# Patient Record
Sex: Male | Born: 1950
Health system: Southern US, Community
[De-identification: ages and names within clinical notes are randomized; demographics above are authoritative.]

## PROBLEM LIST (undated history)

## (undated) DIAGNOSIS — C61 Malignant neoplasm of prostate: Secondary | ICD-10-CM

## (undated) DIAGNOSIS — C7A8 Other malignant neuroendocrine tumors: Secondary | ICD-10-CM

## (undated) DIAGNOSIS — R931 Abnormal findings on diagnostic imaging of heart and coronary circulation: Secondary | ICD-10-CM

## (undated) DIAGNOSIS — I219 Acute myocardial infarction, unspecified: Secondary | ICD-10-CM

## (undated) DIAGNOSIS — C7B8 Other secondary neuroendocrine tumors: Secondary | ICD-10-CM

## (undated) DIAGNOSIS — I2699 Other pulmonary embolism without acute cor pulmonale: Secondary | ICD-10-CM

## (undated) DIAGNOSIS — E119 Type 2 diabetes mellitus without complications: Secondary | ICD-10-CM

## (undated) DIAGNOSIS — I119 Hypertensive heart disease without heart failure: Secondary | ICD-10-CM

## (undated) DIAGNOSIS — E785 Hyperlipidemia, unspecified: Secondary | ICD-10-CM

## (undated) DIAGNOSIS — E669 Obesity, unspecified: Secondary | ICD-10-CM

## (undated) DIAGNOSIS — K219 Gastro-esophageal reflux disease without esophagitis: Secondary | ICD-10-CM

## (undated) DIAGNOSIS — I493 Ventricular premature depolarization: Secondary | ICD-10-CM

## (undated) DIAGNOSIS — D696 Thrombocytopenia, unspecified: Secondary | ICD-10-CM

## (undated) DIAGNOSIS — I2510 Atherosclerotic heart disease of native coronary artery without angina pectoris: Secondary | ICD-10-CM

## (undated) DIAGNOSIS — Z91018 Allergy to other foods: Secondary | ICD-10-CM

## (undated) DIAGNOSIS — I739 Peripheral vascular disease, unspecified: Secondary | ICD-10-CM

## (undated) HISTORY — PX: APPENDECTOMY: SHX54

## (undated) HISTORY — DX: Hyperlipidemia, unspecified: E78.5

## (undated) HISTORY — PX: TRANSPERINEAL IMPLANT OF RADIATION SEEDS W/ ULTRASOUND: SUR1381

## (undated) HISTORY — DX: Acute myocardial infarction, unspecified: I21.9

## (undated) HISTORY — DX: Ventricular premature depolarization: I49.3

## (undated) HISTORY — DX: Allergy to other foods: Z91.018

## (undated) HISTORY — DX: Obesity, unspecified: E66.9

## (undated) HISTORY — DX: Abnormal findings on diagnostic imaging of heart and coronary circulation: R93.1

## (undated) HISTORY — DX: Peripheral vascular disease, unspecified: I73.9

## (undated) HISTORY — DX: Malignant neoplasm of prostate: C61

## (undated) HISTORY — DX: Type 2 diabetes mellitus without complications: E11.9

## (undated) HISTORY — DX: Hypertensive heart disease without heart failure: I11.9

## (undated) HISTORY — DX: Thrombocytopenia, unspecified: D69.6

---

## 1991-06-27 DIAGNOSIS — I219 Acute myocardial infarction, unspecified: Secondary | ICD-10-CM

## 1991-06-27 HISTORY — DX: Acute myocardial infarction, unspecified: I21.9

## 2010-07-12 ENCOUNTER — Ambulatory Visit
Admission: RE | Admit: 2010-07-12 | Discharge: 2010-07-25 | Payer: Self-pay | Source: Home / Self Care | Attending: Radiation Oncology | Admitting: Radiation Oncology

## 2010-07-27 ENCOUNTER — Ambulatory Visit: Payer: BC Managed Care – PPO | Admitting: Radiation Oncology

## 2010-08-15 ENCOUNTER — Ambulatory Visit: Payer: BC Managed Care – PPO | Attending: Radiation Oncology | Admitting: Radiation Oncology

## 2010-08-15 DIAGNOSIS — C61 Malignant neoplasm of prostate: Secondary | ICD-10-CM | POA: Insufficient documentation

## 2010-08-15 DIAGNOSIS — Z510 Encounter for antineoplastic radiation therapy: Secondary | ICD-10-CM | POA: Insufficient documentation

## 2010-09-02 ENCOUNTER — Other Ambulatory Visit: Payer: Self-pay | Admitting: Urology

## 2010-09-02 ENCOUNTER — Ambulatory Visit
Admission: RE | Admit: 2010-09-02 | Discharge: 2010-09-02 | Disposition: A | Discharge status: Home / Self Care | Payer: BC Managed Care – PPO | Source: Ambulatory Visit | Attending: Urology | Admitting: Urology

## 2010-09-02 DIAGNOSIS — Z01811 Encounter for preprocedural respiratory examination: Secondary | ICD-10-CM

## 2010-09-02 DIAGNOSIS — C61 Malignant neoplasm of prostate: Secondary | ICD-10-CM

## 2010-10-03 LAB — PROTIME-INR
INR: 0.93 (ref 0.00–1.49)
Prothrombin Time: 12.7 seconds (ref 11.6–15.2)

## 2010-10-03 LAB — COMPREHENSIVE METABOLIC PANEL
ALT: 33 U/L (ref 0–53)
BUN: 12 mg/dL (ref 6–23)
CO2: 25 mEq/L (ref 19–32)
Calcium: 9.2 mg/dL (ref 8.4–10.5)
Creatinine, Ser: 0.9 mg/dL (ref 0.4–1.5)
GFR calc non Af Amer: >60 mL/min (ref >60)
Glucose, Bld: 236 mg/dL — ABNORMAL HIGH (ref 70–99)
Sodium: 137 mEq/L (ref 135–145)
Total Protein: 6.2 g/dL (ref 6.0–8.3)

## 2010-10-03 LAB — CBC
Hemoglobin: 15.2 g/dL (ref 13.0–17.0)
MCHC: 35.6 g/dL (ref 30.0–36.0)
Platelets: 133 10*3/uL — ABNORMAL LOW (ref 150–400)
RBC: 4.94 MIL/uL (ref 4.22–5.81)

## 2010-10-10 ENCOUNTER — Ambulatory Visit (HOSPITAL_BASED_OUTPATIENT_CLINIC_OR_DEPARTMENT_OTHER)
Admission: RE | Admit: 2010-10-10 | Discharge: 2010-10-10 | Disposition: A | Discharge status: Home / Self Care | Payer: BC Managed Care – PPO | Source: Ambulatory Visit | Attending: Urology | Admitting: Urology

## 2010-10-10 ENCOUNTER — Ambulatory Visit (HOSPITAL_COMMUNITY): Payer: BC Managed Care – PPO | Attending: Urology

## 2010-10-10 DIAGNOSIS — Z79899 Other long term (current) drug therapy: Secondary | ICD-10-CM | POA: Insufficient documentation

## 2010-10-10 DIAGNOSIS — I2510 Atherosclerotic heart disease of native coronary artery without angina pectoris: Secondary | ICD-10-CM | POA: Insufficient documentation

## 2010-10-10 DIAGNOSIS — E119 Type 2 diabetes mellitus without complications: Secondary | ICD-10-CM | POA: Insufficient documentation

## 2010-10-10 DIAGNOSIS — Z01812 Encounter for preprocedural laboratory examination: Secondary | ICD-10-CM | POA: Insufficient documentation

## 2010-10-10 DIAGNOSIS — C61 Malignant neoplasm of prostate: Secondary | ICD-10-CM | POA: Insufficient documentation

## 2010-10-10 DIAGNOSIS — I10 Essential (primary) hypertension: Secondary | ICD-10-CM | POA: Insufficient documentation

## 2010-10-10 LAB — GLUCOSE, CAPILLARY: Glucose-Capillary: 252 mg/dL — ABNORMAL HIGH (ref 70–99)

## 2010-10-19 ENCOUNTER — Ambulatory Visit: Payer: BC Managed Care – PPO | Attending: Radiation Oncology | Admitting: Radiation Oncology

## 2010-10-19 DIAGNOSIS — C61 Malignant neoplasm of prostate: Secondary | ICD-10-CM | POA: Insufficient documentation

## 2010-10-19 NOTE — Op Note (Signed)
NAME:  Daniel Gibson, DUCE NO.:  [id number]  MEDICAL RECORD NO.:  [id number]           PATIENT TYPE:  LOCATION:                                 FACILITY:  PHYSICIAN:  Valetta Fuller, M.D.  DATE OF BIRTH:  30-Mar-1951  DATE OF PROCEDURE: DATE OF DISCHARGE:                              OPERATIVE REPORT   PREOPERATIVE DIAGNOSIS:  Adenocarcinoma of the prostate.  POSTOPERATIVE DIAGNOSIS:  Adenocarcinoma of the prostate.  PROCEDURE PERFORMED: 1. Robotic nuclear drawn I-125 prostate seed implantation. 2. Flexible cystoscopy.  SURGEONS:  Valetta Fuller, M.D. and Maryln Gottron, M.D.  ANESTHESIA:  General.  INDICATIONS:  Mr. Fussell is 60 years of age.  The patient was diagnosed with what was felt to be an intermediate risk to favorable clinical stage T1c adenocarcinoma of the prostate.  The patient's PSA was 4.4. Rectal exam was unremarkable.  Ultrasound revealed a 50-gm prostate.  On the right side, 6 out of 6 biopsies were positive for Gleason 3+3 equals 6 prostate cancer.  Core involvement was in the 50% to 70% range.  The patient's biopsies on the left side were all negative.  The patient underwent extensive consultation with Dr. Dayton Scrape with regard to treatment options.  The patient elected to proceed with external beam radiation approach.  The patient was given Deborra Medina for prostate downsizing given his 50-gm prostate because of some concerns about his pubic arch.  The patient has been treated with external ring radiation therapy and presents today for seed boost.  The patient has had placement of compression boots and has received perioperative ciprofloxacin.  The patient appears to understand the advantages and disadvantages of this surgery and full informed consent has been obtained.  TECHNIQUE AND FINDINGS:  The patient was brought to the operating room where he had successful induction of general anesthesia.  He was placed in lithotomy position and  was under the initial care of the Radiation Oncology Department.  Foley catheter was inserted.  Transrectal ultrasound probe placed with an anchoring stand and then anchoring needles.  Real time contouring of the prostate, urethra and rectum were then performed and a real time dose established.  We then came in the operating room and additional appropriate surgical time-out was performed.  All needle passage was done with real time sagittal transrectal ultrasound imaging.  A total of 23 needles were inserted.  Seed implantation was then done via the nuclear drawn robotic seed implanter device without incident.  A total number of 67 activated seeds was implanted with target dose of 110 gray.  At the completion of the procedure, fluoroscopic images appeared to show a nice distribution of the seeds without any obvious problems.  The Foley catheter was removed and flexible cystoscopy was performed.  We saw no evidence of seeds within the prostatic urethra or bladder.  A new Foley catheter was inserted which drained clear urine.  The patient was brought to recovery room, having had no obvious complications or problems.     Valetta Fuller, M.D.     DSG/MEDQ  D:  10/10/2010  T:  10/10/2010  Job:  [id number]  Electronically Signed by Barron Alvine M.D. on 10/19/2010 11:24:22 AM

## 2010-12-13 ENCOUNTER — Other Ambulatory Visit: Payer: Self-pay | Admitting: Family Medicine

## 2010-12-13 DIAGNOSIS — R0989 Other specified symptoms and signs involving the circulatory and respiratory systems: Secondary | ICD-10-CM

## 2010-12-16 ENCOUNTER — Ambulatory Visit
Admission: RE | Admit: 2010-12-16 | Discharge: 2010-12-16 | Disposition: A | Discharge status: Home / Self Care | Payer: BC Managed Care – PPO | Source: Ambulatory Visit | Attending: Family Medicine | Admitting: Family Medicine

## 2010-12-16 DIAGNOSIS — R0989 Other specified symptoms and signs involving the circulatory and respiratory systems: Secondary | ICD-10-CM

## 2011-11-24 ENCOUNTER — Other Ambulatory Visit: Payer: Self-pay | Admitting: Family Medicine

## 2011-11-24 DIAGNOSIS — I6529 Occlusion and stenosis of unspecified carotid artery: Secondary | ICD-10-CM

## 2011-11-27 ENCOUNTER — Other Ambulatory Visit: Payer: BC Managed Care – PPO

## 2012-03-28 ENCOUNTER — Ambulatory Visit
Admission: RE | Admit: 2012-03-28 | Discharge: 2012-03-28 | Disposition: A | Discharge status: Home / Self Care | Payer: No Typology Code available for payment source | Source: Ambulatory Visit | Attending: Family Medicine | Admitting: Family Medicine

## 2012-03-28 DIAGNOSIS — I6529 Occlusion and stenosis of unspecified carotid artery: Secondary | ICD-10-CM

## 2012-04-09 ENCOUNTER — Other Ambulatory Visit: Payer: Self-pay

## 2012-04-09 DIAGNOSIS — I6529 Occlusion and stenosis of unspecified carotid artery: Secondary | ICD-10-CM

## 2012-04-26 ENCOUNTER — Encounter: Payer: Self-pay | Admitting: Vascular Surgery

## 2012-05-07 ENCOUNTER — Encounter: Payer: Self-pay | Admitting: Vascular Surgery

## 2012-05-08 ENCOUNTER — Other Ambulatory Visit (INDEPENDENT_AMBULATORY_CARE_PROVIDER_SITE_OTHER): Payer: PRIVATE HEALTH INSURANCE | Admitting: *Deleted

## 2012-05-08 ENCOUNTER — Ambulatory Visit (INDEPENDENT_AMBULATORY_CARE_PROVIDER_SITE_OTHER): Payer: PRIVATE HEALTH INSURANCE | Admitting: Vascular Surgery

## 2012-05-08 ENCOUNTER — Encounter: Payer: Self-pay | Admitting: Vascular Surgery

## 2012-05-08 VITALS — BP 167/86 | HR 66 | Ht 70.0 in | Wt 224.0 lb

## 2012-05-08 DIAGNOSIS — I6529 Occlusion and stenosis of unspecified carotid artery: Secondary | ICD-10-CM

## 2012-05-08 DIAGNOSIS — I779 Disorder of arteries and arterioles, unspecified: Secondary | ICD-10-CM | POA: Insufficient documentation

## 2012-05-08 HISTORY — DX: Disorder of arteries and arterioles, unspecified: I77.9

## 2012-05-08 NOTE — Assessment & Plan Note (Signed)
This patient has a greater than 80% asymptomatic right carotid stenosis. I have recommended right carotid endarterectomy in order to lower his risk of future stroke. I have reviewed the indications for carotid endarterectomy.  I have also reviewed the potential complications of surgery, including but not limited to: bleeding, stroke (perioperative risk 1-2%), MI, nerve injury of other unpredictable medical problems. All of the patients questions were answered and they are agreeable to proceed with surgery. The patient is on a statin and is also on aspirin. The surgery is scheduled for 06/07/2012.

## 2012-05-08 NOTE — Progress Notes (Signed)
[facility]  Patient name: Daniel Gibson MRN: [id number] DOB: 10/29/1950 Sex: male  CC: asymptomatic greater than 80% right carotid stenosis. Referred by Dr. Maura Hamrick  HPI: Daniel Gibson is a 61 y.o. male who was found to have a right carotid stenosis on a routine carotid duplex scan approximately 1 year ago.he had a follow up study by [facility] on 03/28/2012. This showed progression of the right carotid stenosis which was now in the 70-99% range with a less than 50% left carotid stenosis. He was sent for vascular consultation. He is right-handed. He denies any history of stroke, TIAs, expressive or receptive aphasia, or amaurosis fugax. He is on aspirin and also a statin. He has a strong family history of cerebral vascular disease.  Past Medical History  Diagnosis Date  . Diabetes mellitus without complication   . Carotid artery occlusion   . Cancer     prostate  . CAD (coronary artery disease)   . Hyperlipidemia   . Hypertension   . Thrombocytopenia   . Myocardial infarction     Family History  Problem Relation Age of Onset  . Cancer Mother   . Heart attack Father   . Diabetes Sister   . Heart disease Sister     before age 60  . Hypertension Sister   . Diabetes Brother   . Heart disease Brother     before age 60  . Hyperlipidemia Brother   . Hypertension Brother     SOCIAL HISTORY: History  Substance Use Topics  . Smoking status: Never Smoker   . Smokeless tobacco: Never Used  . Alcohol Use: No    Allergies  Allergen Reactions  . Tetanus Toxoids     Current Outpatient Prescriptions  Medication Sig Dispense Refill  . amLODipine (NORVASC) 10 MG tablet Take 10 mg by mouth daily.      . aspirin 325 MG tablet Take 325 mg by mouth daily.      . carvedilol (COREG) 25 MG tablet Take 12.5 mg by mouth 2 (two) times daily with a meal.      . fluocinonide (LIDEX) 0.05 % external solution Apply topically 2 (two) times daily.  When itching      . metFORMIN (GLUCOPHAGE) 1000 MG tablet Take 1,000 mg by mouth 2 (two) times daily with a meal.      . Niacin-Simvastatin 1000-40 MG TB24 Take by mouth.      . nitroGLYCERIN (NITROSTAT) 0.4 MG SL tablet Place 0.4 mg under the tongue every 5 (five) minutes as needed.      . omega-3 acid ethyl esters (LOVAZA) 1 G capsule Take 2 g by mouth 2 (two) times daily.      . oxybutynin (DITROPAN-XL) 10 MG 24 hr tablet Take 10 mg by mouth daily.      . ramipril (ALTACE) 10 MG capsule Take 10 mg by mouth daily.      . zolpidem (AMBIEN) 10 MG tablet Take 10 mg by mouth at bedtime as needed.        REVIEW OF SYSTEMS: [X ] denotes positive finding; [  ] denotes negative finding CARDIOVASCULAR:  [ ] chest pain   [ ] chest pressure   [ ] palpitations   [ ] orthopnea   [ ] dyspnea on exertion   [ ] claudication   [ ] rest pain   [ ] DVT   [ ] phlebitis PULMONARY:   [ ] productive cough   [ ]   asthma   [ ] wheezing NEUROLOGIC:   [ ] weakness  [ ] paresthesias  [ ] aphasia  [ ] amaurosis  [ ] dizziness HEMATOLOGIC:   [ ] bleeding problems   [ ] clotting disorders MUSCULOSKELETAL:  [ ] joint pain   [ ] joint swelling [ ] leg swelling GASTROINTESTINAL: [ ]  blood in stool  [ ]  hematemesis GENITOURINARY:  [ ]  dysuria  [ ]  hematuria PSYCHIATRIC:  [ ] history of major depression INTEGUMENTARY:  [ ] rashes  [ ] ulcers CONSTITUTIONAL:  [ ] fever   [ ] chills  PHYSICAL EXAM: Filed Vitals:   05/08/12 0926  BP: 167/86  Pulse: 66  Height: 5' 10" (1.778 m)  Weight: 224 lb (101.606 kg)  SpO2: 100%   Body mass index is 32.14 kg/(m^2). GENERAL: The patient is a well-nourished male, in no acute distress. The vital signs are documented above. CARDIOVASCULAR: There is a regular rate and rhythm without significant murmur appreciated. He has a right carotid bruit. He has palpable femoral, popliteal, and pedal pulses bilaterally. He has no significant lower extremity swelling. PULMONARY: There is good  air exchange bilaterally without wheezing or rales. ABDOMEN: Soft and non-tender with normal pitched bowel sounds. I do not appreciate an abdominal aortic aneurysm although his abdomen is obese and difficult to assess. MUSCULOSKELETAL: There are no major deformities or cyanosis. NEUROLOGIC: No focal weakness or paresthesias are detected. SKIN: There are no ulcers or rashes noted. PSYCHIATRIC: The patient has a normal affect.  DATA:  I have reviewed his carotid duplex scan from [facility] which shows a 70-99% right carotid stenosis with a less than 50% left carotid stenosis. There is normal he directed flow in both vertebral arteries.  I [facility]. He has type 2 diabetes without any significant complicating features. He has benign essential hypertension and hyperlipidemia. In addition he has a history of thrombocytopenia.  I have independently interpreted her carotid duplex scan of the right carotid artery in our office which shows a greater than 80% right carotid stenosis. The ICA be on the stenosis is tortuous but no disease is noted.  MEDICAL ISSUES:  Occlusion and stenosis of carotid artery without mention of cerebral infarction This patient has a greater than 80% asymptomatic right carotid stenosis. I have recommended right carotid endarterectomy in order to lower his risk of future stroke. I have reviewed the indications for carotid endarterectomy.  I have also reviewed the potential complications of surgery, including but not limited to: bleeding, stroke (perioperative risk 1-2%), MI, nerve injury of other unpredictable medical problems. All of the patients questions were answered and they are agreeable to proceed with surgery. The patient is on a statin and is also on aspirin. The surgery is scheduled for 06/07/2012.    Herminia Warren S [facility] Office: [phone]   

## 2012-05-17 ENCOUNTER — Other Ambulatory Visit: Payer: Self-pay | Admitting: *Deleted

## 2012-05-31 ENCOUNTER — Encounter (HOSPITAL_COMMUNITY): Payer: Self-pay

## 2012-05-31 ENCOUNTER — Ambulatory Visit (HOSPITAL_COMMUNITY)
Admission: RE | Admit: 2012-05-31 | Discharge: 2012-05-31 | Disposition: A | Discharge status: Home / Self Care | Payer: PRIVATE HEALTH INSURANCE | Source: Ambulatory Visit | Attending: Anesthesiology | Admitting: Anesthesiology

## 2012-05-31 ENCOUNTER — Encounter (HOSPITAL_COMMUNITY)
Admission: RE | Admit: 2012-05-31 | Discharge: 2012-05-31 | Disposition: A | Discharge status: Home / Self Care | Payer: PRIVATE HEALTH INSURANCE | Source: Ambulatory Visit | Attending: Vascular Surgery | Admitting: Vascular Surgery

## 2012-05-31 ENCOUNTER — Encounter (HOSPITAL_COMMUNITY): Payer: Self-pay | Admitting: Pharmacy Technician

## 2012-05-31 DIAGNOSIS — Z01818 Encounter for other preprocedural examination: Secondary | ICD-10-CM | POA: Insufficient documentation

## 2012-05-31 LAB — CBC
HCT: 41.6 % (ref 39.0–52.0)
Hemoglobin: 15.4 g/dL (ref 13.0–17.0)
MCV: 85.8 fL (ref 78.0–100.0)
RBC: 4.85 MIL/uL (ref 4.22–5.81)
RDW: 12.5 % (ref 11.5–15.5)
WBC: 8.3 10*3/uL (ref 4.0–10.5)

## 2012-05-31 LAB — URINALYSIS, ROUTINE W REFLEX MICROSCOPIC
Glucose, UA: NEGATIVE mg/dL
Hgb urine dipstick: NEGATIVE
Protein, ur: NEGATIVE mg/dL
Urobilinogen, UA: 1 mg/dL (ref 0.0–1.0)

## 2012-05-31 LAB — COMPREHENSIVE METABOLIC PANEL
Albumin: 4.1 g/dL (ref 3.5–5.2)
Alkaline Phosphatase: 66 U/L (ref 39–117)
BUN: 11 mg/dL (ref 6–23)
CO2: 24 mEq/L (ref 19–32)
Chloride: 106 mEq/L (ref 96–112)
Creatinine, Ser: 0.88 mg/dL (ref 0.50–1.35)
GFR calc non Af Amer: >90 mL/min (ref >90)
Glucose, Bld: 113 mg/dL — ABNORMAL HIGH (ref 70–99)
Potassium: 3.8 mEq/L (ref 3.5–5.1)
Total Bilirubin: 0.6 mg/dL (ref 0.3–1.2)

## 2012-05-31 LAB — ABO/RH: ABO/RH(D): A POS

## 2012-05-31 LAB — APTT: aPTT: 26 seconds (ref 24–37)

## 2012-05-31 LAB — TYPE AND SCREEN
ABO/RH(D): A POS
Antibody Screen: NEGATIVE

## 2012-05-31 LAB — SURGICAL PCR SCREEN: Staphylococcus aureus: NEGATIVE

## 2012-05-31 NOTE — Progress Notes (Signed)
Requested last OV, ECHO and stress test if available, from Dr. York Spaniel office.

## 2012-05-31 NOTE — Progress Notes (Signed)
05/31/12 1326  OBSTRUCTIVE SLEEP APNEA  Have you ever been diagnosed with sleep apnea through a sleep study? No  Do you snore loudly (loud enough to be heard through closed doors)?  0  Do you often feel tired, fatigued, or sleepy during the daytime? 0  Has anyone observed you stop breathing during your sleep? 1  Do you have, or are you being treated for high blood pressure? 1  BMI more than 35 kg/m2? 0  Age over 60 years old? 1  Neck circumference greater than 40 cm/18 inches? 0  Gender: 1  Obstructive Sleep Apnea Score 4

## 2012-05-31 NOTE — Pre-Procedure Instructions (Signed)
[address] Vancott  05/31/2012   Your procedure is scheduled on:  Friday December 13  Report to [facility] at 5:30 AM.  Call this number if you have problems the morning of surgery: [phone]   Remember:   Do not eat or drink:After Midnight.    Take these medicines the morning of surgery with A SIP OF WATER: Amlodipine (Norvasc), Carvedilol (Coreg), Oxybutynin (Ditropan). Bring Nitro to surgery.   Do not wear jewelry, make-up or nail polish.  Do not wear lotions, powders, or perfumes. You may wear deodorant.  Do not shave 48 hours prior to surgery. Men may shave face and neck.  Do not bring valuables to the hospital.  Contacts, dentures or bridgework may not be worn into surgery.  Leave suitcase in the car. After surgery it may be brought to your room.  For patients admitted to the hospital, checkout time is 11:00 AM the day of discharge.   Patients discharged the day of surgery will not be allowed to drive home.  Name and phone number of your driver: NA  Special Instructions: Shower using CHG 2 nights before surgery and the night before surgery.  If you shower the day of surgery use CHG.  Use special wash - you have one bottle of CHG for all showers.  You should use approximately 1/3 of the bottle for each shower.   Please read over the following fact sheets that you were given: Pain Booklet, Coughing and Deep Breathing, Blood Transfusion Information and Surgical Site Infection Prevention

## 2012-06-03 NOTE — Consult Note (Signed)
Anesthesia chart review: Patient is a 61 year old male scheduled for right carotid endarterectomy by Dr. Edilia Bo on 06/07/2012. History includes diabetes mellitus type 2, hyperlipidemia, hypertension, thrombocytopenia, CAD with MI '93 managed medically, prostate cancer status post radioactive seed implant 09/2010. PCP is Dr. Burnell Blanks.  Preoperative labs noted. Platelet count 129 (stable since 10/03/2010).  Chest x-ray in the summer 12/14/2011 showed no active cardiopulmonary disease.  EKG on 05/31/2012 showed normal sinus rhythm.  Records reviewed from Cardiologist Dr. Donnie Aho.  Last records are from 09/2010.  He had a negative adequate exercise test with no evidence of myocardial ischemia on 10/18/2010, achieved 9 METS.  His EKG is WNL, and he had a normal ETT within the past two years.  If no new CV symptoms then anticipate he can proceed as planned.  Anesthesiologist Dr. Noreene Larsson agrees with this plan.  Shonna Chock, PA-C 06/03/12 1418

## 2012-06-06 ENCOUNTER — Other Ambulatory Visit: Payer: Self-pay

## 2012-06-06 MED ORDER — DEXTROSE 5 % IV SOLN
1.5000 g | INTRAVENOUS | Status: AC
  Administered 2012-06-07: 1.5 g via INTRAVENOUS
  Filled 2012-06-06: qty 1.5

## 2012-06-07 ENCOUNTER — Encounter (HOSPITAL_COMMUNITY): Payer: Self-pay | Admitting: Vascular Surgery

## 2012-06-07 ENCOUNTER — Inpatient Hospital Stay (HOSPITAL_COMMUNITY)
Admission: RE | Admit: 2012-06-07 | Discharge: 2012-06-08 | DRG: 039 | Disposition: A | Discharge status: Home / Self Care | Payer: PRIVATE HEALTH INSURANCE | Source: Ambulatory Visit | Attending: Vascular Surgery | Admitting: Vascular Surgery

## 2012-06-07 ENCOUNTER — Ambulatory Visit (HOSPITAL_COMMUNITY): Payer: PRIVATE HEALTH INSURANCE | Admitting: Vascular Surgery

## 2012-06-07 ENCOUNTER — Encounter (HOSPITAL_COMMUNITY)
Admission: RE | Disposition: A | Discharge status: Home / Self Care | Payer: Self-pay | Source: Ambulatory Visit | Attending: Vascular Surgery

## 2012-06-07 ENCOUNTER — Telehealth: Payer: Self-pay | Admitting: Vascular Surgery

## 2012-06-07 ENCOUNTER — Encounter (HOSPITAL_COMMUNITY): Payer: Self-pay

## 2012-06-07 DIAGNOSIS — Z7982 Long term (current) use of aspirin: Secondary | ICD-10-CM

## 2012-06-07 DIAGNOSIS — I6529 Occlusion and stenosis of unspecified carotid artery: Secondary | ICD-10-CM

## 2012-06-07 DIAGNOSIS — I739 Peripheral vascular disease, unspecified: Secondary | ICD-10-CM | POA: Diagnosis present

## 2012-06-07 DIAGNOSIS — Z8546 Personal history of malignant neoplasm of prostate: Secondary | ICD-10-CM

## 2012-06-07 DIAGNOSIS — Z79899 Other long term (current) drug therapy: Secondary | ICD-10-CM

## 2012-06-07 DIAGNOSIS — I2510 Atherosclerotic heart disease of native coronary artery without angina pectoris: Secondary | ICD-10-CM | POA: Diagnosis present

## 2012-06-07 DIAGNOSIS — I10 Essential (primary) hypertension: Secondary | ICD-10-CM | POA: Diagnosis present

## 2012-06-07 DIAGNOSIS — E119 Type 2 diabetes mellitus without complications: Secondary | ICD-10-CM | POA: Diagnosis present

## 2012-06-07 DIAGNOSIS — I252 Old myocardial infarction: Secondary | ICD-10-CM

## 2012-06-07 HISTORY — PX: ENDARTERECTOMY: SHX5162

## 2012-06-07 HISTORY — PX: CAROTID ENDARTERECTOMY: SUR193

## 2012-06-07 LAB — GLUCOSE, CAPILLARY
Glucose-Capillary: 176 mg/dL — ABNORMAL HIGH (ref 70–99)
Glucose-Capillary: 256 mg/dL — ABNORMAL HIGH (ref 70–99)

## 2012-06-07 SURGERY — ENDARTERECTOMY, CAROTID
Anesthesia: General | Site: Neck | Laterality: Right | Wound class: Clean

## 2012-06-07 MED ORDER — NEOSTIGMINE METHYLSULFATE 1 MG/ML IJ SOLN
INTRAMUSCULAR | Status: DC | PRN
  Administered 2012-06-07: 2 mg via INTRAVENOUS

## 2012-06-07 MED ORDER — FENTANYL CITRATE 0.05 MG/ML IJ SOLN
INTRAMUSCULAR | Status: AC
  Filled 2012-06-07: qty 2

## 2012-06-07 MED ORDER — EPHEDRINE SULFATE 50 MG/ML IJ SOLN
INTRAMUSCULAR | Status: DC | PRN
  Administered 2012-06-07 (×2): 2.5 mg via INTRAVENOUS

## 2012-06-07 MED ORDER — DOCUSATE SODIUM 100 MG PO CAPS
100.0000 mg | ORAL_CAPSULE | Freq: Every day | ORAL | Status: DC
  Administered 2012-06-08: 100 mg via ORAL
  Filled 2012-06-07: qty 1

## 2012-06-07 MED ORDER — PHENOL 1.4 % MT LIQD: 1.0000 | OROMUCOSAL | Status: DC | PRN

## 2012-06-07 MED ORDER — LABETALOL HCL 5 MG/ML IV SOLN: 10.0000 mg | INTRAVENOUS | Status: DC | PRN

## 2012-06-07 MED ORDER — SODIUM CHLORIDE 0.9 % IV SOLN: 500.0000 mL | Freq: Once | INTRAVENOUS | Status: AC | PRN

## 2012-06-07 MED ORDER — LIDOCAINE HCL (CARDIAC) 20 MG/ML IV SOLN
INTRAVENOUS | Status: DC | PRN
  Administered 2012-06-07: 100 mg via INTRAVENOUS

## 2012-06-07 MED ORDER — PROTAMINE SULFATE 10 MG/ML IV SOLN
INTRAVENOUS | Status: DC | PRN
  Administered 2012-06-07: 40 mg via INTRAVENOUS

## 2012-06-07 MED ORDER — ROCURONIUM BROMIDE 100 MG/10ML IV SOLN
INTRAVENOUS | Status: DC | PRN
  Administered 2012-06-07: 50 mg via INTRAVENOUS

## 2012-06-07 MED ORDER — PANTOPRAZOLE SODIUM 40 MG PO TBEC
40.0000 mg | DELAYED_RELEASE_TABLET | Freq: Every day | ORAL | Status: DC
  Administered 2012-06-08: 40 mg via ORAL
  Filled 2012-06-07: qty 1

## 2012-06-07 MED ORDER — MORPHINE SULFATE 2 MG/ML IJ SOLN: 2.0000 mg | INTRAMUSCULAR | Status: DC | PRN

## 2012-06-07 MED ORDER — FENTANYL CITRATE 0.05 MG/ML IJ SOLN
25.0000 ug | INTRAMUSCULAR | Status: DC | PRN
  Administered 2012-06-07 (×2): 50 ug via INTRAVENOUS

## 2012-06-07 MED ORDER — ASPIRIN 325 MG PO TABS
325.0000 mg | ORAL_TABLET | Freq: Every day | ORAL | Status: DC
  Filled 2012-06-07: qty 1

## 2012-06-07 MED ORDER — SODIUM CHLORIDE 0.9 % IV SOLN: INTRAVENOUS | Status: DC

## 2012-06-07 MED ORDER — INSULIN ASPART 100 UNIT/ML ~~LOC~~ SOLN
0.0000 [IU] | Freq: Three times a day (TID) | SUBCUTANEOUS | Status: DC
  Administered 2012-06-07: 8 [IU] via SUBCUTANEOUS
  Administered 2012-06-08: 5 [IU] via SUBCUTANEOUS

## 2012-06-07 MED ORDER — CARVEDILOL 12.5 MG PO TABS
12.5000 mg | ORAL_TABLET | Freq: Two times a day (BID) | ORAL | Status: DC
  Administered 2012-06-08: 12.5 mg via ORAL
  Filled 2012-06-07 (×4): qty 1

## 2012-06-07 MED ORDER — SODIUM CHLORIDE 0.9 % IV SOLN
INTRAVENOUS | Status: DC
  Administered 2012-06-07 (×2): via INTRAVENOUS

## 2012-06-07 MED ORDER — ACETAMINOPHEN 650 MG RE SUPP: 325.0000 mg | RECTAL | Status: DC | PRN

## 2012-06-07 MED ORDER — GUAIFENESIN-DM 100-10 MG/5ML PO SYRP: 15.0000 mL | ORAL_SOLUTION | ORAL | Status: DC | PRN

## 2012-06-07 MED ORDER — METOCLOPRAMIDE HCL 5 MG/ML IJ SOLN: 10.0000 mg | Freq: Once | INTRAMUSCULAR | Status: DC | PRN

## 2012-06-07 MED ORDER — ATORVASTATIN CALCIUM 40 MG PO TABS
40.0000 mg | ORAL_TABLET | Freq: Every day | ORAL | Status: DC
  Administered 2012-06-07: 40 mg via ORAL
  Filled 2012-06-07 (×2): qty 1

## 2012-06-07 MED ORDER — POTASSIUM CHLORIDE CRYS ER 20 MEQ PO TBCR: 20.0000 meq | EXTENDED_RELEASE_TABLET | Freq: Every day | ORAL | Status: DC | PRN

## 2012-06-07 MED ORDER — THROMBIN 20000 UNITS EX SOLR
CUTANEOUS | Status: AC
  Filled 2012-06-07: qty 20000

## 2012-06-07 MED ORDER — SODIUM CHLORIDE 0.9 % IR SOLN
Status: DC | PRN
  Administered 2012-06-07: 08:00:00

## 2012-06-07 MED ORDER — LACTATED RINGERS IV SOLN
INTRAVENOUS | Status: DC | PRN
  Administered 2012-06-07 (×2): via INTRAVENOUS

## 2012-06-07 MED ORDER — LIDOCAINE HCL (PF) 1 % IJ SOLN
INTRAMUSCULAR | Status: AC
  Filled 2012-06-07: qty 30

## 2012-06-07 MED ORDER — HYDRALAZINE HCL 20 MG/ML IJ SOLN: 10.0000 mg | INTRAMUSCULAR | Status: DC | PRN

## 2012-06-07 MED ORDER — DOPAMINE-DEXTROSE 3.2-5 MG/ML-% IV SOLN: 3.0000 ug/kg/min | INTRAVENOUS | Status: DC | PRN

## 2012-06-07 MED ORDER — ALUM & MAG HYDROXIDE-SIMETH 200-200-20 MG/5ML PO SUSP: 15.0000 mL | ORAL | Status: DC | PRN

## 2012-06-07 MED ORDER — 0.9 % SODIUM CHLORIDE (POUR BTL) OPTIME
TOPICAL | Status: DC | PRN
  Administered 2012-06-07: 2000 mL

## 2012-06-07 MED ORDER — ONDANSETRON HCL 4 MG/2ML IJ SOLN: 4.0000 mg | Freq: Four times a day (QID) | INTRAMUSCULAR | Status: DC | PRN

## 2012-06-07 MED ORDER — OXYCODONE-ACETAMINOPHEN 5-325 MG PO TABS: 1.0000 | ORAL_TABLET | ORAL | Status: DC | PRN

## 2012-06-07 MED ORDER — PROPOFOL 10 MG/ML IV BOLUS
INTRAVENOUS | Status: DC | PRN
  Administered 2012-06-07: 200 mg via INTRAVENOUS

## 2012-06-07 MED ORDER — METOPROLOL TARTRATE 1 MG/ML IV SOLN: 2.0000 mg | INTRAVENOUS | Status: DC | PRN

## 2012-06-07 MED ORDER — LIDOCAINE-EPINEPHRINE (PF) 1 %-1:200000 IJ SOLN: INTRAMUSCULAR | Status: DC | PRN

## 2012-06-07 MED ORDER — AMLODIPINE BESYLATE 10 MG PO TABS
10.0000 mg | ORAL_TABLET | Freq: Every day | ORAL | Status: DC
  Administered 2012-06-08: 10 mg via ORAL
  Filled 2012-06-07: qty 1

## 2012-06-07 MED ORDER — FENTANYL CITRATE 0.05 MG/ML IJ SOLN
INTRAMUSCULAR | Status: DC | PRN
  Administered 2012-06-07 (×2): 25 ug via INTRAVENOUS
  Administered 2012-06-07: 50 ug via INTRAVENOUS

## 2012-06-07 MED ORDER — DEXTROSE 5 % IV SOLN
1.5000 g | Freq: Two times a day (BID) | INTRAVENOUS | Status: DC
  Administered 2012-06-07: 1.5 g via INTRAVENOUS
  Filled 2012-06-07 (×2): qty 1.5

## 2012-06-07 MED ORDER — MIDAZOLAM HCL 5 MG/5ML IJ SOLN
INTRAMUSCULAR | Status: DC | PRN
  Administered 2012-06-07: 1 mg via INTRAVENOUS

## 2012-06-07 MED ORDER — DEXTRAN 40 IN SALINE 10-0.9 % IV SOLN
INTRAVENOUS | Status: AC
  Filled 2012-06-07: qty 500

## 2012-06-07 MED ORDER — ONDANSETRON HCL 4 MG/2ML IJ SOLN
INTRAMUSCULAR | Status: DC | PRN
  Administered 2012-06-07 (×2): 4 mg via INTRAVENOUS

## 2012-06-07 MED ORDER — DEXAMETHASONE SODIUM PHOSPHATE 4 MG/ML IJ SOLN
INTRAMUSCULAR | Status: DC | PRN
  Administered 2012-06-07: 8 mg via INTRAVENOUS

## 2012-06-07 MED ORDER — GLYCOPYRROLATE 0.2 MG/ML IJ SOLN
INTRAMUSCULAR | Status: DC | PRN
  Administered 2012-06-07: .3 mg via INTRAVENOUS

## 2012-06-07 MED ORDER — SODIUM CHLORIDE 0.9 % IV SOLN
10.0000 mg | INTRAVENOUS | Status: DC | PRN
  Administered 2012-06-07: 5 ug/min via INTRAVENOUS

## 2012-06-07 MED ORDER — RAMIPRIL 10 MG PO CAPS
10.0000 mg | ORAL_CAPSULE | Freq: Every day | ORAL | Status: DC
  Administered 2012-06-08: 10 mg via ORAL
  Filled 2012-06-07: qty 1

## 2012-06-07 MED ORDER — HEPARIN SODIUM (PORCINE) 1000 UNIT/ML IJ SOLN
INTRAMUSCULAR | Status: DC | PRN
  Administered 2012-06-07: 9000 [IU] via INTRAVENOUS

## 2012-06-07 MED ORDER — LIDOCAINE HCL (PF) 1 % IJ SOLN
INTRAMUSCULAR | Status: DC | PRN
  Administered 2012-06-07: 1 mL
  Administered 2012-06-07: 8 mL

## 2012-06-07 MED ORDER — ACETAMINOPHEN 325 MG PO TABS: 325.0000 mg | ORAL_TABLET | ORAL | Status: DC | PRN

## 2012-06-07 SURGICAL SUPPLY — 49 items
BAG DECANTER FOR FLEXI CONT (MISCELLANEOUS) ×2 IMPLANT
CANISTER SUCTION 2500CC (MISCELLANEOUS) ×2 IMPLANT
CANNULA VESSEL W/WING WO/VALVE (CANNULA) ×2 IMPLANT
CATH ROBINSON RED A/P 18FR (CATHETERS) ×2 IMPLANT
CLIP TI MEDIUM 24 (CLIP) ×2 IMPLANT
CLIP TI WIDE RED SMALL 24 (CLIP) ×2 IMPLANT
CLOTH BEACON ORANGE TIMEOUT ST (SAFETY) ×2 IMPLANT
COVER SURGICAL LIGHT HANDLE (MISCELLANEOUS) ×2 IMPLANT
CRADLE DONUT ADULT HEAD (MISCELLANEOUS) ×2 IMPLANT
DERMABOND ADHESIVE PROPEN (GAUZE/BANDAGES/DRESSINGS) ×1
DERMABOND ADVANCED (GAUZE/BANDAGES/DRESSINGS) ×1
DERMABOND ADVANCED .7 DNX12 (GAUZE/BANDAGES/DRESSINGS) ×1 IMPLANT
DERMABOND ADVANCED .7 DNX6 (GAUZE/BANDAGES/DRESSINGS) ×1 IMPLANT
DRAIN CHANNEL 15F RND FF W/TCR (WOUND CARE) IMPLANT
DRAPE WARM FLUID 44X44 (DRAPE) ×2 IMPLANT
ELECT REM PT RETURN 9FT ADLT (ELECTROSURGICAL) ×2
ELECTRODE REM PT RTRN 9FT ADLT (ELECTROSURGICAL) ×1 IMPLANT
EVACUATOR SILICONE 100CC (DRAIN) IMPLANT
GLOVE BIO SURGEON STRL SZ 6.5 (GLOVE) ×2 IMPLANT
GLOVE BIO SURGEON STRL SZ7.5 (GLOVE) ×2 IMPLANT
GLOVE BIOGEL PI IND STRL 7.0 (GLOVE) ×4 IMPLANT
GLOVE BIOGEL PI IND STRL 8 (GLOVE) ×1 IMPLANT
GLOVE BIOGEL PI INDICATOR 7.0 (GLOVE) ×4
GLOVE BIOGEL PI INDICATOR 8 (GLOVE) ×1
GLOVE ECLIPSE 6.5 STRL STRAW (GLOVE) ×4 IMPLANT
GLOVE ECLIPSE 7.0 STRL STRAW (GLOVE) ×2 IMPLANT
GOWN PREVENTION PLUS XXLARGE (GOWN DISPOSABLE) ×4 IMPLANT
GOWN STRL NON-REIN LRG LVL3 (GOWN DISPOSABLE) ×6 IMPLANT
KIT BASIN OR (CUSTOM PROCEDURE TRAY) ×2 IMPLANT
KIT ROOM TURNOVER OR (KITS) ×2 IMPLANT
NEEDLE HYPO 25X1 1.5 SAFETY (NEEDLE) ×2 IMPLANT
NS IRRIG 1000ML POUR BTL (IV SOLUTION) ×4 IMPLANT
PACK CAROTID (CUSTOM PROCEDURE TRAY) ×2 IMPLANT
PAD ARMBOARD 7.5X6 YLW CONV (MISCELLANEOUS) ×4 IMPLANT
PATCH HEMASHIELD 8X75 (Vascular Products) ×2 IMPLANT
SHUNT CAROTID BYPASS 10 (VASCULAR PRODUCTS) ×2 IMPLANT
SHUNT CAROTID BYPASS 12FRX15.5 (VASCULAR PRODUCTS) IMPLANT
SPECIMEN JAR SMALL (MISCELLANEOUS) ×2 IMPLANT
SPONGE SURGIFOAM ABS GEL 100 (HEMOSTASIS) IMPLANT
SUT PROLENE 6 0 BV (SUTURE) ×12 IMPLANT
SUT PROLENE 7 0 BV 1 (SUTURE) IMPLANT
SUT SILK 2 0 FS (SUTURE) IMPLANT
SUT VIC AB 3-0 SH 27 (SUTURE) ×1
SUT VIC AB 3-0 SH 27X BRD (SUTURE) ×1 IMPLANT
SUT VICRYL 4-0 PS2 18IN ABS (SUTURE) ×2 IMPLANT
SYR CONTROL 10ML LL (SYRINGE) ×2 IMPLANT
TOWEL OR 17X24 6PK STRL BLUE (TOWEL DISPOSABLE) ×2 IMPLANT
TOWEL OR 17X26 10 PK STRL BLUE (TOWEL DISPOSABLE) ×2 IMPLANT
WATER STERILE IRR 1000ML POUR (IV SOLUTION) ×2 IMPLANT

## 2012-06-07 NOTE — Progress Notes (Signed)
Brief Nutrition Note:  Pt indicated no unintentional weight loss and no poor appetite PTA. "unsure amount" was indicated for amount of weight loss, this value should have been left blank generating a score of 0, not 2.   Weight hx:  Wt Readings from Last 5 Encounters:  06/07/12 221 lb 9 oz (100.5 kg)  06/07/12 221 lb 9 oz (100.5 kg)  05/31/12 220 lb 5 oz (99.933 kg)  05/08/12 224 lb (101.606 kg)    Weight has been stable   Current diet is clear liquid post op.   Chart reviewed, no nutrition interventions warranted at this time, please consult as needed.   Clarene Duke RD, LDN Pager [phone] After Hours pager [phone]

## 2012-06-07 NOTE — Transfer of Care (Signed)
Immediate Anesthesia Transfer of Care Note  Patient: Daniel Gibson  Procedure(s) Performed: Procedure(s) (LRB) with comments: ENDARTERECTOMY CAROTID (Right) - Right Carotid Endarterectomy with Patch Angioplasty  Patient Location: PACU  Anesthesia Type:General  Level of Consciousness: awake, alert  and oriented  Airway & Oxygen Therapy: Patient Spontanous Breathing and Patient connected to nasal cannula oxygen  Post-op Assessment: Report given to PACU RN and Post -op Vital signs reviewed and stable  Post vital signs: Reviewed and stable  Complications: No apparent anesthesia complications

## 2012-06-07 NOTE — Op Note (Signed)
NAME: Daniel Gibson    MRN: [id number] DOB: 04/16/1951    DATE OF OPERATION: 06/07/2012  PREOP DIAGNOSIS: asymptomatic greater than 80% right carotid stenosis  POSTOP DIAGNOSIS: same  PROCEDURE: Right carotid endarterectomy with Dacron patch angioplasty  SURGEON: Di Kindle. Edilia Bo, MD, FACS  ASSIST: Della Goo PA  ANESTHESIA: Gen.   EBL: minimal  INDICATIONS: Daniel Gibson is a 61 y.o. male who is being followed with carotid disease. The right carotid stenosis progressed to greater than 80% right carotid endarterectomy was recommended.  FINDINGS: the stenosis was 90% and extended very high up the internal carotid artery.  TECHNIQUE: The patient was brought to the operative room and received a general anesthetic. Arterial line had been placed by anesthesia. The right neck was prepped and draped in the usual sterile fashion. An incision was made along the anterior border of the sternocleidomastoid and the dissection carried down to the common carotid artery which was dissected free and controlled with a Rummel tourniquet. The facial vein was divided between 2-0 silk ties. The bifurcation was at the mid hyaloid however the plaque extended very high up the internal carotid artery. The external carotid artery and distal internal carotid artery we controlled with vessel loops and the patient was heparinized. After the heparin had circulated, clamps were placed on the internal then the common then the external carotid artery. A longitudinal arteriotomy was made in the common carotid artery and this was extended through the plaque into the internal carotid artery. A 10 shunt was placed into the internal carotid artery, backbled and then placed into the common carotid artery and secured with Rummel tourniquet. The endarterectomy plane was established proximally and the plaque was sharply divided. Eversion endarterectomy was performed of the external carotid artery. Distally the plaque extended  very high in order to provide adequate visualization to fully remove the plaque I decided I would have to remove the shunt. There was good back bleeding so I felt that this was safe. We maintained adequate pressure. The shunt was removed the arteries clamped. This allowed better exposure of the very distal internal carotid artery. Here the plaque feathered and was removed. One tacking suture was placed. The artery was irrigated with copious amounts of dextran and heparin and all loose debris removed. A Dacron patch was then sewn using continuous 6-0 Prolene suture. Prior to completing the patch closure the artery was backbled and flushed properly and the anastomosis completed. Flow was reestablished first to the external carotid artery and into the internal carotid artery. At the completion was a good pulse distal to the patch and a good Doppler signal. The heparin was partially reversed with protamine. The deep layer was closed with running 3-0 Vicryl. The platysma was) 3-0 Vicryl. The skin was closed with a 4-0 subcuticular stitch. Dermabond was applied. She tolerated the procedure well and transferred to the recovery room in stable condition. All needle and sponge counts were correct.  Waverly Ferrari, MD, FACS [facility]  DATE OF DICTATION:   06/07/2012

## 2012-06-07 NOTE — Anesthesia Preprocedure Evaluation (Signed)
Anesthesia Evaluation  Patient identified by MRN, date of birth, ID band Patient awake    Reviewed: Allergy & Precautions, H&P , NPO status , Patient's Chart, lab work & pertinent test results, reviewed documented beta blocker date and time   Airway Mallampati: II TM Distance: >3 FB Neck ROM: full    Dental   Pulmonary neg pulmonary ROS,  breath sounds clear to auscultation        Cardiovascular hypertension, On Medications and On Home Beta Blockers + CAD, + Past MI and + Peripheral Vascular Disease Rhythm:regular     Neuro/Psych negative neurological ROS  negative psych ROS   GI/Hepatic negative GI ROS, Neg liver ROS,   Endo/Other  diabetes, Oral Hypoglycemic Agents  Renal/GU negative Renal ROS  negative genitourinary   Musculoskeletal   Abdominal   Peds  Hematology negative hematology ROS (+)   Anesthesia Other Findings See surgeon's H&P   Reproductive/Obstetrics negative OB ROS                           Anesthesia Physical Anesthesia Plan  ASA: III  Anesthesia Plan: General   Post-op Pain Management:    Induction: Intravenous  Airway Management Planned: Oral ETT  Additional Equipment: Arterial line  Intra-op Plan:   Post-operative Plan: Extubation in OR  Informed Consent: I have reviewed the patients History and Physical, chart, labs and discussed the procedure including the risks, benefits and alternatives for the proposed anesthesia with the patient or authorized representative who has indicated his/her understanding and acceptance.   Dental Advisory Given  Plan Discussed with: CRNA and Surgeon  Anesthesia Plan Comments:         Anesthesia Quick Evaluation

## 2012-06-07 NOTE — Progress Notes (Signed)
VASCULAR PROGRESS NOTE  SUBJECTIVE: Comfortable   PHYSICAL EXAM: Filed Vitals:   06/07/12 1145 06/07/12 1146 06/07/12 1200 06/07/12 1300  BP:   114/67 107/62  Pulse: 61 58 62 59  Temp:   98.4 F (36.9 C)   TempSrc:   Oral   Resp: 15  17 14   Height:   5\' 10"  (1.778 m)   Weight:   221 lb 9 oz (100.5 kg)   SpO2: 96% 96% 98% 98%   Neuro intact Incision looks fine  CBG (last 3)   Basename 06/07/12 1203 06/07/12 1102 06/07/12 0621  GLUCAP 199* 177* 176*   ASSESSMENT/PLAN: 1. Doing well post op. Anticipate D/C in AM.  Cari Caraway Beeper: [phone] 06/07/2012

## 2012-06-07 NOTE — OR Nursing (Signed)
06/07/12- preop neuro check-handgrips strong and equal, moves all 4 extremities,tongue midline.  postop same as preop.   Astrid Drafts RNFA

## 2012-06-07 NOTE — Interval H&P Note (Signed)
History and Physical Interval Note:  06/07/2012 7:00 AM  Daniel Gibson  has presented today for surgery, with the diagnosis of right ica stenosis  The various methods of treatment have been discussed with the patient and family. After consideration of risks, benefits and other options for treatment, the patient has consented to  Procedure(s) (LRB) with comments: ENDARTERECTOMY CAROTID (Right) as a surgical intervention .  The patient's history has been reviewed, patient examined, no change in status, stable for surgery.  I have reviewed the patient's chart and labs.  Questions were answered to the patient's satisfaction.     [name]

## 2012-06-07 NOTE — Telephone Encounter (Addendum)
Message copied by Rosalyn Charters on Fri Jun 07, 2012  1:22 PM ------      Message from: Melene Plan      Created: Fri Jun 07, 2012 11:33 AM      Regarding: FW: charge and f/u                   ----- Message -----         From: Chuck Hint, MD         Sent: 06/07/2012  10:33 AM           To: Reuel Derby, Melene Plan, RN      Subject: charge and f/u                                           This patient had a right carotid endarterectomy with Dacron patch angioplasty. The stenosis was 90% and very high. He will need a follow up visit in 2 weeks. Thank you. CSD  notified patient of fu appt. with csd on 07-03-12 2:45 pm  and mailed appt. letter

## 2012-06-07 NOTE — H&P (View-Only) (Signed)
[facility] [place]  Patient name: Daniel Gibson MRN: [id number] DOB: Apr 18, 1951 Sex: male  CC: asymptomatic greater than 80% right carotid stenosis. Referred by Dr. Burnell Blanks  HPI: Daniel Gibson is a 61 y.o. male who was found to have a right carotid stenosis on a routine carotid duplex scan approximately 1 year ago.he had a follow up study by [facility] imaging on 03/28/2012. This showed progression of the right carotid stenosis which was now in the 70-99% range with a less than 50% left carotid stenosis. He was sent for vascular consultation. He is right-handed. He denies any history of stroke, TIAs, expressive or receptive aphasia, or amaurosis fugax. He is on aspirin and also a statin. He has a strong family history of cerebral vascular disease.  Past Medical History  Diagnosis Date  . Diabetes mellitus without complication   . Carotid artery occlusion   . Cancer     prostate  . CAD (coronary artery disease)   . Hyperlipidemia   . Hypertension   . Thrombocytopenia   . Myocardial infarction     Family History  Problem Relation Age of Onset  . Cancer Mother   . Heart attack Father   . Diabetes Sister   . Heart disease Sister     before age 23  . Hypertension Sister   . Diabetes Brother   . Heart disease Brother     before age 44  . Hyperlipidemia Brother   . Hypertension Brother     SOCIAL HISTORY: History  Substance Use Topics  . Smoking status: Never Smoker   . Smokeless tobacco: Never Used  . Alcohol Use: No    Allergies  Allergen Reactions  . Tetanus Toxoids     Current Outpatient Prescriptions  Medication Sig Dispense Refill  . amLODipine (NORVASC) 10 MG tablet Take 10 mg by mouth daily.      [name] aspirin 325 MG tablet Take 325 mg by mouth daily.      . carvedilol (COREG) 25 MG tablet Take 12.5 mg by mouth 2 (two) times daily with a meal.      . fluocinonide (LIDEX) 0.05 % external solution Apply topically 2 (two) times daily.  When itching      . metFORMIN (GLUCOPHAGE) 1000 MG tablet Take 1,000 mg by mouth 2 (two) times daily with a meal.      . Niacin-Simvastatin 1000-40 MG TB24 Take by mouth.      . nitroGLYCERIN (NITROSTAT) 0.4 MG SL tablet Place 0.4 mg under the tongue every 5 (five) minutes as needed.      [name] omega-3 acid ethyl esters (LOVAZA) 1 G capsule Take 2 g by mouth 2 (two) times daily.      [name] oxybutynin (DITROPAN-XL) 10 MG 24 hr tablet Take 10 mg by mouth daily.      . ramipril (ALTACE) 10 MG capsule Take 10 mg by mouth daily.      [name] zolpidem (AMBIEN) 10 MG tablet Take 10 mg by mouth at bedtime as needed.        REVIEW OF SYSTEMS: Arly.Keller ] denotes positive finding; [  ] denotes negative finding CARDIOVASCULAR:  [ ]  chest pain   [ ]  chest pressure   [ ]  palpitations   [ ]  orthopnea   [ ]  dyspnea on exertion   [ ]  claudication   [ ]  rest pain   [ ]  DVT   [ ]  phlebitis PULMONARY:   [ ]  productive cough   [ ]   asthma   [ ]  wheezing NEUROLOGIC:   [ ]  weakness  [ ]  paresthesias  [ ]  aphasia  [ ]  amaurosis  [ ]  dizziness HEMATOLOGIC:   [ ]  bleeding problems   [ ]  clotting disorders MUSCULOSKELETAL:  [ ]  joint pain   [ ]  joint swelling [ ]  leg swelling GASTROINTESTINAL: [ ]   blood in stool  [ ]   hematemesis GENITOURINARY:  [ ]   dysuria  [ ]   hematuria PSYCHIATRIC:  [ ]  history of major depression INTEGUMENTARY:  [ ]  rashes  [ ]  ulcers CONSTITUTIONAL:  [ ]  fever   [ ]  chills  PHYSICAL EXAM: Filed Vitals:   05/08/12 0926  BP: 167/86  Pulse: 66  Height: 5\' 10"  (1.778 m)  Weight: 224 lb (101.606 kg)  SpO2: 100%   Body mass index is 32.14 kg/(m^2). GENERAL: The patient is a well-nourished male, in no acute distress. The vital signs are documented above. CARDIOVASCULAR: There is a regular rate and rhythm without significant murmur appreciated. He has a right carotid bruit. He has palpable femoral, popliteal, and pedal pulses bilaterally. He has no significant lower extremity swelling. PULMONARY: There is good  air exchange bilaterally without wheezing or rales. ABDOMEN: Soft and non-tender with normal pitched bowel sounds. I do not appreciate an abdominal aortic aneurysm although his abdomen is obese and difficult to assess. MUSCULOSKELETAL: There are no major deformities or cyanosis. NEUROLOGIC: No focal weakness or paresthesias are detected. SKIN: There are no ulcers or rashes noted. PSYCHIATRIC: The patient has a normal affect.  DATA:  I have reviewed his carotid duplex scan from [facility] imaging which shows a 70-99% right carotid stenosis with a less than 50% left carotid stenosis. There is normal he directed flow in both vertebral arteries.  I [facility]. He has type 2 diabetes without any significant complicating features. He has benign essential hypertension and hyperlipidemia. In addition he has a history of thrombocytopenia.  I have independently interpreted her carotid duplex scan of the right carotid artery in our office which shows a greater than 80% right carotid stenosis. The ICA be on the stenosis is tortuous but no disease is noted.  MEDICAL ISSUES:  Occlusion and stenosis of carotid artery without mention of cerebral infarction This patient has a greater than 80% asymptomatic right carotid stenosis. I have recommended right carotid endarterectomy in order to lower his risk of future stroke. I have reviewed the indications for carotid endarterectomy.  I have also reviewed the potential complications of surgery, including but not limited to: bleeding, stroke (perioperative risk 1-2%), MI, nerve injury of other unpredictable medical problems. All of the patients questions were answered and they are agreeable to proceed with surgery. The patient is on a statin and is also on aspirin. The surgery is scheduled for 06/07/2012.    Daniel Gibson S [facility] [place] Office: [phone]

## 2012-06-07 NOTE — Anesthesia Postprocedure Evaluation (Signed)
Anesthesia Post Note  Patient: Daniel Gibson  Procedure(s) Performed: Procedure(s) (LRB): ENDARTERECTOMY CAROTID (Right)  Anesthesia type: general  Patient location: PACU  Post pain: Pain level controlled  Post assessment: Patient's Cardiovascular Status Stable  Last Vitals:  Filed Vitals:   06/07/12 1200  BP: 114/67  Pulse: 62  Temp: 36.9 C  Resp: 17    Post vital signs: Reviewed and stable  Level of consciousness: sedated  Complications: No apparent anesthesia complications

## 2012-06-07 NOTE — Preoperative (Signed)
Beta Blockers   Reason not to administer Beta Blockers:Not Applicable 

## 2012-06-07 NOTE — Discharge Summary (Signed)
[facility] Vein Specialists Discharge Summary   Patient ID:  Daniel Gibson MRN: [id number] DOB/AGE: 60-Apr-1952 8 y.o.  Admit date: 06/07/2012 Discharge date: 06/08/2012 Date of Surgery: 06/07/2012 Surgeon: Surgeon(s): Chuck Hint, MD  Admission Diagnosis: right ica stenosis  Discharge Diagnoses:  right ica stenosis  Secondary Diagnoses: Past Medical History  Diagnosis Date  . Diabetes mellitus without complication   . Carotid artery occlusion   . Cancer     prostate  . CAD (coronary artery disease)   . Hyperlipidemia   . Hypertension   . Thrombocytopenia   . Myocardial infarction 1993    medically managed    Procedure(s): ENDARTERECTOMY CAROTID  Discharged Condition: good  HPI:  Daniel Gibson is a 61 y.o. male who was found to have a right carotid stenosis on a routine carotid duplex scan approximately 1 year ago.he had a follow up study by [facility] imaging on 03/28/2012. This showed progression of the right carotid stenosis which was now in the 70-99% range with a less than 50% left carotid stenosis. He was sent for vascular consultation. He is right-handed. He denies any history of stroke, TIAs, expressive or receptive aphasia, or amaurosis fugax. He is on aspirin and also a statin. He has a strong family history of cerebral vascular disease.   Hospital Course:  Daniel Gibson is a 61 y.o. male is S/P Right Procedure(s): ENDARTERECTOMY CAROTID Extubated: POD # 0 Post-op wounds healing well Pt. Ambulating, voiding and taking PO diet without difficulty. Neuro exam: No facial droop, smile is symmetrical. Pt pain controlled with PO pain meds. Labs as below Complications:none  Consults:     Significant Diagnostic Studies: CBC Lab Results  Component Value Date   WBC 8.3 05/31/2012   HGB 15.4 05/31/2012   HCT 41.6 05/31/2012   MCV 85.8 05/31/2012   PLT 129* 05/31/2012    BMET    Component Value Date/Time   NA 139 05/31/2012 1343   K 3.8  05/31/2012 1343   CL 106 05/31/2012 1343   CO2 24 05/31/2012 1343   GLUCOSE 113* 05/31/2012 1343   BUN 11 05/31/2012 1343   CREATININE 0.88 05/31/2012 1343   CALCIUM 9.5 05/31/2012 1343   GFRNONAA >90 05/31/2012 1343   GFRAA >90 05/31/2012 1343   COAG Lab Results  Component Value Date   INR 1.01 05/31/2012   INR 0.93 10/03/2010     Disposition:  Discharge to :Home Discharge Orders    Future Orders Please Complete By Expires   Resume previous diet      Driving Restrictions      Comments:   No driving for 2 weeks   Lifting restrictions      Comments:   No lifting for 4 weeks   Call MD for:  temperature >100.5      Call MD for:  redness, tenderness, or signs of infection (pain, swelling, bleeding, redness, odor or green/yellow discharge around incision site)      Call MD for:  severe or increased pain, loss or decreased feeling  in affected limb(s)      Increase activity slowly      Comments:   Walk with assistance use walker or cane as needed   May shower       Scheduling Instructions:   Sunday   No dressing needed      may wash over wound with mild soap and water      CAROTID Sugery: Call MD for difficulty swallowing or speaking; weakness in arms or  legs that is a new symtom; severe headache.  If you have increased swelling in the neck and/or  are having difficulty breathing, CALL 911         Daniel Gibson, Daniel Gibson  Home Medication Instructions [id number]   Printed on:06/07/12 1020  Medication Information                    ramipril (ALTACE) 10 MG capsule Take 10 mg by mouth daily.           amLODipine (NORVASC) 10 MG tablet Take 10 mg by mouth daily.           metFORMIN (GLUCOPHAGE) 1000 MG tablet Take 1,000 mg by mouth 2 (two) times daily with a meal.           carvedilol (COREG) 25 MG tablet Take 12.5 mg by mouth 2 (two) times daily with a meal.           aspirin 325 MG tablet Take 325 mg by mouth daily.           niacin 500 MG tablet Take 2,000 mg by mouth daily  with breakfast. Patient uses 2000 mg at bedtime.           atorvastatin (LIPITOR) 40 MG tablet Take 40 mg by mouth daily. Patient uses this medication at bedtime.           oxyCODONE-acetaminophen (ROXICET) 5-325 MG per tablet Take 1 tablet by mouth every 4 (four) hours as needed for pain.            Verbal and written Discharge instructions given to the patient. Wound care per Discharge AVS Follow-up Information    Follow up with DICKSON,CHRISTOPHER S, MD. In 2 weeks. (office will arrange-sent)    Contact information:   [address] [place] [address] [phone]          Signed: Marlowe Shores 06/07/2012, 10:20 AM

## 2012-06-07 NOTE — Progress Notes (Signed)
Received patient from PACU.  Patient placed on monitor.  Neuro intact and VVS.  Patient and family oriented to unit and routines.  Will continue to monitor.

## 2012-06-08 LAB — BASIC METABOLIC PANEL
BUN: 14 mg/dL (ref 6–23)
Chloride: 106 mEq/L (ref 96–112)
GFR calc non Af Amer: 75 mL/min — ABNORMAL LOW (ref >90)
Glucose, Bld: 236 mg/dL — ABNORMAL HIGH (ref 70–99)
Potassium: 4.1 mEq/L (ref 3.5–5.1)

## 2012-06-08 LAB — CBC
HCT: 34.7 % — ABNORMAL LOW (ref 39.0–52.0)
Hemoglobin: 12.8 g/dL — ABNORMAL LOW (ref 13.0–17.0)
MCHC: 36.9 g/dL — ABNORMAL HIGH (ref 30.0–36.0)
MCV: 84.8 fL (ref 78.0–100.0)

## 2012-06-08 LAB — GLUCOSE, CAPILLARY: Glucose-Capillary: 207 mg/dL — ABNORMAL HIGH (ref 70–99)

## 2012-06-08 NOTE — Progress Notes (Signed)
Discharge instructions given to patient, with teach back.  No complaints, to be discharged to home.

## 2012-06-08 NOTE — Progress Notes (Addendum)
VASCULAR AND VEIN SURGERY POST - OP [facility] PROGRESS NOTE  Date of Surgery: 06/07/2012  Surgeon(s): Chuck Hint, MD 1 Day Post-Op right Carotid Endarterectomy .  HPI: Daniel Gibson is a 61 y.o. male who is 1 Day Post-Op right Carotid Endarterectomy . Patient is doing well. Pre-operative symptoms are Improved Patient denies headache; Patient denies difficulty swallowing; denies weakness in upper or lower extremities; Pt. denies other symptoms of stroke or TIA.  IMAGING: No results found.  Significant Diagnostic Studies: CBC Lab Results  Component Value Date   WBC 14.1* 06/08/2012   HGB 12.8* 06/08/2012   HCT 34.7* 06/08/2012   MCV 84.8 06/08/2012   PLT 112* 06/08/2012    BMET    Component Value Date/Time   NA 137 06/08/2012 0339   K 4.1 06/08/2012 0339   CL 106 06/08/2012 0339   CO2 22 06/08/2012 0339   GLUCOSE 236* 06/08/2012 0339   BUN 14 06/08/2012 0339   CREATININE 1.05 06/08/2012 0339   CALCIUM 8.5 06/08/2012 0339   GFRNONAA 75* 06/08/2012 0339   GFRAA 87* 06/08/2012 0339    COAG Lab Results  Component Value Date   INR 1.01 05/31/2012   INR 0.93 10/03/2010   No results found for this basename: PTT      Intake/Output Summary (Last 24 hours) at 06/08/12 0809 Last data filed at 06/08/12 1610  Gross per 24 hour  Intake   2700 ml  Output   1825 ml  Net    875 ml    Physical Exam:  BP Readings from Last 3 Encounters:  06/08/12 129/57  06/08/12 129/57  05/31/12 141/77   Temp Readings from Last 3 Encounters:  06/08/12 98.1 F (36.7 C) Oral  06/08/12 98.1 F (36.7 C) Oral  05/31/12 97.9 F (36.6 C) Oral   SpO2 Readings from Last 3 Encounters:  06/08/12 90%  06/08/12 90%  05/31/12 95%   Pulse Readings from Last 3 Encounters:  06/08/12 63  06/08/12 63  05/31/12 65    Pt is A&O x 3 Gait is normal Speech is fluent right Neck Wound is healing well Patient with Negative tongue deviation and Negative facial droop Pt has good and  equal strength in all extremities  Assessment/Plan:: Daniel Gibson is a 61 y.o. male is S/P Right Carotid endarterectomy Pt is voiding, ambulating and taking po well    Discharge to: Home Follow-up in 2 weeks   Clinton Gallant [facility], [facility], [facility] [phone] 06/08/2012 8:09 AM   Neuro intact No neck hematoma D/c home  Fabienne Bruns, MD [facility] [place] Office: [phone] Pager: [phone]

## 2012-06-10 ENCOUNTER — Encounter (HOSPITAL_COMMUNITY): Payer: Self-pay | Admitting: Vascular Surgery

## 2012-06-10 NOTE — Discharge Summary (Signed)
Agree with plans for D/C  Cari Caraway Beeper [phone] 06/10/2012

## 2012-07-02 ENCOUNTER — Encounter: Payer: Self-pay | Admitting: Vascular Surgery

## 2012-07-03 ENCOUNTER — Ambulatory Visit (INDEPENDENT_AMBULATORY_CARE_PROVIDER_SITE_OTHER): Payer: Self-pay | Admitting: Vascular Surgery

## 2012-07-03 ENCOUNTER — Encounter: Payer: Self-pay | Admitting: Vascular Surgery

## 2012-07-03 VITALS — BP 110/63 | HR 66 | Temp 98.4°F | Ht 70.0 in | Wt 212.9 lb

## 2012-07-03 DIAGNOSIS — Z48812 Encounter for surgical aftercare following surgery on the circulatory system: Secondary | ICD-10-CM

## 2012-07-03 DIAGNOSIS — I6529 Occlusion and stenosis of unspecified carotid artery: Secondary | ICD-10-CM

## 2012-07-03 NOTE — Progress Notes (Signed)
[facility] of [place]  Patient name: Daniel Gibson MRN: [id number] DOB: 1950/08/06 Sex: male  REASON FOR VISIT: follow up after right carotid endarterectomy  HPI: Daniel Gibson is a 62 y.o. male I have been following with carotid disease. The right carotid stenosis progressed to greater than 80% and right carotid endarterectomy was recommended. He underwent right carotid endarterectomy with Dacron patch angioplasty on 06/07/2012. Of note the stenosis extended very high at the internal carotid artery. He returns for his first outpatient visit. He denies any focal weakness or paresthesias. He's had no significant pain from the incision. His had no problems swallowing. He is on aspirin.  REVIEW OF SYSTEMS: Arly.Keller ] denotes positive finding; [  ] denotes negative finding  CARDIOVASCULAR:  [ ]  chest pain   [ ]  dyspnea on exertion    CONSTITUTIONAL:  [ ]  fever   [ ]  chills  PHYSICAL EXAM: Filed Vitals:   07/03/12 1451 07/03/12 1453  BP: 113/64 110/63  Pulse: 66   Temp: 98.4 F (36.9 C)   TempSrc: Oral   Height: 5\' 10"  (1.778 m)   Weight: 212 lb 14.4 oz (96.571 kg)   SpO2: 99%    Body mass index is 30.55 kg/(m^2). GENERAL: The patient is a well-nourished male, in no acute distress. The vital signs are documented above. CARDIOVASCULAR: There is a regular rate and rhythm  PULMONARY: There is good air exchange bilaterally without wheezing or rales. His neck incision is healing nicely. He has no focal weakness or paresthesias.  MEDICAL ISSUES:  Occlusion and stenosis of carotid artery without mention of cerebral infarction The patient is now doing well status post right carotid endarterectomy. He is asymptomatic. He has a moderate 50% left carotid stenosis which we will continue to follow. Is not a smoker. Discussed with him the importance of diet and exercise. Ordered a follow up duplex scan in 6 months and we will see him back at that time. If the scan looks stable at that  time we can probably stretch his follow up  out to one year.    Kainoah Bartosiewicz S [facility] [place] Beeper: [phone]

## 2012-07-03 NOTE — Assessment & Plan Note (Signed)
The patient is now doing well status post right carotid endarterectomy. He is asymptomatic. He has a moderate 50% left carotid stenosis which we will continue to follow. Is not a smoker. Discussed with him the importance of diet and exercise. Ordered a follow up duplex scan in 6 months and we will see him back at that time. If the scan looks stable at that time we can probably stretch his follow up  out to one year.

## 2012-07-04 NOTE — Addendum Note (Signed)
Addended by: Sharee Pimple on: 07/04/2012 09:30 AM   Modules accepted: Orders

## 2013-01-01 ENCOUNTER — Other Ambulatory Visit: Payer: Self-pay

## 2013-01-01 ENCOUNTER — Ambulatory Visit: Payer: Self-pay | Admitting: Neurosurgery

## 2013-01-28 ENCOUNTER — Encounter: Payer: Self-pay | Admitting: Vascular Surgery

## 2013-01-29 ENCOUNTER — Encounter: Payer: Self-pay | Admitting: Vascular Surgery

## 2013-01-29 ENCOUNTER — Ambulatory Visit (INDEPENDENT_AMBULATORY_CARE_PROVIDER_SITE_OTHER): Payer: BC Managed Care – PPO | Admitting: Vascular Surgery

## 2013-01-29 ENCOUNTER — Other Ambulatory Visit (INDEPENDENT_AMBULATORY_CARE_PROVIDER_SITE_OTHER): Payer: BC Managed Care – PPO | Admitting: *Deleted

## 2013-01-29 VITALS — BP 117/68 | HR 63 | Resp 16 | Ht 70.0 in | Wt 218.0 lb

## 2013-01-29 DIAGNOSIS — Z48812 Encounter for surgical aftercare following surgery on the circulatory system: Secondary | ICD-10-CM

## 2013-01-29 DIAGNOSIS — I6529 Occlusion and stenosis of unspecified carotid artery: Secondary | ICD-10-CM

## 2013-01-29 NOTE — Progress Notes (Signed)
[facility] of [place]  Patient name: Daniel Gibson MRN: [id number] DOB: May 21, 1951 Sex: male  REASON FOR VISIT: follow up after right carotid endarterectomy.   HPI: Daniel Gibson is a 62 y.o. male who underwent a right carotid endarterectomy on 06/07/2012. Comes in for a 6 month follow up visit. He has no specific complaints overalls been doing well. He denies any history of stroke, TIAs, expressive or receptive aphasia, or amaurosis fugax. He is on aspirin and also on the statin. His blood sugars have been under good control. Hypertension has been under good control. He is not a smoker.  Past Medical History  Diagnosis Date  . Diabetes mellitus without complication   . Carotid artery occlusion   . Cancer     prostate  . CAD (coronary artery disease)   . Hyperlipidemia   . Hypertension   . Thrombocytopenia   . Myocardial infarction 1993    medically managed     SOCIAL HISTORY: History  Substance Use Topics  . Smoking status: Never Smoker   . Smokeless tobacco: Never Used  . Alcohol Use: No   Allergies  Allergen Reactions  . Tetanus Toxoids Swelling   Current Outpatient Prescriptions  Medication Sig Dispense Refill  . amLODipine (NORVASC) 10 MG tablet Take 10 mg by mouth daily.      [name] aspirin 325 MG tablet Take 325 mg by mouth daily.      [name] atorvastatin (LIPITOR) 40 MG tablet Take 40 mg by mouth daily. Patient uses this medication at bedtime.      . carvedilol (COREG) 25 MG tablet Take 12.5 mg by mouth 2 (two) times daily with a meal.      . metFORMIN (GLUCOPHAGE) 1000 MG tablet Take 1,000 mg by mouth 2 (two) times daily with a meal.      . niacin 500 MG tablet Take 2,000 mg by mouth daily with breakfast. Patient uses 2000 mg at bedtime.      . nitroGLYCERIN (NITROSTAT) 0.4 MG SL tablet Place 0.4 mg under the tongue every 5 (five) minutes as needed.      . ramipril (ALTACE) 10 MG capsule Take 10 mg by mouth daily.      . Tamsulosin HCl (FLOMAX) 0.4 MG  CAPS Take 0.4 mg by mouth daily.      [name] oxyCODONE-acetaminophen (ROXICET) 5-325 MG per tablet Take 1 tablet by mouth every 4 (four) hours as needed for pain.  30 tablet  0   No current facility-administered medications for this visit.   REVIEW OF SYSTEMS: Arly.Keller ] denotes positive finding; [  ] denotes negative finding  CARDIOVASCULAR:  [ ]  chest pain   [ ]  chest pressure   [ ]  palpitations   [ ]  orthopnea   [ ]  dyspnea on exertion   [ ]  claudication   [ ]  rest pain   [ ]  DVT   [ ]  phlebitis PULMONARY:   [ ]  productive cough   [ ]  asthma   [ ]  wheezing NEUROLOGIC:   [ ]  weakness  [ ]  paresthesias  [ ]  aphasia  [ ]  amaurosis  [ ]  dizziness HEMATOLOGIC:   [ ]  bleeding problems   [ ]  clotting disorders MUSCULOSKELETAL:  [ ]  joint pain   [ ]  joint swelling [ ]  leg swelling GASTROINTESTINAL: [ ]   blood in stool  [ ]   hematemesis GENITOURINARY:  [ ]   dysuria  [ ]   hematuria PSYCHIATRIC:  [ ]  history of major depression INTEGUMENTARY:  [ ]   rashes  [ ]  ulcers CONSTITUTIONAL:  [ ]  fever   [ ]  chills  PHYSICAL EXAM: Filed Vitals:   01/29/13 1023 01/29/13 1026  BP: 118/77 117/68  Pulse: 62 63  Resp: 16   Height: 5\' 10"  (1.778 m)   Weight: 218 lb (98.884 kg)   SpO2: 95%    Body mass index is 31.28 kg/(m^2). GENERAL: The patient is a well-nourished male, in no acute distress. The vital signs are documented above. CARDIOVASCULAR: There is a regular rate and rhythm. He has no carotid bruits. PULMONARY: There is good air exchange bilaterally without wheezing or rales. ABDOMEN: Soft and non-tender with normal pitched bowel sounds.  MUSCULOSKELETAL: There are no major deformities or cyanosis. NEUROLOGIC: No focal weakness or paresthesias are detected. SKIN: There are no ulcers or rashes noted. PSYCHIATRIC: The patient has a normal affect.  DATA:  I have independently interpreted his carotid duplex scan which shows some mildly elevated velocities at the distal patch on the right. This produces a  40-59% stenosis in the lower end of that range. Is no significant left carotid stenosis. Both vertebral arteries are patent with antegrade flow.  MEDICAL ISSUES: Patient is doing well status post right carotid endarterectomy. He'll be due for a carotid duplex in 6 months and we will see him back at that time. He knows to call sooner if he has problems. In the meantime he is to continue taking his aspirin. There is mild stenosis at the distal internal carotid artery patch on the right is likely intimal hyperplasia. We will need to continue to follow this closely.  DICKSON,CHRISTOPHER S [facility] [place] Beeper: [phone]

## 2013-01-30 NOTE — Addendum Note (Signed)
Addended by: Sharee Pimple on: 01/30/2013 08:25 AM   Modules accepted: Orders

## 2013-05-01 ENCOUNTER — Other Ambulatory Visit: Payer: Self-pay

## 2013-08-05 ENCOUNTER — Encounter: Payer: Self-pay | Admitting: Family

## 2013-08-06 ENCOUNTER — Ambulatory Visit (INDEPENDENT_AMBULATORY_CARE_PROVIDER_SITE_OTHER): Payer: BC Managed Care – PPO | Admitting: Family

## 2013-08-06 ENCOUNTER — Ambulatory Visit (HOSPITAL_COMMUNITY)
Admission: RE | Admit: 2013-08-06 | Discharge: 2013-08-06 | Disposition: A | Discharge status: Home / Self Care | Payer: BC Managed Care – PPO | Source: Ambulatory Visit | Attending: Vascular Surgery | Admitting: Vascular Surgery

## 2013-08-06 ENCOUNTER — Encounter: Payer: Self-pay | Admitting: Family

## 2013-08-06 VITALS — BP 160/81 | HR 81 | Resp 16 | Ht 70.0 in | Wt 225.0 lb

## 2013-08-06 DIAGNOSIS — I6529 Occlusion and stenosis of unspecified carotid artery: Secondary | ICD-10-CM

## 2013-08-06 DIAGNOSIS — Z48812 Encounter for surgical aftercare following surgery on the circulatory system: Secondary | ICD-10-CM

## 2013-08-06 DIAGNOSIS — T82898A Other specified complication of vascular prosthetic devices, implants and grafts, initial encounter: Secondary | ICD-10-CM | POA: Insufficient documentation

## 2013-08-06 DIAGNOSIS — Y838 Other surgical procedures as the cause of abnormal reaction of the patient, or of later complication, without mention of misadventure at the time of the procedure: Secondary | ICD-10-CM | POA: Insufficient documentation

## 2013-08-06 NOTE — Patient Instructions (Signed)

## 2013-08-06 NOTE — Progress Notes (Signed)
Established Carotid Patient   History of Present Illness  Daniel Gibson is a 63 y.o. male patient of Dr. Scot Dock who underwent a right carotid endarterectomy on 06/07/2012. He returns today for follow up.  Patient has Negative history of TIA or stroke symptom.  The patient denies amaurosis fugax or monocular blindness.  The patient  denies facial drooping.  Pt. denies hemiplegia.  The patient denies receptive or expressive aphasia.  Pt. denies extremity weakness. He states his blood pressure is usually about 120/75. He had an MI in 1993, no CABG or stent, medical management, nuclear stress test last August, states good result  Pt denies New Medical or Surgical History. He states he exercises regularly.   Pt Diabetic: Yes, states his last A1C was 6.? Pt smoker: non-smoker  Pt meds include: Statin : Yes, states his cholesterol is in good control ASA: Yes Other anticoagulants/antiplatelets: no   Past Medical History  Diagnosis Date  . Diabetes mellitus without complication   . Carotid artery occlusion   . Cancer     prostate  . CAD (coronary artery disease)   . Hyperlipidemia   . Hypertension   . Thrombocytopenia   . Myocardial infarction 1993    medically managed    Social History History  Substance Use Topics  . Smoking status: Never Smoker   . Smokeless tobacco: Never Used  . Alcohol Use: No    Family History Family History  Problem Relation Age of Onset  . Cancer Mother   . Hyperlipidemia Mother   . Hypertension Mother   . Heart attack Father   . Heart disease Father     Heart Disease before age 81  . Hyperlipidemia Father   . Hypertension Father   . Diabetes Sister   . Heart disease Sister     before age 50  . Hypertension Sister   . Hyperlipidemia Sister   . Diabetes Brother   . Heart disease Brother     before age 53  . Hyperlipidemia Brother   . Hypertension Brother     Surgical History Past Surgical History  Procedure Laterality Date  .  Appendectomy    . Transperineal implant of radiation seeds w/ ultrasound    . Endarterectomy  06/07/2012    Procedure: ENDARTERECTOMY CAROTID;  Surgeon: Angelia Mould, MD;  Location: [facility] OR;  Service: Vascular;  Laterality: Right;  Right Carotid Endarterectomy with Patch Angioplasty  . Carotid endarterectomy Right 06-07-12    cea    Allergies  Allergen Reactions  . Tetanus Toxoids Swelling    Current Outpatient Prescriptions  Medication Sig Dispense Refill  . amLODipine (NORVASC) 10 MG tablet Take 10 mg by mouth daily.      [name] aspirin 325 MG tablet Take 325 mg by mouth daily.      [name] atorvastatin (LIPITOR) 40 MG tablet Take 40 mg by mouth daily. Patient uses this medication at bedtime.      . carvedilol (COREG) 25 MG tablet Take 12.5 mg by mouth 2 (two) times daily with a meal.      . fesoterodine (TOVIAZ) 4 MG TB24 tablet Take 4 mg by mouth daily.      . metFORMIN (GLUCOPHAGE) 1000 MG tablet Take 1,000 mg by mouth 2 (two) times daily with a meal.      . niacin 500 MG tablet Take 2,000 mg by mouth daily with breakfast. Patient uses 2000 mg at bedtime.      . nitroGLYCERIN (NITROSTAT) 0.4 MG SL tablet Place  0.4 mg under the tongue every 5 (five) minutes as needed.      . ramipril (ALTACE) 10 MG capsule Take 10 mg by mouth daily.      . Tamsulosin HCl (FLOMAX) 0.4 MG CAPS Take 0.4 mg by mouth daily.      [name] oxyCODONE-acetaminophen (ROXICET) 5-325 MG per tablet Take 1 tablet by mouth every 4 (four) hours as needed for pain.  30 tablet  0   No current facility-administered medications for this visit.    Review of Systems : See HPI for pertinent positives and negatives.  Physical Examination  Filed Vitals:   08/06/13 1050  BP: 160/81  Pulse: 81  Resp: 16   Filed Weights   08/06/13 1050  Weight: 225 lb (102.059 kg)   Body mass index is 32.28 kg/(m^2).  General: WDWN obese male in NAD GAIT: normal Eyes: PERRLA Pulmonary:  Non-labored, CTAB, Negative  Rales, Negative  rhonchi, & Negative wheezing.  Cardiac: regular Rhythm ,  Negative detected murmur.  VASCULAR EXAM Carotid Bruits Left Right   Negative Negative     Radial pulses are 2+ palpable and equal.                                                                                                                            LE Pulses LEFT RIGHT       POPLITEAL  not palpable   not palpable       POSTERIOR TIBIAL   palpable    palpable        DORSALIS PEDIS      ANTERIOR TIBIAL  not palpable  not palpable     Gastrointestinal: soft, nontender, BS WNL, no r/g,  negative masses.  Musculoskeletal: Negative muscle atrophy/wasting. M/S 5/5 throughout, Extremities without ischemic changes.  Neurologic: A&O X 3; Appropriate Affect ; SENSATION ;normal;  Speech is normal CN 2-12 intact , Pain and light touch intact in extremities, Motor exam as listed above.   Non-Invasive Vascular Imaging CAROTID DUPLEX 08/06/2013   CEREBROVASCULAR DUPLEX EVALUATION    INDICATION: Follow-up carotid disease     PREVIOUS INTERVENTION(S): Right carotid endarterectomy 06/07/2012    DUPLEX EXAM:     RIGHT  LEFT  Peak Systolic Velocities (cm/s) End Diastolic Velocities (cm/s) Plaque LOCATION Peak Systolic Velocities (cm/s) End Diastolic Velocities (cm/s) Plaque  136 24  CCA PROXIMAL 116 18   80 13  CCA MID 101 23   67 18  CCA DISTAL 76 17   186 22  ECA 170 26   69 24 HM ICA PROXIMAL 95 37 HT  170 69 HM ICA MID 119 39   109 39  ICA DISTAL 67 31     NA ICA / CCA Ratio (PSV) 1.25  Antegrade  Vertebral Flow Antegrade   010 Brachial Systolic Pressure (mmHg) 272  Within normal limits  Brachial Artery Waveforms Within normal limits     Plaque Morphology:  HM = Homogeneous, HT =  Heterogeneous, CP = Calcific Plaque, SP = Smooth Plaque, IP = Irregular Plaque     ADDITIONAL FINDINGS: Proximal left subclavian artery velocity is >400cm/second. Mid subclavian artery arterial waveform is within normal limits.     IMPRESSION: 1. Patent right carotid endarterectomy with moderate hyperplasia/ restenosis at the distal patch resulting in 60%-79% stenosis. 2. Evidence of <40% stenosis of the left internal carotid artery. 3. Bilateral vertebral artery is antegrade.    Compared to the previous exam:  Disease of right carotid endarterectomy has increased compared to previous exam.     Assessment: Daniel Gibson is a 63 y.o. male who presents with asymptomatic patent right CEA site with  moderate hyperplasia/ restenosis at the distal patch resulting in 60%-79% stenosis. Evidence of <40% stenosis of the left internal carotid artery.  Disease of right carotid endarterectomy has increased compared to previous exam. Since he has elevated velocity in his proximal left subclavian artery, will observe for the development of dizziness and/or steal symptoms in his UE's. Fortunately he does not smoke and his DM is in good control.  Consider intensive statin therapy which is associated with a greater reduction in CVD risk and improved endothelial function , will defer to PCP.  Plan: Follow-up in 6 months with Carotid Duplex scan.   I discussed in depth with the patient the nature of atherosclerosis, and emphasized the importance of maximal medical management including strict control of blood pressure, blood glucose, and lipid levels, obtaining regular exercise, and continued cessation of smoking.  The patient is aware that without maximal medical management the underlying atherosclerotic disease process will progress, limiting the benefit of any interventions. The patient was given information about stroke prevention and what symptoms should prompt the patient to seek immediate medical care. Thank you for allowing [place] to participate in this patient's care.  Clemon Chambers, RN, MSN, FNP-C [facility] Office: [phone]  Clinic Physician: Scot Dock  08/06/2013 11:01 AM

## 2013-08-14 ENCOUNTER — Other Ambulatory Visit: Payer: Self-pay | Admitting: *Deleted

## 2013-08-14 DIAGNOSIS — I6529 Occlusion and stenosis of unspecified carotid artery: Secondary | ICD-10-CM

## 2014-02-03 ENCOUNTER — Encounter: Payer: Self-pay | Admitting: Family

## 2014-02-04 ENCOUNTER — Ambulatory Visit (HOSPITAL_COMMUNITY)
Admission: RE | Admit: 2014-02-04 | Discharge: 2014-02-04 | Disposition: A | Discharge status: Home / Self Care | Payer: BC Managed Care – PPO | Source: Ambulatory Visit | Attending: Family | Admitting: Family

## 2014-02-04 ENCOUNTER — Ambulatory Visit (INDEPENDENT_AMBULATORY_CARE_PROVIDER_SITE_OTHER): Payer: BC Managed Care – PPO | Admitting: Family

## 2014-02-04 ENCOUNTER — Encounter: Payer: Self-pay | Admitting: Family

## 2014-02-04 VITALS — BP 160/99 | HR 86 | Resp 16 | Ht 70.0 in | Wt 219.0 lb

## 2014-02-04 DIAGNOSIS — E785 Hyperlipidemia, unspecified: Secondary | ICD-10-CM | POA: Insufficient documentation

## 2014-02-04 DIAGNOSIS — E119 Type 2 diabetes mellitus without complications: Secondary | ICD-10-CM | POA: Insufficient documentation

## 2014-02-04 DIAGNOSIS — I6529 Occlusion and stenosis of unspecified carotid artery: Secondary | ICD-10-CM | POA: Insufficient documentation

## 2014-02-04 DIAGNOSIS — Z48812 Encounter for surgical aftercare following surgery on the circulatory system: Secondary | ICD-10-CM

## 2014-02-04 NOTE — Progress Notes (Addendum)
Established Carotid Patient   History of Present Illness  Daniel Gibson is a 63 y.o. male patient of Dr. Scot Dock who underwent a right carotid endarterectomy on 06/07/2012.  He returns today for follow up.  Patient has Negative history of TIA or stroke symptom. The patient denies amaurosis fugax or monocular blindness. The patient denies facial drooping.  Pt. denies hemiplegia. The patient denies receptive or expressive aphasia. Pt. denies extremity weakness.  He states his blood pressure is usually about 120/75.  He had an MI in 1993, no CABG or stent, medical management, nuclear stress test last August, states good result  Pt denies New Medical or Surgical History.  He states he does lots of lifting at work.  Pt Diabetic: Yes, states his last A1C was 7.2 in July, 2015 Pt smoker: non-smoker   Pt meds include:  Statin : Yes, states his cholesterol is in good control  ASA: Yes  Other anticoagulants/antiplatelets: no   Past Medical History  Diagnosis Date  . Diabetes mellitus without complication   . Carotid artery occlusion   . Cancer     prostate  . CAD (coronary artery disease)   . Hyperlipidemia   . Hypertension   . Thrombocytopenia   . Myocardial infarction 1993    medically managed    Social History History  Substance Use Topics  . Smoking status: Never Smoker   . Smokeless tobacco: Never Used  . Alcohol Use: No    Family History Family History  Problem Relation Age of Onset  . Cancer Mother   . Hyperlipidemia Mother   . Hypertension Mother   . Heart attack Father   . Heart disease Father     Heart Disease before age 17  . Hyperlipidemia Father   . Hypertension Father   . Diabetes Sister   . Heart disease Sister     before age 68  . Hypertension Sister   . Hyperlipidemia Sister   . Diabetes Brother   . Heart disease Brother     before age 4  . Hyperlipidemia Brother   . Hypertension Brother     Surgical History Past Surgical History   Procedure Laterality Date  . Appendectomy    . Transperineal implant of radiation seeds w/ ultrasound    . Endarterectomy  06/07/2012    Procedure: ENDARTERECTOMY CAROTID;  Surgeon: Angelia Mould, MD;  Location: [facility] OR;  Service: Vascular;  Laterality: Right;  Right Carotid Endarterectomy with Patch Angioplasty  . Carotid endarterectomy Right 06-07-12    cea    Allergies  Allergen Reactions  . Tetanus Toxoids Swelling    Current Outpatient Prescriptions  Medication Sig Dispense Refill  . amLODipine (NORVASC) 10 MG tablet Take 10 mg by mouth daily.      [name] aspirin 325 MG tablet Take 325 mg by mouth daily.      [name] atorvastatin (LIPITOR) 40 MG tablet Take 40 mg by mouth daily. Patient uses this medication at bedtime.      . carvedilol (COREG) 25 MG tablet Take 12.5 mg by mouth 2 (two) times daily with a meal.      . fesoterodine (TOVIAZ) 4 MG TB24 tablet Take 4 mg by mouth daily.      . metFORMIN (GLUCOPHAGE) 1000 MG tablet Take 1,000 mg by mouth 2 (two) times daily with a meal.      . niacin 500 MG tablet Take 2,000 mg by mouth daily with breakfast. Patient uses 2000 mg at bedtime.      [name]  nitroGLYCERIN (NITROSTAT) 0.4 MG SL tablet Place 0.4 mg under the tongue every 5 (five) minutes as needed.      [name] oxyCODONE-acetaminophen (ROXICET) 5-325 MG per tablet Take 1 tablet by mouth every 4 (four) hours as needed for pain.  30 tablet  0  . ramipril (ALTACE) 10 MG capsule Take 10 mg by mouth daily.      . Tamsulosin HCl (FLOMAX) 0.4 MG CAPS Take 0.4 mg by mouth daily.       No current facility-administered medications for this visit.    Review of Systems : See HPI for pertinent positives and negatives.  Physical Examination  Filed Vitals:   02/04/14 0948 02/04/14 0951  BP: 161/96 160/99  Pulse: 85 86  Resp:  16  Height:  5\' 10"  (1.778 m)  Weight:  219 lb (99.338 kg)  SpO2:  98%   Body mass index is 31.42 kg/(m^2).  General: WDWN obese male in NAD  GAIT: normal  Eyes:  PERRLA  Pulmonary: Non-labored, CTAB, Negative Rales, Negative rhonchi, & Negative wheezing.  Cardiac: regular Rhythm , Negative detected murmur.   VASCULAR EXAM  Carotid Bruits  Left  Right    Negative  Negative   Radial pulses are 2+ palpable and equal.  LE Pulses  LEFT  RIGHT   POPLITEAL  not palpable  not palpable   POSTERIOR TIBIAL  palpable  palpable   DORSALIS PEDIS  ANTERIOR TIBIAL   palpable   palpable    Gastrointestinal: soft, nontender, BS WNL, no r/g, negative masses, obese.  Musculoskeletal: Negative muscle atrophy/wasting. M/S 5/5 throughout, Extremities without ischemic changes.  Neurologic: A&O X 3; Appropriate Affect ; SENSATION ;normal;  Speech is normal  CN 2-12 intact , Pain and light touch intact in extremities, Motor exam as listed above.    Non-Invasive Vascular Imaging CAROTID DUPLEX 02/04/2014 CEREBROVASCULAR DUPLEX EVALUATION    INDICATION: Carotid artery disease     PREVIOUS INTERVENTION(S): Right carotid endarterectomy 06/07/2012.    DUPLEX EXAM:     RIGHT  LEFT  Peak Systolic Velocities (cm/s) End Diastolic Velocities (cm/s) Plaque LOCATION Peak Systolic Velocities (cm/s) End Diastolic Velocities (cm/s) Plaque  74 17  CCA PROXIMAL 76 17   75 20  CCA MID 66 17   73 19  CCA DISTAL 60 16   140 24  ECA 192 30   55 18 HM ICA PROXIMAL 93 36 HT  121 42 HM ICA MID 91 34   117 41  ICA DISTAL 84 33      ICA / CCA Ratio (PSV) 1.40  Antegrade  Vertebral Flow Antegrade   517 Brachial Systolic Pressure (mmHg) 616  Triphasic  Brachial Artery Waveforms Triphasic     Plaque Morphology:  HM = Homogeneous, HT = Heterogeneous, CP = Calcific Plaque, SP = Smooth Plaque, IP = Irregular Plaque     ADDITIONAL FINDINGS: Known stenosis of the left proximal subclavian artery.    IMPRESSION: Patent right carotid endarterectomy site with evidence of moderate hyperplasia at the distal patch resulting in a 40-59%. Left internal carotid artery velocities suggest a  <40% stenosis.      Compared to the previous exam:  No significant change in velocities when compared to the last exam on 08/06/2013.      Assessment: Daniel Gibson is a 63 y.o. male who presents with asymptomatic patent right carotid endarterectomy site with evidence of moderate hyperplasia at the distal patch resulting in a 40-59%. Right ICA stenosis 6 months ago was 60-79%.  Left internal carotid artery velocities suggest a <40% stenosis.   No significant change in velocities when compared to the last exam on 08/06/2013.   Plan: Follow-up in 6 months with Carotid Duplex scan.   I discussed in depth with the patient the nature of atherosclerosis, and emphasized the importance of maximal medical management including strict control of blood pressure, blood glucose, and lipid levels, obtaining regular exercise, and continued cessation of smoking.  The patient is aware that without maximal medical management the underlying atherosclerotic disease process will progress, limiting the benefit of any interventions. The patient was given information about stroke prevention and what symptoms should prompt the patient to seek immediate medical care. Thank you for allowing [place] to participate in this patient's care.  Clemon Chambers, RN, MSN, FNP-C [facility] Office: [phone]  Clinic Physician: Scot Dock  02/04/2014 9:38 AM

## 2014-02-04 NOTE — Patient Instructions (Signed)
Stroke Prevention Some medical conditions and behaviors are associated with an increased chance of having a stroke. You may prevent a stroke by making healthy choices and managing medical conditions. HOW CAN I REDUCE MY RISK OF HAVING A STROKE?   Stay physically active. Get at least 30 minutes of activity on most or all days.  Do not smoke. It may also be helpful to avoid exposure to secondhand smoke.  Limit alcohol use. Moderate alcohol use is considered to be:  No more than 2 drinks per day for men.  No more than 1 drink per day for nonpregnant women.  Eat healthy foods. This involves:  Eating 5 or more servings of fruits and vegetables a day.  Making dietary changes that address high blood pressure (hypertension), high cholesterol, diabetes, or obesity.  Manage your cholesterol levels.  Making food choices that are high in fiber and low in saturated fat, trans fat, and cholesterol may control cholesterol levels.  Take any prescribed medicines to control cholesterol as directed by your health care provider.  Manage your diabetes.  Controlling your carbohydrate and sugar intake is recommended to manage diabetes.  Take any prescribed medicines to control diabetes as directed by your health care provider.  Control your hypertension.  Making food choices that are low in salt (sodium), saturated fat, trans fat, and cholesterol is recommended to manage hypertension.  Take any prescribed medicines to control hypertension as directed by your health care provider.  Maintain a healthy weight.  Reducing calorie intake and making food choices that are low in sodium, saturated fat, trans fat, and cholesterol are recommended to manage weight.  Stop drug abuse.  Avoid taking birth control pills.  Talk to your health care provider about the risks of taking birth control pills if you are over 35 years old, smoke, get migraines, or have ever had a blood clot.  Get evaluated for sleep  disorders (sleep apnea).  Talk to your health care provider about getting a sleep evaluation if you snore a lot or have excessive sleepiness.  Take medicines only as directed by your health care provider.  For some people, aspirin or blood thinners (anticoagulants) are helpful in reducing the risk of forming abnormal blood clots that can lead to stroke. If you have the irregular heart rhythm of atrial fibrillation, you should be on a blood thinner unless there is a good reason you cannot take them.  Understand all your medicine instructions.  Make sure that other conditions (such as anemia or atherosclerosis) are addressed. SEEK IMMEDIATE MEDICAL CARE IF:   You have sudden weakness or numbness of the face, arm, or leg, especially on one side of the body.  Your face or eyelid droops to one side.  You have sudden confusion.  You have trouble speaking (aphasia) or understanding.  You have sudden trouble seeing in one or both eyes.  You have sudden trouble walking.  You have dizziness.  You have a loss of balance or coordination.  You have a sudden, severe headache with no known cause.  You have new chest pain or an irregular heartbeat. Any of these symptoms may represent a serious problem that is an emergency. Do not wait to see if the symptoms will go away. Get medical help at once. Call your local emergency services (911 in [place] Do not drive yourself to the hospital. Document Released: 07/20/2004 Document Revised: 10/27/2013 Document Reviewed: 12/13/2012 [employer] Patient Information 2015 [employer]. This information is not intended to replace advice given   to you by your health care provider. Make sure you discuss any questions you have with your health care provider.  

## 2014-02-04 NOTE — Addendum Note (Signed)
Addended by: Mena Goes on: 02/04/2014 05:36 PM   Modules accepted: Orders

## 2014-08-12 ENCOUNTER — Ambulatory Visit: Payer: BC Managed Care – PPO | Admitting: Family

## 2014-08-12 ENCOUNTER — Other Ambulatory Visit (HOSPITAL_COMMUNITY): Payer: BC Managed Care – PPO

## 2014-08-13 ENCOUNTER — Encounter: Payer: Self-pay | Admitting: Family

## 2014-08-14 ENCOUNTER — Ambulatory Visit: Payer: Self-pay | Admitting: Family

## 2014-08-14 ENCOUNTER — Other Ambulatory Visit (HOSPITAL_COMMUNITY): Payer: Self-pay

## 2015-04-19 ENCOUNTER — Ambulatory Visit (INDEPENDENT_AMBULATORY_CARE_PROVIDER_SITE_OTHER): Payer: BLUE CROSS/BLUE SHIELD | Admitting: Allergy and Immunology

## 2015-04-19 ENCOUNTER — Encounter: Payer: Self-pay | Admitting: Allergy and Immunology

## 2015-04-19 VITALS — BP 128/80 | HR 80 | Temp 97.9°F | Resp 16

## 2015-04-19 DIAGNOSIS — T7840XA Allergy, unspecified, initial encounter: Secondary | ICD-10-CM | POA: Diagnosis not present

## 2015-04-19 DIAGNOSIS — M25475 Effusion, left foot: Secondary | ICD-10-CM

## 2015-04-19 DIAGNOSIS — T783XXA Angioneurotic edema, initial encounter: Secondary | ICD-10-CM

## 2015-04-19 MED ORDER — EPINEPHRINE 0.3 MG/0.3ML IJ SOAJ: 0.3000 mg | Freq: Once | INTRAMUSCULAR | Status: DC

## 2015-04-19 NOTE — Patient Instructions (Signed)
  1. Avoidance measures  2. For allergic reaction: Epi-Pen, Benadryl, MD / ER  3. No ACE inhibitor, No ARBs  4. Review previous September blood tests from Dr. Lisbeth Ply  5. May need to consider discontinuing Glimepiride if swelling problem continues  6. Will need further evaluation for foot swelling if improvement does not continue  7. Get a flu vaccine

## 2015-04-19 NOTE — Progress Notes (Signed)
[place]    NEW PATIENT NOTE  Referring provider: Hamrick, Daniel Mercy, MD PCP: Daniel Sake, MD    Subjective:   Patient ID: Daniel Gibson is a 64 y.o. male with a chief complaint of New Patient (Initial Visit)  and the following problems:  HPI Comments: Daniel Gibson is a 64 year old male who has had problems with swelling. Apparently in July he developed a significant episode of tongue swelling requiring him to go to the emergency room and get treated with systemic steroids. At that point in time his ramipril was discontinued. He was changed to losartan and did well until September at which time he developed another episode of tongue swelling and his losartan was discontinued. He also received systemic steroids in the emergency room in September. There was no associated systemic or constitutional symptoms. There was no obvious trigger. However, he did develop left foot swelling. Initially this was red and tender and red required a course of antibiotics. This developed sometime between his July episode in September episode. Eventually sounds as though it started very early in September. It is improving with each passing week. However, it has not completely resolved. He apparently had a blood test for hyper uricemia which was negative. He had a few other blood tests performed in September although he does not have the results of this testing. It should be noted that can this started a medication about one month prior to the onset of his initial episode of swelling. This medication was glimepiride.   Diagnosis  . Diabetes mellitus without complication ([place])  . Carotid artery occlusion  . Cancer ([place])     . CAD (coronary artery disease)  . Hyperlipidemia  . Hypertension  . Thrombocytopenia ([place])  . Myocardial infarction [facility])       Past Surgical History  Procedure Laterality Date  . Appendectomy    . Transperineal implant of  radiation seeds w/ ultrasound    . Endarterectomy  06/07/2012    Procedure: ENDARTERECTOMY CAROTID;  Surgeon: Daniel Mould, MD;  Location: [facility] OR;  Service: Vascular;  Laterality: Right;  Right Carotid Endarterectomy with Patch Angioplasty  . Carotid endarterectomy Right 06-07-12    cea    Medication Sig  . amLODipine (NORVASC) 10 MG tablet Take 10 mg by mouth daily.  [name] aspirin 325 MG tablet Take 325 mg by mouth daily.  . carvedilol (COREG) 25 MG tablet Take 12.5 mg by mouth 2 (two) times daily with a meal.  . metFORMIN (GLUCOPHAGE) 1000 MG tablet Take 1,000 mg by mouth 2 (two) times daily with a meal.  . nitroGLYCERIN (NITROSTAT) 0.4 MG SL tablet Place 0.4 mg under the tongue every 5 (five) minutes as needed.  [name] atorvastatin (LIPITOR) 40 MG tablet Take 40 mg by mouth daily. Patient uses this medication at bedtime.  . fesoterodine (TOVIAZ) 4 MG TB24 tablet Take 8 mg by mouth daily.   . niacin 500 MG tablet Take 2,000 mg by mouth daily with breakfast. Patient uses 2000 mg at bedtime.  [name] oxyCODONE-acetaminophen (ROXICET) 5-325 MG per tablet Take 1 tablet by mouth every 4 (four) hours as needed for pain. (Patient not taking: Reported on 04/19/2015)  . ramipril (ALTACE) 10 MG capsule Take 10 mg by mouth daily.  . Tamsulosin HCl (FLOMAX) 0.4 MG CAPS Take 0.4 mg by mouth daily.   No facility-administered medications prior to visit.    Meds ordered this encounter  Medications  . EPINEPHrine (EPIPEN  2-PAK) 0.3 mg/0.3 mL IJ SOAJ injection    Sig: Inject 0.3 mLs (0.3 mg total) into the muscle once.    Dispense:  1 Device    Refill:  1    Allergies  Allergen Reactions  . Ace Inhibitors Swelling  . Tetanus Toxoids Swelling    Review of Systems  Constitutional: Negative for fever, chills, weight loss and malaise/fatigue.  HENT: Negative for congestion, ear discharge, ear pain, hearing loss, nosebleeds, sore throat and tinnitus.   Eyes: Negative for blurred vision, pain, discharge  and redness.  Respiratory: Negative for cough, hemoptysis, sputum production, shortness of breath, wheezing and stridor.   Cardiovascular: Negative for chest pain, palpitations and leg swelling.  Gastrointestinal: Negative for heartburn, nausea, vomiting, abdominal pain and diarrhea.  Genitourinary: Negative for dysuria.  Musculoskeletal: Negative for myalgias, back pain and joint pain.  Skin: Positive for rash. Negative for itching.  Neurological: Negative for dizziness, weakness and headaches.  Endo/Heme/Allergies: Does not bruise/bleed easily.    Problem Relation  . Cancer Mother  . Hyperlipidemia Mother  . Hypertension Mother  . Heart attack Father  . Heart disease Father     . Hyperlipidemia Father  . Hypertension Father  . Diabetes Sister  . Heart disease Sister     . Hypertension Sister  . Hyperlipidemia Sister  . Diabetes Brother  . Heart disease Brother     . Hyperlipidemia Brother  . Hypertension Brother    Social History   Social History  . Marital Status: Married    Spouse Name: N/A  . Number of Children: N/A  . Years of Education: N/A   Occupational History  . Not on file.   Social History Main Topics  . Smoking status: Never Smoker   . Smokeless tobacco: Never Used  . Alcohol Use: No  . Drug Use: No  . Sexual Activity: Not on file   Other Topics Concern  . Not on file   Social History Narrative    Environmental and Social history  Daniel Gibson lives in a house with a dry environment, dogs located inside the household, no carpeting in the bedroom, sleeping on stuff mattresses without any plastic for the beds were pillows. There are no smoking ongoing with inside the household. He works as a Physiological scientist.   Objective:   Filed Vitals:   04/19/15 0851  BP: 128/80  Pulse: 80  Temp: 97.9 F (36.6 C)  Resp: 16        Physical Exam  Constitutional: He is well-developed, well-nourished, and in no distress. No distress.  HENT:  Head:  Normocephalic and atraumatic. Head is without laceration, without right periorbital erythema and without left periorbital erythema. Hair is normal.  Right Ear: Tympanic membrane, external ear and ear canal normal. No lacerations. No drainage or tenderness. No foreign bodies. Tympanic membrane is not injected, not scarred, not perforated, not erythematous, not retracted and not bulging. No middle ear effusion.  Left Ear: Tympanic membrane, external ear and ear canal normal. No lacerations. No drainage or tenderness. No foreign bodies. Tympanic membrane is not injected, not scarred, not perforated, not erythematous, not retracted and not bulging.  No middle ear effusion.  Nose: Nose normal. No mucosal edema, rhinorrhea, nose lacerations, sinus tenderness, nasal deformity, septal deviation or nasal septal hematoma. No epistaxis.  No foreign bodies.  Mouth/Throat: Uvula is midline, oropharynx is clear and moist and mucous membranes are normal. No oral lesions. No uvula swelling. No oropharyngeal exudate, posterior oropharyngeal edema, posterior oropharyngeal erythema  or tonsillar abscesses.  Eyes: Conjunctivae and lids are normal. Pupils are equal, round, and reactive to light. Right eye exhibits no discharge and no exudate. No foreign body present in the right eye. Left eye exhibits no discharge and no exudate. No foreign body present in the left eye. Right conjunctiva is not injected. Right conjunctiva has no hemorrhage. Left conjunctiva is not injected. Left conjunctiva has no hemorrhage. No scleral icterus.  Neck: No tracheal tenderness present. No tracheal deviation present. No thyromegaly present.  Cardiovascular: Normal rate, regular rhythm, S1 normal, S2 normal and normal heart sounds.  Exam reveals no gallop and no friction rub.   No murmur heard. Pulmonary/Chest: Effort normal and breath sounds normal. No stridor. No respiratory distress. He has no wheezes. He has no rhonchi. He has no rales. He  exhibits no tenderness.  Abdominal: Soft. He exhibits no distension and no mass. There is no tenderness. There is no rebound and no guarding.  Musculoskeletal: He exhibits edema. He exhibits no tenderness.  Left foot with significant swelling and slight erythema affecting the dorsum and lateral aspect. No tenderness.  Lymphadenopathy:    He has no cervical adenopathy.  Neurological: He is alert. Gait normal.  Skin: No purpura and no rash noted. Rash is not macular, not maculopapular, not nodular, not pustular, not vesicular and not urticarial. He is not diaphoretic. No cyanosis or erythema. No pallor. Nails show no clubbing.  Psychiatric: Mood, affect and judgment normal.    Diagnostics:  Allergy skin tests were performed. He did not demonstrate any hypersensitivity to a screening panel of aeroallergens or foods  Assessment and Plan:    1. Angioedema, initial encounter   2. Allergic reaction, initial encounter   3. Swelling of foot joint, left     1. Avoidance measures  2. For allergic reaction: Epi-Pen, Benadryl, MD / ER  3. No ACE inhibitor, No ARBs  4. Review previous September blood tests from Dr. Lisbeth Ply  5. May need to consider discontinuing Glimepiride if swelling problem continues  6. Will need further evaluation for foot swelling if improvement does not continue  7. Get a flu vaccine  It is not entirely clear why Bevin has had 2 episodes of angioedema. Of course this could be a drug side effect including a side effect directed against both a ACE inhibitor and a arb but is well we will need to consider the possibility that he is developing some degree of hypersensitivity directed against his diabetic medication. For now we will just see what happens as we move forward and he no longer uses the 2 medications for his hypertension. Today his blood pressure was quite good. He will follow this to make a determination about whether or not he needs medications for hypertension.  I will review the blood tests that he had in September make a decision about further evaluation pending that review. Given his history of prostate cancer we need to be cognizant of the fact that he may be having some immunological hyperreactivity and angioedema based upon complement metabolism dysfunction. Concerning his foot, it is improving. Thus, I think we can hold off on any further evaluation or treatment at this point. I've informed Greogory that if his foot does not improve completely then he is going to require more evaluation for this issue. I informed him that within 2 weeks his foot should be back to normal and he should contact me if not.    Allena Katz, MD [place]

## 2015-05-13 ENCOUNTER — Encounter: Payer: Self-pay | Admitting: Allergy and Immunology

## 2015-05-13 ENCOUNTER — Ambulatory Visit (INDEPENDENT_AMBULATORY_CARE_PROVIDER_SITE_OTHER): Payer: BLUE CROSS/BLUE SHIELD | Admitting: Allergy and Immunology

## 2015-05-13 VITALS — BP 142/90 | HR 64 | Resp 20

## 2015-05-13 DIAGNOSIS — L501 Idiopathic urticaria: Secondary | ICD-10-CM | POA: Diagnosis not present

## 2015-05-13 DIAGNOSIS — T7840XA Allergy, unspecified, initial encounter: Secondary | ICD-10-CM | POA: Insufficient documentation

## 2015-05-13 DIAGNOSIS — T783XXD Angioneurotic edema, subsequent encounter: Secondary | ICD-10-CM

## 2015-05-13 DIAGNOSIS — T783XXA Angioneurotic edema, initial encounter: Secondary | ICD-10-CM | POA: Insufficient documentation

## 2015-05-13 DIAGNOSIS — T7840XD Allergy, unspecified, subsequent encounter: Secondary | ICD-10-CM | POA: Diagnosis not present

## 2015-05-13 MED ORDER — EPINEPHRINE 0.3 MG/0.3ML IJ SOAJ: 0.3000 mg | Freq: Once | INTRAMUSCULAR | Status: DC

## 2015-05-13 NOTE — Patient Instructions (Addendum)
  1. Epi-Pen if needed.  2. Preventative treatment:   A. Loratadine 10mg  one tablet two times per day  B. Ranitidine 150mg  one tablet two times per day  3. Blood - tryptase, alpha-gal panel, cbc w/diff, cmp, sed, u/a  4. Further treatment? - Return in 8 weeks or earlier if problem

## 2015-05-13 NOTE — Progress Notes (Signed)
[facility]  Follow-up Note  Refering Provider: Leonides Sake, MD Primary Provider: Leonides Sake, MD  Subjective:   Daniel Gibson is a 64 y.o. male who returns to the [place] [facility] in re-evaluation of the following:  HPI Comments:  Can returns to this clinic in reevaluation of his recurrent angioedema. He developed another episode of tongue swelling requiring emergency room evaluation, delivery of epinephrine, and administration of systemic steroids. At that point time a C1 esterase inhibitor level was obtained which was normal as was functional status as well as a C4 level. Once again there was no obvious provoking factor giving rise to this reaction. He was not on any preventative medications at that point in time.   Outpatient Encounter Prescriptions as of 05/13/2015  Medication Sig  . amLODipine (NORVASC) 10 MG tablet Take 10 mg by mouth daily.  [name] aspirin 325 MG tablet Take 325 mg by mouth daily.  [name] atorvastatin (LIPITOR) 40 MG tablet Take 40 mg by mouth daily. Patient uses this medication at bedtime.  . carvedilol (COREG) 25 MG tablet Take 12.5 mg by mouth 2 (two) times daily with a meal.  . EPINEPHrine (EPIPEN 2-PAK) 0.3 mg/0.3 mL IJ SOAJ injection Inject 0.3 mg into the muscle once.  [name] EPINEPHrine (EPIPEN 2-PAK) 0.3 mg/0.3 mL IJ SOAJ injection Inject 0.3 mLs (0.3 mg total) into the muscle once.  [name] glimepiride (AMARYL) 2 MG tablet Take 2 mg by mouth daily.  [name] losartan (COZAAR) 50 MG tablet Take 50 mg by mouth daily.  . metFORMIN (GLUCOPHAGE) 1000 MG tablet Take 1,000 mg by mouth 2 (two) times daily with a meal.  . nitroGLYCERIN (NITROSTAT) 0.4 MG SL tablet Place 0.4 mg under the tongue every 5 (five) minutes as needed.  . trospium (SANCTURA) 20 MG tablet Take 20 mg by mouth daily.  . [DISCONTINUED] EPINEPHrine (EPIPEN 2-PAK) 0.3 mg/0.3 mL IJ SOAJ injection Inject 0.3 mLs (0.3 mg total) into the muscle  once.  . fesoterodine (TOVIAZ) 4 MG TB24 tablet Take 8 mg by mouth daily.   . niacin 500 MG tablet Take 2,000 mg by mouth daily with breakfast. Patient uses 2000 mg at bedtime.  [name] oxyCODONE-acetaminophen (ROXICET) 5-325 MG per tablet Take 1 tablet by mouth every 4 (four) hours as needed for pain. (Patient not taking: Reported on 04/19/2015)  . ramipril (ALTACE) 10 MG capsule Take 10 mg by mouth daily.  . Tamsulosin HCl (FLOMAX) 0.4 MG CAPS Take 0.4 mg by mouth daily.   No facility-administered encounter medications on file as of 05/13/2015.    Meds ordered this encounter  Medications  . EPINEPHrine (EPIPEN 2-PAK) 0.3 mg/0.3 mL IJ SOAJ injection    Sig: Inject 0.3 mLs (0.3 mg total) into the muscle once.    Dispense:  4 Device    Refill:  3    Past Medical History  Diagnosis Date  . Diabetes mellitus without complication ([place])   . Carotid artery occlusion   . Cancer [facility])     prostate  . CAD (coronary artery disease)   . Hyperlipidemia   . Hypertension   . Thrombocytopenia ([place])   . Myocardial infarction [facility]) 1993    medically managed    Past Surgical History  Procedure Laterality Date  . Appendectomy    . Transperineal implant of radiation seeds w/ ultrasound    . Endarterectomy  06/07/2012    Procedure: ENDARTERECTOMY CAROTID;  Surgeon: Angelia Mould, MD;  Location: [facility] OR;  Service: Vascular;  Laterality: Right;  Right Carotid Endarterectomy with Patch Angioplasty  . Carotid endarterectomy Right 06-07-12    cea    Allergies  Allergen Reactions  . Ace Inhibitors Swelling  . Tetanus Toxoids Swelling    Review of Systems  Constitutional: Negative.   HENT: Negative.   Eyes: Negative.   Respiratory: Negative.   Cardiovascular: Negative.   Gastrointestinal: Negative.   Musculoskeletal: Negative.   Skin: Negative.      Objective:   Filed Vitals:   05/13/15 1100  BP: 142/90  Pulse: 64  Resp: 20          Physical Exam  Constitutional: He  appears well-developed and well-nourished. No distress.  HENT:  Head: Normocephalic and atraumatic. Head is without right periorbital erythema and without left periorbital erythema.  Right Ear: Tympanic membrane, external ear and ear canal normal. No drainage or tenderness. No foreign bodies. Tympanic membrane is not injected, not scarred, not perforated, not erythematous, not retracted and not bulging. No middle ear effusion.  Left Ear: Tympanic membrane, external ear and ear canal normal. No drainage or tenderness. No foreign bodies. Tympanic membrane is not injected, not scarred, not perforated, not erythematous, not retracted and not bulging.  No middle ear effusion.  Nose: Nose normal. No mucosal edema, rhinorrhea, nose lacerations or sinus tenderness.  No foreign bodies.  Mouth/Throat: Oropharynx is clear and moist. No oropharyngeal exudate, posterior oropharyngeal edema, posterior oropharyngeal erythema or tonsillar abscesses.  Eyes: Lids are normal. Right eye exhibits no chemosis, no discharge and no exudate. No foreign body present in the right eye. Left eye exhibits no chemosis, no discharge and no exudate. No foreign body present in the left eye. Right conjunctiva is not injected. Left conjunctiva is not injected.  Neck: Neck supple. No tracheal tenderness present. No tracheal deviation and no edema present. No thyroid mass and no thyromegaly present.  Cardiovascular: Normal rate, regular rhythm, S1 normal and S2 normal.  Exam reveals no gallop.   No murmur heard. Pulmonary/Chest: No accessory muscle usage or stridor. No respiratory distress. He has no wheezes. He has no rhonchi. He has no rales.  Abdominal: Soft.  Lymphadenopathy:       Head (right side): No tonsillar adenopathy present.       Head (left side): No tonsillar adenopathy present.    He has no cervical adenopathy.  Neurological: He is alert.  Skin: No rash noted. He is not diaphoretic.  Psychiatric: He has a normal mood  and affect. His behavior is normal.    Diagnostics: None    Assessment and Plan:   1. Allergic reaction, subsequent encounter   2. Urticaria, idiopathic   3. Angioedema, subsequent encounter      1. Epi-Pen if needed.  2. Preventative treatment:   A. Loratadine 10mg  one tablet two times per day  B. Ranitidine 150mg  one tablet two times per day  3. Blood - tryptase, alpha-gal panel, cbc w/diff, cmp, sed, u/a  4. Further treatment? - Return in 8 weeks or earlier if problem  We will further investigate for a systemic disease contributing to tens recurrent episodes of angioedema with the blood tests mentioned above. It should be noted that he still continues to use glimepiride which is the only medication about was started a possibly one month prior to his initial episode of swelling. If he still continues to have recurrent swelling episodes then I think we may need to eliminate this medication.  Allena Katz, MD [place]

## 2015-06-11 ENCOUNTER — Encounter: Payer: Self-pay | Admitting: Family Medicine

## 2015-07-08 ENCOUNTER — Ambulatory Visit (INDEPENDENT_AMBULATORY_CARE_PROVIDER_SITE_OTHER): Payer: BLUE CROSS/BLUE SHIELD | Admitting: Allergy and Immunology

## 2015-07-08 ENCOUNTER — Encounter: Payer: Self-pay | Admitting: Allergy and Immunology

## 2015-07-08 VITALS — BP 130/70 | HR 72 | Resp 16

## 2015-07-08 DIAGNOSIS — L501 Idiopathic urticaria: Secondary | ICD-10-CM | POA: Diagnosis not present

## 2015-07-08 DIAGNOSIS — Z91018 Allergy to other foods: Secondary | ICD-10-CM | POA: Diagnosis not present

## 2015-07-08 DIAGNOSIS — T783XXD Angioneurotic edema, subsequent encounter: Secondary | ICD-10-CM | POA: Diagnosis not present

## 2015-07-08 NOTE — Patient Instructions (Signed)
  1. Epi-Pen if needed.  2. Preventative treatment:   A. Loratadine 10mg  one tablet two times per day  B. Ranitidine 150mg  one tablet two times per day  3. "Action Plan":   A. use EpiPen  B. take Benadryl 50 mg  C. take prednisone 20 mg single dose clinic samples  4. Further treatment? - Return in 12 weeks or earlier if problem

## 2015-07-08 NOTE — Progress Notes (Signed)
[facility]  Follow-up Note  Referring Provider: Leonides Sake, MD Primary Provider: Leonides Sake, MD Date of Office Visit: 07/08/2015  Subjective:   Daniel Gibson is a 65 y.o. male who returns to the [facility] in re-evaluation of the following:  HPI Comments:  Can returns to this clinic on 07/08/2015 in reevaluation of his allergic reactions. He had another episode of angioedema involving the right side of his tongue that was relatively mild and was treated with a home administered EpiPen and 50 mg of Benadryl and then evaluation in the emergency room. Apparently he only spent 2 hours in the emergency room and was given 3 days of steroids. His reaction occurred at 4 AM in the morning and he had complete resolution by noon. He did eat a process [place] loaf that night prior to his reaction. There was no other obvious trigger giving rise to this reaction. He did not have any urticaria with this reaction.   Current Outpatient Prescriptions on File Prior to Visit  Medication Sig Dispense Refill  . amLODipine (NORVASC) 10 MG tablet Take 10 mg by mouth daily.    [name] atorvastatin (LIPITOR) 40 MG tablet Take 40 mg by mouth daily. Patient uses this medication at bedtime.    . carvedilol (COREG) 25 MG tablet Take 12.5 mg by mouth 2 (two) times daily with a meal.    . EPINEPHrine (EPIPEN 2-PAK) 0.3 mg/0.3 mL IJ SOAJ injection Inject 0.3 mLs (0.3 mg total) into the muscle once. 4 Device 3  . glimepiride (AMARYL) 2 MG tablet Take 2 mg by mouth daily.    . metFORMIN (GLUCOPHAGE) 1000 MG tablet Take 1,000 mg by mouth 2 (two) times daily with a meal.    . nitroGLYCERIN (NITROSTAT) 0.4 MG SL tablet Place 0.4 mg under the tongue every 5 (five) minutes as needed.    . Tamsulosin HCl (FLOMAX) 0.4 MG CAPS Take 0.8 mg by mouth daily.     . trospium (SANCTURA) 20 MG tablet Take 20 mg by mouth 2 (two) times daily.     . fesoterodine  (TOVIAZ) 4 MG TB24 tablet Take 8 mg by mouth daily. Reported on 07/08/2015    . niacin 500 MG tablet Take 2,000 mg by mouth daily with breakfast. Reported on 07/08/2015    . oxyCODONE-acetaminophen (ROXICET) 5-325 MG per tablet Take 1 tablet by mouth every 4 (four) hours as needed for pain. (Patient not taking: Reported on 04/19/2015) 30 tablet 0   No current facility-administered medications on file prior to visit.    No orders of the defined types were placed in this encounter.    Past Medical History  Diagnosis Date  . Diabetes mellitus without complication ([place])   . Carotid artery occlusion   . Cancer [facility])     prostate  . CAD (coronary artery disease)   . Hyperlipidemia   . Hypertension   . Thrombocytopenia ([place])   . Myocardial infarction ([place]) 1993    medically managed  . Food allergy     Alpha Gal allergy     Past Surgical History  Procedure Laterality Date  . Appendectomy    . Transperineal implant of radiation seeds w/ ultrasound    . Endarterectomy  06/07/2012    Procedure: ENDARTERECTOMY CAROTID;  Surgeon: Angelia Mould, MD;  Location: [facility] OR;  Service: Vascular;  Laterality: Right;  Right Carotid Endarterectomy with Patch Angioplasty  . Carotid endarterectomy Right 06-07-12  cea    Allergies  Allergen Reactions  . Ace Inhibitors Swelling  . Beef-Derived Products Anaphylaxis    Alpha-Gal allergy  . Pork-Derived Products Anaphylaxis    Alpha-Gal allergy  . Sheep-Derived Products Anaphylaxis    Alpha-Gal allergy  . Tetanus Toxoids Swelling    Review of systems negative except as noted in HPI / PMHx or noted below:  Review of Systems  Constitutional: Negative.   HENT: Negative.   Eyes: Negative.   Respiratory: Negative.   Cardiovascular: Negative.   Gastrointestinal: Negative.   Genitourinary: Negative.   Musculoskeletal: Negative.   Skin: Negative.   Neurological: Negative.   Endo/Heme/Allergies: Negative.   Psychiatric/Behavioral:  Negative.      Objective:   Filed Vitals:   07/08/15 1044  BP: 130/70  Pulse: 72  Resp: 16          Physical Exam  Constitutional: He is well-developed, well-nourished, and in no distress. No distress.  HENT:  Head: Normocephalic.  Right Ear: Tympanic membrane, external ear and ear canal normal.  Left Ear: Tympanic membrane, external ear and ear canal normal.  Nose: Nose normal. No mucosal edema or rhinorrhea.  Mouth/Throat: Uvula is midline, oropharynx is clear and moist and mucous membranes are normal. No oropharyngeal exudate.  Eyes: Conjunctivae are normal.  Neck: Trachea normal. No tracheal tenderness present. No tracheal deviation present. No thyromegaly present.  Cardiovascular: Normal rate, regular rhythm, S1 normal, S2 normal and normal heart sounds.   No murmur heard. Pulmonary/Chest: Breath sounds normal. No stridor. No respiratory distress. He has no wheezes. He has no rales.  Musculoskeletal: He exhibits no edema.  Lymphadenopathy:       Head (right side): No tonsillar adenopathy present.       Head (left side): No tonsillar adenopathy present.    He has no cervical adenopathy.    He has no axillary adenopathy.  Neurological: He is alert. Gait normal.  Skin: No rash noted. He is not diaphoretic. No erythema. Nails show no clubbing.  Psychiatric: Mood and affect normal.    Diagnostics:  Results of blood tests obtained on 05/26/2015 identified an alpha gal IgE level of 1.01 kunit/liter and a beef titer at 0.44  Assessment and Plan:   1. Angioedema, subsequent encounter   2. Urticaria, idiopathic   3. Allergy to meat      1. Epi-Pen if needed.  2. Preventative treatment:   A. Loratadine 10mg  one tablet two times per day  B. Ranitidine 150mg  one tablet two times per day  3. "Action Plan":   A. use EpiPen  B. take Benadryl 50 mg  C. take prednisone 20 mg single dose clinic samples  4. Further treatment? - Return in 12 weeks or earlier if  problem   I will assume that Daniel Gibson ate a meal that had contamination with minimal protein and we will continue to have him use a combination of loratadine and ranitidine on a regular basis and I've given him an action plan to initiate should he develop another episode in the future. We'll make a determination on how to proceed regarding evaluation and treatment pending his response.    Allena Katz, MD [place]

## 2015-07-20 ENCOUNTER — Telehealth: Payer: Self-pay | Admitting: Pediatrics

## 2015-07-20 NOTE — Telephone Encounter (Signed)
Patient has a question about account # [id number]. Please call back on cell # [phone] before 5pm.

## 2015-07-20 NOTE — Telephone Encounter (Signed)
Spoke with pt - he will send $50/mo - he used to have [employer] - told him [facility] may want him to go on this again due to large balance

## 2015-10-01 ENCOUNTER — Ambulatory Visit: Payer: BLUE CROSS/BLUE SHIELD | Admitting: Allergy and Immunology

## 2015-10-04 ENCOUNTER — Ambulatory Visit (INDEPENDENT_AMBULATORY_CARE_PROVIDER_SITE_OTHER): Payer: BLUE CROSS/BLUE SHIELD | Admitting: Allergy and Immunology

## 2015-10-04 ENCOUNTER — Encounter: Payer: Self-pay | Admitting: Allergy and Immunology

## 2015-10-04 VITALS — BP 150/80 | HR 80 | Resp 16

## 2015-10-04 DIAGNOSIS — Z91018 Allergy to other foods: Secondary | ICD-10-CM | POA: Diagnosis not present

## 2015-10-04 DIAGNOSIS — T783XXD Angioneurotic edema, subsequent encounter: Secondary | ICD-10-CM | POA: Diagnosis not present

## 2015-10-04 DIAGNOSIS — L501 Idiopathic urticaria: Secondary | ICD-10-CM

## 2015-10-04 NOTE — Progress Notes (Signed)
Follow-up Note  Referring Provider: Leonides Sake, MD Primary Provider: Leonides Sake, MD Date of Office Visit: 10/04/2015  Subjective:   Daniel Gibson (DOB: 1951-06-03) is a 65 y.o. male who returns to the [facility] [place] on 10/04/2015 in re-evaluation of the following:  HPI Comments: Daniel Gibson returns to this clinic in evaluation of his recurrent allergic reactions and mammal allergy. He is done very well over the course of the past 12 months without any reaction at all. He stays away from all mammal. He does consistently use his loratadine and ranitidine. He continues to have a EpiPen handy.     Medication List           amLODipine 10 MG tablet  Commonly known as:  NORVASC  Take 10 mg by mouth daily.     aspirin 81 MG tablet  Take 81 mg by mouth daily.     atorvastatin 40 MG tablet  Commonly known as:  LIPITOR  Take 40 mg by mouth daily. Patient uses this medication at bedtime.     carvedilol 25 MG tablet  Commonly known as:  COREG  Take 12.5 mg by mouth 2 (two) times daily with a meal.     EPINEPHrine 0.3 mg/0.3 mL Soaj injection  Commonly known as:  EPIPEN 2-PAK  Inject 0.3 mLs (0.3 mg total) into the muscle once.     glimepiride 2 MG tablet  Commonly known as:  AMARYL  Take 2 mg by mouth daily.     loratadine 10 MG tablet  Commonly known as:  CLARITIN  Take 10 mg by mouth 2 (two) times daily.     metFORMIN 1000 MG tablet  Commonly known as:  GLUCOPHAGE  Take 1,000 mg by mouth 2 (two) times daily with a meal.     niacin 500 MG tablet  Take 2,000 mg by mouth daily with breakfast. Reported on 10/04/2015     nitroGLYCERIN 0.4 MG SL tablet  Commonly known as:  NITROSTAT  Place 0.4 mg under the tongue every 5 (five) minutes as needed.     oxyCODONE-acetaminophen 5-325 MG tablet  Commonly known as:  ROXICET  Take 1 tablet by mouth every 4 (four) hours as needed for pain.     ranitidine 150 MG tablet  Commonly known as:  ZANTAC  Take 150  mg by mouth 2 (two) times daily.     tamsulosin 0.4 MG Caps capsule  Commonly known as:  FLOMAX  Take 0.8 mg by mouth daily.     TOVIAZ 4 MG Tb24 tablet  Generic drug:  fesoterodine  Take 8 mg by mouth daily. Reported on 10/04/2015     trospium 20 MG tablet  Commonly known as:  SANCTURA  Take 20 mg by mouth 2 (two) times daily.        Past Medical History  Diagnosis Date  . Diabetes mellitus without complication ([place])   . Carotid artery occlusion   . Cancer [facility])     prostate  . CAD (coronary artery disease)   . Hyperlipidemia   . Hypertension   . Thrombocytopenia ([place])   . Myocardial infarction ([place]) 1993    medically managed  . Food allergy     Alpha Gal allergy     Past Surgical History  Procedure Laterality Date  . Appendectomy    . Transperineal implant of radiation seeds w/ ultrasound    . Endarterectomy  06/07/2012    Procedure: ENDARTERECTOMY CAROTID;  Surgeon: Judeth Cornfield  Scot Dock, MD;  Location: [facility] OR;  Service: Vascular;  Laterality: Right;  Right Carotid Endarterectomy with Patch Angioplasty  . Carotid endarterectomy Right 06-07-12    cea    Allergies  Allergen Reactions  . Ace Inhibitors Swelling  . Beef-Derived Products Anaphylaxis    Alpha-Gal allergy  . Pork-Derived Products Anaphylaxis    Alpha-Gal allergy  . Sheep-Derived Products Anaphylaxis    Alpha-Gal allergy  . Tetanus Toxoids Swelling    Review of systems negative except as noted in HPI / PMHx or noted below:  Review of Systems  Constitutional: Negative.   HENT: Negative.   Eyes: Negative.   Respiratory: Negative.   Cardiovascular: Negative.   Gastrointestinal: Negative.   Genitourinary: Negative.   Musculoskeletal: Negative.   Skin: Negative.   Neurological: Negative.   Endo/Heme/Allergies: Negative.   Psychiatric/Behavioral: Negative.      Objective:   Filed Vitals:   10/04/15 1042  BP: 150/80  Pulse: 80  Resp: 16          Physical Exam  Constitutional: He  is well-developed, well-nourished, and in no distress.  HENT:  Head: Normocephalic.  Right Ear: Tympanic membrane, external ear and ear canal normal.  Left Ear: Tympanic membrane, external ear and ear canal normal.  Nose: Nose normal. No mucosal edema or rhinorrhea.  Mouth/Throat: Uvula is midline, oropharynx is clear and moist and mucous membranes are normal. No oropharyngeal exudate.  Eyes: Conjunctivae are normal.  Neck: Trachea normal. No tracheal tenderness present. No tracheal deviation present. No thyromegaly present.  Cardiovascular: Normal rate, regular rhythm, S1 normal, S2 normal and normal heart sounds.   No murmur heard. Pulmonary/Chest: Breath sounds normal. No stridor. No respiratory distress. He has no wheezes. He has no rales.  Musculoskeletal: He exhibits no edema.  Lymphadenopathy:       Head (right side): No tonsillar adenopathy present.       Head (left side): No tonsillar adenopathy present.    He has no cervical adenopathy.  Neurological: He is alert. Gait normal.  Skin: No rash noted. He is not diaphoretic. No erythema. Nails show no clubbing.  Psychiatric: Mood and affect normal.    Diagnostics: None   Assessment and Plan:   1. Allergy to meat   2. Urticaria, idiopathic   3. Angioedema, subsequent encounter     1. Epi-Pen if needed.  2. Preventative treatment:   A. Loratadine 10mg  one tablet two times per day  B. Ranitidine 150mg  one tablet two times per day  3. "Action Plan":   A. use EpiPen  B. take Benadryl 50 mg  C. take prednisone 20 mg single dose clinic samples  4. Further treatment? - Return in 6 months or earlier if problem  Daniel Gibson is done quite well and we will now see him back in this clinic in 6 months. He will contact me during the interval should he have any significant problems that arise but otherwise he will continue to remain away from all mammal consumption and continue to use an H1 and H2 receptor blocker as noted  above.  Allena Katz, MD [place]

## 2015-10-04 NOTE — Patient Instructions (Signed)
  1. Epi-Pen if needed.  2. Preventative treatment:   A. Loratadine 10mg  one tablet two times per day  B. Ranitidine 150mg  one tablet two times per day  3. "Action Plan":   A. use EpiPen  B. take Benadryl 50 mg  C. take prednisone 20 mg single dose clinic samples  4. Further treatment? - Return in 6 months or earlier if problem

## 2016-04-06 ENCOUNTER — Encounter: Payer: Self-pay | Admitting: Allergy and Immunology

## 2016-04-06 ENCOUNTER — Ambulatory Visit (INDEPENDENT_AMBULATORY_CARE_PROVIDER_SITE_OTHER): Payer: BLUE CROSS/BLUE SHIELD | Admitting: Allergy and Immunology

## 2016-04-06 VITALS — BP 150/90 | HR 76 | Resp 20

## 2016-04-06 DIAGNOSIS — T783XXD Angioneurotic edema, subsequent encounter: Secondary | ICD-10-CM | POA: Diagnosis not present

## 2016-04-06 DIAGNOSIS — Z91018 Allergy to other foods: Secondary | ICD-10-CM

## 2016-04-06 DIAGNOSIS — L501 Idiopathic urticaria: Secondary | ICD-10-CM | POA: Diagnosis not present

## 2016-04-06 DIAGNOSIS — Z23 Encounter for immunization: Secondary | ICD-10-CM | POA: Diagnosis not present

## 2016-04-06 MED ORDER — EPINEPHRINE 0.3 MG/0.3ML IJ SOAJ: INTRAMUSCULAR | 3 refills | Status: DC

## 2016-04-06 NOTE — Patient Instructions (Signed)
  1. Epi-Pen if needed.  2. Preventative treatment:   A. Loratadine 10mg  one tablet two times per day  B. Ranitidine 150mg  one tablet two times per day  3. "Action Plan":   A. use EpiPen  B. take Benadryl 50 mg  C. take prednisone 20 mg single dose clinic samples  4. Further treatment? - Return in 12 months or earlier if problem  5. Flu vaccine delivered in clinic today

## 2016-04-06 NOTE — Progress Notes (Signed)
Follow-up Note  Referring Provider: Leonides Sake, MD Primary Provider: Leonides Sake, MD Date of Office Visit: 04/06/2016  Subjective:   Daniel Gibson (DOB: 1951/01/11) is a 65 y.o. male who returns to the [facility] [place] on 04/06/2016 in re-evaluation of the following:  HPI: Daniel Gibson presents this clinic in reevaluation of his alpha gal syndrome and recurrent allergic reactions. I last saw him in his clinic in April 2017.  He has had 2 very minor reactions involving unilateral swelling of his tongue one occurring after eating pizza at a restaurant and the other with unknown trigger. He takes Benadryl and 20 mg of prednisone and his reaction resolves within a few hours without any other associated systemic or constitutional symptoms. He remains away from all mammal consumption at this point in time and while doing so has noticed very good control of his reactions. He does use loratadine and ranitidine consistently.    Medication List      amLODipine 10 MG tablet Commonly known as:  NORVASC Take 10 mg by mouth daily.   aspirin 81 MG tablet Take 81 mg by mouth daily.   atorvastatin 40 MG tablet Commonly known as:  LIPITOR Take 40 mg by mouth daily. Patient uses this medication at bedtime.   carvedilol 25 MG tablet Commonly known as:  COREG Take 12.5 mg by mouth 2 (two) times daily with a meal.   EPINEPHrine 0.3 mg/0.3 mL Soaj injection Commonly known as:  EPI-PEN Use as directed for life-threatening allergic reaction.   eszopiclone 2 MG Tabs tablet Commonly known as:  LUNESTA TAKE 1 TABLET BY MOUTH AT BEDTIME FOR SLEEP   glimepiride 2 MG tablet Commonly known as:  AMARYL Take 2 mg by mouth daily.   loratadine 10 MG tablet Commonly known as:  CLARITIN Take 10 mg by mouth 2 (two) times daily.   metFORMIN 1000 MG tablet Commonly known as:  GLUCOPHAGE Take 1,000 mg by mouth 2 (two) times daily with a meal.   nitroGLYCERIN 0.4 MG SL  tablet Commonly known as:  NITROSTAT Place 0.4 mg under the tongue every 5 (five) minutes as needed.   ranitidine 150 MG tablet Commonly known as:  ZANTAC Take 150 mg by mouth 2 (two) times daily.   tamsulosin 0.4 MG Caps capsule Commonly known as:  FLOMAX Take 0.8 mg by mouth daily.   trospium 20 MG tablet Commonly known as:  SANCTURA Take 20 mg by mouth 2 (two) times daily.       Past Medical History:  Diagnosis Date  . CAD (coronary artery disease)   . Cancer [facility])    prostate  . Carotid artery occlusion   . Diabetes mellitus without complication ([place])   . Food allergy    Alpha Gal allergy   . Hyperlipidemia   . Hypertension   . Myocardial infarction 1993   medically managed  . Thrombocytopenia ([place])     Past Surgical History:  Procedure Laterality Date  . APPENDECTOMY    . CAROTID ENDARTERECTOMY Right 06-07-12   cea  . ENDARTERECTOMY  06/07/2012   Procedure: ENDARTERECTOMY CAROTID;  Surgeon: Angelia Mould, MD;  Location: [facility] OR;  Service: Vascular;  Laterality: Right;  Right Carotid Endarterectomy with Patch Angioplasty  . TRANSPERINEAL IMPLANT OF RADIATION SEEDS W/ ULTRASOUND      Allergies  Allergen Reactions  . Ace Inhibitors Swelling  . Beef-Derived Products Anaphylaxis    Alpha-Gal allergy  . Pork-Derived Products Anaphylaxis    Alpha-Gal  allergy  . Sheep-Derived Products Anaphylaxis    Alpha-Gal allergy  . Tetanus Toxoids Swelling    Review of systems negative except as noted in HPI / PMHx or noted below:  Review of Systems  Constitutional: Negative.   HENT: Negative.   Eyes: Negative.   Respiratory: Negative.   Cardiovascular: Negative.   Gastrointestinal: Negative.   Genitourinary: Negative.   Musculoskeletal: Negative.   Skin: Negative.   Neurological: Negative.   Endo/Heme/Allergies: Negative.   Psychiatric/Behavioral: Negative.      Objective:   Vitals:   04/06/16 0841  BP: (!) 150/90  Pulse: 76  Resp: 20           Physical Exam  Constitutional: He is well-developed, well-nourished, and in no distress.  HENT:  Head: Normocephalic.  Right Ear: Tympanic membrane, external ear and ear canal normal.  Left Ear: Tympanic membrane, external ear and ear canal normal.  Nose: Nose normal. No mucosal edema or rhinorrhea.  Mouth/Throat: Uvula is midline, oropharynx is clear and moist and mucous membranes are normal. No oropharyngeal exudate.  Eyes: Conjunctivae are normal.  Neck: Trachea normal. No tracheal tenderness present. No tracheal deviation present. No thyromegaly present.  Cardiovascular: Normal rate, regular rhythm, S1 normal, S2 normal and normal heart sounds.   No murmur heard. Pulmonary/Chest: Breath sounds normal. No stridor. No respiratory distress. He has no wheezes. He has no rales.  Musculoskeletal: He exhibits no edema.  Lymphadenopathy:       Head (right side): No tonsillar adenopathy present.       Head (left side): No tonsillar adenopathy present.    He has no cervical adenopathy.  Neurological: He is alert. Gait normal.  Skin: No rash noted. He is not diaphoretic. No erythema. Nails show no clubbing.  Psychiatric: Mood and affect normal.    Diagnostics: None   Assessment and Plan:   1. Allergy to meat   2. Urticaria, idiopathic   3. Angioedema, subsequent encounter     1. Epi-Pen if needed.  2. Preventative treatment:   A. Loratadine 10mg  one tablet two times per day  B. Ranitidine 150mg  one tablet two times per day  3. "Action Plan":   A. use EpiPen  B. take Benadryl 50 mg  C. take prednisone 20 mg single dose clinic samples  4. Further treatment? - Return in 12 months or earlier if problem  5. Flu vaccine delivered in clinic today  Daniel Gibson appears to be doing relatively well on his current medical therapy which includes avoidance measures directed against mammal and an H1 and H2 receptor blocker and an action plan to initiate should he develop a allergic  reaction in the future. At this point I'm not going to have him undergo any further evaluation for his issues and continue to have him use the plan mentioned above and I will see him back in this clinic in 1 year or earlier if there is a problem.  Allena Katz, MD [place]

## 2016-08-07 DIAGNOSIS — C61 Malignant neoplasm of prostate: Secondary | ICD-10-CM

## 2016-08-07 HISTORY — DX: Malignant neoplasm of prostate: C61

## 2016-09-28 DIAGNOSIS — D696 Thrombocytopenia, unspecified: Secondary | ICD-10-CM | POA: Diagnosis not present

## 2016-09-28 DIAGNOSIS — E782 Mixed hyperlipidemia: Secondary | ICD-10-CM | POA: Diagnosis not present

## 2016-09-28 DIAGNOSIS — E1129 Type 2 diabetes mellitus with other diabetic kidney complication: Secondary | ICD-10-CM | POA: Diagnosis not present

## 2016-09-28 DIAGNOSIS — I10 Essential (primary) hypertension: Secondary | ICD-10-CM | POA: Diagnosis not present

## 2016-10-03 DIAGNOSIS — Z6834 Body mass index (BMI) 34.0-34.9, adult: Secondary | ICD-10-CM | POA: Diagnosis not present

## 2016-10-03 DIAGNOSIS — I2510 Atherosclerotic heart disease of native coronary artery without angina pectoris: Secondary | ICD-10-CM | POA: Diagnosis not present

## 2016-10-03 DIAGNOSIS — Z9181 History of falling: Secondary | ICD-10-CM | POA: Diagnosis not present

## 2016-10-03 DIAGNOSIS — I10 Essential (primary) hypertension: Secondary | ICD-10-CM | POA: Diagnosis not present

## 2016-10-03 DIAGNOSIS — E782 Mixed hyperlipidemia: Secondary | ICD-10-CM | POA: Diagnosis not present

## 2016-10-03 DIAGNOSIS — C61 Malignant neoplasm of prostate: Secondary | ICD-10-CM | POA: Diagnosis not present

## 2016-10-03 DIAGNOSIS — E1129 Type 2 diabetes mellitus with other diabetic kidney complication: Secondary | ICD-10-CM | POA: Diagnosis not present

## 2016-10-18 DIAGNOSIS — R69 Illness, unspecified: Secondary | ICD-10-CM | POA: Diagnosis not present

## 2017-01-16 DIAGNOSIS — D235 Other benign neoplasm of skin of trunk: Secondary | ICD-10-CM | POA: Diagnosis not present

## 2017-01-16 DIAGNOSIS — L821 Other seborrheic keratosis: Secondary | ICD-10-CM | POA: Diagnosis not present

## 2017-01-20 DIAGNOSIS — R69 Illness, unspecified: Secondary | ICD-10-CM | POA: Diagnosis not present

## 2017-02-02 DIAGNOSIS — Z79899 Other long term (current) drug therapy: Secondary | ICD-10-CM | POA: Diagnosis not present

## 2017-02-02 DIAGNOSIS — E1129 Type 2 diabetes mellitus with other diabetic kidney complication: Secondary | ICD-10-CM | POA: Diagnosis not present

## 2017-02-02 DIAGNOSIS — I2510 Atherosclerotic heart disease of native coronary artery without angina pectoris: Secondary | ICD-10-CM | POA: Diagnosis not present

## 2017-02-06 DIAGNOSIS — I6529 Occlusion and stenosis of unspecified carotid artery: Secondary | ICD-10-CM | POA: Diagnosis not present

## 2017-02-06 DIAGNOSIS — I2510 Atherosclerotic heart disease of native coronary artery without angina pectoris: Secondary | ICD-10-CM | POA: Diagnosis not present

## 2017-02-06 DIAGNOSIS — E1129 Type 2 diabetes mellitus with other diabetic kidney complication: Secondary | ICD-10-CM | POA: Diagnosis not present

## 2017-02-06 DIAGNOSIS — Z6834 Body mass index (BMI) 34.0-34.9, adult: Secondary | ICD-10-CM | POA: Diagnosis not present

## 2017-02-06 DIAGNOSIS — Z23 Encounter for immunization: Secondary | ICD-10-CM | POA: Diagnosis not present

## 2017-02-06 DIAGNOSIS — I10 Essential (primary) hypertension: Secondary | ICD-10-CM | POA: Diagnosis not present

## 2017-02-06 DIAGNOSIS — E782 Mixed hyperlipidemia: Secondary | ICD-10-CM | POA: Diagnosis not present

## 2017-02-14 ENCOUNTER — Other Ambulatory Visit: Payer: Self-pay

## 2017-02-14 DIAGNOSIS — I6529 Occlusion and stenosis of unspecified carotid artery: Secondary | ICD-10-CM

## 2017-03-19 ENCOUNTER — Encounter: Payer: Self-pay | Admitting: Cardiology

## 2017-03-19 DIAGNOSIS — Z8546 Personal history of malignant neoplasm of prostate: Secondary | ICD-10-CM | POA: Diagnosis not present

## 2017-03-19 DIAGNOSIS — E785 Hyperlipidemia, unspecified: Secondary | ICD-10-CM | POA: Diagnosis not present

## 2017-03-19 DIAGNOSIS — I119 Hypertensive heart disease without heart failure: Secondary | ICD-10-CM | POA: Diagnosis not present

## 2017-03-19 DIAGNOSIS — I2510 Atherosclerotic heart disease of native coronary artery without angina pectoris: Secondary | ICD-10-CM | POA: Diagnosis not present

## 2017-03-19 DIAGNOSIS — E668 Other obesity: Secondary | ICD-10-CM | POA: Diagnosis not present

## 2017-03-19 DIAGNOSIS — E119 Type 2 diabetes mellitus without complications: Secondary | ICD-10-CM | POA: Diagnosis not present

## 2017-03-19 DIAGNOSIS — I6523 Occlusion and stenosis of bilateral carotid arteries: Secondary | ICD-10-CM | POA: Diagnosis not present

## 2017-03-19 DIAGNOSIS — I252 Old myocardial infarction: Secondary | ICD-10-CM | POA: Diagnosis not present

## 2017-04-04 ENCOUNTER — Ambulatory Visit (INDEPENDENT_AMBULATORY_CARE_PROVIDER_SITE_OTHER): Payer: Medicare HMO | Admitting: Vascular Surgery

## 2017-04-04 ENCOUNTER — Ambulatory Visit (HOSPITAL_COMMUNITY)
Admission: RE | Admit: 2017-04-04 | Discharge: 2017-04-04 | Disposition: A | Discharge status: Home / Self Care | Payer: Medicare HMO | Source: Ambulatory Visit | Attending: Vascular Surgery | Admitting: Vascular Surgery

## 2017-04-04 ENCOUNTER — Encounter: Payer: Self-pay | Admitting: Vascular Surgery

## 2017-04-04 VITALS — BP 154/92 | HR 74 | Temp 97.3°F | Resp 18 | Ht 70.0 in | Wt 224.0 lb

## 2017-04-04 DIAGNOSIS — I6523 Occlusion and stenosis of bilateral carotid arteries: Secondary | ICD-10-CM

## 2017-04-04 DIAGNOSIS — I6529 Occlusion and stenosis of unspecified carotid artery: Secondary | ICD-10-CM | POA: Diagnosis not present

## 2017-04-04 LAB — VAS US CAROTID
LEFT ECA DIAS: -34 cm/s
Left CCA dist dias: 17 cm/s
Left CCA dist sys: 67 cm/s
Left CCA prox dias: 17 cm/s
Left CCA prox sys: 65 cm/s
Left ICA dist dias: -30 cm/s
Left ICA dist sys: -86 cm/s
Left ICA prox dias: -19 cm/s
Left ICA prox sys: -69 cm/s
RCCADSYS: -97 cm/s
RCCAPDIAS: -27 cm/s
RIGHT CCA MID DIAS: 20 cm/s
RIGHT ECA DIAS: -23 cm/s
Right CCA prox sys: -105 cm/s

## 2017-04-04 NOTE — Progress Notes (Signed)
Patient name: Daniel Gibson MRN: [id number] DOB: 01-16-1951 Sex: male  REASON FOR VISIT:    Follow up of carotid disease  HPI:   Daniel Gibson is a pleasant 66 y.o. male who I last saw on 01/29/2013. He underwent a right carotid endarterectomy in December 2013. He has been followed by our nurse practitioner but requested that I continue his follow up. Since I saw him last, he denies any history of stroke, TIAs, expressive or receptive aphasia, or amaurosis fugax. He is on aspirin and is on a statin.  I have reviewed the records that were sent from [facility]. He was seen on 02/06/2017. At that time his diabetes was under good control as was his hypertension. He also had hyperlipidemia which is also under good control.  Past Medical History:  Diagnosis Date  . CAD (coronary artery disease)   . Cancer [facility])    prostate  . Carotid artery occlusion   . Diabetes mellitus without complication ([place])   . Food allergy    Alpha Gal allergy   . Hyperlipidemia   . Hypertension   . Myocardial infarction ([place]) 1993   medically managed  . Thrombocytopenia ([place])     Family History  Problem Relation Age of Onset  . Cancer Mother   . Hyperlipidemia Mother   . Hypertension Mother   . Heart attack Father   . Heart disease Father        Heart Disease before age 62  . Hyperlipidemia Father   . Hypertension Father   . Diabetes Sister   . Heart disease Sister        before age 81  . Hypertension Sister   . Hyperlipidemia Sister   . Diabetes Brother   . Heart disease Brother        before age 34  . Hyperlipidemia Brother   . Hypertension Brother     SOCIAL HISTORY: Social History  Substance Use Topics  . Smoking status: Never Smoker  . Smokeless tobacco: Never Used  . Alcohol use No    Allergies  Allergen Reactions  . Ace Inhibitors Swelling  . Beef-Derived Products Anaphylaxis    Alpha-Gal allergy  . Pork-Derived Products Anaphylaxis   Alpha-Gal allergy  . Sheep-Derived Products Anaphylaxis    Alpha-Gal allergy  . Tetanus Toxoids Swelling  . Beef Extract     Current Outpatient Prescriptions  Medication Sig Dispense Refill  . amLODipine (NORVASC) 10 MG tablet Take 10 mg by mouth daily.    [name] aspirin 81 MG tablet Take 81 mg by mouth daily.    [name] atorvastatin (LIPITOR) 40 MG tablet Take 40 mg by mouth daily. Patient uses this medication at bedtime.    . carvedilol (COREG) 25 MG tablet Take 12.5 mg by mouth 2 (two) times daily with a meal.    . EPINEPHrine (EPIPEN 2-PAK) 0.3 mg/0.3 mL IJ SOAJ injection Inject 0.3 mLs (0.3 mg total) into the muscle once. 4 Device 3  . EPINEPHrine 0.3 mg/0.3 mL IJ SOAJ injection Use as directed for life-threatening allergic reaction. 2 Device 3  . eszopiclone (LUNESTA) 2 MG TABS tablet TAKE 1 TABLET BY MOUTH AT BEDTIME FOR SLEEP  1  . glimepiride (AMARYL) 2 MG tablet Take 2 mg by mouth daily.    [name] loratadine (CLARITIN) 10 MG tablet Take 10 mg by mouth 2 (two) times daily.    . metFORMIN (GLUCOPHAGE) 1000 MG tablet Take 1,000 mg by mouth 2 (two) times daily with a  meal.    . nitroGLYCERIN (NITROSTAT) 0.4 MG SL tablet Place 0.4 mg under the tongue every 5 (five) minutes as needed.    . ranitidine (ZANTAC) 150 MG tablet Take 150 mg by mouth 2 (two) times daily.    . Tamsulosin HCl (FLOMAX) 0.4 MG CAPS Take 0.8 mg by mouth daily.     . trospium (SANCTURA) 20 MG tablet Take 20 mg by mouth 2 (two) times daily.      No current facility-administered medications for this visit.     REVIEW OF SYSTEMS:  [X]  denotes positive finding, [ ]  denotes negative finding Cardiac  Comments:  Chest pain or chest pressure:    Shortness of breath upon exertion:    Short of breath when lying flat:    Irregular heart rhythm:        Vascular    Pain in calf, thigh, or hip brought on by ambulation:    Pain in feet at night that wakes you up from your sleep:     Blood clot in your veins:    Leg swelling:           Pulmonary    Oxygen at home:    Productive cough:     Wheezing:         Neurologic    Sudden weakness in arms or legs:     Sudden numbness in arms or legs:     Sudden onset of difficulty speaking or slurred speech:    Temporary loss of vision in one eye:     Problems with dizziness:         Gastrointestinal    Blood in stool:     Vomited blood:         Genitourinary    Burning when urinating:     Blood in urine:        Psychiatric    Major depression:         Hematologic    Bleeding problems:    Problems with blood clotting too easily:        Skin    Rashes or ulcers:        Constitutional    Fever or chills:     PHYSICAL EXAM:   Vitals:   04/04/17 0843 04/04/17 0844  BP: (!) 153/86 (!) 150/85  Pulse: 73 74  Resp: 18   Temp: (!) 97.3 F (36.3 C)   SpO2: 96%   Weight: 224 lb (101.6 kg)   Height: 5\' 10"  (1.778 m)     GENERAL: The patient is a well-nourished male, in no acute distress. The vital signs are documented above. CARDIAC: There is a regular rate and rhythm.  VASCULAR: I do not detect carotid bruits. He has palpable dorsalis pedis and posterior tibial pulses bilaterally. He has no significant lower extremity swelling. PULMONARY: There is good air exchange bilaterally without wheezing or rales. ABDOMEN: Soft and non-tender with normal pitched bowel sounds. I do not palpate an abdominal aortic aneurysm although his abdomen is difficult to assess because of his size. MUSCULOSKELETAL: There are no major deformities or cyanosis. NEUROLOGIC: No focal weakness or paresthesias are detected. SKIN: There are no ulcers or rashes noted. PSYCHIATRIC: The patient has a normal affect.  DATA:    CAROTID DUPLEX: I have independently interpreted his carotid duplex scan today.  On the right side where his had previous carotid endarterectomy he has a 40-59% recurrent stenosis at the distal patch.  On the left side he has  a 40-59% carotid stenosis.  Both  vertebral arteries are 8 and with antegrade flow.  MEDICAL ISSUES:   BILATERAL CAROTID STENOSES: He has asymptomatic bilateral 40-59% carotid stenoses. He is on aspirin and is on a statin. He is not a smoker. I've encouraged him to stay as active as possible. I ordered a follow up carotid duplex scan in 1 year to see him back at that time. He knows to call sooner if he has problems. I've explained that we would not consider carotid endarterectomy in an asymptomatic patient and less the stenosis progressed to greater than 80%.  HYPERTENSION: The patient's initial blood pressure today was elevated. We repeated this and this was still elevated. We have encouraged the patient to follow up with their primary care physician for management of their blood pressure.   Deitra Mayo [facility] [place] [phone]

## 2017-04-04 NOTE — Progress Notes (Signed)
Vitals:   04/04/17 0843 04/04/17 0844  BP: (!) 153/86 (!) 150/85  Pulse: 73 74  Resp: 18   Temp: (!) 97.3 F (36.3 C)   SpO2: 96%   Weight: 224 lb (101.6 kg)   Height: 5\' 10"  (1.778 m)

## 2017-04-09 ENCOUNTER — Ambulatory Visit (INDEPENDENT_AMBULATORY_CARE_PROVIDER_SITE_OTHER): Payer: Medicare HMO | Admitting: Allergy and Immunology

## 2017-04-09 ENCOUNTER — Encounter: Payer: Self-pay | Admitting: Allergy and Immunology

## 2017-04-09 VITALS — BP 142/70 | HR 72 | Resp 18

## 2017-04-09 DIAGNOSIS — L501 Idiopathic urticaria: Secondary | ICD-10-CM

## 2017-04-09 DIAGNOSIS — Z91018 Allergy to other foods: Secondary | ICD-10-CM | POA: Diagnosis not present

## 2017-04-09 DIAGNOSIS — T783XXD Angioneurotic edema, subsequent encounter: Secondary | ICD-10-CM

## 2017-04-09 NOTE — Progress Notes (Signed)
Follow-up Note  Referring Provider: Leonides Sake, MD Primary Provider: Cyndi Bender, Hershal Coria Date of Office Visit: 04/09/2017  Subjective:   Daniel Gibson (DOB: [birth year]) is a 66 y.o. male who returns to the [facility] on 04/09/2017 in re-evaluation of the following:  HPI: Jr returns to this clinic in reevaluation of his alpha gal syndrome and history of recurrent allergic reactions with angioedema and urticaria. His last visit to this clinic was 04/06/2016.  He has not had any reactions during the interval. He has not had angioedema or urticaria. He remains away from all mammal consumption. He has consolidated his H1 and H2 receptor blocker to 1 time per day.  Allergies as of 04/09/2017      Reactions   Ace Inhibitors Swelling   Beef-derived Products Anaphylaxis   Alpha-Gal allergy   Pork-derived Products Anaphylaxis   Alpha-Gal allergy   Sheep-derived Products Anaphylaxis   Alpha-Gal allergy   Tetanus Toxoids Swelling   Beef Extract       Medication List      amLODipine 10 MG tablet Commonly known as:  NORVASC Take 10 mg by mouth daily.   aspirin 81 MG tablet Take 81 mg by mouth daily.   atorvastatin 40 MG tablet Commonly known as:  LIPITOR Take 40 mg by mouth daily. Patient uses this medication at bedtime.   carvedilol 25 MG tablet Commonly known as:  COREG Take 12.5 mg by mouth 2 (two) times daily with a meal.   EPINEPHrine 0.3 mg/0.3 mL Soaj injection Commonly known as:  EPI-PEN Use as directed for life-threatening allergic reaction.   eszopiclone 2 MG Tabs tablet Commonly known as:  LUNESTA TAKE 1 TABLET BY MOUTH AT BEDTIME FOR SLEEP   glimepiride 2 MG tablet Commonly known as:  AMARYL Take 2 mg by mouth daily.   loratadine 10 MG tablet Commonly known as:  CLARITIN Take 10 mg by mouth 2 (two) times daily.   metFORMIN 1000 MG tablet Commonly known as:  GLUCOPHAGE Take 1,000 mg by mouth 2 (two) times daily with a  meal.   nitroGLYCERIN 0.4 MG SL tablet Commonly known as:  NITROSTAT Place 0.4 mg under the tongue every 5 (five) minutes as needed.   ranitidine 150 MG tablet Commonly known as:  ZANTAC Take 150 mg by mouth 2 (two) times daily.   tamsulosin 0.4 MG Caps capsule Commonly known as:  FLOMAX Take 0.8 mg by mouth daily.   trospium 20 MG tablet Commonly known as:  SANCTURA Take 20 mg by mouth 2 (two) times daily.       Past Medical History:  Diagnosis Date  . CAD (coronary artery disease)   . Cancer [facility])    prostate  . Carotid artery occlusion   . Diabetes mellitus without complication ([place])   . Food allergy    Alpha Gal allergy   . Hyperlipidemia   . Hypertension   . Myocardial infarction ([place]) 1993   medically managed  . Thrombocytopenia ([place])     Past Surgical History:  Procedure Laterality Date  . APPENDECTOMY    . CAROTID ENDARTERECTOMY Right 06-07-12   cea  . ENDARTERECTOMY  06/07/2012   Procedure: ENDARTERECTOMY CAROTID;  Surgeon: Angelia Mould, MD;  Location: [facility] OR;  Service: Vascular;  Laterality: Right;  Right Carotid Endarterectomy with Patch Angioplasty  . TRANSPERINEAL IMPLANT OF RADIATION SEEDS W/ ULTRASOUND      Review of systems negative except as noted in HPI / PMHx or  noted below:  Review of Systems  Constitutional: Negative.   HENT: Negative.   Eyes: Negative.   Respiratory: Negative.   Cardiovascular: Negative.   Gastrointestinal: Negative.   Genitourinary:       Had urinary tract infection with bacteremia February 2018  Musculoskeletal: Negative.   Skin: Negative.   Neurological: Negative.   Endo/Heme/Allergies: Negative.   Psychiatric/Behavioral: Negative.      Objective:   Vitals:   04/09/17 0834  BP: (!) 142/70  Pulse: 72  Resp: 18          Physical Exam  Constitutional: He is well-developed, well-nourished, and in no distress.  HENT:  Head: Normocephalic.  Right Ear: Tympanic membrane, external ear and ear  canal normal.  Left Ear: Tympanic membrane, external ear and ear canal normal.  Nose: Nose normal. No mucosal edema or rhinorrhea.  Mouth/Throat: Uvula is midline, oropharynx is clear and moist and mucous membranes are normal. No oropharyngeal exudate.  Eyes: Conjunctivae are normal.  Neck: Trachea normal. No tracheal tenderness present. No tracheal deviation present. No thyromegaly present.  Cardiovascular: Normal rate, regular rhythm, S1 normal, S2 normal and normal heart sounds.   No murmur heard. Pulmonary/Chest: Breath sounds normal. No stridor. No respiratory distress. He has no wheezes. He has no rales.  Musculoskeletal: He exhibits no edema.  Lymphadenopathy:       Head (right side): No tonsillar adenopathy present.       Head (left side): No tonsillar adenopathy present.    He has no cervical adenopathy.  Neurological: He is alert. Gait normal.  Skin: No rash noted. He is not diaphoretic. No erythema. Nails show no clubbing.  Psychiatric: Mood and affect normal.    Diagnostics: none  Assessment and Plan:   1. Allergy to meat   2. Angioedema, subsequent encounter   3. Urticaria, idiopathic     1. Epi-Pen if needed.  2. Preventative treatment:   A. Loratadine 10mg  one tablet one time per day  B. Ranitidine 150mg  one tablet one time per day  3. "Action Plan":   A. use EpiPen  B. take Benadryl 50 mg  C. take prednisone 20 mg single dose clinic samples  4. Further treatment? - Return in 12 months or earlier if problem   Daniel Gibson appears to be doing quite well with avoidance measures regarding mammal consumption and the use of an H1 and H2 receptor blocker. He will continue on this plan and I will see him back in this clinic in 1 year or earlier if there is a problem.  Daniel Katz, MD Allergy / Immunology [place]

## 2017-04-09 NOTE — Patient Instructions (Addendum)
  1. Epi-Pen if needed.  2. Preventative treatment:   A. Loratadine 10mg  one tablet one time per day  B. Ranitidine 150mg  one tablet one time per day  3. "Action Plan":   A. use EpiPen  B. take Benadryl 50 mg  C. take prednisone 20 mg single dose clinic samples  4. Further treatment? - Return in 12 months or earlier if problem

## 2017-04-19 ENCOUNTER — Other Ambulatory Visit: Payer: Self-pay | Admitting: Allergy and Immunology

## 2017-05-23 NOTE — Addendum Note (Signed)
Addended by: Lianne Cure A on: 05/23/2017 01:10 PM   Modules accepted: Orders

## 2017-06-06 ENCOUNTER — Telehealth: Payer: Self-pay | Admitting: *Deleted

## 2017-06-06 NOTE — Telephone Encounter (Signed)
Yes, please provide lab order for alpha-gal panel

## 2017-06-06 NOTE — Telephone Encounter (Signed)
Tavaras would like to know if we can provide a lab order form to re-check his Alpha-Gal panel. He is supposed to have labs drawn next week at his PCP office and would like [place] to send the order form there. Is that okay? His levels have not been checked since November 2016.  PCP: [facility] in [place], [place]  Fax: [phone]

## 2017-06-06 NOTE — Telephone Encounter (Signed)
Patient informed and order sent to [facility].

## 2017-06-08 DIAGNOSIS — R69 Illness, unspecified: Secondary | ICD-10-CM | POA: Diagnosis not present

## 2017-06-12 DIAGNOSIS — D696 Thrombocytopenia, unspecified: Secondary | ICD-10-CM | POA: Diagnosis not present

## 2017-06-12 DIAGNOSIS — E782 Mixed hyperlipidemia: Secondary | ICD-10-CM | POA: Diagnosis not present

## 2017-06-12 DIAGNOSIS — T783XXA Angioneurotic edema, initial encounter: Secondary | ICD-10-CM | POA: Diagnosis not present

## 2017-06-12 DIAGNOSIS — I10 Essential (primary) hypertension: Secondary | ICD-10-CM | POA: Diagnosis not present

## 2017-06-12 DIAGNOSIS — E1129 Type 2 diabetes mellitus with other diabetic kidney complication: Secondary | ICD-10-CM | POA: Diagnosis not present

## 2017-06-13 DIAGNOSIS — T783XXA Angioneurotic edema, initial encounter: Secondary | ICD-10-CM | POA: Diagnosis not present

## 2017-06-13 DIAGNOSIS — I2510 Atherosclerotic heart disease of native coronary artery without angina pectoris: Secondary | ICD-10-CM | POA: Diagnosis not present

## 2017-06-13 DIAGNOSIS — Z6834 Body mass index (BMI) 34.0-34.9, adult: Secondary | ICD-10-CM | POA: Diagnosis not present

## 2017-06-13 DIAGNOSIS — E1129 Type 2 diabetes mellitus with other diabetic kidney complication: Secondary | ICD-10-CM | POA: Diagnosis not present

## 2017-06-13 DIAGNOSIS — G4700 Insomnia, unspecified: Secondary | ICD-10-CM | POA: Diagnosis not present

## 2017-06-13 DIAGNOSIS — I10 Essential (primary) hypertension: Secondary | ICD-10-CM | POA: Diagnosis not present

## 2017-06-13 DIAGNOSIS — Z1331 Encounter for screening for depression: Secondary | ICD-10-CM | POA: Diagnosis not present

## 2017-06-13 DIAGNOSIS — Z23 Encounter for immunization: Secondary | ICD-10-CM | POA: Diagnosis not present

## 2017-06-13 DIAGNOSIS — R0989 Other specified symptoms and signs involving the circulatory and respiratory systems: Secondary | ICD-10-CM | POA: Diagnosis not present

## 2017-07-13 DIAGNOSIS — R69 Illness, unspecified: Secondary | ICD-10-CM | POA: Diagnosis not present

## 2017-07-26 DIAGNOSIS — R3915 Urgency of urination: Secondary | ICD-10-CM | POA: Diagnosis not present

## 2017-07-26 DIAGNOSIS — N5201 Erectile dysfunction due to arterial insufficiency: Secondary | ICD-10-CM | POA: Diagnosis not present

## 2017-07-26 DIAGNOSIS — C61 Malignant neoplasm of prostate: Secondary | ICD-10-CM | POA: Diagnosis not present

## 2017-07-26 DIAGNOSIS — R351 Nocturia: Secondary | ICD-10-CM | POA: Diagnosis not present

## 2017-08-03 DIAGNOSIS — R69 Illness, unspecified: Secondary | ICD-10-CM | POA: Diagnosis not present

## 2017-09-19 DIAGNOSIS — R69 Illness, unspecified: Secondary | ICD-10-CM | POA: Diagnosis not present

## 2017-10-15 DIAGNOSIS — I10 Essential (primary) hypertension: Secondary | ICD-10-CM | POA: Diagnosis not present

## 2017-10-15 DIAGNOSIS — E1129 Type 2 diabetes mellitus with other diabetic kidney complication: Secondary | ICD-10-CM | POA: Diagnosis not present

## 2017-10-15 DIAGNOSIS — I2510 Atherosclerotic heart disease of native coronary artery without angina pectoris: Secondary | ICD-10-CM | POA: Diagnosis not present

## 2017-10-18 DIAGNOSIS — I10 Essential (primary) hypertension: Secondary | ICD-10-CM | POA: Diagnosis not present

## 2017-10-18 DIAGNOSIS — N529 Male erectile dysfunction, unspecified: Secondary | ICD-10-CM | POA: Diagnosis not present

## 2017-10-18 DIAGNOSIS — Z6833 Body mass index (BMI) 33.0-33.9, adult: Secondary | ICD-10-CM | POA: Diagnosis not present

## 2017-10-18 DIAGNOSIS — E1129 Type 2 diabetes mellitus with other diabetic kidney complication: Secondary | ICD-10-CM | POA: Diagnosis not present

## 2017-10-18 DIAGNOSIS — Z1211 Encounter for screening for malignant neoplasm of colon: Secondary | ICD-10-CM | POA: Diagnosis not present

## 2017-10-18 DIAGNOSIS — I2510 Atherosclerotic heart disease of native coronary artery without angina pectoris: Secondary | ICD-10-CM | POA: Diagnosis not present

## 2017-10-18 DIAGNOSIS — I6529 Occlusion and stenosis of unspecified carotid artery: Secondary | ICD-10-CM | POA: Diagnosis not present

## 2017-10-18 DIAGNOSIS — G4700 Insomnia, unspecified: Secondary | ICD-10-CM | POA: Diagnosis not present

## 2017-11-06 ENCOUNTER — Encounter: Payer: Self-pay | Admitting: Allergy

## 2017-11-06 ENCOUNTER — Ambulatory Visit: Payer: Medicare HMO | Admitting: Allergy

## 2017-11-06 VITALS — BP 130/70 | HR 80 | Resp 16

## 2017-11-06 DIAGNOSIS — Z91018 Allergy to other foods: Secondary | ICD-10-CM | POA: Diagnosis not present

## 2017-11-06 DIAGNOSIS — L501 Idiopathic urticaria: Secondary | ICD-10-CM

## 2017-11-06 DIAGNOSIS — T783XXD Angioneurotic edema, subsequent encounter: Secondary | ICD-10-CM

## 2017-11-06 MED ORDER — EPIPEN 2-PAK 0.3 MG/0.3ML IJ SOAJ: INTRAMUSCULAR | 3 refills | Status: DC

## 2017-11-06 NOTE — Patient Instructions (Addendum)
Swelling and Rash  1. Epi-Pen if needed  2. Preventative treatment:   A. Increase to Loratadine 10mg  one tablet 2 times per day  B. Increase to Ranitidine 150mg  one tablet 2 times per day             Once swelling/rash has improved decrease back to once a day dosing.              Recommend to start twice a day dosing again 1-2 weeks prior to next Shingrix injection.   3. "Action Plan":   A. use EpiPen  B. take Benadryl 50 mg   C. take prednisone 20 mg once a day if increase in antihistamine dosing (as above) does not improve swelling in next 1-2 days.   Clinic samples provided  4. Return in 12 months or earlier if problem

## 2017-11-06 NOTE — Progress Notes (Signed)
Follow-up Note  RE: Daniel Gibson MRN: [id number] DOB: 1950/10/26 Date of Office Visit: 11/06/2017   History of present illness: Daniel Gibson is a 67 y.o. male presenting today for sick visit for swelling and rash.  He was last seen in the office on 04/09/17 by Dr. Neldon Mc.  He had been doing well and avoiding red meats.  He got the shingrix vaccine (does not contain gelatin or porcine products unlike zostavax) on Thursday in his left arm.   On Friday he states he noted swelling of his left hand and feet.  He also noted itchy hives on his right hand and some itchiness of his upper back.  He denies any fevers and no joint aches/pain outside of usual arthritis pains.  He states he has taken benadryl and tried icing his hands which hasn't done much to improve the swelling.  He does take loratidine and ranitidine once a day.  He does have access to an epipen which he has not needed to use.  He denies any GI, respiratory or CV related symptoms after vaccination.    Review of systems: Review of Systems  Constitutional: Negative for chills, fever and malaise/fatigue.  HENT: Negative for congestion, ear discharge, ear pain, nosebleeds and sore throat.   Eyes: Negative for pain, discharge and redness.  Respiratory: Negative for cough, shortness of breath and wheezing.   Cardiovascular: Negative for chest pain.  Gastrointestinal: Negative for abdominal pain, constipation, diarrhea, nausea and vomiting.  Musculoskeletal: Negative for joint pain.  Skin: Positive for itching and rash.  Neurological: Negative for headaches.    All other systems negative unless noted above in HPI  Past medical/social/surgical/family history have been reviewed and are unchanged unless specifically indicated below.  No changes  Medication List: Allergies as of 11/06/2017      Reactions   Ace Inhibitors Swelling   Beef-derived Products Anaphylaxis   Alpha-Gal allergy   Pork-derived Products Anaphylaxis   Alpha-Gal allergy   Sheep-derived Products Anaphylaxis   Alpha-Gal allergy   Tetanus Toxoids Swelling   Beef Extract       Medication List        Accurate as of 11/06/17  3:35 PM. Always use your most recent med list.          amLODipine 10 MG tablet Commonly known as:  NORVASC Take 10 mg by mouth daily.   aspirin 81 MG tablet Take 81 mg by mouth daily.   atorvastatin 40 MG tablet Commonly known as:  LIPITOR Take 40 mg by mouth daily. Patient uses this medication at bedtime.   carvedilol 25 MG tablet Commonly known as:  COREG Take 12.5 mg by mouth 2 (two) times daily with a meal.   glimepiride 2 MG tablet Commonly known as:  AMARYL Take 2 mg by mouth daily.   hydroquinone 4 % cream APPLY TO AFFECTED AREA EVERY DAY   loratadine 10 MG tablet Commonly known as:  CLARITIN Take 10 mg by mouth 2 (two) times daily.   metFORMIN 1000 MG tablet Commonly known as:  GLUCOPHAGE Take 1,000 mg by mouth 2 (two) times daily with a meal.   nitroGLYCERIN 0.4 MG SL tablet Commonly known as:  NITROSTAT Place 0.4 mg under the tongue every 5 (five) minutes as needed.   ranitidine 150 MG tablet Commonly known as:  ZANTAC Take 150 mg by mouth 2 (two) times daily.   sildenafil 100 MG tablet Commonly known as:  VIAGRA   tamsulosin 0.4 MG Caps capsule Commonly  known as:  FLOMAX Take 0.8 mg by mouth daily.   traZODone 50 MG tablet Commonly known as:  DESYREL TAKE 1 TABLET AT BEDTIME AS NEEDED FOR INSOMNIA   tretinoin 0.05 % cream Commonly known as:  RETIN-A APPLY TO AFFECTED AREA EVERY DAY AT NIGHT   Trospium Chloride 60 MG Cp24 TAKE 1 CAPSULE BY MOUTH EVERYDAY       Known medication allergies: Allergies  Allergen Reactions  . Ace Inhibitors Swelling  . Beef-Derived Products Anaphylaxis    Alpha-Gal allergy  . Pork-Derived Products Anaphylaxis    Alpha-Gal allergy  . Sheep-Derived Products Anaphylaxis    Alpha-Gal allergy  . Tetanus Toxoids Swelling  . Beef  Extract      Physical examination: Blood pressure 130/70, pulse 80, resp. rate 16.  General: Alert, interactive, in no acute distress. HEENT: PERRLA, TMs pearly gray, turbinates non-edematous without discharge, post-pharynx non erythematous. Neck: Supple without lymphadenopathy. Lungs: Clear to auscultation without wheezing, rhonchi or rales. {no increased work of breathing. CV: Normal S1, S2 without murmurs. Abdomen: Nondistended, nontender. Skin: Scattered erythematous urticarial type lesions primarily located right hand near thumb.   left hand is edematous over ventral surface , nonvesicular. Extremities:  No clubbing, cyanosis or edema. Neuro:   Grossly intact.  Diagnositics/Labs: None today  Assessment and plan:   Angioedema and urticaria following vaccination - he did not have immediate symptoms following vaccination.  It is likely that his symptoms are due to internal stress from vaccine response.  To treat current symptoms will have him increase his antihistamines and provided with prednisone pack.  Advised that increase antihistamine in preparation for booster injection in 6 months as well as recommend the dose be administered in a office setting.   Alpha gal allergy - continue avoidance of red meat products   1. Epi-Pen if needed  2. Preventative treatment:   A. Increase to Loratadine 10mg  one tablet 2 times per day  B. Increase to Ranitidine 150mg  one tablet 2 times per day             Once swelling/rash has improved decrease back to once a day dosing.              Recommend to start twice a day dosing again 1-2 weeks prior to next Shingrix injection.   3. "Action Plan":   A. use EpiPen  B. take Benadryl 50 mg   C. take prednisone 20 mg once a day (provided with 5 day burst) if increase in antihistamine dosing (as above) does not improve swelling in next 1-2 days.   Clinic samples provided  4. Return in 12 months or earlier if problem   I appreciate the  opportunity to take part in Daniel Gibson care. Please do not hesitate to contact me with questions.  Sincerely,   Prudy Feeler, MD Allergy/Immunology [facility] [facility] [place]

## 2017-11-15 DIAGNOSIS — Z1212 Encounter for screening for malignant neoplasm of rectum: Secondary | ICD-10-CM | POA: Diagnosis not present

## 2017-11-15 DIAGNOSIS — Z1211 Encounter for screening for malignant neoplasm of colon: Secondary | ICD-10-CM | POA: Diagnosis not present

## 2018-02-01 DIAGNOSIS — R69 Illness, unspecified: Secondary | ICD-10-CM | POA: Diagnosis not present

## 2018-02-14 DIAGNOSIS — I10 Essential (primary) hypertension: Secondary | ICD-10-CM | POA: Diagnosis not present

## 2018-02-14 DIAGNOSIS — I2510 Atherosclerotic heart disease of native coronary artery without angina pectoris: Secondary | ICD-10-CM | POA: Diagnosis not present

## 2018-02-14 DIAGNOSIS — E1129 Type 2 diabetes mellitus with other diabetic kidney complication: Secondary | ICD-10-CM | POA: Diagnosis not present

## 2018-02-18 DIAGNOSIS — Z1339 Encounter for screening examination for other mental health and behavioral disorders: Secondary | ICD-10-CM | POA: Diagnosis not present

## 2018-02-18 DIAGNOSIS — I2510 Atherosclerotic heart disease of native coronary artery without angina pectoris: Secondary | ICD-10-CM | POA: Diagnosis not present

## 2018-02-18 DIAGNOSIS — Z139 Encounter for screening, unspecified: Secondary | ICD-10-CM | POA: Diagnosis not present

## 2018-02-18 DIAGNOSIS — E785 Hyperlipidemia, unspecified: Secondary | ICD-10-CM | POA: Diagnosis not present

## 2018-02-18 DIAGNOSIS — Z136 Encounter for screening for cardiovascular disorders: Secondary | ICD-10-CM | POA: Diagnosis not present

## 2018-02-18 DIAGNOSIS — Z0000 Encounter for general adult medical examination without abnormal findings: Secondary | ICD-10-CM | POA: Diagnosis not present

## 2018-02-18 DIAGNOSIS — E1129 Type 2 diabetes mellitus with other diabetic kidney complication: Secondary | ICD-10-CM | POA: Diagnosis not present

## 2018-02-18 DIAGNOSIS — Z125 Encounter for screening for malignant neoplasm of prostate: Secondary | ICD-10-CM | POA: Diagnosis not present

## 2018-02-18 DIAGNOSIS — E669 Obesity, unspecified: Secondary | ICD-10-CM | POA: Diagnosis not present

## 2018-02-18 DIAGNOSIS — Z6832 Body mass index (BMI) 32.0-32.9, adult: Secondary | ICD-10-CM | POA: Diagnosis not present

## 2018-02-19 DIAGNOSIS — R69 Illness, unspecified: Secondary | ICD-10-CM | POA: Diagnosis not present

## 2018-03-14 DIAGNOSIS — L814 Other melanin hyperpigmentation: Secondary | ICD-10-CM | POA: Diagnosis not present

## 2018-03-14 DIAGNOSIS — L821 Other seborrheic keratosis: Secondary | ICD-10-CM | POA: Diagnosis not present

## 2018-03-14 DIAGNOSIS — D2371 Other benign neoplasm of skin of right lower limb, including hip: Secondary | ICD-10-CM | POA: Diagnosis not present

## 2018-03-14 DIAGNOSIS — D225 Melanocytic nevi of trunk: Secondary | ICD-10-CM | POA: Diagnosis not present

## 2018-03-14 DIAGNOSIS — D1801 Hemangioma of skin and subcutaneous tissue: Secondary | ICD-10-CM | POA: Diagnosis not present

## 2018-03-14 DIAGNOSIS — D2262 Melanocytic nevi of left upper limb, including shoulder: Secondary | ICD-10-CM | POA: Diagnosis not present

## 2018-03-15 DIAGNOSIS — R69 Illness, unspecified: Secondary | ICD-10-CM | POA: Diagnosis not present

## 2018-04-08 ENCOUNTER — Encounter: Payer: Self-pay | Admitting: Cardiology

## 2018-04-08 ENCOUNTER — Ambulatory Visit: Payer: Medicare HMO | Admitting: Allergy and Immunology

## 2018-04-08 DIAGNOSIS — E119 Type 2 diabetes mellitus without complications: Secondary | ICD-10-CM | POA: Insufficient documentation

## 2018-04-08 DIAGNOSIS — E785 Hyperlipidemia, unspecified: Secondary | ICD-10-CM | POA: Insufficient documentation

## 2018-04-08 DIAGNOSIS — I252 Old myocardial infarction: Secondary | ICD-10-CM

## 2018-04-08 DIAGNOSIS — I2510 Atherosclerotic heart disease of native coronary artery without angina pectoris: Secondary | ICD-10-CM

## 2018-04-08 DIAGNOSIS — I119 Hypertensive heart disease without heart failure: Secondary | ICD-10-CM

## 2018-04-08 DIAGNOSIS — I493 Ventricular premature depolarization: Secondary | ICD-10-CM

## 2018-04-08 DIAGNOSIS — E669 Obesity, unspecified: Secondary | ICD-10-CM

## 2018-04-08 HISTORY — DX: Atherosclerotic heart disease of native coronary artery without angina pectoris: I25.10

## 2018-04-08 HISTORY — DX: Old myocardial infarction: I25.2

## 2018-04-08 HISTORY — DX: Type 2 diabetes mellitus without complications: E11.9

## 2018-04-08 HISTORY — DX: Hypertensive heart disease without heart failure: I11.9

## 2018-04-08 HISTORY — DX: Ventricular premature depolarization: I49.3

## 2018-04-08 HISTORY — DX: Obesity, unspecified: E66.9

## 2018-04-08 NOTE — Progress Notes (Signed)
Daniel Gibson  Date of visit:  03/19/2017 DOB:  September 18, 1950    Age:  67 yrs. Medical record number:  [id number]     Account number:  [id number] Primary Care Provider: Cyndi Bender ____________________________ CURRENT DIAGNOSES  1. CAD Native without angina  2. Hypertensive heart disease without heart failure  3. Hyperlipidemia  4. Obesity  5. Occlusion and stenosis of bilateral carotid arteries  6. Old myocardial infarction  7. PVC  8. Type 2 diabetes mellitus without complications  9. Personal history of malignant neoplasm of prostate ____________________________ ALLERGIES  Ace Inhibitors, Angioedema  Ramipril, Angioedema  Tetanus Toxoid, Intolerance-unknown ____________________________ MEDICATIONS  1. amlodipine 10 mg Tablet, 1 p.o. daily  2. aspirin 81 mg tablet,chewable, 1 p.o. daily  3. atorvastatin 40 mg tablet, 1 p.o. daily  4. carvedilol 25 mg Tablet, 1/2 tab b.i.d.  5. EpiPen 0.3 mg/0.3 mL injection, auto-injector, PRN  6. glimepiride 2 mg tablet, 1 p.o. daily  7. loratadine 10 mg tablet, BID  8. metformin 1,000 mg Tablet, BID  9. nitroglycerin 0.4 mg tablet, sublingual, PRN  10. ranitidine 150 mg tablet, BID  11. tamsulosin 0.4 mg capsule,extended release 24hr, 2  qhs  12. trospium 20 mg tablet, BID ____________________________ HISTORY OF PRESENT ILLNESS Patient returns for cardiac followup. He has had a good year since he was previously here. He denies angina but has not been able is any significant weight. He denies PND, orthopnea, syncope, or claudication. He was hospitalized with a infection earlier in the year and has been getting over this period blood pressure remains somewhat elevated at this time. He says he has been normotensive at home. He did denies PND, orthopnea, syncope, or claudication. ____________________________ PAST HISTORY  Past Medical Illnesses:  hyperlipidemia, obesity, prostate cancer treated with radiation and seed implants, DM-non-insulin  dependent, hypertension;  Cardiovascular Illnesses:  PVCs, S/P MI-inferior 1993, CAD, Carotid artery disease;  Surgical Procedures:  appendectomy, circumcision, carotid endarterectomy-rt December 2013;  NYHA Classification:  I;  Canadian Angina Classification:  Class 0: Asymptomatic;  Cardiology Procedures-Invasive:  cardiac cath (left) 1993;  Cardiology Procedures-Noninvasive:  treadmill Myoview;  Cardiac Cath Results:  normal Left main, scattered irregularities LAD, 40% stenosis proximal Ramus, occluded OM 2, 60% stenosis proximal PDA, dominant CFX, small and nondominant RCA;  LVEF of 52% documented via nuclear study on 02/13/2013,   ____________________________ CARDIO-PULMONARY TEST DATES EKG Date:  03/19/2017;   Cardiac Cath Date:  11/07/1991;  Nuclear Study Date:  02/13/2013;  Chest Xray Date: 09/02/2010;   ____________________________ FAMILY HISTORY Brother -- Brother alive with problem, Diabetes mellitus, Hypertension, Coronary Artery Disease Father -- Father dead, Coronary Artery Disease Mother -- Mother dead, 67 Sister -- Sister alive with problem, Coronary Artery Disease, Hypertension, Diabetes mellitus ____________________________ SOCIAL HISTORY Alcohol Use:  no alcohol use;  Smoking:  never smoked;  Diet:  regular diet without modifications;  Lifestyle:  married;  Exercise:  no regular exercise;  Occupation:  [employer];  Residence:  lives with wife and children;   ____________________________ REVIEW OF SYSTEMS General:  obesity Eyes: denies diplopia, history of glaucoma or visual problems. Respiratory: denies dyspnea, cough, wheezing or hemoptysis. Cardiovascular:  please review HPI Abdominal: denies dyspepsia, GI bleeding, constipation, or diarrhea Genitourinary-Male: history of bladder infection earlier in year  Musculoskeletal:  denies arthritis, venous insufficiency, or muscle weakness Neurological:  denies headaches, stroke, or TIA   ____________________________ PHYSICAL EXAMINATION VITAL SIGNS  Blood Pressure:  148/92 Sitting, Left arm, large cuff  , 150/94 Standing, Left arm  and large cuff   Pulse:  80/min. Weight:  225.00 lbs. Height:  70.00"BMI: 32  Constitutional:  pleasant white male in no acute distress, moderately obese Head:  normocephalic, normal hair pattern, no masses or tenderness Neck:  supple, without massess. No JVD, thyromegaly or carotid bruits. Carotid upstroke normal. Chest:  normal symmetry, clear to auscultation. Cardiac:  regular rhythm, normal S1 and S2, No S3 or S4, no murmurs, gallops or rubs detected. Peripheral Pulses:  the femoral,dorsalis pedis, and posterior tibial pulses are full and equal bilaterally with no bruits auscultated. Extremities & Back:  no edema present, no spinal abnormalities noted., mild bilateral venous insufficiency changes present Neurological:  no gross motor or sensory deficits noted, affect appropriate, oriented x3. ____________________________ MOST RECENT LIPID PANEL 02/02/17  CHOL TOTL 148 mg/dl, LDL 96 NM, HDL 35 mg/dl, TRIGLYCER 83 mg/dl and VLDL 17 ____________________________ IMPRESSIONS/PLAN  1. CAD with previous infarction and catheterization treated medically 2. Hypertensive heart disease above goal 3. Obesity continues to need to lose weight 3. Diabetes mellitus  Recommendations:  We discussed considering having stress testing but he would like to defer this at this time. We discussed getting regular exercise and efforts to lose additional weight. Recent lab work from primary care doctor reviewed. Since he wants to defer the catheterization will see him again next year and plan stress testing at that time. Twelve-lead EKG personally reviewed by me was normal. ____________________________ TODAYS ORDERS  1. 12 Lead EKG: Today  2. Return Visit: 1 year                       ____________________________ Cardiology Physician:  Kerry Hough  MD [facility]

## 2018-04-17 DIAGNOSIS — R69 Illness, unspecified: Secondary | ICD-10-CM | POA: Diagnosis not present

## 2018-04-25 ENCOUNTER — Ambulatory Visit: Payer: Medicare HMO | Admitting: Allergy and Immunology

## 2018-05-08 ENCOUNTER — Ambulatory Visit: Payer: Medicare HMO | Admitting: Allergy and Immunology

## 2018-05-08 ENCOUNTER — Encounter: Payer: Self-pay | Admitting: Allergy and Immunology

## 2018-05-08 VITALS — BP 116/74 | HR 72 | Resp 16

## 2018-05-08 DIAGNOSIS — Z91018 Allergy to other foods: Secondary | ICD-10-CM

## 2018-05-08 DIAGNOSIS — L501 Idiopathic urticaria: Secondary | ICD-10-CM

## 2018-05-08 DIAGNOSIS — T783XXD Angioneurotic edema, subsequent encounter: Secondary | ICD-10-CM | POA: Diagnosis not present

## 2018-05-08 MED ORDER — EPINEPHRINE 0.3 MG/0.3ML IJ SOAJ: INTRAMUSCULAR | 3 refills | Status: DC

## 2018-05-08 NOTE — Patient Instructions (Signed)
  1. Epi-Pen if needed.  2. Preventative treatment:   A. Loratadine 10mg  one tablet one time per day  B. Ranitidine 150mg  one tablet one time per day  3. "Action Plan":   A. use EpiPen  B. take Benadryl 50 mg  C. take prednisone 20 mg single dose clinic samples  4. Return in 12 months or earlier if problem

## 2018-05-08 NOTE — Progress Notes (Signed)
Follow-up Note  Referring Provider: Cyndi Bender, PA-C Primary Provider: Cyndi Bender, PA-C Date of Office Visit: 05/08/2018  Subjective:   Daniel Gibson (DOB: [birth year]) is a 67 y.o. male who returns to the [facility] on 05/08/2018 in re-evaluation of the following:  HPI: Daniel Gibson returns to this clinic in reevaluation of his intermittent urticaria and angioedema and history of alpha gal syndrome.  I have not seen him in this clinic since 09 April 2017.  He has not had any immunological episodes with urticaria or angioedema.  He remains away from consumption of meat.  The only time he did have an issue with swelling of his hand was after he received the shingles vaccine in May of this past year.  He did visit with Dr. Nelva Bush for that issue who recommended that he increase his H1 and H2 receptor blocker and possibly take some prednisone but he did not need to use any prednisone as his swelling resolved quickly.  Allergies as of 05/08/2018      Reactions   Ace Inhibitors Swelling   Beef-derived Products Anaphylaxis   Alpha-Gal allergy   Pork-derived Products Anaphylaxis   Alpha-Gal allergy   Sheep-derived Products Anaphylaxis   Alpha-Gal allergy   Tetanus Toxoids Swelling   Beef Extract       Medication List      amLODipine 10 MG tablet Commonly known as:  NORVASC Take 10 mg by mouth daily.   aspirin 81 MG tablet Take 81 mg by mouth daily.   atorvastatin 40 MG tablet Commonly known as:  LIPITOR Take 40 mg by mouth daily. Patient uses this medication at bedtime.   carvedilol 25 MG tablet Commonly known as:  COREG Take 12.5 mg by mouth 2 (two) times daily with a meal.   EPINEPHrine 0.3 mg/0.3 mL Soaj injection Commonly known as:  EPI-PEN Use as directed for life-threatening allergic reaction.   loratadine 10 MG tablet Commonly known as:  CLARITIN Take 10 mg by mouth 2 (two) times daily.   metFORMIN 1000 MG tablet Commonly known as:   GLUCOPHAGE Take 1,000 mg by mouth 2 (two) times daily with a meal.   nitroGLYCERIN 0.4 MG SL tablet Commonly known as:  NITROSTAT Place 0.4 mg under the tongue every 5 (five) minutes as needed.   ranitidine 150 MG tablet Commonly known as:  ZANTAC Take 150 mg by mouth 2 (two) times daily.   tamsulosin 0.4 MG Caps capsule Commonly known as:  FLOMAX Take 0.8 mg by mouth daily.   trospium 20 MG tablet Commonly known as:  SANCTURA Take 20 mg by mouth 2 (two) times daily.       Past Medical History:  Diagnosis Date  . Carotid artery disease ([place]) 05/08/2012   CEA on left 2013  . Diabetes mellitus without complication ([place])   . Food allergy    Alpha Gal allergy   . Hyperlipidemia   . Hypertensive heart disease without CHF 04/08/2018  . Myocardial infarction ([place]) 1993   medically managed  . Obesity (BMI 30-39.9) 04/08/2018  . Prostate cancer ([place]) 08/07/2016  . PVC's (premature ventricular contractions) 04/08/2018  . Thrombocytopenia ([place])     Past Surgical History:  Procedure Laterality Date  . APPENDECTOMY    . CAROTID ENDARTERECTOMY Right 06-07-12   cea  . ENDARTERECTOMY  06/07/2012   Procedure: ENDARTERECTOMY CAROTID;  Surgeon: Angelia Mould, MD;  Location[facility];  Service: Vascular;  Laterality: Right;  Right Carotid Endarterectomy with Patch  Angioplasty  . TRANSPERINEAL IMPLANT OF RADIATION SEEDS W/ ULTRASOUND      Review of systems negative except as noted in HPI / PMHx or noted below:  Review of Systems  Constitutional: Negative.   HENT: Negative.   Eyes: Negative.   Respiratory: Negative.   Cardiovascular: Negative.   Gastrointestinal: Negative.   Genitourinary: Negative.   Musculoskeletal: Negative.   Skin: Negative.   Neurological: Negative.   Endo/Heme/Allergies: Negative.   Psychiatric/Behavioral: Negative.      Objective:   Vitals:   05/08/18 0840  BP: 116/74  Pulse: 72  Resp: 16          Physical Exam  HENT:  Head:  Normocephalic.  Right Ear: Tympanic membrane, external ear and ear canal normal.  Left Ear: Tympanic membrane, external ear and ear canal normal.  Nose: Nose normal. No mucosal edema or rhinorrhea.  Mouth/Throat: Uvula is midline, oropharynx is clear and moist and mucous membranes are normal. No oropharyngeal exudate.  Eyes: Conjunctivae are normal.  Neck: Trachea normal. No tracheal tenderness present. No tracheal deviation present. No thyromegaly present.  Cardiovascular: Normal rate, regular rhythm, S1 normal, S2 normal and normal heart sounds.  No murmur heard. Pulmonary/Chest: Breath sounds normal. No stridor. No respiratory distress. He has no wheezes. He has no rales.  Musculoskeletal: He exhibits no edema.  Lymphadenopathy:       Head (right side): No tonsillar adenopathy present.       Head (left side): No tonsillar adenopathy present.    He has no cervical adenopathy.  Neurological: He is alert.  Skin: No rash noted. He is not diaphoretic. No erythema. Nails show no clubbing.    Diagnostics: none  Assessment and Plan:   1. Allergy to meat   2. Angioedema, subsequent encounter   3. Urticaria, idiopathic     1. Epi-Pen if needed.  2. Preventative treatment:   A. Loratadine 10mg  one tablet one time per day  B. Ranitidine 150mg  one tablet one time per day  3. "Action Plan":   A. use EpiPen  B. take Benadryl 50 mg  C. take prednisone 20 mg single dose clinic samples  4. Return in 12 months or earlier if problem   Overall Daniel Gibson is doing quite well as long as he continues to avoid mammal consumption.  I will see him back in this clinic in 12 months or earlier if there is a problem.  Allena Katz, MD [facility] [place]

## 2018-05-09 ENCOUNTER — Encounter: Payer: Self-pay | Admitting: Allergy and Immunology

## 2018-05-10 NOTE — Progress Notes (Signed)
Cardiology Office Note:    Date:  05/13/2018   ID:  Daniel Gibson, DOB [birth year], MRN [id number]  PCP:  Cyndi Bender, PA-C  Cardiologist:  Shirlee More, MD    Referring MD: Cyndi Bender, PA-C    ASSESSMENT:    1. Coronary artery disease of native artery of native heart with stable angina pectoris ([place])   2. Hypertensive heart disease without CHF   3. PVC's (premature ventricular contractions)   4. Carotid artery disease, unspecified laterality, unspecified type ([place])   5. Hyperlipidemia, unspecified hyperlipidemia type    PLAN:    In order of problems listed above:  1. Coronary artery disease is stable he has had no anginal discomfort has normal exercise tolerance and being asymptomatic with mild nonobstructive CAD normal exercise tolerance to continue medical therapy including aspirin beta-blocker calcium channel blocker and high intensity statin. 2. Stable blood pressure target continue current treatment 3. Asymptomatic continue beta-blocker 4. Follow-up with vascular surgery tomorrow for his carotid disease he has no bruit 5. Dyslipidemia unfortunately is not a target his recent LDL was in the low 80s I will add Zetia to his high intensity statin to achieve LDLs of less than 55   Next appointment: One year   Medication Adjustments/Labs and Tests Ordered: Current medicines are reviewed at length with the patient today.  Concerns regarding medicines are outlined above.  Orders Placed This Encounter  Procedures  . EKG 12-Lead   Meds ordered this encounter  Medications  . atorvastatin (LIPITOR) 40 MG tablet    Sig: Take 1 tablet (40 mg total) by mouth daily. Patient uses this medication at bedtime.    Dispense:  90 tablet    Refill:  3  . ezetimibe (ZETIA) 10 MG tablet    Sig: Take 1 tablet (10 mg total) by mouth daily.    Dispense:  90 tablet    Refill:  3    Chief Complaint  Patient presents with  . Follow-up  . Coronary Artery Disease  . Hypertension    . Hyperlipidemia    History of Present Illness:    Daniel Gibson is a 67 y.o. male with a hx of CAD hypertension hyperlipidemia PVC's and carotid stenosis last seen 03/19/17. Coronary angiography showed mild CAD 60% proximal PDA 40% ramus ejection fraction 52% by gated pool 02/13/2013 date of coronary angiography was 11/07/1991 other problems have included inferior wall myocardial infarction 1993 and carotid artery disease with right carotid artery endarterectomy December 2013.  08/06/2016 he was admitted to the hospital with urinary tract infection renal function was normal he had abnormal liver function the bilirubin of 2.1 transaminases and alkaline phosphatase were normal CBC surprisingly was normal at discharge white count 6200 although was elevated to 12,200 on admission hemoglobin 13.2. Compliance with diet, lifestyle and medications: Yes  He is scheduled to see vascular surgery tomorrow in follow-up for his carotid endarterectomy he has had no TIA no chest pain shortness of breath palpitation or syncope and gets regular physical activity walking exercise.  Recent labs looked at from his PCP office and is in the hospital in 2018 bilirubin is elevated he tells me his CMP was normal although I do not see liver function Past Medical History:  Diagnosis Date  . Carotid artery disease ([place]) 05/08/2012   CEA on left 2013  . Diabetes mellitus without complication ([place])   . Food allergy    Alpha Gal allergy   . Hyperlipidemia   . Hypertensive heart disease without CHF 04/08/2018  .  Myocardial infarction ([place]) 1993   medically managed  . Obesity (BMI 30-39.9) 04/08/2018  . Prostate cancer ([place]) 08/07/2016  . PVC's (premature ventricular contractions) 04/08/2018  . Thrombocytopenia ([place])     Past Surgical History:  Procedure Laterality Date  . APPENDECTOMY    . CAROTID ENDARTERECTOMY Right 06-07-12   cea  . ENDARTERECTOMY  06/07/2012   Procedure: ENDARTERECTOMY CAROTID;  Surgeon:  Angelia Mould, MD;  [facility];  Service: Vascular;  Laterality: Right;  Right Carotid Endarterectomy with Patch Angioplasty  . TRANSPERINEAL IMPLANT OF RADIATION SEEDS W/ ULTRASOUND      Current Medications: Current Meds  Medication Sig  . amLODipine (NORVASC) 10 MG tablet Take 10 mg by mouth daily.  [name] aspirin 81 MG tablet Take 81 mg by mouth daily.  [name] atorvastatin (LIPITOR) 40 MG tablet Take 1 tablet (40 mg total) by mouth daily. Patient uses this medication at bedtime.  . carvedilol (COREG) 25 MG tablet Take 12.5 mg by mouth 2 (two) times daily with a meal.  . EPINEPHrine (EPIPEN 2-PAK) 0.3 mg/0.3 mL IJ SOAJ injection Use as directed for life-threatening allergic reaction.  [name] loratadine (CLARITIN) 10 MG tablet Take 10 mg by mouth daily.   . metFORMIN (GLUCOPHAGE) 1000 MG tablet Take 1,000 mg by mouth daily.   . nitroGLYCERIN (NITROSTAT) 0.4 MG SL tablet Place 0.4 mg under the tongue every 5 (five) minutes as needed.  . ranitidine (ZANTAC) 150 MG tablet Take 150 mg by mouth 2 (two) times daily.  . Tamsulosin HCl (FLOMAX) 0.4 MG CAPS Take 0.8 mg by mouth daily.   . Trospium Chloride 60 MG CP24 Take 1 capsule by mouth daily.  . [DISCONTINUED] atorvastatin (LIPITOR) 40 MG tablet Take 40 mg by mouth daily. Patient uses this medication at bedtime.     Allergies:   Ace inhibitors; Beef-derived products; Pork-derived products; Sheep-derived products; and Tetanus toxoids   Social History   Socioeconomic History  . Marital status: Married    Spouse name: Not on file  . Number of children: Not on file  . Years of education: Not on file  . Highest education level: Not on file  Occupational History  . Not on file  Social Needs  . Financial resource strain: Not on file  . Food insecurity:    Worry: Not on file    Inability: Not on file  . Transportation needs:    Medical: Not on file    Non-medical: Not on file  Tobacco Use  . Smoking status: Never Smoker  . Smokeless  tobacco: Never Used  Substance and Sexual Activity  . Alcohol use: No  . Drug use: No  . Sexual activity: Not on file  Lifestyle  . Physical activity:    Days per week: Not on file    Minutes per session: Not on file  . Stress: Not on file  Relationships  . Social connections:    Talks on phone: Not on file    Gets together: Not on file    Attends religious service: Not on file    Active member of club or organization: Not on file    Attends meetings of clubs or organizations: Not on file    Relationship status: Not on file  Other Topics Concern  . Not on file  Social History Narrative  . Not on file     Family History: The patient's family history includes Cancer in his mother; Diabetes in his brother and sister; Heart attack in his  father; Heart disease in his brother, father, and sister; Hyperlipidemia in his brother, father, mother, and sister; Hypertension in his brother, father, mother, and sister. ROS:   Please see the history of present illness.    All other systems reviewed and are negative.  EKGs/Labs/Other Studies Reviewed:    The following studies were reviewed today:  EKG:  EKG ordered today.  The ekg ordered today demonstrates sinus rhythm old inferior infarction nonspecific ST-T  Recent Lipid Panel   02/02/17 CHOL TOTL 148 mg/dl, LDL 96 NM, HDL 35 mg/dl, TRIGLYCER 83 mg/dl and VLDL 17   Physical Exam:    VS:  BP 118/72 (BP Location: Right Arm, Patient Position: Sitting, Cuff Size: Large)   Pulse 64   Ht 5\' 10"  (1.778 m)   Wt 208 lb (94.3 kg)   SpO2 96%   BMI 29.84 kg/m     Wt Readings from Last 3 Encounters:  05/13/18 208 lb (94.3 kg)  04/04/17 224 lb (101.6 kg)  02/04/14 219 lb (99.3 kg)     GEN:  Well nourished, well developed in no acute distress HEENT: Normal NECK: No JVD; No carotid bruits LYMPHATICS: No lymphadenopathy CARDIAC: RRR, no murmurs, rubs, gallops RESPIRATORY:  Clear to auscultation without rales, wheezing or rhonchi    ABDOMEN: Soft, non-tender, non-distended MUSCULOSKELETAL:  No edema; No deformity  SKIN: Warm and dry NEUROLOGIC:  Alert and oriented x 3 PSYCHIATRIC:  Normal affect    Signed, Shirlee More, MD  05/13/2018 8:58 AM    [place]

## 2018-05-13 ENCOUNTER — Encounter: Payer: Self-pay | Admitting: Cardiology

## 2018-05-13 ENCOUNTER — Ambulatory Visit (INDEPENDENT_AMBULATORY_CARE_PROVIDER_SITE_OTHER): Payer: Medicare HMO | Admitting: Cardiology

## 2018-05-13 VITALS — BP 118/72 | HR 64 | Ht 70.0 in | Wt 208.0 lb

## 2018-05-13 DIAGNOSIS — I493 Ventricular premature depolarization: Secondary | ICD-10-CM | POA: Diagnosis not present

## 2018-05-13 DIAGNOSIS — E785 Hyperlipidemia, unspecified: Secondary | ICD-10-CM | POA: Diagnosis not present

## 2018-05-13 DIAGNOSIS — I25118 Atherosclerotic heart disease of native coronary artery with other forms of angina pectoris: Secondary | ICD-10-CM

## 2018-05-13 DIAGNOSIS — I119 Hypertensive heart disease without heart failure: Secondary | ICD-10-CM

## 2018-05-13 DIAGNOSIS — I739 Peripheral vascular disease, unspecified: Secondary | ICD-10-CM | POA: Diagnosis not present

## 2018-05-13 DIAGNOSIS — I779 Disorder of arteries and arterioles, unspecified: Secondary | ICD-10-CM

## 2018-05-13 MED ORDER — EZETIMIBE 10 MG PO TABS: 10.0000 mg | ORAL_TABLET | Freq: Every day | ORAL | 3 refills | Status: DC

## 2018-05-13 MED ORDER — ATORVASTATIN CALCIUM 40 MG PO TABS: 40.0000 mg | ORAL_TABLET | Freq: Every day | ORAL | 3 refills | Status: DC

## 2018-05-13 NOTE — Patient Instructions (Signed)
Medication Instructions:  Your physician has recommended you make the following change in your medication:   START ezetimibe (zetia) 10 mg: Take 1 tablet daily  If you need a refill on your cardiac medications before your next appointment, please call your pharmacy.   Lab work: None  If you have labs (blood work) drawn today and your tests are completely normal, you will receive your results only by: [name] MyChart Message (if you have MyChart) OR . A paper copy in the mail If you have any lab test that is abnormal or we need to change your treatment, we will call you to review the results.  Testing/Procedures: You had an EKG today.   Follow-Up: At [facility], you and your health needs are our priority.  As part of our continuing mission to provide you with exceptional heart care, we have created designated Provider Care Teams.  These Care Teams include your primary Cardiologist (physician) and Advanced Practice Providers (APPs -  Physician Assistants and Nurse Practitioners) who all work together to provide you with the care you need, when you need it. You will need a follow up appointment in 1 years.  Please call our office 2 months in advance to schedule this appointment.        Ezetimibe Tablets What is this medicine? EZETIMIBE (ez ET i mibe) blocks the absorption of cholesterol from the stomach. It can help lower blood cholesterol for patients who are at risk of getting heart disease or a stroke. It is only for patients whose cholesterol level is not controlled by diet. This medicine may be used for other purposes; ask your health care provider or pharmacist if you have questions. COMMON BRAND NAME(S): Zetia What should I tell my health care provider before I take this medicine? They need to know if you have any of these conditions: -liver disease -an unusual or allergic reaction to ezetimibe, medicines, foods, dyes, or preservatives -pregnant or trying to get  pregnant -breast-feeding How should I use this medicine? Take this medicine by mouth with a glass of water. Follow the directions on the prescription label. This medicine can be taken with or without food. Take your doses at regular intervals. Do not take your medicine more often than directed. Talk to your pediatrician regarding the use of this medicine in children. Special care may be needed. Overdosage: If you think you have taken too much of this medicine contact a poison control center or emergency room at once. NOTE: This medicine is only for you. Do not share this medicine with others. What if I miss a dose? If you miss a dose, take it as soon as you can. If it is almost time for your next dose, take only that dose. Do not take double or extra doses. What may interact with this medicine? Do not take this medicine with any of the following medications: -fenofibrate -gemfibrozil This medicine may also interact with the following medications: -antacids -cyclosporine -herbal medicines like red yeast rice -other medicines to lower cholesterol or triglycerides This list may not describe all possible interactions. Give your health care provider a list of all the medicines, herbs, non-prescription drugs, or dietary supplements you use. Also tell them if you smoke, drink alcohol, or use illegal drugs. Some items may interact with your medicine. What should I watch for while using this medicine? Visit your doctor or health care professional for regular checks on your progress. You will need to have your cholesterol levels checked. If you are  also taking some other cholesterol medicines, you will also need to have tests to make sure your liver is working properly. Tell your doctor or health care professional if you get any unexplained muscle pain, tenderness, or weakness, especially if you also have a fever and tiredness. You need to follow a low-cholesterol, low-fat diet while you are taking this  medicine. This will decrease your risk of getting heart and blood vessel disease. Exercising and avoiding alcohol and smoking can also help. Ask your doctor or dietician for advice. What side effects may I notice from receiving this medicine? Side effects that you should report to your doctor or health care professional as soon as possible: -allergic reactions like skin rash, itching or hives, swelling of the face, lips, or tongue -dark yellow or brown urine -unusually weak or tired -yellowing of the skin or eyes Side effects that usually do not require medical attention (report to your doctor or health care professional if they continue or are bothersome): -diarrhea -dizziness -headache -stomach upset or pain This list may not describe all possible side effects. Call your doctor for medical advice about side effects. You may report side effects to FDA at 1-800-FDA-1088. Where should I keep my medicine? Keep out of the reach of children. Store at room temperature between 15 and 30 degrees C (59 and 86 degrees F). Protect from moisture. Keep container tightly closed. Throw away any unused medicine after the expiration date. NOTE: This sheet is a summary. It may not cover all possible information. If you have questions about this medicine, talk to your doctor, pharmacist, or health care provider.  2018 Elsevier/Gold Standard (2011-12-18 15:39:09)

## 2018-05-15 ENCOUNTER — Ambulatory Visit: Payer: Medicare HMO | Admitting: Vascular Surgery

## 2018-05-15 ENCOUNTER — Encounter (HOSPITAL_COMMUNITY): Payer: Medicare HMO

## 2018-06-06 DIAGNOSIS — R69 Illness, unspecified: Secondary | ICD-10-CM | POA: Diagnosis not present

## 2018-06-25 DIAGNOSIS — I10 Essential (primary) hypertension: Secondary | ICD-10-CM | POA: Diagnosis not present

## 2018-06-25 DIAGNOSIS — I2510 Atherosclerotic heart disease of native coronary artery without angina pectoris: Secondary | ICD-10-CM | POA: Diagnosis not present

## 2018-06-25 DIAGNOSIS — E1129 Type 2 diabetes mellitus with other diabetic kidney complication: Secondary | ICD-10-CM | POA: Diagnosis not present

## 2018-06-25 DIAGNOSIS — T783XXA Angioneurotic edema, initial encounter: Secondary | ICD-10-CM | POA: Diagnosis not present

## 2018-06-28 DIAGNOSIS — N529 Male erectile dysfunction, unspecified: Secondary | ICD-10-CM | POA: Diagnosis not present

## 2018-06-28 DIAGNOSIS — I6529 Occlusion and stenosis of unspecified carotid artery: Secondary | ICD-10-CM | POA: Diagnosis not present

## 2018-06-28 DIAGNOSIS — Z6831 Body mass index (BMI) 31.0-31.9, adult: Secondary | ICD-10-CM | POA: Diagnosis not present

## 2018-06-28 DIAGNOSIS — E1129 Type 2 diabetes mellitus with other diabetic kidney complication: Secondary | ICD-10-CM | POA: Diagnosis not present

## 2018-06-28 DIAGNOSIS — G4700 Insomnia, unspecified: Secondary | ICD-10-CM | POA: Diagnosis not present

## 2018-06-28 DIAGNOSIS — I2510 Atherosclerotic heart disease of native coronary artery without angina pectoris: Secondary | ICD-10-CM | POA: Diagnosis not present

## 2018-06-28 DIAGNOSIS — I10 Essential (primary) hypertension: Secondary | ICD-10-CM | POA: Diagnosis not present

## 2018-07-03 ENCOUNTER — Ambulatory Visit: Payer: Medicare HMO | Admitting: Vascular Surgery

## 2018-07-03 ENCOUNTER — Encounter (HOSPITAL_COMMUNITY): Payer: Medicare HMO

## 2018-07-10 ENCOUNTER — Other Ambulatory Visit: Payer: Self-pay

## 2018-07-10 ENCOUNTER — Ambulatory Visit (HOSPITAL_COMMUNITY)
Admission: RE | Admit: 2018-07-10 | Discharge: 2018-07-10 | Disposition: A | Discharge status: Home / Self Care | Payer: Medicare HMO | Source: Ambulatory Visit | Attending: Vascular Surgery | Admitting: Vascular Surgery

## 2018-07-10 ENCOUNTER — Ambulatory Visit (INDEPENDENT_AMBULATORY_CARE_PROVIDER_SITE_OTHER): Payer: Medicare HMO | Admitting: Vascular Surgery

## 2018-07-10 ENCOUNTER — Encounter: Payer: Self-pay | Admitting: Vascular Surgery

## 2018-07-10 VITALS — BP 154/92 | HR 77 | Temp 97.4°F | Resp 16 | Ht 70.0 in | Wt 205.0 lb

## 2018-07-10 DIAGNOSIS — I6523 Occlusion and stenosis of bilateral carotid arteries: Secondary | ICD-10-CM

## 2018-07-10 NOTE — Progress Notes (Signed)
Patient name: Daniel Gibson MRN: [id number] DOB: 06/25/1951 Sex: male  REASON FOR VISIT:   Follow-up of carotid disease.  HPI:   Daniel Gibson is a pleasant 68 y.o. male who I last saw 1 year ago on 04/04/2017.  He underwent a right carotid endarterectomy in December 2013.  When I saw him last he had some mild recurrent stenosis at the distal end of his patch with a 40 to 59% stenosis.  He had a 40 to 59% left carotid stenosis also.  I set him up for a one-year follow-up visit.  Since I saw him last, he denies any history of stroke, TIAs, expressive or receptive aphasia, or amaurosis fugax.  His blood pressure has been under good control.  He is on aspirin, and a statin.  He is not a smoker.  He remains fairly active.  There have been no significant changes in his medical history over the last year.  Past Medical History:  Diagnosis Date  . Carotid artery disease ([place]) 05/08/2012   CEA on left 2013  . Diabetes mellitus without complication ([place])   . Food allergy    Alpha Gal allergy   . Hyperlipidemia   . Hypertensive heart disease without CHF 04/08/2018  . Myocardial infarction ([place]) 1993   medically managed  . Obesity (BMI 30-39.9) 04/08/2018  . Prostate cancer ([place]) 08/07/2016  . PVC's (premature ventricular contractions) 04/08/2018  . Thrombocytopenia ([place])     Family History  Problem Relation Age of Onset  . Cancer Mother   . Hyperlipidemia Mother   . Hypertension Mother   . Heart attack Father   . Heart disease Father        Heart Disease before age 58  . Hyperlipidemia Father   . Hypertension Father   . Diabetes Sister   . Heart disease Sister        before age 61  . Hypertension Sister   . Hyperlipidemia Sister   . Diabetes Brother   . Heart disease Brother        before age 73  . Hyperlipidemia Brother   . Hypertension Brother     SOCIAL HISTORY: Social History   Tobacco Use  . Smoking status: Never Smoker  . Smokeless tobacco: Never Used    Substance Use Topics  . Alcohol use: No    Allergies  Allergen Reactions  . Ace Inhibitors Swelling  . Beef-Derived Products Anaphylaxis    Alpha-Gal allergy  . Pork-Derived Products Anaphylaxis    Alpha-Gal allergy  . Sheep-Derived Products Anaphylaxis    Alpha-Gal allergy  . Tetanus Toxoids Swelling    Current Outpatient Medications  Medication Sig Dispense Refill  . amLODipine (NORVASC) 10 MG tablet Take 10 mg by mouth daily.    [name] aspirin 81 MG tablet Take 81 mg by mouth daily.    [name] atorvastatin (LIPITOR) 40 MG tablet Take 1 tablet (40 mg total) by mouth daily. Patient uses this medication at bedtime. 90 tablet 3  . carvedilol (COREG) 25 MG tablet Take 12.5 mg by mouth 2 (two) times daily with a meal.    . EPINEPHrine (EPIPEN 2-PAK) 0.3 mg/0.3 mL IJ SOAJ injection Use as directed for life-threatening allergic reaction. 2 Device 3  . ezetimibe (ZETIA) 10 MG tablet Take 1 tablet (10 mg total) by mouth daily. 90 tablet 3  . loratadine (CLARITIN) 10 MG tablet Take 10 mg by mouth daily.     . metFORMIN (GLUCOPHAGE) 1000 MG tablet Take 1,000 mg by mouth  daily.     . nitroGLYCERIN (NITROSTAT) 0.4 MG SL tablet Place 0.4 mg under the tongue every 5 (five) minutes as needed.    . ranitidine (ZANTAC) 150 MG tablet Take 150 mg by mouth 2 (two) times daily.    . Tamsulosin HCl (FLOMAX) 0.4 MG CAPS Take 0.8 mg by mouth daily.     . Trospium Chloride 60 MG CP24 Take 1 capsule by mouth daily.     No current facility-administered medications for this visit.     REVIEW OF SYSTEMS:  [X]  denotes positive finding, [ ]  denotes negative finding Cardiac  Comments:  Chest pain or chest pressure:    Shortness of breath upon exertion:    Short of breath when lying flat:    Irregular heart rhythm:        Vascular    Pain in calf, thigh, or hip brought on by ambulation:    Pain in feet at night that wakes you up from your sleep:     Blood clot in your veins:    Leg swelling:         Pulmonary     Oxygen at home:    Productive cough:     Wheezing:         Neurologic    Sudden weakness in arms or legs:     Sudden numbness in arms or legs:     Sudden onset of difficulty speaking or slurred speech:    Temporary loss of vision in one eye:     Problems with dizziness:         Gastrointestinal    Blood in stool:     Vomited blood:         Genitourinary    Burning when urinating:     Blood in urine:        Psychiatric    Major depression:         Hematologic    Bleeding problems:    Problems with blood clotting too easily:        Skin    Rashes or ulcers:        Constitutional    Fever or chills:     PHYSICAL EXAM:   Vitals:   07/10/18 0849 07/10/18 0852  BP: (!) 162/92 (!) 154/92  Pulse: 77   Resp: 16   Temp: (!) 97.4 F (36.3 C)   TempSrc: Oral   SpO2: 97%   Weight: 205 lb (93 kg)   Height: 5\' 10"  (1.778 m)     GENERAL: The patient is a well-nourished male, in no acute distress. The vital signs are documented above. CARDIAC: There is a regular rate and rhythm.  VASCULAR: I do not detect carotid bruits. He has palpable pedal pulses. PULMONARY: There is good air exchange bilaterally without wheezing or rales. ABDOMEN: Soft and non-tender with normal pitched bowel sounds.  MUSCULOSKELETAL: There are no major deformities or cyanosis. NEUROLOGIC: No focal weakness or paresthesias are detected. SKIN: There are no ulcers or rashes noted. PSYCHIATRIC: The patient has a normal affect.  DATA:    CAROTID DUPLEX: I have independently interpreted his carotid duplex scan today.  On the right side there is a 40 to 59% carotid stenosis.  The right vertebral artery is patent with antegrade flow.  On the left side there is a 40 to 59% carotid stenosis.  The left vertebral artery is patent with antegrade flow.  There was some evidence of stenosis in the left subclavian artery.  MEDICAL ISSUES:   BILATERAL CAROTID DISEASE: This patient underwent a right carotid  endarterectomy in December 2013.  He has a moderate 40 to 59% recurrent right carotid stenosis at the distal end of the patch.  This is stable.  He also has a 40 to 59% left carotid stenosis which is stable.  He understands we would not consider carotid endarterectomy unless the stenosis progressed to greater than 80% or he develop new neurologic symptoms.  We have discussed the symptoms.  I ordered a follow-up duplex scan in 1 year and I will see him back at that time.  He knows to call sooner if he has problems.  In the meantime he knows to remain on his aspirin and his statin.  We have also discussed the importance of nutrition and exercise.  Deitra Mayo [facility] [phone]

## 2018-09-19 DIAGNOSIS — R69 Illness, unspecified: Secondary | ICD-10-CM | POA: Diagnosis not present

## 2018-12-31 DIAGNOSIS — T783XXA Angioneurotic edema, initial encounter: Secondary | ICD-10-CM | POA: Diagnosis not present

## 2018-12-31 DIAGNOSIS — I2510 Atherosclerotic heart disease of native coronary artery without angina pectoris: Secondary | ICD-10-CM | POA: Diagnosis not present

## 2018-12-31 DIAGNOSIS — E1129 Type 2 diabetes mellitus with other diabetic kidney complication: Secondary | ICD-10-CM | POA: Diagnosis not present

## 2018-12-31 DIAGNOSIS — I10 Essential (primary) hypertension: Secondary | ICD-10-CM | POA: Diagnosis not present

## 2019-01-17 DIAGNOSIS — I6529 Occlusion and stenosis of unspecified carotid artery: Secondary | ICD-10-CM | POA: Diagnosis not present

## 2019-01-17 DIAGNOSIS — E1129 Type 2 diabetes mellitus with other diabetic kidney complication: Secondary | ICD-10-CM | POA: Diagnosis not present

## 2019-01-17 DIAGNOSIS — Z6831 Body mass index (BMI) 31.0-31.9, adult: Secondary | ICD-10-CM | POA: Diagnosis not present

## 2019-01-17 DIAGNOSIS — N529 Male erectile dysfunction, unspecified: Secondary | ICD-10-CM | POA: Diagnosis not present

## 2019-01-17 DIAGNOSIS — I2510 Atherosclerotic heart disease of native coronary artery without angina pectoris: Secondary | ICD-10-CM | POA: Diagnosis not present

## 2019-01-17 DIAGNOSIS — G4700 Insomnia, unspecified: Secondary | ICD-10-CM | POA: Diagnosis not present

## 2019-01-17 DIAGNOSIS — I10 Essential (primary) hypertension: Secondary | ICD-10-CM | POA: Diagnosis not present

## 2019-01-22 DIAGNOSIS — R69 Illness, unspecified: Secondary | ICD-10-CM | POA: Diagnosis not present

## 2019-02-24 DIAGNOSIS — E785 Hyperlipidemia, unspecified: Secondary | ICD-10-CM | POA: Diagnosis not present

## 2019-02-24 DIAGNOSIS — Z1331 Encounter for screening for depression: Secondary | ICD-10-CM | POA: Diagnosis not present

## 2019-02-24 DIAGNOSIS — Z6831 Body mass index (BMI) 31.0-31.9, adult: Secondary | ICD-10-CM | POA: Diagnosis not present

## 2019-02-24 DIAGNOSIS — Z125 Encounter for screening for malignant neoplasm of prostate: Secondary | ICD-10-CM | POA: Diagnosis not present

## 2019-02-24 DIAGNOSIS — Z0000 Encounter for general adult medical examination without abnormal findings: Secondary | ICD-10-CM | POA: Diagnosis not present

## 2019-02-24 DIAGNOSIS — R69 Illness, unspecified: Secondary | ICD-10-CM | POA: Diagnosis not present

## 2019-02-24 DIAGNOSIS — Z9181 History of falling: Secondary | ICD-10-CM | POA: Diagnosis not present

## 2019-03-26 ENCOUNTER — Telehealth: Payer: Self-pay | Admitting: Cardiology

## 2019-03-26 NOTE — Telephone Encounter (Signed)
°*  STAT* If patient is at the pharmacy, call can be transferred to refill team.   1. Which medications need to be refilled? (please list name of each medication and dose if known) Trospium chloride er 60mg   2. Which pharmacy/location (including street and city if local pharmacy) is medication to be sent to?[id number]  3. Do they need a 30 day or 90 day supply? [place]

## 2019-03-27 NOTE — Telephone Encounter (Signed)
Left message advising patient to reach out to the prescribing doctor for trospium as this is not a cardiac medication so we will not refill. Informed him to contact our office with any further questions or concerns.

## 2019-03-28 DIAGNOSIS — D2371 Other benign neoplasm of skin of right lower limb, including hip: Secondary | ICD-10-CM | POA: Diagnosis not present

## 2019-03-28 DIAGNOSIS — L821 Other seborrheic keratosis: Secondary | ICD-10-CM | POA: Diagnosis not present

## 2019-03-28 DIAGNOSIS — D2262 Melanocytic nevi of left upper limb, including shoulder: Secondary | ICD-10-CM | POA: Diagnosis not present

## 2019-03-28 DIAGNOSIS — D1801 Hemangioma of skin and subcutaneous tissue: Secondary | ICD-10-CM | POA: Diagnosis not present

## 2019-03-28 DIAGNOSIS — D225 Melanocytic nevi of trunk: Secondary | ICD-10-CM | POA: Diagnosis not present

## 2019-03-28 DIAGNOSIS — L814 Other melanin hyperpigmentation: Secondary | ICD-10-CM | POA: Diagnosis not present

## 2019-04-01 ENCOUNTER — Other Ambulatory Visit: Payer: Self-pay | Admitting: *Deleted

## 2019-04-01 MED ORDER — TROSPIUM CHLORIDE ER 60 MG PO CP24: 1.0000 | ORAL_CAPSULE | Freq: Every day | ORAL | 0 refills | Status: DC

## 2019-04-03 DIAGNOSIS — C61 Malignant neoplasm of prostate: Secondary | ICD-10-CM | POA: Diagnosis not present

## 2019-04-10 DIAGNOSIS — R3915 Urgency of urination: Secondary | ICD-10-CM | POA: Diagnosis not present

## 2019-04-10 DIAGNOSIS — N5201 Erectile dysfunction due to arterial insufficiency: Secondary | ICD-10-CM | POA: Diagnosis not present

## 2019-04-10 DIAGNOSIS — R3121 Asymptomatic microscopic hematuria: Secondary | ICD-10-CM | POA: Diagnosis not present

## 2019-04-10 DIAGNOSIS — C61 Malignant neoplasm of prostate: Secondary | ICD-10-CM | POA: Diagnosis not present

## 2019-05-08 ENCOUNTER — Other Ambulatory Visit: Payer: Self-pay

## 2019-05-08 ENCOUNTER — Ambulatory Visit: Payer: Medicare HMO | Admitting: Allergy and Immunology

## 2019-05-08 ENCOUNTER — Encounter: Payer: Self-pay | Admitting: Allergy and Immunology

## 2019-05-08 VITALS — BP 152/82 | HR 72 | Temp 97.7°F | Resp 12 | Ht 67.0 in | Wt 206.8 lb

## 2019-05-08 DIAGNOSIS — T7800XA Anaphylactic reaction due to unspecified food, initial encounter: Secondary | ICD-10-CM | POA: Diagnosis not present

## 2019-05-08 MED ORDER — EPINEPHRINE 0.3 MG/0.3ML IJ SOAJ: INTRAMUSCULAR | 3 refills | Status: DC

## 2019-05-08 NOTE — Patient Instructions (Signed)
  1.  "Action Plan":   A. use EpiPen  B. take Benadryl 50 mg  C. take prednisone 20 mg single dose clinic samples  2. Obtain COVID vaccine when available  3. Check alpha gal panel this January with primary doctor  4. Return in 12 months or earlier if problem

## 2019-05-08 NOTE — Progress Notes (Signed)
[place]   Follow-up Note  Referring Provider: Cyndi Bender, PA-C Primary Provider: Cyndi Bender, Hershal Coria Date of Office Visit: 05/08/2019  Subjective:   Daniel Gibson (DOB: 08-03-1950) is a 68 y.o. male who returns to the [facility] on 05/08/2019 in re-evaluation of the following:  HPI: Daniel Gibson returns to this clinic in reevaluation of intermittent urticaria and angioedema in the context of alpha gal syndrome.  He was last seen in this clinic on 08 May 2018.  He has not had any urticaria or angioedema and he has tapered off his H1 and H2 receptor blocker.  He remains away from mammal consumption.  His last check of alpha gal titer was 1.08 KU/L in July 2020.  He usually has his levels checked every 6 months by his primary care doctor.  He did obtain the flu vaccine this year.  Allergies as of 05/08/2019      Reactions   Ace Inhibitors Swelling   Beef-derived Products Anaphylaxis   Alpha-Gal allergy   Pork-derived Products Anaphylaxis   Alpha-Gal allergy   Sheep-derived Products Anaphylaxis   Alpha-Gal allergy   Tetanus Toxoids Swelling      Medication List    amLODipine 10 MG tablet Commonly known as: NORVASC Take 10 mg by mouth daily.   aspirin 81 MG tablet Take 81 mg by mouth daily.   atorvastatin 40 MG tablet Commonly known as: LIPITOR Take 1 tablet (40 mg total) by mouth daily. Patient uses this medication at bedtime.   carvedilol 25 MG tablet Commonly known as: COREG Take 12.5 mg by mouth 2 (two) times daily with a meal.   EPINEPHrine 0.3 mg/0.3 mL Soaj injection Commonly known as: EpiPen 2-Pak Use as directed for life-threatening allergic reaction.   ezetimibe 10 MG tablet Commonly known as: ZETIA Take 1 tablet (10 mg total) by mouth daily.   fluticasone 50 MCG/ACT nasal spray Commonly known as: FLONASE Place 2 sprays into both nostrils daily.   loratadine 10 MG tablet  Commonly known as: CLARITIN Take 10 mg by mouth daily.   metFORMIN 1000 MG tablet Commonly known as: GLUCOPHAGE Take 1,000 mg by mouth daily.   nitroGLYCERIN 0.4 MG SL tablet Commonly known as: NITROSTAT Place 0.4 mg under the tongue every 5 (five) minutes as needed.   PreviDent 5000 Dry Mouth 1.1 % Gel dental gel Generic drug: sodium fluoride 2 (two) times daily. as directed   tamsulosin 0.4 MG Caps capsule Commonly known as: FLOMAX Take 0.8 mg by mouth daily.   Trospium Chloride 60 MG Cp24 Take 1 capsule (60 mg total) by mouth daily.       Past Medical History:  Diagnosis Date  . Carotid artery disease ([place]) 05/08/2012   CEA on left 2013  . Diabetes mellitus without complication ([place])   . Food allergy    Alpha Gal allergy   . Hyperlipidemia   . Hypertensive heart disease without CHF 04/08/2018  . Myocardial infarction ([place]) 1993   medically managed  . Obesity (BMI 30-39.9) 04/08/2018  . Prostate cancer ([place]) 08/07/2016  . PVC's (premature ventricular contractions) 04/08/2018  . Thrombocytopenia ([place])     Past Surgical History:  Procedure Laterality Date  . APPENDECTOMY    . CAROTID ENDARTERECTOMY Right 06-07-12   cea  . ENDARTERECTOMY  06/07/2012   Procedure: ENDARTERECTOMY CAROTID;  Surgeon: Angelia Mould, MD;  Location: [facility] OR;  Service: Vascular;  Laterality: Right;  Right Carotid Endarterectomy  with Patch Angioplasty  . TRANSPERINEAL IMPLANT OF RADIATION SEEDS W/ ULTRASOUND      Review of systems negative except as noted in HPI / PMHx or noted below:  Review of Systems  Constitutional: Negative.   HENT: Negative.   Eyes: Negative.   Respiratory: Negative.   Cardiovascular: Negative.   Gastrointestinal: Negative.   Genitourinary: Negative.   Musculoskeletal: Negative.   Skin: Negative.   Neurological: Negative.   Endo/Heme/Allergies: Negative.   Psychiatric/Behavioral: Negative.      Objective:   Vitals:   05/08/19 0852  BP: (!)  152/82  Pulse: 72  Resp: 12  Temp: 97.7 F (36.5 C)  SpO2: 96%   Height: 5\' 7"  (170.2 cm)  Weight: 206 lb 12.8 oz (93.8 kg)   Physical Exam Constitutional:      Appearance: He is not diaphoretic.  HENT:     Head: Normocephalic.     Right Ear: Tympanic membrane, ear canal and external ear normal.     Left Ear: Tympanic membrane, ear canal and external ear normal.     Nose: Nose normal. No mucosal edema or rhinorrhea.     Mouth/Throat:     Pharynx: Uvula midline. No oropharyngeal exudate.  Eyes:     Conjunctiva/sclera: Conjunctivae normal.  Neck:     Thyroid: No thyromegaly.     Trachea: Trachea normal. No tracheal tenderness or tracheal deviation.  Cardiovascular:     Rate and Rhythm: Normal rate and regular rhythm.     Heart sounds: Normal heart sounds, S1 normal and S2 normal. No murmur.  Pulmonary:     Effort: No respiratory distress.     Breath sounds: Normal breath sounds. No stridor. No wheezing or rales.  Lymphadenopathy:     Head:     Right side of head: No tonsillar adenopathy.     Left side of head: No tonsillar adenopathy.     Cervical: No cervical adenopathy.  Skin:    Findings: No erythema or rash.     Nails: There is no clubbing.   Neurological:     Mental Status: He is alert.     Diagnostics: none  Assessment and Plan:   1. Allergy with anaphylaxis due to food     1.  "Action Plan":   A. use EpiPen  B. take Benadryl 50 mg  C. take prednisone 20 mg single dose clinic samples  2. Obtain COVID vaccine when available  3. Check alpha gal panel this January with primary doctor  4. Return in 12 months or earlier if problem   Daniel Gibson is doing quite well by avoiding mammal meat consumption.  He has a strong desire to eat mammal meat and he would like to be checked for alpha gal antibody titers twice a year and we will have his primary care doctor handle that issue.  He can contact me should he develop significant problems in the face of utilizing the  plan noted above.  Allena Katz, MD Allergy / Immunology [place]

## 2019-05-12 ENCOUNTER — Encounter: Payer: Self-pay | Admitting: Allergy and Immunology

## 2019-05-28 ENCOUNTER — Ambulatory Visit (INDEPENDENT_AMBULATORY_CARE_PROVIDER_SITE_OTHER): Payer: Medicare HMO | Admitting: Cardiology

## 2019-05-28 ENCOUNTER — Other Ambulatory Visit: Payer: Self-pay

## 2019-05-28 ENCOUNTER — Encounter: Payer: Self-pay | Admitting: Cardiology

## 2019-05-28 VITALS — BP 130/80 | HR 64 | Ht 70.0 in | Wt 207.2 lb

## 2019-05-28 DIAGNOSIS — I779 Disorder of arteries and arterioles, unspecified: Secondary | ICD-10-CM

## 2019-05-28 DIAGNOSIS — I119 Hypertensive heart disease without heart failure: Secondary | ICD-10-CM | POA: Diagnosis not present

## 2019-05-28 DIAGNOSIS — E785 Hyperlipidemia, unspecified: Secondary | ICD-10-CM

## 2019-05-28 DIAGNOSIS — I25118 Atherosclerotic heart disease of native coronary artery with other forms of angina pectoris: Secondary | ICD-10-CM | POA: Diagnosis not present

## 2019-05-28 DIAGNOSIS — I493 Ventricular premature depolarization: Secondary | ICD-10-CM | POA: Diagnosis not present

## 2019-05-28 MED ORDER — NITROGLYCERIN 0.4 MG SL SUBL: 0.4000 mg | SUBLINGUAL_TABLET | SUBLINGUAL | 11 refills | Status: DC | PRN

## 2019-05-28 MED ORDER — EZETIMIBE 10 MG PO TABS: 10.0000 mg | ORAL_TABLET | Freq: Every day | ORAL | 2 refills | Status: DC

## 2019-05-28 NOTE — Progress Notes (Signed)
Cardiology Office Note:    Date:  05/28/2019   ID:  Daniel Gibson, DOB [birth year], MRN [id number]  PCP:  Cyndi Bender, PA-C  Cardiologist:  Shirlee More, MD    Referring MD: Cyndi Bender, PA-C  Please do a LP(a) level with his next lipid profile.   ASSESSMENT:    1. Coronary artery disease of native artery of native heart with stable angina pectoris ([place])   2. Hypertensive heart disease without CHF   3. PVC's (premature ventricular contractions)   4. Hyperlipidemia, unspecified hyperlipidemia type   5. Carotid artery disease, unspecified laterality, unspecified type ([employer])    PLAN:    In order of problems listed above:  1. Stable New York Heart Association class I having no angina on current medical treatment we will continue aspirin beta-blocker and combined lipid-lowering with statin and Zetia.  We discussed he is interested and I will see him in July and will schedule cardiac CTA for prognosis after immunization and control of COVID-19 2. Stable hypertension BP at target continue beta-blocker and calcium channel blocker 3. Stable none present on EKG asymptomatic continue beta-blocker 4. Stable continue combined therapy with premature CAD 5. Stable followed by vascular surgery   Next appointment: July 2020 to schedule cardiac CTA   Medication Adjustments/Labs and Tests Ordered: Current medicines are reviewed at length with the patient today.  Concerns regarding medicines are outlined above.  No orders of the defined types were placed in this encounter.  No orders of the defined types were placed in this encounter.   Chief Complaint  Patient presents with  . Follow-up    PVC's  . Coronary Artery Disease  . Hypertension  . Hyperlipidemia    History of Present Illness:    Daniel Gibson is a 68 y.o. male with a hx of CAD hypertension hyperlipidemia PVC's and carotid stenosis  last seen 05/14/2019.Coronary angiography showed mild CAD 60% proximal PDA 40% ramus  ejection fraction 52% by gated pool 02/13/2013 date of coronary angiography was 11/07/1991 other problems have included inferior wall myocardial infarction 1993 and carotid artery disease with right carotid artery endarterectomy December 2013.   Compliance with diet, lifestyle and medications: Yes  Has had a good year he tolerates his Metformin as well since combined lipid-lowering therapy with statin and Zetia his most recent lipid profile is improved cholesterol 132 LDL 77 HDL 36 with normal liver function test.  He has had no TIA chest pain shortness of breath palpitation or syncope and has not had been exposed to her develop COVID-19. Past Medical History:  Diagnosis Date  . Carotid artery disease ([place]) 05/08/2012   CEA on left 2013  . Diabetes mellitus without complication ([place])   . Food allergy    Alpha Gal allergy   . Hyperlipidemia   . Hypertensive heart disease without CHF 04/08/2018  . Myocardial infarction ([place]) 1993   medically managed  . Obesity (BMI 30-39.9) 04/08/2018  . Prostate cancer ([place]) 08/07/2016  . PVC's (premature ventricular contractions) 04/08/2018  . Thrombocytopenia ([place])     Past Surgical History:  Procedure Laterality Date  . APPENDECTOMY    . CAROTID ENDARTERECTOMY Right 06-07-12   cea  . ENDARTERECTOMY  06/07/2012   Procedure: ENDARTERECTOMY CAROTID;  Surgeon: Angelia Mould, MD;  Location: [facility] OR;  Service: Vascular;  Laterality: Right;  Right Carotid Endarterectomy with Patch Angioplasty  . TRANSPERINEAL IMPLANT OF RADIATION SEEDS W/ ULTRASOUND      Current Medications: Current Meds  Medication Sig  .  amLODipine (NORVASC) 10 MG tablet Take 10 mg by mouth daily.  [name] aspirin 81 MG tablet Take 81 mg by mouth daily.  [name] atorvastatin (LIPITOR) 40 MG tablet Take 1 tablet (40 mg total) by mouth daily. Patient uses this medication at bedtime.  . carvedilol (COREG) 25 MG tablet Take 12.5 mg by mouth 2 (two) times daily with a meal.  . EPINEPHrine  (EPIPEN 2-PAK) 0.3 mg/0.3 mL IJ SOAJ injection Use as directed for life-threatening allergic reaction.  [name] ezetimibe (ZETIA) 10 MG tablet Take 1 tablet (10 mg total) by mouth daily.  . fluticasone (FLONASE) 50 MCG/ACT nasal spray Place 2 sprays into both nostrils daily.  [name] loratadine (CLARITIN) 10 MG tablet Take 10 mg by mouth daily.   . metFORMIN (GLUCOPHAGE) 1000 MG tablet Take 500 mg by mouth 2 (two) times daily with a meal.   . nitroGLYCERIN (NITROSTAT) 0.4 MG SL tablet Place 0.4 mg under the tongue every 5 (five) minutes as needed.  [name] PREVIDENT 5000 DRY MOUTH 1.1 % GEL dental gel 2 (two) times daily. as directed  . Tamsulosin HCl (FLOMAX) 0.4 MG CAPS Take 0.8 mg by mouth daily.   . Trospium Chloride 60 MG CP24 Take 1 capsule (60 mg total) by mouth daily.     Allergies:   Ace inhibitors, Beef-derived products, Pork-derived products, Sheep-derived products, and Tetanus toxoids   Social History   Socioeconomic History  . Marital status: Married    Spouse name: Not on file  . Number of children: Not on file  . Years of education: Not on file  . Highest education level: Not on file  Occupational History  . Not on file  Social Needs  . Financial resource strain: Not on file  . Food insecurity    Worry: Not on file    Inability: Not on file  . Transportation needs    Medical: Not on file    Non-medical: Not on file  Tobacco Use  . Smoking status: Never Smoker  . Smokeless tobacco: Never Used  Substance and Sexual Activity  . Alcohol use: No  . Drug use: No  . Sexual activity: Not on file  Lifestyle  . Physical activity    Days per week: Not on file    Minutes per session: Not on file  . Stress: Not on file  Relationships  . Social Herbalist on phone: Not on file    Gets together: Not on file    Attends religious service: Not on file    Active member of club or organization: Not on file    Attends meetings of clubs or organizations: Not on file    Relationship  status: Not on file  Other Topics Concern  . Not on file  Social History Narrative  . Not on file     Family History: The patient's family history includes Cancer in his mother; Diabetes in his brother and sister; Heart attack in his father; Heart disease in his brother, father, and sister; Hyperlipidemia in his brother, father, mother, and sister; Hypertension in his brother, father, mother, and sister. ROS:   Please see the history of present illness.    All other systems reviewed and are negative.  EKGs/Labs/Other Studies Reviewed:    The following studies were reviewed today:  EKG:  EKG ordered today and personally reviewed.  The ekg ordered today demonstrates sinus rhythm nonspecific T waves    Carotid duplex 07/10/2018: Summary: Right Carotid: Velocities in the right  ICA are consistent with a 40-59%                stenosis.  Left Carotid: Velocities in the left ICA are consistent with a 40-59% stenosis.  Vertebrals:  Right vertebral artery demonstrates antegrade flow. Left vertebral              artery demonstrates high resistant flow. Subclavians: Left subclavian artery was stenotic. Normal flow hemodynamics were              seen in the right subclavian artery.  Recent Labs: No results found for requested labs within last 8760 hours.  Recent Lipid Panel No results found for: CHOL, TRIG, HDL, CHOLHDL, VLDL, LDLCALC, LDLDIRECT  Physical Exam:    VS:  BP 130/80 (BP Location: Right Arm, Patient Position: Sitting, Cuff Size: Normal)   Pulse 64   Ht 5\' 10"  (1.778 m)   Wt 207 lb 3.2 oz (94 kg)   SpO2 98%   BMI 29.73 kg/m     Wt Readings from Last 3 Encounters:  05/28/19 207 lb 3.2 oz (94 kg)  05/08/19 206 lb 12.8 oz (93.8 kg)  07/10/18 205 lb (93 kg)     GEN:  Well nourished, well developed in no acute distress HEENT: Normal NECK: No JVD; No carotid bruits LYMPHATICS: No lymphadenopathy CARDIAC: RRR, no murmurs, rubs, gallops RESPIRATORY:  Clear to  auscultation without rales, wheezing or rhonchi  ABDOMEN: Soft, non-tender, non-distended MUSCULOSKELETAL:  No edema; No deformity  SKIN: Warm and dry NEUROLOGIC:  Alert and oriented x 3 PSYCHIATRIC:  Normal affect    Signed, Shirlee More, MD  05/28/2019 9:21 AM    [place]

## 2019-05-28 NOTE — Addendum Note (Signed)
Addended by: Stevan Born on: 05/28/2019 09:29 AM   Modules accepted: Orders

## 2019-05-28 NOTE — Patient Instructions (Signed)
Medication Instructions:  Your physician recommends that you continue on your current medications as directed. Please refer to the Current Medication list given to you today.  *If you need a refill on your cardiac medications before your next appointment, please call your pharmacy*  Lab Work: NONE If you have labs (blood work) drawn today and your tests are completely normal, you will receive your results only by: [name] MyChart Message (if you have MyChart) OR . A paper copy in the mail If you have any lab test that is abnormal or we need to change your treatment, we will call you to review the results.  Testing/Procedures: You had an EKG today.  Follow-Up: At [facility], you and your health needs are our priority.  As part of our continuing mission to provide you with exceptional heart care, we have created designated Provider Care Teams.  These Care Teams include your primary Cardiologist (physician) and Advanced Practice Providers (APPs -  Physician Assistants and Nurse Practitioners) who all work together to provide you with the care you need, when you need it.  Your next appointment:   6 month(s)  The format for your next appointment:   In Person  Provider:   Shirlee More, MD

## 2019-07-21 DIAGNOSIS — T783XXA Angioneurotic edema, initial encounter: Secondary | ICD-10-CM | POA: Diagnosis not present

## 2019-07-21 DIAGNOSIS — E1129 Type 2 diabetes mellitus with other diabetic kidney complication: Secondary | ICD-10-CM | POA: Diagnosis not present

## 2019-07-21 DIAGNOSIS — I2510 Atherosclerotic heart disease of native coronary artery without angina pectoris: Secondary | ICD-10-CM | POA: Diagnosis not present

## 2019-07-21 DIAGNOSIS — I10 Essential (primary) hypertension: Secondary | ICD-10-CM | POA: Diagnosis not present

## 2019-07-23 DIAGNOSIS — Z6831 Body mass index (BMI) 31.0-31.9, adult: Secondary | ICD-10-CM | POA: Diagnosis not present

## 2019-07-23 DIAGNOSIS — I6529 Occlusion and stenosis of unspecified carotid artery: Secondary | ICD-10-CM | POA: Diagnosis not present

## 2019-07-23 DIAGNOSIS — I2510 Atherosclerotic heart disease of native coronary artery without angina pectoris: Secondary | ICD-10-CM | POA: Diagnosis not present

## 2019-07-23 DIAGNOSIS — Z139 Encounter for screening, unspecified: Secondary | ICD-10-CM | POA: Diagnosis not present

## 2019-07-23 DIAGNOSIS — E789 Disorder of lipoprotein metabolism, unspecified: Secondary | ICD-10-CM | POA: Diagnosis not present

## 2019-07-23 DIAGNOSIS — E1129 Type 2 diabetes mellitus with other diabetic kidney complication: Secondary | ICD-10-CM | POA: Diagnosis not present

## 2019-07-23 DIAGNOSIS — I10 Essential (primary) hypertension: Secondary | ICD-10-CM | POA: Diagnosis not present

## 2019-10-02 ENCOUNTER — Other Ambulatory Visit: Payer: Self-pay | Admitting: *Deleted

## 2019-10-02 DIAGNOSIS — I6523 Occlusion and stenosis of bilateral carotid arteries: Secondary | ICD-10-CM

## 2019-10-08 ENCOUNTER — Other Ambulatory Visit: Payer: Self-pay

## 2019-10-08 ENCOUNTER — Encounter: Payer: Self-pay | Admitting: Vascular Surgery

## 2019-10-08 ENCOUNTER — Ambulatory Visit (HOSPITAL_COMMUNITY)
Admission: RE | Admit: 2019-10-08 | Discharge: 2019-10-08 | Disposition: A | Discharge status: Home / Self Care | Payer: Medicare HMO | Source: Ambulatory Visit | Attending: Vascular Surgery | Admitting: Vascular Surgery

## 2019-10-08 ENCOUNTER — Ambulatory Visit (INDEPENDENT_AMBULATORY_CARE_PROVIDER_SITE_OTHER): Payer: Medicare HMO | Admitting: Vascular Surgery

## 2019-10-08 VITALS — BP 180/83 | HR 69 | Resp 20 | Ht 70.0 in | Wt 211.0 lb

## 2019-10-08 DIAGNOSIS — I6523 Occlusion and stenosis of bilateral carotid arteries: Secondary | ICD-10-CM | POA: Diagnosis not present

## 2019-10-08 NOTE — Progress Notes (Signed)
Patient name: Daniel Gibson MRN: [id number] DOB: 01/19/1951 Sex: male  REASON FOR VISIT:   Follow-up of bilateral carotid disease.  HPI:   Daniel Gibson is a pleasant 69 y.o. male who underwent a right carotid endarterectomy in 2013.  He developed a mild recurrent stenosis of the right in the 40 to 59% range.  We are also following a moderate left carotid stenosis that was in the 40 to 59% range.  He comes in for 1 year follow-up visit.  Since I saw him last he denies any history of stroke, TIAs, expressive or receptive aphasia, or amaurosis fugax.  He is on aspirin and is on a statin.  He is not a smoker.  Past Medical History:  Diagnosis Date  . Carotid artery disease ([place]) 05/08/2012   CEA on left 2013  . Diabetes mellitus without complication ([place])   . Food allergy    Alpha Gal allergy   . Hyperlipidemia   . Hypertensive heart disease without CHF 04/08/2018  . Myocardial infarction ([place]) 1993   medically managed  . Obesity (BMI 30-39.9) 04/08/2018  . Prostate cancer ([place]) 08/07/2016  . PVC's (premature ventricular contractions) 04/08/2018  . Thrombocytopenia ([place])     Family History  Problem Relation Age of Onset  . Cancer Mother   . Hyperlipidemia Mother   . Hypertension Mother   . Heart attack Father   . Heart disease Father        Heart Disease before age 82  . Hyperlipidemia Father   . Hypertension Father   . Diabetes Sister   . Heart disease Sister        before age 56  . Hypertension Sister   . Hyperlipidemia Sister   . Diabetes Brother   . Heart disease Brother        before age 72  . Hyperlipidemia Brother   . Hypertension Brother     SOCIAL HISTORY: Social History   Tobacco Use  . Smoking status: Never Smoker  . Smokeless tobacco: Never Used  Substance Use Topics  . Alcohol use: No    Allergies  Allergen Reactions  . Ace Inhibitors Swelling  . Beef-Derived Products Anaphylaxis    Alpha-Gal allergy  . Pork-Derived Products  Anaphylaxis    Alpha-Gal allergy  . Sheep-Derived Products Anaphylaxis    Alpha-Gal allergy  . Tetanus Toxoids Swelling    Current Outpatient Medications  Medication Sig Dispense Refill  . amLODipine (NORVASC) 10 MG tablet Take 10 mg by mouth daily.    [name] aspirin 81 MG tablet Take 81 mg by mouth daily.    [name] atorvastatin (LIPITOR) 40 MG tablet Take 1 tablet (40 mg total) by mouth daily. Patient uses this medication at bedtime. 90 tablet 3  . carvedilol (COREG) 25 MG tablet Take 12.5 mg by mouth 2 (two) times daily with a meal.    . EPINEPHrine (EPIPEN 2-PAK) 0.3 mg/0.3 mL IJ SOAJ injection Use as directed for life-threatening allergic reaction. 2 each 3  . ezetimibe (ZETIA) 10 MG tablet Take 1 tablet (10 mg total) by mouth daily. 90 tablet 2  . fluticasone (FLONASE) 50 MCG/ACT nasal spray Place 2 sprays into both nostrils daily.    [name] loratadine (CLARITIN) 10 MG tablet Take 10 mg by mouth daily.     . metFORMIN (GLUCOPHAGE) 1000 MG tablet Take 500 mg by mouth 2 (two) times daily with a meal.     . nitroGLYCERIN (NITROSTAT) 0.4 MG SL tablet Place 1 tablet (0.4 mg total)  under the tongue every 5 (five) minutes as needed. 25 tablet 11  . PREVIDENT 5000 DRY MOUTH 1.1 % GEL dental gel 2 (two) times daily. as directed    . Tamsulosin HCl (FLOMAX) 0.4 MG CAPS Take 0.8 mg by mouth daily.     . Trospium Chloride 60 MG CP24 Take 1 capsule (60 mg total) by mouth daily. 90 capsule 0   No current facility-administered medications for this visit.    REVIEW OF SYSTEMS:  [X]  denotes positive finding, [ ]  denotes negative finding Cardiac  Comments:  Chest pain or chest pressure:    Shortness of breath upon exertion:    Short of breath when lying flat:    Irregular heart rhythm:        Vascular    Pain in calf, thigh, or hip brought on by ambulation:    Pain in feet at night that wakes you up from your sleep:     Blood clot in your veins:    Leg swelling:         Pulmonary    Oxygen at home:      Productive cough:     Wheezing:         Neurologic    Sudden weakness in arms or legs:     Sudden numbness in arms or legs:     Sudden onset of difficulty speaking or slurred speech:    Temporary loss of vision in one eye:     Problems with dizziness:         Gastrointestinal    Blood in stool:     Vomited blood:         Genitourinary    Burning when urinating:     Blood in urine:        Psychiatric    Major depression:         Hematologic    Bleeding problems:    Problems with blood clotting too easily:        Skin    Rashes or ulcers:        Constitutional    Fever or chills:     PHYSICAL EXAM:   Vitals:   10/08/19 0848 10/08/19 0850  BP: (!) 178/98 (!) 180/83  Pulse: 69   Resp: 20   SpO2: 96%   Weight: 211 lb (95.7 kg)   Height: 5\' 10"  (1.778 m)     GENERAL: The patient is a well-nourished male, in no acute distress. The vital signs are documented above. CARDIAC: There is a regular rate and rhythm.  VASCULAR: I do not detect carotid bruits. On the right side he has a biphasic dorsalis pedis and posterior tibial signal with the Doppler. On the left side he has a palpable dorsalis pedis pulse.  He has a biphasic dorsalis pedis and posterior tibial signal with the Doppler. He has mild bilateral lower extremity swelling. PULMONARY: There is good air exchange bilaterally without wheezing or rales. ABDOMEN: Soft and non-tender with normal pitched bowel sounds.  MUSCULOSKELETAL: There are no major deformities or cyanosis. NEUROLOGIC: No focal weakness or paresthesias are detected. SKIN: There are no ulcers or rashes noted. PSYCHIATRIC: The patient has a normal affect.  DATA:    CAROTID DUPLEX: I have independently interpreted his carotid duplex scan today.  On the right side he has a 40 to 59% carotid stenosis which is stable.  This is in the lower end of that range.  The right vertebral artery is patent with antegrade  flow.  On the left side he has a 40 to  59% carotid stenosis.  This too is stable.  MEDICAL ISSUES:   BILATERAL CAROTID DISEASE: This patient has a known moderate recurrent right carotid stenosis in the 40 to 59% range which is stable.  He also has a 40 to 59% left carotid stenosis.  He is asymptomatic.  He understands we would not consider carotid endarterectomy less the stenosis progressed to greater than 80%.  He is not a smoker.  He is on aspirin and is on a statin.  I have ordered a follow-up carotid duplex scan in 1 year and I will see him back at that time.  He knows to call sooner if he has problems.  Deitra Mayo [facility] [phone]

## 2019-10-09 ENCOUNTER — Other Ambulatory Visit: Payer: Self-pay | Admitting: *Deleted

## 2019-10-09 DIAGNOSIS — I6523 Occlusion and stenosis of bilateral carotid arteries: Secondary | ICD-10-CM

## 2019-11-28 DIAGNOSIS — R69 Illness, unspecified: Secondary | ICD-10-CM | POA: Diagnosis not present

## 2019-12-30 DIAGNOSIS — R69 Illness, unspecified: Secondary | ICD-10-CM | POA: Diagnosis not present

## 2020-01-09 DIAGNOSIS — R69 Illness, unspecified: Secondary | ICD-10-CM | POA: Diagnosis not present

## 2020-01-20 DIAGNOSIS — I2510 Atherosclerotic heart disease of native coronary artery without angina pectoris: Secondary | ICD-10-CM | POA: Diagnosis not present

## 2020-01-20 DIAGNOSIS — E1129 Type 2 diabetes mellitus with other diabetic kidney complication: Secondary | ICD-10-CM | POA: Diagnosis not present

## 2020-01-20 DIAGNOSIS — I10 Essential (primary) hypertension: Secondary | ICD-10-CM | POA: Diagnosis not present

## 2020-01-20 DIAGNOSIS — T783XXA Angioneurotic edema, initial encounter: Secondary | ICD-10-CM | POA: Diagnosis not present

## 2020-01-22 DIAGNOSIS — Z6832 Body mass index (BMI) 32.0-32.9, adult: Secondary | ICD-10-CM | POA: Diagnosis not present

## 2020-01-22 DIAGNOSIS — E1129 Type 2 diabetes mellitus with other diabetic kidney complication: Secondary | ICD-10-CM | POA: Diagnosis not present

## 2020-01-22 DIAGNOSIS — I6529 Occlusion and stenosis of unspecified carotid artery: Secondary | ICD-10-CM | POA: Diagnosis not present

## 2020-01-22 DIAGNOSIS — I2510 Atherosclerotic heart disease of native coronary artery without angina pectoris: Secondary | ICD-10-CM | POA: Diagnosis not present

## 2020-01-22 DIAGNOSIS — I10 Essential (primary) hypertension: Secondary | ICD-10-CM | POA: Diagnosis not present

## 2020-01-22 DIAGNOSIS — E782 Mixed hyperlipidemia: Secondary | ICD-10-CM | POA: Diagnosis not present

## 2020-02-13 DIAGNOSIS — D696 Thrombocytopenia, unspecified: Secondary | ICD-10-CM | POA: Insufficient documentation

## 2020-02-13 DIAGNOSIS — E119 Type 2 diabetes mellitus without complications: Secondary | ICD-10-CM | POA: Insufficient documentation

## 2020-02-13 DIAGNOSIS — Z91018 Allergy to other foods: Secondary | ICD-10-CM | POA: Insufficient documentation

## 2020-02-16 NOTE — Progress Notes (Signed)
Cardiology Office Note:    Date:  02/17/2020   ID:  Selig Wampole, DOB [birth year], MRN [id number]  PCP:  Cyndi Bender, PA-C  Cardiologist:  Shirlee More, MD    Referring MD: Cyndi Bender, PA-C    ASSESSMENT:    1. Coronary artery disease of native artery of native heart with stable angina pectoris ([place])   2. Hypertensive heart disease without CHF   3. Hyperlipidemia, unspecified hyperlipidemia type   4. PVC's (premature ventricular contractions)    PLAN:    In order of problems listed above:  1. Greatest concern today is noncompliance with medications he assures me he will restart all of them today monitor home blood pressure and contact me or his PCP if systolics remain greater than 425 diastolics greater than 90.  He has arrangements for follow-up labs for A1c and lipids in 6 weeks.  In his high risk group his LDL should be less than 70 and ideally should be less than 55 and will continue combined atorvastatin and Zetia 2. Poorly controlled restart cardiac medications and antihypertensives Recheck labs 6 weeks PCP Recheck office EKG having no palpitation Poorly controlled type 2 diabetes managed by his PCP   Next appointment: Usually see him once a year however I had like to see him back in 6 months or sooner if he is having cardiovascular symptoms I advised him undergo cardiac CTA he will consider and speak with his son who is a radiology technician in the room and I will await his response.  He voiced understanding of the need to take medications we discussed strategies including a pillbox and reminders on his phone.   Medication Adjustments/Labs and Tests Ordered: Current medicines are reviewed at length with the patient today.  Concerns regarding medicines are outlined above.  No orders of the defined types were placed in this encounter.  No orders of the defined types were placed in this encounter.   Chief Complaint  Patient presents with  . Follow-up  . Coronary  Artery Disease    History of Present Illness:    Daniel Gibson is a 69 y.o. male with a hx of CAD hypertension hyperlipidemia PVC's and carotid stenosis  last seen 05/14/2019.Coronary angiography showed mild CAD 60% proximal PDA 40% ramus ejection fraction 52% by gated pool 02/13/2013 date of coronary angiography was 11/07/1991 other problems have included inferior wall myocardial infarction 1993 and carotid artery disease with right carotid artery endarterectomy December 2013.   He was last seen 05/27/20. Compliance with diet, lifestyle and medications: Yes  Sober with disturbing visit for about a month is not taking his cardiac medications.  He feels a bit overwhelmed with work responsibilities.  We discussed the negative impact of untreated hypertension hyperlipidemia and a man with a history of CAD and carotid disease he assures me he will restart his medicines.  Fortunately no angina dyspnea palpitations syncope or TIA.  We discussed a cardiac CTA a year ago he wants to speak to his son is a radiology tech and I hope he gets back to me and we will schedule him with his premature CAD he has arrangements to repeat his labs in 6 weeks and his primary care physician.  His blood pressure today is quite elevated rechecked by me was 168/104.  Recent labs 01/20/2000 shows cholesterol 156 LDL 102 triglycerides 81 HDL 38 A1c is above target 7.9%. Past Medical History:  Diagnosis Date  . Carotid artery disease ([place]) 05/08/2012   CEA on left 2013  .  Diabetes mellitus without complication ([place])   . Food allergy    Alpha Gal allergy   . Hyperlipidemia   . Hypertensive heart disease without CHF 04/08/2018  . Myocardial infarction ([place]) 1993   medically managed  . Obesity (BMI 30-39.9) 04/08/2018  . Prostate cancer ([place]) 08/07/2016  . PVC's (premature ventricular contractions) 04/08/2018  . Thrombocytopenia ([place])     Past Surgical History:  Procedure Laterality Date  . APPENDECTOMY    . CAROTID  ENDARTERECTOMY Right 06-07-12   cea  . ENDARTERECTOMY  06/07/2012   Procedure: ENDARTERECTOMY CAROTID;  Surgeon: Angelia Mould, MD;  Location: [facility] OR;  Service: Vascular;  Laterality: Right;  Right Carotid Endarterectomy with Patch Angioplasty  . TRANSPERINEAL IMPLANT OF RADIATION SEEDS W/ ULTRASOUND      Current Medications: Current Meds  Medication Sig  . amLODipine (NORVASC) 10 MG tablet Take 10 mg by mouth daily.  [name] aspirin 81 MG tablet Take 81 mg by mouth daily.  [name] atorvastatin (LIPITOR) 40 MG tablet Take 1 tablet (40 mg total) by mouth daily. Patient uses this medication at bedtime.  . carvedilol (COREG) 25 MG tablet Take 12.5 mg by mouth 2 (two) times daily with a meal.  . EPINEPHrine (EPIPEN 2-PAK) 0.3 mg/0.3 mL IJ SOAJ injection Use as directed for life-threatening allergic reaction.  [name] ezetimibe (ZETIA) 10 MG tablet Take 1 tablet (10 mg total) by mouth daily.  . fluticasone (FLONASE) 50 MCG/ACT nasal spray Place 2 sprays into both nostrils daily.  [name] loratadine (CLARITIN) 10 MG tablet Take 10 mg by mouth daily.   . metFORMIN (GLUCOPHAGE) 1000 MG tablet Take 500 mg by mouth 2 (two) times daily with a meal.   . nitroGLYCERIN (NITROSTAT) 0.4 MG SL tablet Place 1 tablet (0.4 mg total) under the tongue every 5 (five) minutes as needed.  [name] PREVIDENT 5000 DRY MOUTH 1.1 % GEL dental gel 2 (two) times daily. as directed  . ranitidine (ZANTAC) 150 MG capsule Take 150 mg by mouth daily.  . Tamsulosin HCl (FLOMAX) 0.4 MG CAPS Take 0.8 mg by mouth daily.   . Trospium Chloride 60 MG CP24 Take 1 capsule (60 mg total) by mouth daily.     Allergies:   Ace inhibitors, Beef-derived products, Pork-derived products, Sheep-derived products, and Tetanus toxoids   Social History   Socioeconomic History  . Marital status: Married    Spouse name: Not on file  . Number of children: Not on file  . Years of education: Not on file  . Highest education level: Not on file  Occupational History    . Not on file  Tobacco Use  . Smoking status: Never Smoker  . Smokeless tobacco: Never Used  Vaping Use  . Vaping Use: Never used  Substance and Sexual Activity  . Alcohol use: No  . Drug use: No  . Sexual activity: Not on file  Other Topics Concern  . Not on file  Social History Narrative  . Not on file   Social Determinants of Health   Financial Resource Strain:   . Difficulty of Paying Living Expenses: Not on file  Food Insecurity:   . Worried About Charity fundraiser in the Last Year: Not on file  . Ran Out of Food in the Last Year: Not on file  Transportation Needs:   . Lack of Transportation (Medical): Not on file  . Lack of Transportation (Non-Medical): Not on file  Physical Activity:   . Days of Exercise per Week: Not  on file  . Minutes of Exercise per Session: Not on file  Stress:   . Feeling of Stress : Not on file  Social Connections:   . Frequency of Communication with Friends and Family: Not on file  . Frequency of Social Gatherings with Friends and Family: Not on file  . Attends Religious Services: Not on file  . Active Member of Clubs or Organizations: Not on file  . Attends Archivist Meetings: Not on file  . Marital Status: Not on file     Family History: The patient's family history includes Cancer in his mother; Diabetes in his brother and sister; Heart attack in his father; Heart disease in his brother, father, and sister; Hyperlipidemia in his brother, father, mother, and sister; Hypertension in his brother, father, mother, and sister. ROS:   Please see the history of present illness.    All other systems reviewed and are negative.  EKGs/Labs/Other Studies Reviewed:    The following studies were reviewed today:  EKG:  EKG ordered today and personally reviewed.  The ekg ordered today demonstrates sinus rhythm normal EKG  Recent Labs: No results found for requested labs within last 8760 hours.  Recent Lipid Panel No results found  for: CHOL, TRIG, HDL, CHOLHDL, VLDL, LDLCALC, LDLDIRECT  Physical Exam:    VS:  BP (!) 184/92   Pulse 76   Ht 5\' 10"  (1.778 m)   Wt 211 lb (95.7 kg)   SpO2 93%   BMI 30.28 kg/m     Wt Readings from Last 3 Encounters:  02/17/20 211 lb (95.7 kg)  10/08/19 211 lb (95.7 kg)  05/28/19 207 lb 3.2 oz (94 kg)     GEN:  Well nourished, well developed in no acute distress HEENT: Normal NECK: No JVD; No carotid bruits LYMPHATICS: No lymphadenopathy CARDIAC: RRR, no murmurs, rubs, gallops RESPIRATORY:  Clear to auscultation without rales, wheezing or rhonchi  ABDOMEN: Soft, non-tender, non-distended MUSCULOSKELETAL:  No edema; No deformity  SKIN: Warm and dry NEUROLOGIC:  Alert and oriented x 3 PSYCHIATRIC:  Normal affect    Signed, Shirlee More, MD  02/17/2020 8:32 AM    [facility]

## 2020-02-17 ENCOUNTER — Other Ambulatory Visit: Payer: Self-pay | Admitting: Cardiology

## 2020-02-17 ENCOUNTER — Encounter: Payer: Self-pay | Admitting: Cardiology

## 2020-02-17 ENCOUNTER — Other Ambulatory Visit: Payer: Self-pay

## 2020-02-17 ENCOUNTER — Ambulatory Visit: Payer: Medicare HMO | Admitting: Cardiology

## 2020-02-17 VITALS — BP 184/92 | HR 76 | Ht 70.0 in | Wt 211.0 lb

## 2020-02-17 DIAGNOSIS — I119 Hypertensive heart disease without heart failure: Secondary | ICD-10-CM

## 2020-02-17 DIAGNOSIS — E785 Hyperlipidemia, unspecified: Secondary | ICD-10-CM | POA: Diagnosis not present

## 2020-02-17 DIAGNOSIS — I493 Ventricular premature depolarization: Secondary | ICD-10-CM

## 2020-02-17 DIAGNOSIS — I25118 Atherosclerotic heart disease of native coronary artery with other forms of angina pectoris: Secondary | ICD-10-CM

## 2020-02-17 NOTE — Patient Instructions (Addendum)
Medication Instructions:  Your physician recommends that you continue on your current medications as directed. Please refer to the Current Medication list given to you today.  *If you need a refill on your cardiac medications before your next appointment, please call your pharmacy*   Lab Work: None If you have labs (blood work) drawn today and your tests are completely normal, you will receive your results only by: [name] MyChart Message (if you have MyChart) OR . A paper copy in the mail If you have any lab test that is abnormal or we need to change your treatment, we will call you to review the results.   Testing/Procedures: None   Follow-Up: At [facility], you and your health needs are our priority.  As part of our continuing mission to provide you with exceptional heart care, we have created designated Provider Care Teams.  These Care Teams include your primary Cardiologist (physician) and Advanced Practice Providers (APPs -  Physician Assistants and Nurse Practitioners) who all work together to provide you with the care you need, when you need it.  We recommend signing up for the patient portal called "MyChart".  Sign up information is provided on this After Visit Summary.  MyChart is used to connect with patients for Virtual Visits (Telemedicine).  Patients are able to view lab/test results, encounter notes, upcoming appointments, etc.  Non-urgent messages can be sent to your provider as well.   To learn more about what you can do with MyChart, go to [email or web address].    Your next appointment:   6 month(s)  The format for your next appointment:   In Person  Provider:   Shirlee More, MD   Other Instructions We have recommended that you have a cardiac CTA.  Please call if your home blood pressure is consistently above 140/90.

## 2020-03-29 DIAGNOSIS — L821 Other seborrheic keratosis: Secondary | ICD-10-CM | POA: Diagnosis not present

## 2020-03-29 DIAGNOSIS — L820 Inflamed seborrheic keratosis: Secondary | ICD-10-CM | POA: Diagnosis not present

## 2020-03-29 DIAGNOSIS — D2371 Other benign neoplasm of skin of right lower limb, including hip: Secondary | ICD-10-CM | POA: Diagnosis not present

## 2020-03-29 DIAGNOSIS — L814 Other melanin hyperpigmentation: Secondary | ICD-10-CM | POA: Diagnosis not present

## 2020-03-29 DIAGNOSIS — D225 Melanocytic nevi of trunk: Secondary | ICD-10-CM | POA: Diagnosis not present

## 2020-03-30 DIAGNOSIS — R69 Illness, unspecified: Secondary | ICD-10-CM | POA: Diagnosis not present

## 2020-05-10 ENCOUNTER — Ambulatory Visit (INDEPENDENT_AMBULATORY_CARE_PROVIDER_SITE_OTHER): Payer: Medicare HMO | Admitting: Allergy and Immunology

## 2020-05-10 ENCOUNTER — Other Ambulatory Visit: Payer: Self-pay

## 2020-05-10 ENCOUNTER — Encounter: Payer: Self-pay | Admitting: Allergy and Immunology

## 2020-05-10 VITALS — BP 118/68 | HR 65 | Resp 16

## 2020-05-10 DIAGNOSIS — T7800XD Anaphylactic reaction due to unspecified food, subsequent encounter: Secondary | ICD-10-CM | POA: Diagnosis not present

## 2020-05-10 DIAGNOSIS — T7800XA Anaphylactic reaction due to unspecified food, initial encounter: Secondary | ICD-10-CM

## 2020-05-10 MED ORDER — EPINEPHRINE 0.3 MG/0.3ML IJ SOAJ: INTRAMUSCULAR | 3 refills | Status: AC

## 2020-05-10 NOTE — Patient Instructions (Signed)
  1.  "Action Plan":   A. use EpiPen  B. take Benadryl 50 mg  C. take prednisone 20 mg single dose clinic samples

## 2020-05-10 NOTE — Progress Notes (Signed)
[place]   Follow-up Note  Referring Provider: Cyndi Bender, PA-C Primary Provider: Cyndi Bender, Hershal Coria Date of Office Visit: 05/10/2020  Subjective:   Daniel Gibson (DOB: 01-04-1951) is a 69 y.o. male who returns to the [facility] on 05/10/2020 in re-evaluation of the following:  HPI: Daniel Gibson returns to this clinic in evaluation of alpha gal syndrome.  I have not seen him in this clinic since 08 May 2019.  While remaining away from mammal consumption he has not had any allergic reactions.  He no longer uses an H1 or H2 receptor blocker.  He does have an injectable epinephrine device.  He has received 3 Moderna Covid vaccines and the flu vaccine this year.  Allergies as of 05/10/2020      Reactions   Ace Inhibitors Swelling   Beef-derived Products Anaphylaxis   Alpha-Gal allergy   Pork-derived Products Anaphylaxis   Alpha-Gal allergy   Sheep-derived Products Anaphylaxis   Alpha-Gal allergy   Tetanus Toxoids Swelling      Medication List    amLODipine 10 MG tablet Commonly known as: NORVASC Take 10 mg by mouth daily.   aspirin 81 MG tablet Take 81 mg by mouth daily.   atorvastatin 40 MG tablet Commonly known as: LIPITOR Take 1 tablet (40 mg total) by mouth daily. Patient uses this medication at bedtime.   carvedilol 25 MG tablet Commonly known as: COREG Take 12.5 mg by mouth 2 (two) times daily with a meal.   EPINEPHrine 0.3 mg/0.3 mL Soaj injection Commonly known as: EpiPen 2-Pak Use as directed for life-threatening allergic reaction.   ezetimibe 10 MG tablet Commonly known as: ZETIA TAKE 1 TABLET BY MOUTH EVERY DAY   fluticasone 50 MCG/ACT nasal spray Commonly known as: FLONASE Place 2 sprays into both nostrils daily.   metFORMIN 1000 MG tablet Commonly known as: GLUCOPHAGE Take 500 mg by mouth 2 (two) times daily with a meal.   nitroGLYCERIN 0.4 MG SL tablet Commonly known as:  NITROSTAT Place 1 tablet (0.4 mg total) under the tongue every 5 (five) minutes as needed.   PreviDent 5000 Dry Mouth 1.1 % Gel dental gel Generic drug: sodium fluoride 2 (two) times daily. as directed   tamsulosin 0.4 MG Caps capsule Commonly known as: FLOMAX Take 0.8 mg by mouth daily.   Trospium Chloride 60 MG Cp24 Take 1 capsule (60 mg total) by mouth daily.       Past Medical History:  Diagnosis Date  . Carotid artery disease ([place]) 05/08/2012   CEA on left 2013  . Diabetes mellitus without complication ([place])   . Food allergy    Alpha Gal allergy   . Hyperlipidemia   . Hypertensive heart disease without CHF 04/08/2018  . Myocardial infarction ([place]) 1993   medically managed  . Obesity (BMI 30-39.9) 04/08/2018  . Prostate cancer ([place]) 08/07/2016  . PVC's (premature ventricular contractions) 04/08/2018  . Thrombocytopenia ([place])     Past Surgical History:  Procedure Laterality Date  . APPENDECTOMY    . CAROTID ENDARTERECTOMY Right 06-07-12   cea  . ENDARTERECTOMY  06/07/2012   Procedure: ENDARTERECTOMY CAROTID;  Surgeon: Angelia Mould, MD;  Location: [facility] OR;  Service: Vascular;  Laterality: Right;  Right Carotid Endarterectomy with Patch Angioplasty  . TRANSPERINEAL IMPLANT OF RADIATION SEEDS W/ ULTRASOUND      Review of systems negative except as noted in HPI / PMHx or noted below:  Review of Systems  Constitutional: Negative.   HENT: Negative.   Eyes: Negative.   Respiratory: Negative.   Cardiovascular: Negative.   Gastrointestinal: Negative.   Genitourinary: Negative.   Musculoskeletal: Negative.   Skin: Negative.   Neurological: Negative.   Endo/Heme/Allergies: Negative.   Psychiatric/Behavioral: Negative.      Objective:   Vitals:   05/10/20 0842  BP: 118/68  Pulse: 65  Resp: 16  SpO2: 96%          Physical Exam Constitutional:      Appearance: He is not diaphoretic.  HENT:     Head: Normocephalic.     Right Ear: Tympanic  membrane, ear canal and external ear normal.     Left Ear: Tympanic membrane, ear canal and external ear normal.     Nose: Nose normal. No mucosal edema or rhinorrhea.     Mouth/Throat:     Pharynx: Uvula midline. No oropharyngeal exudate.  Eyes:     Conjunctiva/sclera: Conjunctivae normal.  Neck:     Thyroid: No thyromegaly.     Trachea: Trachea normal. No tracheal tenderness or tracheal deviation.  Cardiovascular:     Rate and Rhythm: Normal rate and regular rhythm.     Heart sounds: Normal heart sounds, S1 normal and S2 normal. No murmur heard.   Pulmonary:     Effort: No respiratory distress.     Breath sounds: Normal breath sounds. No stridor. No wheezing or rales.  Lymphadenopathy:     Head:     Right side of head: No tonsillar adenopathy.     Left side of head: No tonsillar adenopathy.     Cervical: No cervical adenopathy.  Skin:    Findings: No erythema or rash.     Nails: There is no clubbing.  Neurological:     Mental Status: He is alert.     Diagnostics:    Review of blood tests obtained 20 January 2020 identifies alpha gal IgE 1.04 KU/L.  Assessment and Plan:   1. Allergy with anaphylaxis due to food     1.  "Action Plan":   A. use EpiPen  B. take Benadryl 50 mg  C. take prednisone 20 mg single dose clinic samples  Daniel Gibson is doing wonderful as long as he remains away from mammal consumption.  I informed him today that there is no reason for him to return to this clinic for an annual evaluation as long as he can obtain his prescription for an injectable epinephrine device with his primary care doctor.  I will be very happy to see him back in his clinic should he develop any problems in the face of this plan.  Allena Katz, MD Allergy / Immunology [place]

## 2020-05-11 ENCOUNTER — Encounter: Payer: Self-pay | Admitting: Allergy and Immunology

## 2020-06-11 ENCOUNTER — Encounter: Payer: Self-pay | Admitting: *Deleted

## 2020-07-08 DIAGNOSIS — C61 Malignant neoplasm of prostate: Secondary | ICD-10-CM | POA: Diagnosis not present

## 2020-07-15 DIAGNOSIS — N5201 Erectile dysfunction due to arterial insufficiency: Secondary | ICD-10-CM | POA: Diagnosis not present

## 2020-07-15 DIAGNOSIS — R3915 Urgency of urination: Secondary | ICD-10-CM | POA: Diagnosis not present

## 2020-07-15 DIAGNOSIS — C61 Malignant neoplasm of prostate: Secondary | ICD-10-CM | POA: Diagnosis not present

## 2020-07-26 DIAGNOSIS — I2510 Atherosclerotic heart disease of native coronary artery without angina pectoris: Secondary | ICD-10-CM | POA: Diagnosis not present

## 2020-07-26 DIAGNOSIS — I10 Essential (primary) hypertension: Secondary | ICD-10-CM | POA: Diagnosis not present

## 2020-07-26 DIAGNOSIS — E1129 Type 2 diabetes mellitus with other diabetic kidney complication: Secondary | ICD-10-CM | POA: Diagnosis not present

## 2020-07-26 DIAGNOSIS — T783XXA Angioneurotic edema, initial encounter: Secondary | ICD-10-CM | POA: Diagnosis not present

## 2020-07-28 DIAGNOSIS — Z9181 History of falling: Secondary | ICD-10-CM | POA: Diagnosis not present

## 2020-07-28 DIAGNOSIS — J309 Allergic rhinitis, unspecified: Secondary | ICD-10-CM | POA: Diagnosis not present

## 2020-07-28 DIAGNOSIS — I2510 Atherosclerotic heart disease of native coronary artery without angina pectoris: Secondary | ICD-10-CM | POA: Diagnosis not present

## 2020-07-28 DIAGNOSIS — I10 Essential (primary) hypertension: Secondary | ICD-10-CM | POA: Diagnosis not present

## 2020-07-28 DIAGNOSIS — I6529 Occlusion and stenosis of unspecified carotid artery: Secondary | ICD-10-CM | POA: Diagnosis not present

## 2020-07-28 DIAGNOSIS — Z1331 Encounter for screening for depression: Secondary | ICD-10-CM | POA: Diagnosis not present

## 2020-07-28 DIAGNOSIS — Z6831 Body mass index (BMI) 31.0-31.9, adult: Secondary | ICD-10-CM | POA: Diagnosis not present

## 2020-07-28 DIAGNOSIS — E1129 Type 2 diabetes mellitus with other diabetic kidney complication: Secondary | ICD-10-CM | POA: Diagnosis not present

## 2020-07-28 DIAGNOSIS — E782 Mixed hyperlipidemia: Secondary | ICD-10-CM | POA: Diagnosis not present

## 2020-08-19 NOTE — Progress Notes (Unsigned)
Cardiology Office Note:    Date:  08/20/2020   ID:  Daniel Gibson, DOB Jul 11, 1950, MRN [id number]  PCP:  Cyndi Bender, PA-C  Cardiologist:  Shirlee More, MD    Referring MD: Cyndi Bender, PA-C    ASSESSMENT:    1. Coronary artery disease of native artery of native heart with stable angina pectoris ([place])   2. Hypertensive heart disease without CHF   3. Hyperlipidemia, unspecified hyperlipidemia type   4. PVC's (premature ventricular contractions)   5. Right-sided carotid artery disease, unspecified type ([place])    PLAN:    In order of problems listed above:  1. Stable CAD he is having no angina we will continue his current medical regimen with aspirin antihypertensives beta-blocker and combined lipid-lowering therapy statin and Zetia.  I advised PCSK9 inhibitor he declined and I will transition him to high-dose rosuvastatin I suspect his LDL remains above target.  He obviously has severe familial hyperlipidemia 2. BP is top range I asked him to trend at home and continue his current antihypertensives including calcium channel blocker beta-blocker 3. Transition to high-dose rosuvastatin plus Zetia follow-up with his PCP 4. Stable no PVCs on his EKG 5. Continues follow with vascular surgery 6. With familial hyperlipidemia premature CAD advised to undergo cardiac CTA.  He had multivessel CAD and heart catheterization in 1993.   Next appointment: 1 year   Medication Adjustments/Labs and Tests Ordered: Current medicines are reviewed at length with the patient today.  Concerns regarding medicines are outlined above.  Orders Placed This Encounter  Procedures  . CT CORONARY MORPH W/CTA COR W/SCORE W/CA W/CM &/OR WO/CM  . CT CORONARY FRACTIONAL FLOW RESERVE DATA PREP  . CT CORONARY FRACTIONAL FLOW RESERVE FLUID ANALYSIS  . Basic metabolic panel   Meds ordered this encounter  Medications  . rosuvastatin (CRESTOR) 40 MG tablet    Sig: Take 1 tablet (40 mg total) by mouth daily.     Dispense:  90 tablet    Refill:  3    Chief Complaint  Patient presents with  . Coronary Artery Disease  . Hypertension  . Hyperlipidemia  . Follow-up    With PVCs,  . Carotid    Right carotid endarterectomy    History of Present Illness:    Daniel Gibson is a 70 y.o. male with a hx of CAD hypertension hyperlipidemia PVC's and carotid stenosis  last seen 05/14/2019. Review of records Dr. Tollie Eth note 03/19/2017:  Coronary angiography showed mild CAD 60% proximal PDA 40% ramus ejection fraction 52% by gated pool 02/13/2013 date of coronary angiography was 11/07/1991 other problems have included inferior wall myocardial infarction 1993 and carotid artery disease with right carotid artery endarterectomy December 2013.    He was 02/17/2020 last seen. Compliance with diet, lifestyle and medications: Yes  He does not trend his blood pressure at home. We again discussed cardiac CTA he is concerned about cost we will schedule it and will ask for the coordinator Lisabeth Pick, RN to try to give him an estimate of his expenses. He is not having angina dyspnea palpitation or syncope. He continues to follow-up with vascular surgery carotid disease no TIA.  Recent labs performed 07/26/2020 shows lipids above target cholesterol total 170 LDL 118 triglycerides 76 HDL 38 he had a normal CMP except for glucose of 198 and hemoglobin is normal A1c 7.7%  Carotid duplex examination 10/08/2019 showed no evidence of restenosis in the right carotid mild to moderate 40 to 59% stenosis left carotid artery  Past Medical History:  Diagnosis Date  . Carotid artery disease ([place]) 05/08/2012   CEA on left 2013  . Diabetes mellitus without complication ([place])   . Food allergy    Alpha Gal allergy   . Hyperlipidemia   . Hypertensive heart disease without CHF 04/08/2018  . Myocardial infarction ([place]) 1993   medically managed  . Obesity (BMI 30-39.9) 04/08/2018  . Prostate cancer ([place]) 08/07/2016  .  PVC's (premature ventricular contractions) 04/08/2018  . Thrombocytopenia ([place])     Past Surgical History:  Procedure Laterality Date  . APPENDECTOMY    . CAROTID ENDARTERECTOMY Right 06-07-12   cea  . ENDARTERECTOMY  06/07/2012   Procedure: ENDARTERECTOMY CAROTID;  Surgeon: Angelia Mould, MD;  Location: [facility] OR;  Service: Vascular;  Laterality: Right;  Right Carotid Endarterectomy with Patch Angioplasty  . TRANSPERINEAL IMPLANT OF RADIATION SEEDS W/ ULTRASOUND      Current Medications: Current Meds  Medication Sig  . amLODipine (NORVASC) 10 MG tablet Take 10 mg by mouth daily.  [name] aspirin 81 MG tablet Take 81 mg by mouth daily.  . carvedilol (COREG) 25 MG tablet Take 12.5 mg by mouth 2 (two) times daily with a meal.  . EPINEPHrine (EPIPEN 2-PAK) 0.3 mg/0.3 mL IJ SOAJ injection Use as directed for life-threatening allergic reaction.  [name] ezetimibe (ZETIA) 10 MG tablet TAKE 1 TABLET BY MOUTH EVERY DAY  . fluticasone (FLONASE) 50 MCG/ACT nasal spray Place 2 sprays into both nostrils daily.  . metFORMIN (GLUCOPHAGE) 1000 MG tablet Take 500 mg by mouth 2 (two) times daily with a meal.   . nitroGLYCERIN (NITROSTAT) 0.4 MG SL tablet Place 1 tablet (0.4 mg total) under the tongue every 5 (five) minutes as needed.  [name] PREVIDENT 5000 DRY MOUTH 1.1 % GEL dental gel 2 (two) times daily. as directed  . rosuvastatin (CRESTOR) 40 MG tablet Take 1 tablet (40 mg total) by mouth daily.  . Tamsulosin HCl (FLOMAX) 0.4 MG CAPS Take 0.8 mg by mouth daily.   . Trospium Chloride 60 MG CP24 Take 1 capsule (60 mg total) by mouth daily.  . [DISCONTINUED] atorvastatin (LIPITOR) 40 MG tablet Take 1 tablet (40 mg total) by mouth daily. Patient uses this medication at bedtime.     Allergies:   Ace inhibitors, Beef-derived products, Pork-derived products, Sheep-derived products, and Tetanus toxoids   Social History   Socioeconomic History  . Marital status: Married    Spouse name: Not on file  . Number of  children: Not on file  . Years of education: Not on file  . Highest education level: Not on file  Occupational History  . Not on file  Tobacco Use  . Smoking status: Never Smoker  . Smokeless tobacco: Never Used  Vaping Use  . Vaping Use: Never used  Substance and Sexual Activity  . Alcohol use: No  . Drug use: No  . Sexual activity: Not on file  Other Topics Concern  . Not on file  Social History Narrative  . Not on file   Social Determinants of Health   Financial Resource Strain: Not on file  Food Insecurity: Not on file  Transportation Needs: Not on file  Physical Activity: Not on file  Stress: Not on file  Social Connections: Not on file     Family History: The patient's family history includes Cancer in his mother; Diabetes in his brother and sister; Heart attack in his father; Heart disease in his brother, father, and sister; Hyperlipidemia in his  brother, father, mother, and sister; Hypertension in his brother, father, mother, and sister. ROS:   Please see the history of present illness.    All other systems reviewed and are negative.  EKGs/Labs/Other Studies Reviewed:    The following studies were reviewed today:   Physical Exam:    VS:  BP (!) 158/60   Pulse 68   Ht 5\' 10"  (1.778 m)   Wt 209 lb 6.4 oz (95 kg)   SpO2 97%   BMI 30.05 kg/m     Wt Readings from Last 3 Encounters:  08/20/20 209 lb 6.4 oz (95 kg)  02/17/20 211 lb (95.7 kg)  10/08/19 211 lb (95.7 kg)     GEN:  Well nourished, well developed in no acute distress HEENT: Normal NECK: No JVD; No carotid bruits LYMPHATICS: No lymphadenopathy CARDIAC: RRR, no murmurs, rubs, gallops RESPIRATORY:  Clear to auscultation without rales, wheezing or rhonchi  ABDOMEN: Soft, non-tender, non-distended MUSCULOSKELETAL:  No edema; No deformity  SKIN: Warm and dry NEUROLOGIC:  Alert and oriented x 3 PSYCHIATRIC:  Normal affect    Signed, Shirlee More, MD  08/20/2020 8:45 AM    [place]

## 2020-08-20 ENCOUNTER — Ambulatory Visit: Payer: Medicare HMO | Admitting: Cardiology

## 2020-08-20 ENCOUNTER — Encounter: Payer: Self-pay | Admitting: Cardiology

## 2020-08-20 ENCOUNTER — Other Ambulatory Visit: Payer: Self-pay

## 2020-08-20 VITALS — BP 142/80 | HR 68 | Ht 70.0 in | Wt 209.4 lb

## 2020-08-20 DIAGNOSIS — E785 Hyperlipidemia, unspecified: Secondary | ICD-10-CM

## 2020-08-20 DIAGNOSIS — I493 Ventricular premature depolarization: Secondary | ICD-10-CM | POA: Diagnosis not present

## 2020-08-20 DIAGNOSIS — I779 Disorder of arteries and arterioles, unspecified: Secondary | ICD-10-CM

## 2020-08-20 DIAGNOSIS — I119 Hypertensive heart disease without heart failure: Secondary | ICD-10-CM

## 2020-08-20 DIAGNOSIS — I25118 Atherosclerotic heart disease of native coronary artery with other forms of angina pectoris: Secondary | ICD-10-CM

## 2020-08-20 MED ORDER — ROSUVASTATIN CALCIUM 40 MG PO TABS: 40.0000 mg | ORAL_TABLET | Freq: Every day | ORAL | 3 refills | Status: DC

## 2020-08-20 MED ORDER — METOPROLOL TARTRATE 100 MG PO TABS: 100.0000 mg | ORAL_TABLET | Freq: Once | ORAL | 0 refills | Status: DC

## 2020-08-20 NOTE — Patient Instructions (Signed)
Medication Instructions:  Your physician has recommended you make the following change in your medication:  START: Rosuvastatin 40 mg take one tablet by mouth daily. STOP: Atorvastatin  *If you need a refill on your cardiac medications before your next appointment, please call your pharmacy*   Lab Work: Your physician recommends that you return for lab work in: Within one week of your cardiac CT  BMP  If you have labs (blood work) drawn today and your tests are completely normal, you will receive your results only by: [name] MyChart Message (if you have MyChart) OR . A paper copy in the mail If you have any lab test that is abnormal or we need to change your treatment, we will call you to review the results.   Testing/Procedures: Your cardiac CT will be scheduled at the below location:   [facility] [address] [place], [place] [address] [phone]  If scheduled at [facility], please arrive at the [facility] main entrance (entrance A) of [facility] 30 minutes prior to test start time. Proceed to the [facility] Radiology Department (first floor) to check-in and test prep.  Please follow these instructions carefully (unless otherwise directed):  On the Night Before the Test: . Be sure to Drink plenty of water. . Do not consume any caffeinated/decaffeinated beverages or chocolate 12 hours prior to your test. . Do not take any antihistamines 12 hours prior to your test.  On the Day of the Test: . Drink plenty of water until 1 hour prior to the test. . Do not eat any food 4 hours prior to the test. . You may take your regular medications prior to the test.  . Take metoprolol (Lopressor) two hours prior to test.       After the Test: . Drink plenty of water. . After receiving IV contrast, you may experience a mild flushed feeling. This is normal. . On occasion, you may experience a mild rash up to 24 hours after the test. This is not dangerous.  If this occurs, you can take Benadryl 25 mg and increase your fluid intake. . If you experience trouble breathing, this can be serious. If it is severe call 911 IMMEDIATELY. If it is mild, please call our office. . If you take any of these medications: Glipizide/Metformin, Avandament, Glucavance, please do not take 48 hours after completing test unless otherwise instructed.   Once we have confirmed authorization from your insurance company, we will call you to set up a date and time for your test. Based on how quickly your insurance processes prior authorizations requests, please allow up to 4 weeks to be contacted for scheduling your Cardiac CT appointment. Be advised that routine Cardiac CT appointments could be scheduled as many as 8 weeks after your provider has ordered it.  For non-scheduling related questions, please contact the cardiac imaging nurse navigator should you have any questions/concerns: Marchia Bond, Cardiac Imaging Nurse Navigator Gordy Clement, Cardiac Imaging Nurse Navigator  Heart and Vascular Services Direct Office Dial: [phone]   For scheduling needs, including cancellations and rescheduling, please call [place], [phone].     Follow-Up: At [facility], you and your health needs are our priority.  As part of our continuing mission to provide you with exceptional heart care, we have created designated Provider Care Teams.  These Care Teams include your primary Cardiologist (physician) and Advanced Practice Providers (APPs -  Physician Assistants and Nurse Practitioners) who all work together to  provide you with the care you need, when you need it.  We recommend signing up for the patient portal called "MyChart".  Sign up information is provided on this After Visit Summary.  MyChart is used to connect with patients for Virtual Visits (Telemedicine).  Patients are able to view lab/test results, encounter notes, upcoming appointments, etc.  Non-urgent  messages can be sent to your provider as well.   To learn more about what you can do with MyChart, go to [email or web address].    Your next appointment:   1 year(s)  The format for your next appointment:   In Person  Provider:   Shirlee More, MD   Other Instructions

## 2020-09-24 ENCOUNTER — Other Ambulatory Visit: Payer: Self-pay

## 2020-09-24 DIAGNOSIS — I779 Disorder of arteries and arterioles, unspecified: Secondary | ICD-10-CM

## 2020-09-24 DIAGNOSIS — E785 Hyperlipidemia, unspecified: Secondary | ICD-10-CM

## 2020-09-24 DIAGNOSIS — I25118 Atherosclerotic heart disease of native coronary artery with other forms of angina pectoris: Secondary | ICD-10-CM | POA: Diagnosis not present

## 2020-09-24 DIAGNOSIS — I119 Hypertensive heart disease without heart failure: Secondary | ICD-10-CM | POA: Diagnosis not present

## 2020-09-24 DIAGNOSIS — I493 Ventricular premature depolarization: Secondary | ICD-10-CM | POA: Diagnosis not present

## 2020-09-24 LAB — BASIC METABOLIC PANEL
BUN/Creatinine Ratio: 11 (ref 10–24)
BUN: 11 mg/dL (ref 8–27)
CO2: 22 mmol/L (ref 20–29)
Calcium: 9.1 mg/dL (ref 8.6–10.2)
Chloride: 101 mmol/L (ref 96–106)
Creatinine, Ser: 1.03 mg/dL (ref 0.76–1.27)
Glucose: 222 mg/dL — ABNORMAL HIGH (ref 65–99)
Potassium: 4.6 mmol/L (ref 3.5–5.2)
Sodium: 137 mmol/L (ref 134–144)
eGFR: 79 mL/min/{1.73_m2} (ref >59)

## 2020-09-27 ENCOUNTER — Telehealth (HOSPITAL_COMMUNITY): Payer: Self-pay | Admitting: Emergency Medicine

## 2020-09-27 ENCOUNTER — Telehealth: Payer: Self-pay

## 2020-09-27 NOTE — Telephone Encounter (Signed)
Calling to review instructions for upcoming CCTA. Pt states it was not a good time to talk but requested I send instructions to his mychart account. I encouraged a callback if he has questions  Marchia Bond RN Navigator Cardiac Imaging [facility] Heart and Vascular Services [phone] Office  [phone] Cell

## 2020-09-27 NOTE — Telephone Encounter (Signed)
-----   Message from Richardo Priest, MD sent at 09/24/2020  8:46 PM EDT ----- Normal BMP

## 2020-09-27 NOTE — Telephone Encounter (Signed)
Spoke with patient regarding results and recommendation.  Patient verbalizes understanding and is agreeable to plan of care. Advised patient to call back with any issues or concerns.  

## 2020-09-29 ENCOUNTER — Ambulatory Visit (HOSPITAL_COMMUNITY)
Admission: RE | Admit: 2020-09-29 | Discharge: 2020-09-29 | Disposition: A | Discharge status: Home / Self Care | Payer: Medicare HMO | Source: Ambulatory Visit | Attending: Cardiology | Admitting: Cardiology

## 2020-09-29 ENCOUNTER — Other Ambulatory Visit: Payer: Self-pay

## 2020-09-29 DIAGNOSIS — I25118 Atherosclerotic heart disease of native coronary artery with other forms of angina pectoris: Secondary | ICD-10-CM | POA: Diagnosis present

## 2020-09-29 DIAGNOSIS — I2510 Atherosclerotic heart disease of native coronary artery without angina pectoris: Secondary | ICD-10-CM | POA: Diagnosis not present

## 2020-09-29 DIAGNOSIS — E785 Hyperlipidemia, unspecified: Secondary | ICD-10-CM | POA: Diagnosis present

## 2020-09-29 DIAGNOSIS — R931 Abnormal findings on diagnostic imaging of heart and coronary circulation: Secondary | ICD-10-CM | POA: Diagnosis not present

## 2020-09-29 DIAGNOSIS — I700 Atherosclerosis of aorta: Secondary | ICD-10-CM | POA: Insufficient documentation

## 2020-09-29 MED ORDER — NITROGLYCERIN 0.4 MG SL SUBL
0.8000 mg | SUBLINGUAL_TABLET | Freq: Once | SUBLINGUAL | Status: AC
  Administered 2020-09-29: 0.8 mg via SUBLINGUAL

## 2020-09-29 MED ORDER — NITROGLYCERIN 0.4 MG SL SUBL
SUBLINGUAL_TABLET | SUBLINGUAL | Status: AC
  Filled 2020-09-29: qty 2

## 2020-09-29 MED ORDER — IOHEXOL 350 MG/ML SOLN
80.0000 mL | Freq: Once | INTRAVENOUS | Status: AC | PRN
  Administered 2020-09-29: 80 mL via INTRAVENOUS

## 2020-09-30 ENCOUNTER — Telehealth: Payer: Self-pay

## 2020-09-30 NOTE — Telephone Encounter (Signed)
Left message on patients voicemail to please return our call.   

## 2020-09-30 NOTE — Telephone Encounter (Signed)
-----   Message from Richardo Priest, MD sent at 09/30/2020  3:23 PM EDT ----- He has marked progression in coronary artery disease with severe blockage in several of the arteries  Despite feeling good I think he needs to undergo heart catheterization.  If he like to discuss placement in my office hours in [facility] next week if he like to proceed I can go ahead and place orders.

## 2020-10-01 ENCOUNTER — Telehealth: Payer: Self-pay

## 2020-10-01 DIAGNOSIS — I25118 Atherosclerotic heart disease of native coronary artery with other forms of angina pectoris: Secondary | ICD-10-CM

## 2020-10-01 NOTE — Telephone Encounter (Signed)
Spoke with patient regarding results and recommendation.  Patient verbalizes understanding and is agreeable to plan of care. Advised patient to call back with any issues or concerns.   He would like to proceed with the heart catheterization at this time.

## 2020-10-01 NOTE — Addendum Note (Signed)
Addended by: Shirlee More on: 10/01/2020 04:12 PM   Modules accepted: Orders, SmartSet

## 2020-10-01 NOTE — Telephone Encounter (Signed)
-----   Message from Richardo Priest, MD sent at 09/30/2020  3:23 PM EDT ----- He has marked progression in coronary artery disease with severe blockage in several of the arteries  Despite feeling good I think he needs to undergo heart catheterization.  If he like to discuss placement in my office hours in [facility] next week if he like to proceed I can go ahead and place orders.

## 2020-10-04 ENCOUNTER — Telehealth: Payer: Self-pay

## 2020-10-04 ENCOUNTER — Telehealth: Payer: Self-pay | Admitting: *Deleted

## 2020-10-04 DIAGNOSIS — Z01812 Encounter for preprocedural laboratory examination: Secondary | ICD-10-CM

## 2020-10-04 DIAGNOSIS — I25118 Atherosclerotic heart disease of native coronary artery with other forms of angina pectoris: Secondary | ICD-10-CM

## 2020-10-04 LAB — BASIC METABOLIC PANEL
BUN/Creatinine Ratio: 10 (ref 10–24)
BUN: 12 mg/dL (ref 8–27)
CO2: 20 mmol/L (ref 20–29)
Calcium: 9 mg/dL (ref 8.6–10.2)
Chloride: 100 mmol/L (ref 96–106)
Creatinine, Ser: 1.18 mg/dL (ref 0.76–1.27)
Glucose: 215 mg/dL — ABNORMAL HIGH (ref 65–99)
Potassium: 4.3 mmol/L (ref 3.5–5.2)
Sodium: 137 mmol/L (ref 134–144)
eGFR: 67 mL/min/{1.73_m2} (ref >59)

## 2020-10-04 LAB — CBC
Hematocrit: 43.9 % (ref 37.5–51.0)
Hemoglobin: 15.6 g/dL (ref 13.0–17.7)
MCH: 31.6 pg (ref 26.6–33.0)
MCHC: 35.5 g/dL (ref 31.5–35.7)
MCV: 89 fL (ref 79–97)
Platelets: 175 10*3/uL (ref 150–450)
RBC: 4.93 x10E6/uL (ref 4.14–5.80)
RDW: 11.9 % (ref 11.6–15.4)
WBC: 10.5 10*3/uL (ref 3.4–10.8)

## 2020-10-04 NOTE — Telephone Encounter (Signed)
Left message on patients voicemail to please return our call.   

## 2020-10-04 NOTE — Telephone Encounter (Addendum)
Entry error

## 2020-10-04 NOTE — Telephone Encounter (Signed)
-----   Message from Katrine Coho, RN sent at 10/04/2020  2:28 PM EDT ----- Regarding: RE: [place] April 14 Has he had an office visit with Dr Bettina Gavia since 08/20/20?  If that is last office visit, that would be more than 30 days prior to cath 10/08/20.  ----- Message ----- From: Gita Kudo, RN Sent: 10/04/2020   2:06 PM EDT To: Katrine Coho, RN Subject: RE: [place] April 14                               Is this 30 days prior to the cath? He is scheduled for Thursday.  ----- Message ----- From: Katrine Coho, RN Sent: 10/04/2020   1:46 PM EDT To: Katrine Coho, RN, Gita Kudo, RN Subject: [place] April 14                                   Corena Herter,  Last office visit with Dr Bettina Gavia 08/20/20-he needs office visit within 30 days of cath per [facility]cath [facility] policy.  Thanks, Webb Silversmith

## 2020-10-05 ENCOUNTER — Other Ambulatory Visit (HOSPITAL_COMMUNITY)
Admission: RE | Admit: 2020-10-05 | Discharge: 2020-10-05 | Disposition: A | Discharge status: Home / Self Care | Payer: Medicare HMO | Source: Ambulatory Visit | Attending: Cardiovascular Disease | Admitting: Cardiovascular Disease

## 2020-10-05 DIAGNOSIS — Z01812 Encounter for preprocedural laboratory examination: Secondary | ICD-10-CM | POA: Diagnosis not present

## 2020-10-05 DIAGNOSIS — Z20822 Contact with and (suspected) exposure to covid-19: Secondary | ICD-10-CM | POA: Insufficient documentation

## 2020-10-05 LAB — SARS CORONAVIRUS 2 (TAT 6-24 HRS): SARS Coronavirus 2: NEGATIVE

## 2020-10-06 ENCOUNTER — Other Ambulatory Visit: Payer: Self-pay

## 2020-10-06 ENCOUNTER — Telehealth: Payer: Self-pay | Admitting: *Deleted

## 2020-10-06 ENCOUNTER — Encounter: Payer: Self-pay | Admitting: Cardiology

## 2020-10-06 ENCOUNTER — Ambulatory Visit: Payer: Medicare HMO | Admitting: Cardiology

## 2020-10-06 VITALS — BP 160/88 | HR 81 | Ht 70.0 in | Wt 211.0 lb

## 2020-10-06 DIAGNOSIS — I119 Hypertensive heart disease without heart failure: Secondary | ICD-10-CM

## 2020-10-06 DIAGNOSIS — E118 Type 2 diabetes mellitus with unspecified complications: Secondary | ICD-10-CM

## 2020-10-06 DIAGNOSIS — I25118 Atherosclerotic heart disease of native coronary artery with other forms of angina pectoris: Secondary | ICD-10-CM

## 2020-10-06 DIAGNOSIS — E785 Hyperlipidemia, unspecified: Secondary | ICD-10-CM

## 2020-10-06 DIAGNOSIS — E669 Obesity, unspecified: Secondary | ICD-10-CM | POA: Diagnosis not present

## 2020-10-06 NOTE — Progress Notes (Signed)
Cardiology Office Note:    Date:  10/06/2020   ID:  Daniel Gibson, DOB 1951/06/20, MRN [id number]  PCP:  Cyndi Bender, PA-C  Cardiologist:  No primary care provider on file.  Electrophysiologist:  None   Referring MD: Cyndi Bender, PA-C   " Dr Bettina Gavia told me I need a heart cath"  History of Present Illness:    Daniel Gibson is a 70 y.o. male with a hx of coronary artery disease with abnormal CT FFR, hypertension, hyperlipidemia, PVCs, carotid artery stenosis.  The patient follows with Dr. Bettina Gavia and was last seen by him in February 2022 at that time he was advised to undergo a coronary CTA.  He did have his coronary CTA show significant multivessel disease and at this time cardiac catheterization has been recommended.  The patient did speak with his primary cardiologist given recommendation heart catheterization.  Here today for follow-up visit.  Here for no complaints.  Past Medical History:  Diagnosis Date  . Carotid artery disease ([place]) 05/08/2012   CEA on left 2013  . Diabetes mellitus without complication ([place])   . Food allergy    Alpha Gal allergy   . Hyperlipidemia   . Hypertensive heart disease without CHF 04/08/2018  . Myocardial infarction ([place]) 1993   medically managed  . Obesity (BMI 30-39.9) 04/08/2018  . Prostate cancer ([place]) 08/07/2016  . PVC's (premature ventricular contractions) 04/08/2018  . Thrombocytopenia ([place])     Past Surgical History:  Procedure Laterality Date  . APPENDECTOMY    . CAROTID ENDARTERECTOMY Right 06-07-12   cea  . ENDARTERECTOMY  06/07/2012   Procedure: ENDARTERECTOMY CAROTID;  Surgeon: Angelia Mould, MD;  Location: [facility] OR;  Service: Vascular;  Laterality: Right;  Right Carotid Endarterectomy with Patch Angioplasty  . TRANSPERINEAL IMPLANT OF RADIATION SEEDS W/ ULTRASOUND      Current Medications: Current Meds  Medication Sig  . acetaminophen (TYLENOL) 500 MG tablet Take 500-1,000 mg by mouth every 6 (six) hours as  needed (pain).  [name] amLODipine (NORVASC) 10 MG tablet Take 10 mg by mouth in the morning.  [name] aspirin EC 81 MG tablet Take 81 mg by mouth in the morning. Swallow whole.  . carvedilol (COREG) 25 MG tablet Take 12.5 mg by mouth 2 (two) times daily with a meal.  . EPINEPHrine (EPIPEN 2-PAK) 0.3 mg/0.3 mL IJ SOAJ injection Use as directed for life-threatening allergic reaction.  [name] ezetimibe (ZETIA) 10 MG tablet TAKE 1 TABLET BY MOUTH EVERY DAY  . fluticasone (FLONASE) 50 MCG/ACT nasal spray Place 2 sprays into both nostrils daily as needed for allergies.  . metFORMIN (GLUCOPHAGE) 1000 MG tablet Take 500 mg by mouth in the morning and at bedtime.  . nitroGLYCERIN (NITROSTAT) 0.4 MG SL tablet Place 1 tablet (0.4 mg total) under the tongue every 5 (five) minutes as needed.  [name] PREVIDENT 5000 DRY MOUTH 1.1 % GEL dental gel Place 1 application onto teeth 2 (two) times daily.  . rosuvastatin (CRESTOR) 40 MG tablet Take 1 tablet (40 mg total) by mouth daily.  . Tamsulosin HCl (FLOMAX) 0.4 MG CAPS Take 0.8 mg by mouth at bedtime.  . Trospium Chloride 60 MG CP24 Take 1 capsule (60 mg total) by mouth daily.     Allergies:   Ace inhibitors, Beef-derived products, Pork-derived products, Sheep-derived products, and Tetanus toxoids   Social History   Socioeconomic History  . Marital status: Married    Spouse name: Not on file  . Number of children: Not on file  .  Years of education: Not on file  . Highest education level: Not on file  Occupational History  . Not on file  Tobacco Use  . Smoking status: Never Smoker  . Smokeless tobacco: Never Used  Vaping Use  . Vaping Use: Never used  Substance and Sexual Activity  . Alcohol use: No  . Drug use: No  . Sexual activity: Not on file  Other Topics Concern  . Not on file  Social History Narrative  . Not on file   Social Determinants of Health   Financial Resource Strain: Not on file  Food Insecurity: Not on file  Transportation Needs: Not on file   Physical Activity: Not on file  Stress: Not on file  Social Connections: Not on file     Family History: The patient's family history includes Cancer in his mother; Diabetes in his brother and sister; Heart attack in his father; Heart disease in his brother, father, and sister; Hyperlipidemia in his brother, father, mother, and sister; Hypertension in his brother, father, mother, and sister.  ROS:   Review of Systems  Constitution: Negative for decreased appetite, fever and weight gain.  HENT: Negative for congestion, ear discharge, hoarse voice and sore throat.   Eyes: Negative for discharge, redness, vision loss in right eye and visual halos.  Cardiovascular: Negative for chest pain, dyspnea on exertion, leg swelling, orthopnea and palpitations.  Respiratory: Negative for cough, hemoptysis, shortness of breath and snoring.   Endocrine: Negative for heat intolerance and polyphagia.  Hematologic/Lymphatic: Negative for bleeding problem. Does not bruise/bleed easily.  Skin: Negative for flushing, nail changes, rash and suspicious lesions.  Musculoskeletal: Negative for arthritis, joint pain, muscle cramps, myalgias, neck pain and stiffness.  Gastrointestinal: Negative for abdominal pain, bowel incontinence, diarrhea and excessive appetite.  Genitourinary: Negative for decreased libido, genital sores and incomplete emptying.  Neurological: Negative for brief paralysis, focal weakness, headaches and loss of balance.  Psychiatric/Behavioral: Negative for altered mental status, depression and suicidal ideas.  Allergic/Immunologic: Negative for HIV exposure and persistent infections.    EKGs/Labs/Other Studies Reviewed:    The following studies were reviewed today:   EKG:  The ekg ordered today demonstrates sinus rhythm, heart rate 81 bpm with Q waves in the inferior leads suggesting old inferior wall infarction.  Coronary CTA 09/2020  IMPRESSION: 1. Coronary calcium score of 896.  This was 84th percentile for age and sex matched control.  2. Normal coronary origin with left dominance.  3. Diffuse multivessel coronary calcifications. Degree of stenosis may be overestimated due to severe calcifications  4. Calcified plaque in proximal RCA causes severe (70-99%) stenosis. RCA is small and nondominant  5. Calcified plaque in the distal left main causes mild (24-49%) stenosis  6. Calcified plaque in the proximal LAD causes moderate (50-69%) stenosis. Calcified plaque in the mid LAD causing moderate (50-69%) stenosis  7. Calcified plaque in the proximal LCX causing severe (70-99%) stenosis. Calcified plaque in the mid LCX causes moderate (50-69%) stenosis. Calcified plaque in the distal LCX causes severe(70-99%) stenosis.  8. Akinesis of LV mid inferior wall  9. Will send study for CTFFR  CAD-RADS 4 Severe stenosis. (70-99% or > 50% left main). Cardiac catheterization or CT FFR is recommended. Consider symptom-guided anti-ischemic pharmacotherapy as well as risk factor modification per guideline directed care.   Recent Labs: 10/04/2020: BUN 12; Creatinine, Ser 1.18; Hemoglobin 15.6; Platelets 175; Potassium 4.3; Sodium 137  Recent Lipid Panel No results found for: CHOL, TRIG, HDL, CHOLHDL, VLDL, LDLCALC, LDLDIRECT  Physical Exam:    VS:  BP (!) 160/88   Pulse 81   Ht 5\' 10"  (1.778 m)   Wt 211 lb (95.7 kg)   SpO2 96%   BMI 30.28 kg/m     Wt Readings from Last 3 Encounters:  10/06/20 211 lb (95.7 kg)  08/20/20 209 lb 6.4 oz (95 kg)  02/17/20 211 lb (95.7 kg)     GEN: Well nourished, well developed in no acute distress HEENT: Normal NECK: No JVD; No carotid bruits LYMPHATICS: No lymphadenopathy CARDIAC: S1S2 noted,RRR, no murmurs, rubs, gallops RESPIRATORY:  Clear to auscultation without rales, wheezing or rhonchi  ABDOMEN: Soft, non-tender, non-distended, +bowel sounds, no guarding. EXTREMITIES: No edema, No cyanosis, no  clubbing MUSCULOSKELETAL:  No deformity  SKIN: Warm and dry NEUROLOGIC:  Alert and oriented x 3, non-focal PSYCHIATRIC:  Normal affect, good insight  ASSESSMENT:    1. Coronary artery disease of native artery of native heart with stable angina pectoris ([place])   2. Type 2 diabetes mellitus with complication, without long-term current use of insulin (HCC)   3. Obesity (BMI 30-39.9)   4. Hypertensive heart disease without CHF   5. Hyperlipidemia, unspecified hyperlipidemia type    PLAN:    He is aware of his heart catheterization -he did speak to his primary cardiologist Dr. Bettina Gavia about this procedure.  Today continues visit I was able to educate the patient again about the heart catheterization.   The patient understands that risks include but are not limited to stroke (1 in 1000), death (1 in 79), kidney failure [usually temporary] (1 in 500), bleeding (1 in 200), allergic reaction [possibly serious] (1 in 200), and agrees to proceed.  He is hypertensive in the office today.  Unfortunately after speaking to the patient it appears that he does not take his medication as prescribed.  He missed his antihypertensive medication yesterday and daily which are amlodipine 10 mg daily as well as carvedilol 12.5 mg twice a day.  Advised the patient that he needs to take his medication.  This is very hard to adjust his medications if he is not compliant explained to him that ideally his mid blood pressure goal is less than 13/14mmHg.  This is being managed by his primary care doctor.  No adjustments for antidiabetic medications were made today.  Hyperlipidemia - continue with current statin medication.  The patient is in agreement with the above plan. The patient left the office in stable condition.  The patient will follow up post heart catheterization with Dr. Bettina Gavia   Medication Adjustments/Labs and Tests Ordered: Current medicines are reviewed at length with the patient today.  Concerns  regarding medicines are outlined above.  Orders Placed This Encounter  Procedures  . EKG 12-Lead   No orders of the defined types were placed in this encounter.   Patient Instructions  Medication Instructions:  Your physician recommends that you continue on your current medications as directed. Please refer to the Current Medication list given to you today.  *If you need a refill on your cardiac medications before your next appointment, please call your pharmacy*   Lab Work: None If you have labs (blood work) drawn today and your tests are completely normal, you will receive your results only by: [name] MyChart Message (if you have MyChart) OR . A paper copy in the mail If you have any lab test that is abnormal or we need to change your treatment, we will call you to review the results.  Testing/Procedures:    [place], [place] [address] Dept: [phone] Loc: [phone]  Emileo Semel  10/06/2020  You are scheduled for a Cardiac Catheterization on Thursday, April 14 with Dr. Shelva Majestic.  1. Please arrive at the [facility] (Main Entrance A) at [facility]: [address] [place], [place] [address] at 5:30 AM (This time is two hours before your procedure to ensure your preparation). Free valet parking service is available.   Special note: Every effort is made to have your procedure done on time. Please understand that emergencies sometimes delay scheduled procedures.  2. Diet: Do not eat solid foods after midnight.  The patient may have clear liquids until 5am upon the day of the procedure.  3. Labs: Have been completed  4. Medication instructions in preparation for your procedure:   Contrast Allergy: No  On the morning of your procedure, take your Aspirin and any morning medicines NOT listed above.  You may use sips of water.  5. Plan for one  night stay--bring personal belongings. 6. Bring a current list of your medications and current insurance cards. 7. You MUST have a responsible person to drive you home. 8. Someone MUST be with you the first 24 hours after you arrive home or your discharge will be delayed. 9. Please wear clothes that are easy to get on and off and wear slip-on shoes.  Thank you for allowing [place] to care for you!   -- [place] [facility]    Follow-Up: At [facility], you and your health needs are our priority.  As part of our continuing mission to provide you with exceptional heart care, we have created designated Provider Care Teams.  These Care Teams include your primary Cardiologist (physician) and Advanced Practice Providers (APPs -  Physician Assistants and Nurse Practitioners) who all work together to provide you with the care you need, when you need it.  We recommend signing up for the patient portal called "MyChart".  Sign up information is provided on this After Visit Summary.  MyChart is used to connect with patients for Virtual Visits (Telemedicine).  Patients are able to view lab/test results, encounter notes, upcoming appointments, etc.  Non-urgent messages can be sent to your provider as well.   To learn more about what you can do with MyChart, go to [email or web address].    Your next appointment:   4 week(s)  The format for your next appointment:   In Person  Provider:   Shirlee More, MD   Other Instructions      Adopting a Healthy Lifestyle.  Know what a healthy weight is for you (roughly BMI <25) and aim to maintain this   Aim for 7+ servings of fruits and vegetables daily   65-80+ fluid ounces of water or unsweet tea for healthy kidneys   Limit to max 1 drink of alcohol per day; avoid smoking/tobacco   Limit animal fats in diet for cholesterol and heart health - choose grass fed whenever available   Avoid highly processed foods, and foods high  in saturated/trans fats   Aim for low stress - take time to unwind and care for your mental health   Aim for 150 min of moderate intensity exercise weekly for heart health, and weights twice weekly for bone health   Aim for 7-9 hours of sleep daily   When it comes to diets, agreement about  the perfect plan isnt easy to find, even among the experts. Experts at the [employer] developed an idea known as the Healthy Eating Plate. Just imagine a plate divided into logical, healthy portions.   The emphasis is on diet quality:   Load up on vegetables and fruits - one-half of your plate: Aim for color and variety, and remember that potatoes dont count.   Go for whole grains - one-quarter of your plate: Whole wheat, barley, wheat berries, quinoa, oats, brown rice, and foods made with them. If you want pasta, go with whole wheat pasta.   Protein power - one-quarter of your plate: Fish, chicken, beans, and nuts are all healthy, versatile protein sources. Limit red meat.   The diet, however, does go beyond the plate, offering a few other suggestions.   Use healthy plant oils, such as olive, canola, soy, corn, sunflower and peanut. Check the labels, and avoid partially hydrogenated oil, which have unhealthy trans fats.   If youre thirsty, drink water. Coffee and tea are good in moderation, but skip sugary drinks and limit milk and dairy products to one or two daily servings.   The type of carbohydrate in the diet is more important than the amount. Some sources of carbohydrates, such as vegetables, fruits, whole grains, and beans-are healthier than others.   Finally, stay active  Signed, Berniece Salines, DO  10/06/2020 9:56 AM    [place]

## 2020-10-06 NOTE — H&P (View-Only) (Signed)
Cardiology Office Note:    Date:  10/06/2020   ID:  Daniel Gibson, DOB Feb 17, 1951, MRN [id number]  PCP:  Cyndi Bender, PA-C  Cardiologist:  No primary care provider on file.  Electrophysiologist:  None   Referring MD: Cyndi Bender, PA-C   " Dr Bettina Gavia told me I need a heart cath"  History of Present Illness:    Daniel Gibson is a 70 y.o. male with a hx of coronary artery disease with abnormal CT FFR, hypertension, hyperlipidemia, PVCs, carotid artery stenosis.  The patient follows with Dr. Bettina Gavia and was last seen by him in February 2022 at that time he was advised to undergo a coronary CTA.  He did have his coronary CTA show significant multivessel disease and at this time cardiac catheterization has been recommended.  The patient did speak with his primary cardiologist given recommendation heart catheterization.  Here today for follow-up visit.  Here for no complaints.  Past Medical History:  Diagnosis Date  . Carotid artery disease ([place]) 05/08/2012   CEA on left 2013  . Diabetes mellitus without complication ([place])   . Food allergy    Alpha Gal allergy   . Hyperlipidemia   . Hypertensive heart disease without CHF 04/08/2018  . Myocardial infarction ([place]) 1993   medically managed  . Obesity (BMI 30-39.9) 04/08/2018  . Prostate cancer ([place]) 08/07/2016  . PVC's (premature ventricular contractions) 04/08/2018  . Thrombocytopenia ([place])     Past Surgical History:  Procedure Laterality Date  . APPENDECTOMY    . CAROTID ENDARTERECTOMY Right 06-07-12   cea  . ENDARTERECTOMY  06/07/2012   Procedure: ENDARTERECTOMY CAROTID;  Surgeon: Angelia Mould, MD;  Location: [facility] OR;  Service: Vascular;  Laterality: Right;  Right Carotid Endarterectomy with Patch Angioplasty  . TRANSPERINEAL IMPLANT OF RADIATION SEEDS W/ ULTRASOUND      Current Medications: Current Meds  Medication Sig  . acetaminophen (TYLENOL) 500 MG tablet Take 500-1,000 mg by mouth every 6 (six) hours as  needed (pain).  [name] amLODipine (NORVASC) 10 MG tablet Take 10 mg by mouth in the morning.  [name] aspirin EC 81 MG tablet Take 81 mg by mouth in the morning. Swallow whole.  . carvedilol (COREG) 25 MG tablet Take 12.5 mg by mouth 2 (two) times daily with a meal.  . EPINEPHrine (EPIPEN 2-PAK) 0.3 mg/0.3 mL IJ SOAJ injection Use as directed for life-threatening allergic reaction.  [name] ezetimibe (ZETIA) 10 MG tablet TAKE 1 TABLET BY MOUTH EVERY DAY  . fluticasone (FLONASE) 50 MCG/ACT nasal spray Place 2 sprays into both nostrils daily as needed for allergies.  . metFORMIN (GLUCOPHAGE) 1000 MG tablet Take 500 mg by mouth in the morning and at bedtime.  . nitroGLYCERIN (NITROSTAT) 0.4 MG SL tablet Place 1 tablet (0.4 mg total) under the tongue every 5 (five) minutes as needed.  [name] PREVIDENT 5000 DRY MOUTH 1.1 % GEL dental gel Place 1 application onto teeth 2 (two) times daily.  . rosuvastatin (CRESTOR) 40 MG tablet Take 1 tablet (40 mg total) by mouth daily.  . Tamsulosin HCl (FLOMAX) 0.4 MG CAPS Take 0.8 mg by mouth at bedtime.  . Trospium Chloride 60 MG CP24 Take 1 capsule (60 mg total) by mouth daily.     Allergies:   Ace inhibitors, Beef-derived products, Pork-derived products, Sheep-derived products, and Tetanus toxoids   Social History   Socioeconomic History  . Marital status: Married    Spouse name: Not on file  . Number of children: Not on file  .  Years of education: Not on file  . Highest education level: Not on file  Occupational History  . Not on file  Tobacco Use  . Smoking status: Never Smoker  . Smokeless tobacco: Never Used  Vaping Use  . Vaping Use: Never used  Substance and Sexual Activity  . Alcohol use: No  . Drug use: No  . Sexual activity: Not on file  Other Topics Concern  . Not on file  Social History Narrative  . Not on file   Social Determinants of Health   Financial Resource Strain: Not on file  Food Insecurity: Not on file  Transportation Needs: Not on file   Physical Activity: Not on file  Stress: Not on file  Social Connections: Not on file     Family History: The patient's family history includes Cancer in his mother; Diabetes in his brother and sister; Heart attack in his father; Heart disease in his brother, father, and sister; Hyperlipidemia in his brother, father, mother, and sister; Hypertension in his brother, father, mother, and sister.  ROS:   Review of Systems  Constitution: Negative for decreased appetite, fever and weight gain.  HENT: Negative for congestion, ear discharge, hoarse voice and sore throat.   Eyes: Negative for discharge, redness, vision loss in right eye and visual halos.  Cardiovascular: Negative for chest pain, dyspnea on exertion, leg swelling, orthopnea and palpitations.  Respiratory: Negative for cough, hemoptysis, shortness of breath and snoring.   Endocrine: Negative for heat intolerance and polyphagia.  Hematologic/Lymphatic: Negative for bleeding problem. Does not bruise/bleed easily.  Skin: Negative for flushing, nail changes, rash and suspicious lesions.  Musculoskeletal: Negative for arthritis, joint pain, muscle cramps, myalgias, neck pain and stiffness.  Gastrointestinal: Negative for abdominal pain, bowel incontinence, diarrhea and excessive appetite.  Genitourinary: Negative for decreased libido, genital sores and incomplete emptying.  Neurological: Negative for brief paralysis, focal weakness, headaches and loss of balance.  Psychiatric/Behavioral: Negative for altered mental status, depression and suicidal ideas.  Allergic/Immunologic: Negative for HIV exposure and persistent infections.    EKGs/Labs/Other Studies Reviewed:    The following studies were reviewed today:   EKG:  The ekg ordered today demonstrates sinus rhythm, heart rate 81 bpm with Q waves in the inferior leads suggesting old inferior wall infarction.  Coronary CTA 09/2020  IMPRESSION: 1. Coronary calcium score of 896.  This was 84th percentile for age and sex matched control.  2. Normal coronary origin with left dominance.  3. Diffuse multivessel coronary calcifications. Degree of stenosis may be overestimated due to severe calcifications  4. Calcified plaque in proximal RCA causes severe (70-99%) stenosis. RCA is small and nondominant  5. Calcified plaque in the distal left main causes mild (24-49%) stenosis  6. Calcified plaque in the proximal LAD causes moderate (50-69%) stenosis. Calcified plaque in the mid LAD causing moderate (50-69%) stenosis  7. Calcified plaque in the proximal LCX causing severe (70-99%) stenosis. Calcified plaque in the mid LCX causes moderate (50-69%) stenosis. Calcified plaque in the distal LCX causes severe(70-99%) stenosis.  8. Akinesis of LV mid inferior wall  9. Will send study for CTFFR  CAD-RADS 4 Severe stenosis. (70-99% or > 50% left main). Cardiac catheterization or CT FFR is recommended. Consider symptom-guided anti-ischemic pharmacotherapy as well as risk factor modification per guideline directed care.   Recent Labs: 10/04/2020: BUN 12; Creatinine, Ser 1.18; Hemoglobin 15.6; Platelets 175; Potassium 4.3; Sodium 137  Recent Lipid Panel No results found for: CHOL, TRIG, HDL, CHOLHDL, VLDL, LDLCALC, LDLDIRECT  Physical Exam:    VS:  BP (!) 160/88   Pulse 81   Ht 5\' 10"  (1.778 m)   Wt 211 lb (95.7 kg)   SpO2 96%   BMI 30.28 kg/m     Wt Readings from Last 3 Encounters:  10/06/20 211 lb (95.7 kg)  08/20/20 209 lb 6.4 oz (95 kg)  02/17/20 211 lb (95.7 kg)     GEN: Well nourished, well developed in no acute distress HEENT: Normal NECK: No JVD; No carotid bruits LYMPHATICS: No lymphadenopathy CARDIAC: S1S2 noted,RRR, no murmurs, rubs, gallops RESPIRATORY:  Clear to auscultation without rales, wheezing or rhonchi  ABDOMEN: Soft, non-tender, non-distended, +bowel sounds, no guarding. EXTREMITIES: No edema, No cyanosis, no  clubbing MUSCULOSKELETAL:  No deformity  SKIN: Warm and dry NEUROLOGIC:  Alert and oriented x 3, non-focal PSYCHIATRIC:  Normal affect, good insight  ASSESSMENT:    1. Coronary artery disease of native artery of native heart with stable angina pectoris ([place])   2. Type 2 diabetes mellitus with complication, without long-term current use of insulin (H[facility])   3. Obesity (BMI 30-39.9)   4. Hypertensive heart disease without CHF   5. Hyperlipidemia, unspecified hyperlipidemia type    PLAN:    He is aware of his heart catheterization -he did speak to his primary cardiologist Dr. Bettina Gavia about this procedure.  Today continues visit I was able to educate the patient again about the heart catheterization.   The patient understands that risks include but are not limited to stroke (1 in 1000), death (1 in 74), kidney failure [usually temporary] (1 in 500), bleeding (1 in 200), allergic reaction [possibly serious] (1 in 200), and agrees to proceed.  He is hypertensive in the office today.  Unfortunately after speaking to the patient it appears that he does not take his medication as prescribed.  He missed his antihypertensive medication yesterday and daily which are amlodipine 10 mg daily as well as carvedilol 12.5 mg twice a day.  Advised the patient that he needs to take his medication.  This is very hard to adjust his medications if he is not compliant explained to him that ideally his mid blood pressure goal is less than 13/72mmHg.  This is being managed by his primary care doctor.  No adjustments for antidiabetic medications were made today.  Hyperlipidemia - continue with current statin medication.  The patient is in agreement with the above plan. The patient left the office in stable condition.  The patient will follow up post heart catheterization with Dr. Bettina Gavia   Medication Adjustments/Labs and Tests Ordered: Current medicines are reviewed at length with the patient today.  Concerns  regarding medicines are outlined above.  Orders Placed This Encounter  Procedures  . EKG 12-Lead   No orders of the defined types were placed in this encounter.   Patient Instructions  Medication Instructions:  Your physician recommends that you continue on your current medications as directed. Please refer to the Current Medication list given to you today.  *If you need a refill on your cardiac medications before your next appointment, please call your pharmacy*   Lab Work: None If you have labs (blood work) drawn today and your tests are completely normal, you will receive your results only by: [name] MyChart Message (if you have MyChart) OR . A paper copy in the mail If you have any lab test that is abnormal or we need to change your treatment, we will call you to review the results.  Testing/Procedures:    [place] [address] [place], [place] [address] Dept: [phone] Loc: [phone]  Ripley Bogosian  10/06/2020  You are scheduled for a Cardiac Catheterization on Thursday, April 14 with Dr. Shelva Majestic.  1. Please arrive at the [facility] (Main Entrance A) at [facility]: [address] [place], [place] [address] at 5:30 AM (This time is two hours before your procedure to ensure your preparation). Free valet parking service is available.   Special note: Every effort is made to have your procedure done on time. Please understand that emergencies sometimes delay scheduled procedures.  2. Diet: Do not eat solid foods after midnight.  The patient may have clear liquids until 5am upon the day of the procedure.  3. Labs: Have been completed  4. Medication instructions in preparation for your procedure:   Contrast Allergy: No  On the morning of your procedure, take your Aspirin and any morning medicines NOT listed above.  You may use sips of water.  5. Plan for one  night stay--bring personal belongings. 6. Bring a current list of your medications and current insurance cards. 7. You MUST have a responsible person to drive you home. 8. Someone MUST be with you the first 24 hours after you arrive home or your discharge will be delayed. 9. Please wear clothes that are easy to get on and off and wear slip-on shoes.  Thank you for allowing [place] to care for you!   -- [place] [facility]    Follow-Up: At [facility], you and your health needs are our priority.  As part of our continuing mission to provide you with exceptional heart care, we have created designated Provider Care Teams.  These Care Teams include your primary Cardiologist (physician) and Advanced Practice Providers (APPs -  Physician Assistants and Nurse Practitioners) who all work together to provide you with the care you need, when you need it.  We recommend signing up for the patient portal called "MyChart".  Sign up information is provided on this After Visit Summary.  MyChart is used to connect with patients for Virtual Visits (Telemedicine).  Patients are able to view lab/test results, encounter notes, upcoming appointments, etc.  Non-urgent messages can be sent to your provider as well.   To learn more about what you can do with MyChart, go to [email or web address].    Your next appointment:   4 week(s)  The format for your next appointment:   In Person  Provider:   Shirlee More, MD   Other Instructions      Adopting a Healthy Lifestyle.  Know what a healthy weight is for you (roughly BMI <25) and aim to maintain this   Aim for 7+ servings of fruits and vegetables daily   65-80+ fluid ounces of water or unsweet tea for healthy kidneys   Limit to max 1 drink of alcohol per day; avoid smoking/tobacco   Limit animal fats in diet for cholesterol and heart health - choose grass fed whenever available   Avoid highly processed foods, and foods high  in saturated/trans fats   Aim for low stress - take time to unwind and care for your mental health   Aim for 150 min of moderate intensity exercise weekly for heart health, and weights twice weekly for bone health   Aim for 7-9 hours of sleep daily   When it comes to diets, agreement about  the perfect plan isnt easy to find, even among the experts. Experts at the [employer] developed an idea known as the Healthy Eating Plate. Just imagine a plate divided into logical, healthy portions.   The emphasis is on diet quality:   Load up on vegetables and fruits - one-half of your plate: Aim for color and variety, and remember that potatoes dont count.   Go for whole grains - one-quarter of your plate: Whole wheat, barley, wheat berries, quinoa, oats, brown rice, and foods made with them. If you want pasta, go with whole wheat pasta.   Protein power - one-quarter of your plate: Fish, chicken, beans, and nuts are all healthy, versatile protein sources. Limit red meat.   The diet, however, does go beyond the plate, offering a few other suggestions.   Use healthy plant oils, such as olive, canola, soy, corn, sunflower and peanut. Check the labels, and avoid partially hydrogenated oil, which have unhealthy trans fats.   If youre thirsty, drink water. Coffee and tea are good in moderation, but skip sugary drinks and limit milk and dairy products to one or two daily servings.   The type of carbohydrate in the diet is more important than the amount. Some sources of carbohydrates, such as vegetables, fruits, whole grains, and beans-are healthier than others.   Finally, stay active  Signed, Berniece Salines, DO  10/06/2020 9:56 AM    [place]

## 2020-10-06 NOTE — Telephone Encounter (Signed)
Call placed to patient to review instructions for cath 10/07/20, pt is aware not to take metformin 10/07/20 and for 48 hours after cath.

## 2020-10-06 NOTE — Patient Instructions (Signed)
Medication Instructions:  Your physician recommends that you continue on your current medications as directed. Please refer to the Current Medication list given to you today.  *If you need a refill on your cardiac medications before your next appointment, please call your pharmacy*   Lab Work: None If you have labs (blood work) drawn today and your tests are completely normal, you will receive your results only by: [name] MyChart Message (if you have MyChart) OR . A paper copy in the mail If you have any lab test that is abnormal or we need to change your treatment, we will call you to review the results.   Testing/Procedures:    [place] [address] [place], [place] [address] Dept: [phone] Loc: [phone]  Isak Sotomayor  10/06/2020  You are scheduled for a Cardiac Catheterization on Thursday, April 14 with Dr. Shelva Majestic.  1. Please arrive at the [facility] (Main Entrance A) at [facility]: [address] [place], [place] [address] at 5:30 AM (This time is two hours before your procedure to ensure your preparation). Free valet parking service is available.   Special note: Every effort is made to have your procedure done on time. Please understand that emergencies sometimes delay scheduled procedures.  2. Diet: Do not eat solid foods after midnight.  The patient may have clear liquids until 5am upon the day of the procedure.  3. Labs: Have been completed  4. Medication instructions in preparation for your procedure:   Contrast Allergy: No  On the morning of your procedure, take your Aspirin and any morning medicines NOT listed above.  You may use sips of water.  5. Plan for one night stay--bring personal belongings. 6. Bring a current list of your medications and current insurance cards. 7. You MUST have a responsible person to drive you home. 8. Someone MUST be  with you the first 24 hours after you arrive home or your discharge will be delayed. 9. Please wear clothes that are easy to get on and off and wear slip-on shoes.  Thank you for allowing [place] to care for you!   -- [place] [facility]    Follow-Up: At [facility], you and your health needs are our priority.  As part of our continuing mission to provide you with exceptional heart care, we have created designated Provider Care Teams.  These Care Teams include your primary Cardiologist (physician) and Advanced Practice Providers (APPs -  Physician Assistants and Nurse Practitioners) who all work together to provide you with the care you need, when you need it.  We recommend signing up for the patient portal called "MyChart".  Sign up information is provided on this After Visit Summary.  MyChart is used to connect with patients for Virtual Visits (Telemedicine).  Patients are able to view lab/test results, encounter notes, upcoming appointments, etc.  Non-urgent messages can be sent to your provider as well.   To learn more about what you can do with MyChart, go to [email or web address].    Your next appointment:   4 week(s)  The format for your next appointment:   In Person  Provider:   Shirlee More, MD   Other Instructions

## 2020-10-07 ENCOUNTER — Ambulatory Visit (HOSPITAL_COMMUNITY)
Admission: RE | Admit: 2020-10-07 | Discharge: 2020-10-07 | Disposition: A | Discharge status: Home / Self Care | Payer: Medicare HMO | Attending: Cardiovascular Disease | Admitting: Cardiovascular Disease

## 2020-10-07 ENCOUNTER — Ambulatory Visit (HOSPITAL_COMMUNITY)
Admission: RE | Disposition: A | Discharge status: Home / Self Care | Payer: Self-pay | Source: Home / Self Care | Attending: Cardiovascular Disease

## 2020-10-07 DIAGNOSIS — E119 Type 2 diabetes mellitus without complications: Secondary | ICD-10-CM | POA: Insufficient documentation

## 2020-10-07 DIAGNOSIS — I119 Hypertensive heart disease without heart failure: Secondary | ICD-10-CM | POA: Insufficient documentation

## 2020-10-07 DIAGNOSIS — I2582 Chronic total occlusion of coronary artery: Secondary | ICD-10-CM | POA: Diagnosis not present

## 2020-10-07 DIAGNOSIS — Z7982 Long term (current) use of aspirin: Secondary | ICD-10-CM | POA: Diagnosis not present

## 2020-10-07 DIAGNOSIS — E669 Obesity, unspecified: Secondary | ICD-10-CM | POA: Insufficient documentation

## 2020-10-07 DIAGNOSIS — E785 Hyperlipidemia, unspecified: Secondary | ICD-10-CM | POA: Diagnosis not present

## 2020-10-07 DIAGNOSIS — Z79899 Other long term (current) drug therapy: Secondary | ICD-10-CM | POA: Diagnosis not present

## 2020-10-07 DIAGNOSIS — Z6830 Body mass index (BMI) 30.0-30.9, adult: Secondary | ICD-10-CM | POA: Insufficient documentation

## 2020-10-07 DIAGNOSIS — Z7984 Long term (current) use of oral hypoglycemic drugs: Secondary | ICD-10-CM | POA: Diagnosis not present

## 2020-10-07 DIAGNOSIS — R931 Abnormal findings on diagnostic imaging of heart and coronary circulation: Secondary | ICD-10-CM | POA: Diagnosis not present

## 2020-10-07 DIAGNOSIS — I25118 Atherosclerotic heart disease of native coronary artery with other forms of angina pectoris: Secondary | ICD-10-CM

## 2020-10-07 HISTORY — PX: LEFT HEART CATH AND CORONARY ANGIOGRAPHY: CATH118249

## 2020-10-07 LAB — GLUCOSE, CAPILLARY
Glucose-Capillary: 209 mg/dL — ABNORMAL HIGH (ref 70–99)
Glucose-Capillary: 132 mg/dL — ABNORMAL HIGH (ref 70–99)

## 2020-10-07 SURGERY — LEFT HEART CATH AND CORONARY ANGIOGRAPHY
Anesthesia: LOCAL

## 2020-10-07 MED ORDER — SODIUM CHLORIDE 0.9 % IV SOLN: INTRAVENOUS | Status: DC

## 2020-10-07 MED ORDER — HEPARIN SODIUM (PORCINE) 1000 UNIT/ML IJ SOLN
INTRAMUSCULAR | Status: AC
  Filled 2020-10-07: qty 1

## 2020-10-07 MED ORDER — LIDOCAINE HCL (PF) 1 % IJ SOLN
INTRAMUSCULAR | Status: AC
  Filled 2020-10-07: qty 30

## 2020-10-07 MED ORDER — LIDOCAINE HCL (PF) 1 % IJ SOLN
INTRAMUSCULAR | Status: DC | PRN
  Administered 2020-10-07: 15 mL

## 2020-10-07 MED ORDER — FENTANYL CITRATE (PF) 100 MCG/2ML IJ SOLN
INTRAMUSCULAR | Status: AC
  Filled 2020-10-07: qty 2

## 2020-10-07 MED ORDER — MIDAZOLAM HCL 2 MG/2ML IJ SOLN
INTRAMUSCULAR | Status: AC
  Filled 2020-10-07: qty 2

## 2020-10-07 MED ORDER — FENTANYL CITRATE (PF) 100 MCG/2ML IJ SOLN
INTRAMUSCULAR | Status: DC | PRN
  Administered 2020-10-07: 50 ug via INTRAVENOUS

## 2020-10-07 MED ORDER — VERAPAMIL HCL 2.5 MG/ML IV SOLN
INTRAVENOUS | Status: AC
  Filled 2020-10-07: qty 2

## 2020-10-07 MED ORDER — ASPIRIN 81 MG PO CHEW
81.0000 mg | CHEWABLE_TABLET | ORAL | Status: DC
Start: 2020-10-08 — End: 2020-10-07

## 2020-10-07 MED ORDER — SODIUM CHLORIDE 0.9 % WEIGHT BASED INFUSION
3.0000 mL/kg/h | INTRAVENOUS | Status: AC
  Administered 2020-10-07: 3 mL/kg/h via INTRAVENOUS

## 2020-10-07 MED ORDER — MIDAZOLAM HCL 2 MG/2ML IJ SOLN
INTRAMUSCULAR | Status: DC | PRN
  Administered 2020-10-07: 2 mg via INTRAVENOUS

## 2020-10-07 MED ORDER — SODIUM CHLORIDE 0.9% FLUSH
3.0000 mL | Freq: Two times a day (BID) | INTRAVENOUS | Status: DC
Start: 2020-10-07 — End: 2020-10-07

## 2020-10-07 MED ORDER — SODIUM CHLORIDE 0.9% FLUSH
3.0000 mL | INTRAVENOUS | Status: DC | PRN
Start: 2020-10-07 — End: 2020-10-07

## 2020-10-07 MED ORDER — HEPARIN (PORCINE) IN NACL 1000-0.9 UT/500ML-% IV SOLN
INTRAVENOUS | Status: AC
  Filled 2020-10-07: qty 1000

## 2020-10-07 MED ORDER — SODIUM CHLORIDE 0.9 % WEIGHT BASED INFUSION: 1.0000 mL/kg/h | INTRAVENOUS | Status: DC

## 2020-10-07 MED ORDER — IOHEXOL 350 MG/ML SOLN
INTRAVENOUS | Status: DC | PRN
  Administered 2020-10-07: 100 mL

## 2020-10-07 MED ORDER — SODIUM CHLORIDE 0.9 % IV SOLN: 250.0000 mL | INTRAVENOUS | Status: DC | PRN

## 2020-10-07 MED ORDER — SODIUM CHLORIDE 0.9 % IV SOLN
INTRAVENOUS | Status: AC | PRN
  Administered 2020-10-07: 1000 mL

## 2020-10-07 SURGICAL SUPPLY — 13 items
CATH INFINITI 5 FR 3DRC (CATHETERS) ×2 IMPLANT
CATH INFINITI 5 FR AR1 MOD (CATHETERS) ×2 IMPLANT
CATH INFINITI 5FR AL1 (CATHETERS) ×2 IMPLANT
CATH INFINITI 5FR MULTPACK ANG (CATHETERS) ×2 IMPLANT
GLIDESHEATH SLEND SS 6F .021 (SHEATH) IMPLANT
GUIDEWIRE INQWIRE 1.5J.035X260 (WIRE) IMPLANT
INQWIRE 1.5J .035X260CM (WIRE)
KIT HEART LEFT (KITS) ×2 IMPLANT
PACK CARDIAC CATHETERIZATION (CUSTOM PROCEDURE TRAY) ×2 IMPLANT
SHEATH PINNACLE 5F 10CM (SHEATH) ×2 IMPLANT
TRANSDUCER W/STOPCOCK (MISCELLANEOUS) ×2 IMPLANT
TUBING CIL FLEX 10 FLL-RA (TUBING) ×2 IMPLANT
WIRE EMERALD 3MM-J .035X150CM (WIRE) ×2 IMPLANT

## 2020-10-07 NOTE — Progress Notes (Signed)
Patient in holding area, case held for another emergency case. Patient without complaints at this time. Will continue to monitor.

## 2020-10-07 NOTE — Progress Notes (Signed)
TCTS consulted for outpatient CABG evaluation. TCTS office will contact patient with an appointment.

## 2020-10-07 NOTE — Interval H&P Note (Signed)
Cath Lab Visit (complete for each Cath Lab visit)  Clinical Evaluation Leading to the Procedure:   ACS: No.  Non-ACS:    Anginal Classification: CCS II  Anti-ischemic medical therapy: Maximal Therapy (2 or more classes of medications)  Non-Invasive Test Results: Intermediate-risk stress test findings: cardiac mortality 1-3%/year  Prior CABG: No previous CABG      History and Physical Interval Note:  10/07/2020 12:55 PM  Daniel Gibson  has presented today for surgery, with the diagnosis of CAD.  The various methods of treatment have been discussed with the patient and family. After consideration of risks, benefits and other options for treatment, the patient has consented to  Procedure(s): LEFT HEART CATH AND CORONARY ANGIOGRAPHY (N/A) as a surgical intervention.  The patient's history has been reviewed, patient examined, no change in status, stable for surgery.  I have reviewed the patient's chart and labs.  Questions were answered to the patient's satisfaction.     Shelva Majestic

## 2020-10-07 NOTE — Progress Notes (Signed)
Site area: rt groin arterial site Site Prior to Removal:  Level 0 Pressure Applied For: 20 minutes Manual:   yes Patient Status During Pull:  stable Post Pull Site:  Level 0 Post Pull Instructions Given:  yes Post Pull Pulses Present: rt dp palpable Dressing Applied:  Gauze and tegaderm Bedrest begins @ 1430 Comments:

## 2020-10-07 NOTE — Discharge Instructions (Signed)
NO METFORMIN FOR 2 DAYS    Femoral Site Care  This sheet gives you information about how to care for yourself after your procedure. Your health care provider may also give you more specific instructions. If you have problems or questions, contact your health care provider. What can I expect after the procedure? After the procedure, it is common to have:  Bruising that usually fades within 1-2 weeks.  Tenderness at the site. Follow these instructions at home: Wound care  Follow instructions from your health care provider about how to take care of your insertion site. Make sure you: ? Wash your hands with soap and water before you change your bandage (dressing). If soap and water are not available, use hand sanitizer. ? Change your dressing as told by your health care provider. ? Leave stitches (sutures), skin glue, or adhesive strips in place. These skin closures may need to stay in place for 2 weeks or longer. If adhesive strip edges start to loosen and curl up, you may trim the loose edges. Do not remove adhesive strips completely unless your health care provider tells you to do that.  Do not take baths, swim, or use a hot tub until your health care provider approves.  You may shower 24-48 hours after the procedure or as told by your health care provider. ? Gently wash the site with plain soap and water. ? Pat the area dry with a clean towel. ? Do not rub the site. This may cause bleeding.  Do not apply powder or lotion to the site. Keep the site clean and dry.  Check your femoral site every day for signs of infection. Check for: ? Redness, swelling, or pain. ? Fluid or blood. ? Warmth. ? Pus or a bad smell. Activity  For the first 2-3 days after your procedure, or as long as directed: ? Avoid climbing stairs as much as possible. ? Do not squat.  Do not lift anything that is heavier than 10 lb (4.5 kg), or the limit that you are told, until your health care provider says  that it is safe.  Rest as directed. ? Avoid sitting for a long time without moving. Get up to take short walks every 1-2 hours.  Do not drive for 24 hours if you were given a medicine to help you relax (sedative). General instructions  Take over-the-counter and prescription medicines only as told by your health care provider.  Keep all follow-up visits as told by your health care provider. This is important. Contact a health care provider if you have:  A fever or chills.  You have redness, swelling, or pain around your insertion site. Get help right away if:  The catheter insertion area swells very fast.  You pass out.  You suddenly start to sweat or your skin gets clammy.  The catheter insertion area is bleeding, and the bleeding does not stop when you hold steady pressure on the area.  The area near or just beyond the catheter insertion site becomes pale, cool, tingly, or numb. These symptoms may represent a serious problem that is an emergency. Do not wait to see if the symptoms will go away. Get medical help right away. Call your local emergency services (911 in the [place] Do not drive yourself to the hospital. Summary  After the procedure, it is common to have bruising that usually fades within 1-2 weeks.  Check your femoral site every day for signs of infection.  Do not lift anything that  is heavier than 10 lb (4.5 kg), or the limit that you are told, until your health care provider says that it is safe. This information is not intended to replace advice given to you by your health care provider. Make sure you discuss any questions you have with your health care provider. Document Revised: 02/13/2020 Document Reviewed: 02/13/2020 Elsevier Patient Education  [place].

## 2020-10-08 ENCOUNTER — Encounter (HOSPITAL_COMMUNITY): Payer: Self-pay | Admitting: Cardiovascular Disease

## 2020-10-08 MED FILL — Heparin Sod (Porcine)-NaCl IV Soln 1000 Unit/500ML-0.9%: INTRAVENOUS | Qty: 1000 | Status: AC

## 2020-10-08 MED FILL — Heparin Sodium (Porcine) Inj 1000 Unit/ML: INTRAMUSCULAR | Qty: 10 | Status: AC

## 2020-10-08 MED FILL — Lidocaine HCl Local Preservative Free (PF) Inj 1%: INTRAMUSCULAR | Qty: 30 | Status: AC

## 2020-10-08 MED FILL — Verapamil HCl IV Soln 2.5 MG/ML: INTRAVENOUS | Qty: 2 | Status: AC

## 2020-10-11 ENCOUNTER — Telehealth: Payer: Self-pay | Admitting: Cardiology

## 2020-10-11 NOTE — Telephone Encounter (Signed)
Left message on patients voicemail to please return our call.   

## 2020-10-11 NOTE — Telephone Encounter (Signed)
New message:    Patient had a Heart CATH last week, patient is requesting to speak with Dr. Bettina Gavia.

## 2020-10-12 NOTE — Telephone Encounter (Signed)
Spoke to the patient just now and he let me know that his question is in regards to his heart catheterization that he had done on 10/07/20. He states that during this he was told that stents were not an option and the doctor recommends open heart surgery. He is scheduled to see Dr. Orvan Seen with CT surgery tomorrow at 3 pm.   He would really like Dr. Joya Gaskins opinion on this matter. He requests to speak with Dr. Bettina Gavia directly but I advised that he is rounding at the hospital this week and is unable to take his direct call at this time.   I will route to Dr. Bettina Gavia at this time.

## 2020-10-13 ENCOUNTER — Encounter: Payer: Medicare HMO | Admitting: Cardiothoracic Surgery

## 2020-10-13 NOTE — Telephone Encounter (Signed)
At his request to review the coronary angiogram films called the patient and reviewed the results.  He has multivessel CAD diabetic and I think Dr. Claiborne Billings showed good judgment advising CT surgery consultation.  His son works at [employer] has been encouraging his father to go there but he prefers having his care at [facility] and I affirmed with him my faith in the CT surgery team.  His expectation is that he needs coronary artery bypass surgery.

## 2020-10-14 ENCOUNTER — Other Ambulatory Visit: Payer: Self-pay

## 2020-10-14 ENCOUNTER — Institutional Professional Consult (permissible substitution): Payer: Medicare HMO | Admitting: Cardiothoracic Surgery

## 2020-10-14 ENCOUNTER — Other Ambulatory Visit: Payer: Self-pay | Admitting: Cardiothoracic Surgery

## 2020-10-14 VITALS — BP 150/80 | HR 70 | Temp 97.7°F | Resp 20 | Ht 70.0 in | Wt 210.0 lb

## 2020-10-14 DIAGNOSIS — I25119 Atherosclerotic heart disease of native coronary artery with unspecified angina pectoris: Secondary | ICD-10-CM

## 2020-10-14 DIAGNOSIS — I2510 Atherosclerotic heart disease of native coronary artery without angina pectoris: Secondary | ICD-10-CM

## 2020-10-14 NOTE — Progress Notes (Signed)
Surgical Instructions    Your procedure is scheduled on Monday, April 25,2022.  Report to [facility] Main Entrance "A" at 0530 A.M., then check in with the Admitting office.  Call this number if you have problems the morning of surgery:  [phone]   If you have any questions prior to your surgery date call [phone]: Open Monday-Friday 8am-4pm    Remember:  Do not eat or drink after midnight the night before your surgery     Take these medicines the morning of surgery with A SIP OF WATER :  Amlodipine(Norvasc) Carvedilol(Coreg) Ezetimibe(Zetia) Rosuvastaatin(Crestor)  If needed,you may take: Acetaminophen(Tylenol) Fluticasone(Flonase) Nitrogylcerin(Nitrostat)  As of today, STOP taking any Aspirin (unless otherwise instructed by your surgeon) Aleve, Naproxen, Ibuprofen, Motrin, Advil, Goody's, BC's, all herbal medications, fish oil, and all vitamins.  Follow your surgeon's instructions on when to stop Aspirin.  If no instructions were given by your surgeon then you will need to call the office to get those instructions.     WHAT DO I DO ABOUT MY DIABETES MEDICATION?  [name] Do not take oral diabetes medicines (pills) the morning of surgery..   HOW TO MANAGE YOUR DIABETES BEFORE AND AFTER SURGERY  Why is it important to control my blood sugar before and after surgery? . Improving blood sugar levels before and after surgery helps healing and can limit problems. . A way of improving blood sugar control is eating a healthy diet by: o  Eating less sugar and carbohydrates o  Increasing activity/exercise o  Talking with your doctor about reaching your blood sugar goals . High blood sugars (greater than 180 mg/dL) can raise your risk of infections and slow your recovery, so you will need to focus on controlling your diabetes during the weeks before surgery. . Make sure that the doctor who takes care of your diabetes knows about your planned surgery including the date and  location.  How do I manage my blood sugar before surgery? . Check your blood sugar at least 4 times a day, starting 2 days before surgery, to make sure that the level is not too high or low. . Check your blood sugar the morning of your surgery when you wake up and every 2 hours until you get to the Short Stay unit. o If your blood sugar is less than 70 mg/dL, you will need to treat for low blood sugar: - Do not take insulin. - Treat a low blood sugar (less than 70 mg/dL) with  cup of clear juice (cranberry or apple), 4 glucose tablets, OR glucose gel. - Recheck blood sugar in 15 minutes after treatment (to make sure it is greater than 70 mg/dL). If your blood sugar is not greater than 70 mg/dL on recheck, call [phone] for further instructions. . Report your blood sugar to the short stay nurse when you get to Short Stay.  . If you are admitted to the hospital after surgery: o Your blood sugar will be checked by the staff and you will probably be given insulin after surgery (instead of oral diabetes medicines) to make sure you have good blood sugar levels. o The goal for blood sugar control after surgery is 80-180 mg/dL.                     Do not wear jewelry, make up, or nail polish            Do not wear lotions, powders, perfumes/colognes, or deodorant.  Do not shave 48 hours prior to surgery.  Men may shave face and neck.            Do not bring valuables to the hospital.            [facility] is not responsible for any belongings or valuables.  Do NOT Smoke (Tobacco/Vaping) or drink Alcohol 24 hours prior to your procedure If you use a CPAP at night, you may bring all equipment for your overnight stay.   Contacts, glasses, dentures or bridgework may not be worn into surgery, please bring cases for these belongings   For patients admitted to the hospital, discharge time will be determined by your treatment team.   Patients discharged the day of surgery will not be  allowed to drive home, and someone needs to stay with them for 24 hours.    Special instructions:   [place]- Preparing For Surgery  Before surgery, you can play an important role. Because skin is not sterile, your skin needs to be as free of germs as possible. You can reduce the number of germs on your skin by washing with CHG (chlorahexidine gluconate) Soap before surgery.  CHG is an antiseptic cleaner which kills germs and bonds with the skin to continue killing germs even after washing.    Oral Hygiene is also important to reduce your risk of infection.  Remember - BRUSH YOUR TEETH THE MORNING OF SURGERY WITH YOUR REGULAR TOOTHPASTE  Please do not use if you have an allergy to CHG or antibacterial soaps. If your skin becomes reddened/irritated stop using the CHG.  Do not shave (including legs and underarms) for at least 48 hours prior to first CHG shower. It is OK to shave your face.  Please follow these instructions carefully.   1. Shower the NIGHT BEFORE SURGERY and the MORNING OF SURGERY  2. If you chose to wash your hair, wash your hair first as usual with your normal shampoo.  3. After you shampoo, rinse your hair and body thoroughly to remove the shampoo.  4. Wash Face and genitals (private parts) with your normal soap.   5.  Shower the NIGHT BEFORE SURGERY and the MORNING OF SURGERY with CHG Soap.   6. Use CHG Soap as you would any other liquid soap. You can apply CHG directly to the skin and wash gently with a scrungie or a clean washcloth.   7. Apply the CHG Soap to your body ONLY FROM THE NECK DOWN.  Do not use on open wounds or open sores. Avoid contact with your eyes, ears, mouth and genitals (private parts). Wash Face and genitals (private parts)  with your normal soap.   8. Wash thoroughly, paying special attention to the area where your surgery will be performed.  9. Thoroughly rinse your body with warm water from the neck down.  10. DO NOT shower/wash with  your normal soap after using and rinsing off the CHG Soap.  11. Pat yourself dry with a CLEAN TOWEL.  12. Wear CLEAN PAJAMAS to bed the night before surgery  13. Place CLEAN SHEETS on your bed the night before your surgery  14. DO NOT SLEEP WITH PETS.   Day of Surgery: Take a shower with CHG soap. Wear Clean/Comfortable clothing the morning of surgery Do not apply any deodorants/lotions.   Remember to brush your teeth WITH YOUR REGULAR TOOTHPASTE.   Please read over the following fact sheets that you were given.

## 2020-10-15 ENCOUNTER — Other Ambulatory Visit (HOSPITAL_COMMUNITY): Payer: Medicare HMO

## 2020-10-15 ENCOUNTER — Ambulatory Visit (HOSPITAL_BASED_OUTPATIENT_CLINIC_OR_DEPARTMENT_OTHER)
Admission: RE | Admit: 2020-10-15 | Discharge: 2020-10-15 | Disposition: A | Discharge status: Home / Self Care | Payer: Medicare HMO | Source: Ambulatory Visit | Attending: Cardiothoracic Surgery | Admitting: Cardiothoracic Surgery

## 2020-10-15 ENCOUNTER — Encounter (HOSPITAL_COMMUNITY)
Admission: RE | Admit: 2020-10-15 | Discharge: 2020-10-15 | Disposition: A | Discharge status: Home / Self Care | Payer: Medicare HMO | Source: Ambulatory Visit | Attending: Cardiothoracic Surgery | Admitting: Cardiothoracic Surgery

## 2020-10-15 ENCOUNTER — Encounter (HOSPITAL_COMMUNITY): Payer: Self-pay

## 2020-10-15 ENCOUNTER — Other Ambulatory Visit: Payer: Self-pay

## 2020-10-15 ENCOUNTER — Ambulatory Visit (HOSPITAL_COMMUNITY)
Admission: RE | Admit: 2020-10-15 | Discharge: 2020-10-15 | Disposition: A | Discharge status: Home / Self Care | Payer: Medicare HMO | Source: Ambulatory Visit | Attending: Cardiothoracic Surgery | Admitting: Cardiothoracic Surgery

## 2020-10-15 ENCOUNTER — Encounter (HOSPITAL_COMMUNITY): Payer: Self-pay | Admitting: Cardiothoracic Surgery

## 2020-10-15 DIAGNOSIS — I25119 Atherosclerotic heart disease of native coronary artery with unspecified angina pectoris: Secondary | ICD-10-CM

## 2020-10-15 DIAGNOSIS — Z01818 Encounter for other preprocedural examination: Secondary | ICD-10-CM | POA: Insufficient documentation

## 2020-10-15 DIAGNOSIS — E119 Type 2 diabetes mellitus without complications: Secondary | ICD-10-CM | POA: Insufficient documentation

## 2020-10-15 DIAGNOSIS — E785 Hyperlipidemia, unspecified: Secondary | ICD-10-CM | POA: Insufficient documentation

## 2020-10-15 DIAGNOSIS — I252 Old myocardial infarction: Secondary | ICD-10-CM | POA: Insufficient documentation

## 2020-10-15 DIAGNOSIS — Z01811 Encounter for preprocedural respiratory examination: Secondary | ICD-10-CM

## 2020-10-15 DIAGNOSIS — Z20822 Contact with and (suspected) exposure to covid-19: Secondary | ICD-10-CM | POA: Insufficient documentation

## 2020-10-15 DIAGNOSIS — I10 Essential (primary) hypertension: Secondary | ICD-10-CM | POA: Insufficient documentation

## 2020-10-15 DIAGNOSIS — I779 Disorder of arteries and arterioles, unspecified: Secondary | ICD-10-CM | POA: Insufficient documentation

## 2020-10-15 DIAGNOSIS — I700 Atherosclerosis of aorta: Secondary | ICD-10-CM | POA: Diagnosis not present

## 2020-10-15 HISTORY — DX: Atherosclerotic heart disease of native coronary artery without angina pectoris: I25.10

## 2020-10-15 LAB — CBC
HCT: 40.9 % (ref 39.0–52.0)
Hemoglobin: 14.9 g/dL (ref 13.0–17.0)
MCH: 32 pg (ref 26.0–34.0)
MCHC: 36.4 g/dL — ABNORMAL HIGH (ref 30.0–36.0)
MCV: 88 fL (ref 80.0–100.0)
Platelets: 186 10*3/uL (ref 150–400)
RBC: 4.65 MIL/uL (ref 4.22–5.81)
RDW: 11.7 % (ref 11.5–15.5)
WBC: 8.9 10*3/uL (ref 4.0–10.5)
nRBC: 0 % (ref 0.0–0.2)

## 2020-10-15 LAB — GLUCOSE, CAPILLARY: Glucose-Capillary: 293 mg/dL — ABNORMAL HIGH (ref 70–99)

## 2020-10-15 LAB — URINALYSIS, ROUTINE W REFLEX MICROSCOPIC
Bacteria, UA: NONE SEEN
Bilirubin Urine: NEGATIVE
Glucose, UA: >=500 mg/dL — AB
Ketones, ur: NEGATIVE mg/dL
Leukocytes,Ua: NEGATIVE
Nitrite: NEGATIVE
Protein, ur: 30 mg/dL — AB
Specific Gravity, Urine: 1.013 (ref 1.005–1.030)
pH: 6 (ref 5.0–8.0)

## 2020-10-15 LAB — BLOOD GAS, ARTERIAL
Acid-base deficit: 2.3 mmol/L — ABNORMAL HIGH (ref 0.0–2.0)
Bicarbonate: 21.9 mmol/L (ref 20.0–28.0)
FIO2: 21
O2 Saturation: 98.9 %
Patient temperature: 37
pCO2 arterial: 37.1 mmHg (ref 32.0–48.0)
pH, Arterial: 7.389 (ref 7.350–7.450)
pO2, Arterial: 126 mmHg — ABNORMAL HIGH (ref 83.0–108.0)

## 2020-10-15 LAB — COMPREHENSIVE METABOLIC PANEL
ALT: 15 U/L (ref 0–44)
AST: 15 U/L (ref 15–41)
Albumin: 3.6 g/dL (ref 3.5–5.0)
Alkaline Phosphatase: 64 U/L (ref 38–126)
Anion gap: 9 (ref 5–15)
BUN: 10 mg/dL (ref 8–23)
CO2: 19 mmol/L — ABNORMAL LOW (ref 22–32)
Calcium: 8.9 mg/dL (ref 8.9–10.3)
Chloride: 106 mmol/L (ref 98–111)
Creatinine, Ser: 1.21 mg/dL (ref 0.61–1.24)
GFR, Estimated: >60 mL/min (ref >60)
Glucose, Bld: 309 mg/dL — ABNORMAL HIGH (ref 70–99)
Potassium: 4.1 mmol/L (ref 3.5–5.1)
Sodium: 134 mmol/L — ABNORMAL LOW (ref 135–145)
Total Bilirubin: 0.7 mg/dL (ref 0.3–1.2)
Total Protein: 6 g/dL — ABNORMAL LOW (ref 6.5–8.1)

## 2020-10-15 LAB — ECHOCARDIOGRAM COMPLETE
Area-P 1/2: 3.21 cm2
Height: 70 in
S' Lateral: 3.6 cm
Single Plane A4C EF: 48.9 %
Weight: 3302.4 oz

## 2020-10-15 LAB — HEMOGLOBIN A1C
Hgb A1c MFr Bld: 8.1 % — ABNORMAL HIGH (ref 4.8–5.6)
Mean Plasma Glucose: 185.77 mg/dL

## 2020-10-15 LAB — SARS CORONAVIRUS 2 (TAT 6-24 HRS): SARS Coronavirus 2: NEGATIVE

## 2020-10-15 LAB — PROTIME-INR
INR: 1.1 (ref 0.8–1.2)
Prothrombin Time: 13.8 seconds (ref 11.4–15.2)

## 2020-10-15 LAB — TYPE AND SCREEN
ABO/RH(D): A POS
Antibody Screen: NEGATIVE

## 2020-10-15 LAB — SURGICAL PCR SCREEN
MRSA, PCR: NEGATIVE
Staphylococcus aureus: POSITIVE — AB

## 2020-10-15 LAB — APTT: aPTT: 26 seconds (ref 24–36)

## 2020-10-15 MED ORDER — TRANEXAMIC ACID (OHS) PUMP PRIME SOLUTION
2.0000 mg/kg | INTRAVENOUS | Status: DC
  Filled 2020-10-15: qty 1.87

## 2020-10-15 MED ORDER — TRANEXAMIC ACID 1000 MG/10ML IV SOLN
1.5000 mg/kg/h | INTRAVENOUS | Status: AC
  Administered 2020-10-18: 1.5 mg/kg/h via INTRAVENOUS
  Filled 2020-10-15: qty 25

## 2020-10-15 MED ORDER — EPINEPHRINE HCL 5 MG/250ML IV SOLN IN NS
0.0000 ug/min | INTRAVENOUS | Status: DC
  Filled 2020-10-15: qty 250

## 2020-10-15 MED ORDER — MAGNESIUM SULFATE 50 % IJ SOLN
40.0000 meq | INTRAMUSCULAR | Status: DC
  Filled 2020-10-15: qty 9.85

## 2020-10-15 MED ORDER — MILRINONE LACTATE IN DEXTROSE 20-5 MG/100ML-% IV SOLN
0.3000 ug/kg/min | INTRAVENOUS | Status: DC
  Filled 2020-10-15: qty 100

## 2020-10-15 MED ORDER — SODIUM CHLORIDE 0.9 % IV SOLN
1.5000 g | INTRAVENOUS | Status: AC
  Administered 2020-10-18: 1.5 g via INTRAVENOUS
  Filled 2020-10-15: qty 1.5

## 2020-10-15 MED ORDER — NITROGLYCERIN IN D5W 200-5 MCG/ML-% IV SOLN
2.0000 ug/min | INTRAVENOUS | Status: DC
  Filled 2020-10-15: qty 250

## 2020-10-15 MED ORDER — VANCOMYCIN HCL 1500 MG/300ML IV SOLN
1500.0000 mg | INTRAVENOUS | Status: AC
  Administered 2020-10-18: 1500 mg via INTRAVENOUS
  Filled 2020-10-15: qty 300

## 2020-10-15 MED ORDER — SODIUM CHLORIDE 0.9 % IV SOLN
INTRAVENOUS | Status: DC
  Filled 2020-10-15: qty 30

## 2020-10-15 MED ORDER — NOREPINEPHRINE 4 MG/250ML-% IV SOLN
0.0000 ug/min | INTRAVENOUS | Status: DC
  Filled 2020-10-15: qty 250

## 2020-10-15 MED ORDER — POTASSIUM CHLORIDE 2 MEQ/ML IV SOLN
80.0000 meq | INTRAVENOUS | Status: DC
  Filled 2020-10-15: qty 40

## 2020-10-15 MED ORDER — DEXMEDETOMIDINE HCL IN NACL 400 MCG/100ML IV SOLN
0.1000 ug/kg/h | INTRAVENOUS | Status: AC
  Administered 2020-10-18: .4 ug/kg/h via INTRAVENOUS
  Filled 2020-10-15: qty 100

## 2020-10-15 MED ORDER — PHENYLEPHRINE HCL-NACL 20-0.9 MG/250ML-% IV SOLN
30.0000 ug/min | INTRAVENOUS | Status: AC
  Administered 2020-10-18: 10 ug/min via INTRAVENOUS
  Filled 2020-10-15: qty 250

## 2020-10-15 MED ORDER — INSULIN REGULAR(HUMAN) IN NACL 100-0.9 UT/100ML-% IV SOLN
INTRAVENOUS | Status: AC
  Administered 2020-10-18: 10.5 [IU]/h via INTRAVENOUS
  Filled 2020-10-15: qty 100

## 2020-10-15 MED ORDER — TRANEXAMIC ACID (OHS) BOLUS VIA INFUSION
15.0000 mg/kg | INTRAVENOUS | Status: AC
  Administered 2020-10-18: 1404 mg via INTRAVENOUS
  Filled 2020-10-15: qty 1404

## 2020-10-15 MED ORDER — PLASMA-LYTE 148 IV SOLN
INTRAVENOUS | Status: DC
  Filled 2020-10-15 (×2): qty 2.5

## 2020-10-15 MED ORDER — SODIUM CHLORIDE 0.9 % IV SOLN
750.0000 mg | INTRAVENOUS | Status: AC
  Administered 2020-10-18: 750 mg via INTRAVENOUS
  Filled 2020-10-15: qty 750

## 2020-10-15 NOTE — Progress Notes (Addendum)
PCP - Cyndi Bender PA Cardiologist - Shirlee More  PPM/ICD -denies  Device Orders -  Rep Notified -   Chest x-ray - 10/15/2020 EKG -10/06/2020  Stress Test - denies ECHO - 10/15/2020 Cardiac Cath - 10/07/2020  Sleep Study - denies CPAP -n/a  Fasting Blood Sugar -Pt unsure. He doesn't check his blood sugar  Checks Blood Sugar ___0__ times a day Pt's CBG 293 on arrival to PAT appt. Pt ate apple turnover before coming to appt.. He stated he took his metformin this am. Educated pt about the importance of blood sugar control especially in regards to healing after surgery. Pt says he is compliant with taking his Metformin.  Blood Thinner Instructions:na Aspirin Instructions:Instructed to continue.Do not take DOS  ERAS Protcol -na PRE-SURGERY Ensure or G2-na   COVID TEST- 10/15/2020   Anesthesia review: yes for cardiac history  Patient denies shortness of breath, fever, cough and chest pain at PAT appointment   All instructions explained to the patient, with a verbal understanding of the material. Patient agrees to go over the instructions while at home for a better understanding. Patient also instructed to self quarantine after being tested for COVID-19. The opportunity to ask questions was provided.

## 2020-10-15 NOTE — Progress Notes (Signed)
Pre-CABG study completed.  ° °Please see CV Proc for preliminary results.  ° °Jex Strausbaugh, RDMS, RVT ° °

## 2020-10-15 NOTE — Progress Notes (Signed)
  Echocardiogram 2D Echocardiogram has been performed.  Daniel Gibson 10/15/2020, 10:36 AM

## 2020-10-17 NOTE — Anesthesia Preprocedure Evaluation (Addendum)
Anesthesia Evaluation  Patient identified by MRN, date of birth, ID band Patient awake    Reviewed: Allergy & Precautions, NPO status , Patient's Chart, lab work & pertinent test results  Airway Mallampati: II  TM Distance: >3 FB Neck ROM: Full    Dental  (+) Teeth Intact   Pulmonary neg pulmonary ROS,    Pulmonary exam normal        Cardiovascular hypertension, Pt. on medications and Pt. on home beta blockers + CAD and + Past MI (1993)   Rhythm:Regular Rate:Normal     Neuro/Psych negative neurological ROS  negative psych ROS   GI/Hepatic negative GI ROS, Neg liver ROS,   Endo/Other  diabetes, Type 2, Oral Hypoglycemic Agents  Renal/GU negative Renal ROS   Prostate Ca    Musculoskeletal negative musculoskeletal ROS (+)   Abdominal (+)  Abdomen: soft. Bowel sounds: normal.  Peds  Hematology negative hematology ROS (+)   Anesthesia Other Findings   Reproductive/Obstetrics                            Anesthesia Physical Anesthesia Plan  ASA: III  Anesthesia Plan: General   Post-op Pain Management:    Induction: Intravenous  PONV Risk Score and Plan: 2 and Midazolam and Treatment may vary due to age or medical condition  Airway Management Planned: Mask and Oral ETT  Additional Equipment: Arterial line, CVP, PA Cath, TEE, 3D TEE and Ultrasound Guidance Line Placement  Intra-op Plan:   Post-operative Plan: Post-operative intubation/ventilation  Informed Consent: I have reviewed the patients History and Physical, chart, labs and discussed the procedure including the risks, benefits and alternatives for the proposed anesthesia with the patient or authorized representative who has indicated his/her understanding and acceptance.     Dental advisory given  Plan Discussed with: CRNA  Anesthesia Plan Comments: (Lab Results      Component                Value               Date                       WBC                      8.9                 10/15/2020                HGB                      14.9                10/15/2020                HCT                      40.9                10/15/2020                MCV                      88.0                10/15/2020  PLT                      186                 10/15/2020           Lab Results      Component                Value               Date                      NA                       134 (L)             10/15/2020                K                        4.1                 10/15/2020                CO2                      19 (L)              10/15/2020                GLUCOSE                  309 (H)             10/15/2020                BUN                      10                  10/15/2020                CREATININE               1.21                10/15/2020                CALCIUM                  8.9                 10/15/2020                GFRNONAA                 >60                 10/15/2020                GFRAA                    87 (L)              06/08/2012           10/15/20 ECHO: 1. Left ventricular ejection fraction, by estimation, is 55 to 60%. The  left ventricle has normal function. The left ventricle has no regional  wall motion  abnormalities. Left ventricular diastolic parameters were  normal.  2. Right ventricular systolic function is normal. The right ventricular  size is normal. There is normal pulmonary artery systolic pressure.  3. The mitral valve is normal in structure. Trivial mitral valve  regurgitation.  4. The aortic valve is normal in structure. Aortic valve regurgitation is  not visualized.   Cath 10/07/20: Significant multivessel coronary calcification and multivessel CAD with calcification of the left main coronary artery, 30% proximal and 70% and 50% mid stenoses; 50% proximal ramus stenosis with 70% mid stenosis; long diffuse high marginal/ramus  intermediate like vessel with 90% stenosis, 80% calcified ostial proximal circumflex stenosis with total occlusion of the OM 2 vessel with collateralization, and diffusely diseased 80% small RCA vessel with superior takeoff. Low normal global LV function with EF estimated 50 to 55% with a subtle area of focal distal inferior hypocontractility.  LVEDP 11 mmHg.  09/29/20 Coronary CT: MPRESSION: 1. Coronary calcium score of 896. This was 84th percentile for age and sex matched control. 2. Normal coronary origin with left dominance. 3. Diffuse multivessel coronary calcifications. Degree of stenosis may be overestimated due to severe calcifications 4. Calcified plaque in proximal RCA causes severe (70-99%) stenosis. RCA is small and nondominant 5. Calcified plaque in the distal left main causes mild (24-49%) stenosis 6. Calcified plaque in the proximal LAD causes moderate (50-69%) stenosis. Calcified plaque in the mid LAD causing moderate (50-69%) stenosis 7. Calcified plaque in the proximal LCX causing severe (70-99%) stenosis. Calcified plaque in the mid LCX causes moderate (50-69%) stenosis. Calcified plaque in the distal LCX causes severe(70-99%) stenosis. 8. Akinesis of LV mid inferior wall 9. Will send study for CTFFR CAD-RADS 4 Severe stenosis. (70-99% or > 50% left main). Cardiac catheterization or CT FFR is recommended. Consider symptom-guided anti-ischemic pharmacotherapy as well as risk factor modification per guideline directed care)      Anesthesia Quick Evaluation

## 2020-10-18 ENCOUNTER — Other Ambulatory Visit: Payer: Self-pay

## 2020-10-18 ENCOUNTER — Inpatient Hospital Stay (HOSPITAL_COMMUNITY)
Admission: RE | Admit: 2020-10-18 | Discharge: 2020-10-22 | DRG: 236 | Disposition: A | Discharge status: Home / Self Care | Payer: Medicare HMO | Attending: Cardiothoracic Surgery | Admitting: Cardiothoracic Surgery

## 2020-10-18 ENCOUNTER — Inpatient Hospital Stay (HOSPITAL_COMMUNITY)
Admission: RE | Disposition: A | Discharge status: Home / Self Care | Payer: Self-pay | Source: Ambulatory Visit | Attending: Cardiothoracic Surgery

## 2020-10-18 ENCOUNTER — Encounter (HOSPITAL_COMMUNITY): Payer: Self-pay | Admitting: Cardiothoracic Surgery

## 2020-10-18 ENCOUNTER — Inpatient Hospital Stay (HOSPITAL_COMMUNITY): Payer: Medicare HMO | Admitting: Certified Registered Nurse Anesthetist

## 2020-10-18 ENCOUNTER — Inpatient Hospital Stay (HOSPITAL_COMMUNITY): Payer: Medicare HMO

## 2020-10-18 DIAGNOSIS — Z8546 Personal history of malignant neoplasm of prostate: Secondary | ICD-10-CM

## 2020-10-18 DIAGNOSIS — Z951 Presence of aortocoronary bypass graft: Secondary | ICD-10-CM

## 2020-10-18 DIAGNOSIS — Z888 Allergy status to other drugs, medicaments and biological substances status: Secondary | ICD-10-CM | POA: Diagnosis not present

## 2020-10-18 DIAGNOSIS — I2510 Atherosclerotic heart disease of native coronary artery without angina pectoris: Secondary | ICD-10-CM | POA: Diagnosis not present

## 2020-10-18 DIAGNOSIS — Z91014 Allergy to mammalian meats: Secondary | ICD-10-CM | POA: Diagnosis not present

## 2020-10-18 DIAGNOSIS — Z83438 Family history of other disorder of lipoprotein metabolism and other lipidemia: Secondary | ICD-10-CM | POA: Diagnosis not present

## 2020-10-18 DIAGNOSIS — I517 Cardiomegaly: Secondary | ICD-10-CM | POA: Diagnosis not present

## 2020-10-18 DIAGNOSIS — E8770 Fluid overload, unspecified: Secondary | ICD-10-CM | POA: Diagnosis not present

## 2020-10-18 DIAGNOSIS — D696 Thrombocytopenia, unspecified: Secondary | ICD-10-CM | POA: Diagnosis not present

## 2020-10-18 DIAGNOSIS — J9811 Atelectasis: Secondary | ICD-10-CM | POA: Diagnosis not present

## 2020-10-18 DIAGNOSIS — Z7984 Long term (current) use of oral hypoglycemic drugs: Secondary | ICD-10-CM

## 2020-10-18 DIAGNOSIS — R0902 Hypoxemia: Secondary | ICD-10-CM | POA: Diagnosis not present

## 2020-10-18 DIAGNOSIS — E1151 Type 2 diabetes mellitus with diabetic peripheral angiopathy without gangrene: Secondary | ICD-10-CM | POA: Diagnosis present

## 2020-10-18 DIAGNOSIS — D62 Acute posthemorrhagic anemia: Secondary | ICD-10-CM | POA: Diagnosis not present

## 2020-10-18 DIAGNOSIS — N471 Phimosis: Secondary | ICD-10-CM | POA: Diagnosis present

## 2020-10-18 DIAGNOSIS — J90 Pleural effusion, not elsewhere classified: Secondary | ICD-10-CM | POA: Diagnosis not present

## 2020-10-18 DIAGNOSIS — E785 Hyperlipidemia, unspecified: Secondary | ICD-10-CM | POA: Diagnosis not present

## 2020-10-18 DIAGNOSIS — Z833 Family history of diabetes mellitus: Secondary | ICD-10-CM

## 2020-10-18 DIAGNOSIS — I25119 Atherosclerotic heart disease of native coronary artery with unspecified angina pectoris: Secondary | ICD-10-CM

## 2020-10-18 DIAGNOSIS — C61 Malignant neoplasm of prostate: Secondary | ICD-10-CM | POA: Diagnosis not present

## 2020-10-18 DIAGNOSIS — Z9889 Other specified postprocedural states: Secondary | ICD-10-CM

## 2020-10-18 DIAGNOSIS — D72829 Elevated white blood cell count, unspecified: Secondary | ICD-10-CM | POA: Diagnosis not present

## 2020-10-18 DIAGNOSIS — I493 Ventricular premature depolarization: Secondary | ICD-10-CM | POA: Diagnosis not present

## 2020-10-18 DIAGNOSIS — Z20822 Contact with and (suspected) exposure to covid-19: Secondary | ICD-10-CM | POA: Diagnosis present

## 2020-10-18 DIAGNOSIS — Z8249 Family history of ischemic heart disease and other diseases of the circulatory system: Secondary | ICD-10-CM | POA: Diagnosis not present

## 2020-10-18 DIAGNOSIS — I252 Old myocardial infarction: Secondary | ICD-10-CM | POA: Diagnosis not present

## 2020-10-18 DIAGNOSIS — I119 Hypertensive heart disease without heart failure: Secondary | ICD-10-CM | POA: Diagnosis not present

## 2020-10-18 DIAGNOSIS — I509 Heart failure, unspecified: Secondary | ICD-10-CM | POA: Diagnosis not present

## 2020-10-18 DIAGNOSIS — I081 Rheumatic disorders of both mitral and tricuspid valves: Secondary | ICD-10-CM | POA: Diagnosis not present

## 2020-10-18 DIAGNOSIS — Z09 Encounter for follow-up examination after completed treatment for conditions other than malignant neoplasm: Secondary | ICD-10-CM

## 2020-10-18 HISTORY — DX: Presence of aortocoronary bypass graft: Z95.1

## 2020-10-18 HISTORY — PX: CORONARY ARTERY BYPASS GRAFT: SHX141

## 2020-10-18 HISTORY — PX: TEE WITHOUT CARDIOVERSION: SHX5443

## 2020-10-18 LAB — POCT I-STAT 7, (LYTES, BLD GAS, ICA,H+H)
Acid-Base Excess: 0 mmol/L (ref 0.0–2.0)
Acid-base deficit: 5 mmol/L — ABNORMAL HIGH (ref 0.0–2.0)
Acid-base deficit: 1 mmol/L (ref 0.0–2.0)
Acid-base deficit: 1 mmol/L (ref 0.0–2.0)
Acid-base deficit: 5 mmol/L — ABNORMAL HIGH (ref 0.0–2.0)
Acid-base deficit: 6 mmol/L — ABNORMAL HIGH (ref 0.0–2.0)
Acid-base deficit: 3 mmol/L — ABNORMAL HIGH (ref 0.0–2.0)
Bicarbonate: 22 mmol/L (ref 20.0–28.0)
Bicarbonate: 25.9 mmol/L (ref 20.0–28.0)
Bicarbonate: 24.2 mmol/L (ref 20.0–28.0)
Bicarbonate: 24.5 mmol/L (ref 20.0–28.0)
Bicarbonate: 21.6 mmol/L (ref 20.0–28.0)
Bicarbonate: 19 mmol/L — ABNORMAL LOW (ref 20.0–28.0)
Bicarbonate: 19.4 mmol/L — ABNORMAL LOW (ref 20.0–28.0)
Calcium, Ion: 1.25 mmol/L (ref 1.15–1.40)
Calcium, Ion: 1.02 mmol/L — ABNORMAL LOW (ref 1.15–1.40)
Calcium, Ion: 1.07 mmol/L — ABNORMAL LOW (ref 1.15–1.40)
Calcium, Ion: 1.09 mmol/L — ABNORMAL LOW (ref 1.15–1.40)
Calcium, Ion: 1.19 mmol/L (ref 1.15–1.40)
Calcium, Ion: 1.2 mmol/L (ref 1.15–1.40)
Calcium, Ion: 1.08 mmol/L — ABNORMAL LOW (ref 1.15–1.40)
HCT: 37 % — ABNORMAL LOW (ref 39.0–52.0)
HCT: 28 % — ABNORMAL LOW (ref 39.0–52.0)
HCT: 28 % — ABNORMAL LOW (ref 39.0–52.0)
HCT: 31 % — ABNORMAL LOW (ref 39.0–52.0)
HCT: 37 % — ABNORMAL LOW (ref 39.0–52.0)
HCT: 34 % — ABNORMAL LOW (ref 39.0–52.0)
HCT: 36 % — ABNORMAL LOW (ref 39.0–52.0)
Hemoglobin: 12.6 g/dL — ABNORMAL LOW (ref 13.0–17.0)
Hemoglobin: 9.5 g/dL — ABNORMAL LOW (ref 13.0–17.0)
Hemoglobin: 9.5 g/dL — ABNORMAL LOW (ref 13.0–17.0)
Hemoglobin: 10.5 g/dL — ABNORMAL LOW (ref 13.0–17.0)
Hemoglobin: 12.6 g/dL — ABNORMAL LOW (ref 13.0–17.0)
Hemoglobin: 11.6 g/dL — ABNORMAL LOW (ref 13.0–17.0)
Hemoglobin: 12.2 g/dL — ABNORMAL LOW (ref 13.0–17.0)
O2 Saturation: 100 %
O2 Saturation: 100 %
O2 Saturation: 100 %
O2 Saturation: 100 %
O2 Saturation: 96 %
O2 Saturation: 96 %
O2 Saturation: 96 %
Patient temperature: 35.8
Patient temperature: 36.6
Patient temperature: 36.8
Potassium: 3.8 mmol/L (ref 3.5–5.1)
Potassium: 4.1 mmol/L (ref 3.5–5.1)
Potassium: 4.1 mmol/L (ref 3.5–5.1)
Potassium: 3.8 mmol/L (ref 3.5–5.1)
Potassium: 3.9 mmol/L (ref 3.5–5.1)
Potassium: 4.3 mmol/L (ref 3.5–5.1)
Potassium: 3.9 mmol/L (ref 3.5–5.1)
Sodium: 137 mmol/L (ref 135–145)
Sodium: 137 mmol/L (ref 135–145)
Sodium: 136 mmol/L (ref 135–145)
Sodium: 138 mmol/L (ref 135–145)
Sodium: 139 mmol/L (ref 135–145)
Sodium: 136 mmol/L (ref 135–145)
Sodium: 140 mmol/L (ref 135–145)
TCO2: 23 mmol/L (ref 22–32)
TCO2: 27 mmol/L (ref 22–32)
TCO2: 25 mmol/L (ref 22–32)
TCO2: 26 mmol/L (ref 22–32)
TCO2: 23 mmol/L (ref 22–32)
TCO2: 20 mmol/L — ABNORMAL LOW (ref 22–32)
TCO2: 20 mmol/L — ABNORMAL LOW (ref 22–32)
pCO2 arterial: 46.9 mmHg (ref 32.0–48.0)
pCO2 arterial: 48.3 mmHg — ABNORMAL HIGH (ref 32.0–48.0)
pCO2 arterial: 39.6 mmHg (ref 32.0–48.0)
pCO2 arterial: 42.1 mmHg (ref 32.0–48.0)
pCO2 arterial: 42 mmHg (ref 32.0–48.0)
pCO2 arterial: 35.6 mmHg (ref 32.0–48.0)
pCO2 arterial: 27.4 mmHg — ABNORMAL LOW (ref 32.0–48.0)
pH, Arterial: 7.279 — ABNORMAL LOW (ref 7.350–7.450)
pH, Arterial: 7.337 — ABNORMAL LOW (ref 7.350–7.450)
pH, Arterial: 7.393 (ref 7.350–7.450)
pH, Arterial: 7.373 (ref 7.350–7.450)
pH, Arterial: 7.314 — ABNORMAL LOW (ref 7.350–7.450)
pH, Arterial: 7.333 — ABNORMAL LOW (ref 7.350–7.450)
pH, Arterial: 7.457 — ABNORMAL HIGH (ref 7.350–7.450)
pO2, Arterial: 199 mmHg — ABNORMAL HIGH (ref 83.0–108.0)
pO2, Arterial: 379 mmHg — ABNORMAL HIGH (ref 83.0–108.0)
pO2, Arterial: 359 mmHg — ABNORMAL HIGH (ref 83.0–108.0)
pO2, Arterial: 327 mmHg — ABNORMAL HIGH (ref 83.0–108.0)
pO2, Arterial: 82 mmHg — ABNORMAL LOW (ref 83.0–108.0)
pO2, Arterial: 89 mmHg (ref 83.0–108.0)
pO2, Arterial: 73 mmHg — ABNORMAL LOW (ref 83.0–108.0)

## 2020-10-18 LAB — POCT I-STAT, CHEM 8
BUN: 11 mg/dL (ref 8–23)
BUN: 11 mg/dL (ref 8–23)
BUN: 11 mg/dL (ref 8–23)
BUN: 10 mg/dL (ref 8–23)
Calcium, Ion: 1.28 mmol/L (ref 1.15–1.40)
Calcium, Ion: 1.3 mmol/L (ref 1.15–1.40)
Calcium, Ion: 1.13 mmol/L — ABNORMAL LOW (ref 1.15–1.40)
Calcium, Ion: 1.09 mmol/L — ABNORMAL LOW (ref 1.15–1.40)
Chloride: 104 mmol/L (ref 98–111)
Chloride: 104 mmol/L (ref 98–111)
Chloride: 101 mmol/L (ref 98–111)
Chloride: 103 mmol/L (ref 98–111)
Creatinine, Ser: 0.9 mg/dL (ref 0.61–1.24)
Creatinine, Ser: 0.8 mg/dL (ref 0.61–1.24)
Creatinine, Ser: 0.8 mg/dL (ref 0.61–1.24)
Creatinine, Ser: 0.8 mg/dL (ref 0.61–1.24)
Glucose, Bld: 198 mg/dL — ABNORMAL HIGH (ref 70–99)
Glucose, Bld: 219 mg/dL — ABNORMAL HIGH (ref 70–99)
Glucose, Bld: 176 mg/dL — ABNORMAL HIGH (ref 70–99)
Glucose, Bld: 133 mg/dL — ABNORMAL HIGH (ref 70–99)
HCT: 37 % — ABNORMAL LOW (ref 39.0–52.0)
HCT: 35 % — ABNORMAL LOW (ref 39.0–52.0)
HCT: 28 % — ABNORMAL LOW (ref 39.0–52.0)
HCT: 30 % — ABNORMAL LOW (ref 39.0–52.0)
Hemoglobin: 12.6 g/dL — ABNORMAL LOW (ref 13.0–17.0)
Hemoglobin: 11.9 g/dL — ABNORMAL LOW (ref 13.0–17.0)
Hemoglobin: 9.5 g/dL — ABNORMAL LOW (ref 13.0–17.0)
Hemoglobin: 10.2 g/dL — ABNORMAL LOW (ref 13.0–17.0)
Potassium: 3.8 mmol/L (ref 3.5–5.1)
Potassium: 4.2 mmol/L (ref 3.5–5.1)
Potassium: 4.4 mmol/L (ref 3.5–5.1)
Potassium: 3.8 mmol/L (ref 3.5–5.1)
Sodium: 137 mmol/L (ref 135–145)
Sodium: 137 mmol/L (ref 135–145)
Sodium: 135 mmol/L (ref 135–145)
Sodium: 138 mmol/L (ref 135–145)
TCO2: 23 mmol/L (ref 22–32)
TCO2: 23 mmol/L (ref 22–32)
TCO2: 23 mmol/L (ref 22–32)
TCO2: 27 mmol/L (ref 22–32)

## 2020-10-18 LAB — CBC
HCT: 38.6 % — ABNORMAL LOW (ref 39.0–52.0)
HCT: 37.3 % — ABNORMAL LOW (ref 39.0–52.0)
Hemoglobin: 14.2 g/dL (ref 13.0–17.0)
Hemoglobin: 13.6 g/dL (ref 13.0–17.0)
MCH: 32.3 pg (ref 26.0–34.0)
MCH: 31.8 pg (ref 26.0–34.0)
MCHC: 36.8 g/dL — ABNORMAL HIGH (ref 30.0–36.0)
MCHC: 36.5 g/dL — ABNORMAL HIGH (ref 30.0–36.0)
MCV: 87.7 fL (ref 80.0–100.0)
MCV: 87.1 fL (ref 80.0–100.0)
Platelets: 149 10*3/uL — ABNORMAL LOW (ref 150–400)
Platelets: 190 10*3/uL (ref 150–400)
RBC: 4.4 MIL/uL (ref 4.22–5.81)
RBC: 4.28 MIL/uL (ref 4.22–5.81)
RDW: 11.7 % (ref 11.5–15.5)
RDW: 11.8 % (ref 11.5–15.5)
WBC: 18.6 10*3/uL — ABNORMAL HIGH (ref 4.0–10.5)
WBC: 26.1 10*3/uL — ABNORMAL HIGH (ref 4.0–10.5)
nRBC: 0 % (ref 0.0–0.2)
nRBC: 0 % (ref 0.0–0.2)

## 2020-10-18 LAB — GLUCOSE, CAPILLARY
Glucose-Capillary: 171 mg/dL — ABNORMAL HIGH (ref 70–99)
Glucose-Capillary: 114 mg/dL — ABNORMAL HIGH (ref 70–99)
Glucose-Capillary: 119 mg/dL — ABNORMAL HIGH (ref 70–99)
Glucose-Capillary: 128 mg/dL — ABNORMAL HIGH (ref 70–99)
Glucose-Capillary: 115 mg/dL — ABNORMAL HIGH (ref 70–99)
Glucose-Capillary: 100 mg/dL — ABNORMAL HIGH (ref 70–99)
Glucose-Capillary: 117 mg/dL — ABNORMAL HIGH (ref 70–99)
Glucose-Capillary: 118 mg/dL — ABNORMAL HIGH (ref 70–99)
Glucose-Capillary: 133 mg/dL — ABNORMAL HIGH (ref 70–99)
Glucose-Capillary: 168 mg/dL — ABNORMAL HIGH (ref 70–99)
Glucose-Capillary: 146 mg/dL — ABNORMAL HIGH (ref 70–99)
Glucose-Capillary: 136 mg/dL — ABNORMAL HIGH (ref 70–99)

## 2020-10-18 LAB — POCT I-STAT EG7
Acid-base deficit: 3 mmol/L — ABNORMAL HIGH (ref 0.0–2.0)
Bicarbonate: 23.9 mmol/L (ref 20.0–28.0)
Calcium, Ion: 1.05 mmol/L — ABNORMAL LOW (ref 1.15–1.40)
HCT: 30 % — ABNORMAL LOW (ref 39.0–52.0)
Hemoglobin: 10.2 g/dL — ABNORMAL LOW (ref 13.0–17.0)
O2 Saturation: 83 %
Potassium: 4 mmol/L (ref 3.5–5.1)
Sodium: 137 mmol/L (ref 135–145)
TCO2: 25 mmol/L (ref 22–32)
pCO2, Ven: 47.8 mmHg (ref 44.0–60.0)
pH, Ven: 7.306 (ref 7.250–7.430)
pO2, Ven: 53 mmHg — ABNORMAL HIGH (ref 32.0–45.0)

## 2020-10-18 LAB — BASIC METABOLIC PANEL
Anion gap: 6 (ref 5–15)
BUN: 11 mg/dL (ref 8–23)
CO2: 21 mmol/L — ABNORMAL LOW (ref 22–32)
Calcium: 7.2 mg/dL — ABNORMAL LOW (ref 8.9–10.3)
Chloride: 108 mmol/L (ref 98–111)
Creatinine, Ser: 1.16 mg/dL (ref 0.61–1.24)
GFR, Estimated: >60 mL/min (ref >60)
Glucose, Bld: 153 mg/dL — ABNORMAL HIGH (ref 70–99)
Potassium: 3.6 mmol/L (ref 3.5–5.1)
Sodium: 135 mmol/L (ref 135–145)

## 2020-10-18 LAB — ECHO INTRAOPERATIVE TEE
AR max vel: 2.26 cm2
AV Area VTI: 2.17 cm2
AV Area mean vel: 2.12 cm2
AV Mean grad: 6 mmHg
AV Peak grad: 11.6 mmHg
Ao pk vel: 1.7 m/s
Area-P 1/2: 3.76 cm2
Height: 70 in
S' Lateral: 3.33 cm
Weight: 3301.61 oz

## 2020-10-18 LAB — PROTIME-INR
INR: 1.4 — ABNORMAL HIGH (ref 0.8–1.2)
Prothrombin Time: 17.2 seconds — ABNORMAL HIGH (ref 11.4–15.2)

## 2020-10-18 LAB — APTT: aPTT: 29 seconds (ref 24–36)

## 2020-10-18 LAB — MAGNESIUM: Magnesium: 3 mg/dL — ABNORMAL HIGH (ref 1.7–2.4)

## 2020-10-18 LAB — HEMOGLOBIN AND HEMATOCRIT, BLOOD
HCT: 30.3 % — ABNORMAL LOW (ref 39.0–52.0)
Hemoglobin: 11 g/dL — ABNORMAL LOW (ref 13.0–17.0)

## 2020-10-18 LAB — PLATELET COUNT: Platelets: 150 10*3/uL (ref 150–400)

## 2020-10-18 SURGERY — CORONARY ARTERY BYPASS GRAFTING (CABG)
Anesthesia: General | Site: Chest

## 2020-10-18 MED ORDER — MIDAZOLAM HCL 2 MG/2ML IJ SOLN: 2.0000 mg | INTRAMUSCULAR | Status: DC | PRN

## 2020-10-18 MED ORDER — CHLORHEXIDINE GLUCONATE 4 % EX LIQD: 30.0000 mL | CUTANEOUS | Status: DC

## 2020-10-18 MED ORDER — MAGNESIUM SULFATE 4 GM/100ML IV SOLN
4.0000 g | Freq: Once | INTRAVENOUS | Status: AC
  Administered 2020-10-18: 4 g via INTRAVENOUS
  Filled 2020-10-18: qty 100

## 2020-10-18 MED ORDER — BISACODYL 10 MG RE SUPP: 10.0000 mg | Freq: Every day | RECTAL | Status: DC

## 2020-10-18 MED ORDER — FAMOTIDINE IN NACL 20-0.9 MG/50ML-% IV SOLN
20.0000 mg | INTRAVENOUS | Status: AC
  Administered 2020-10-18: 20 mg via INTRAVENOUS
  Filled 2020-10-18: qty 50

## 2020-10-18 MED ORDER — NITROGLYCERIN IN D5W 200-5 MCG/ML-% IV SOLN: 0.0000 ug/min | INTRAVENOUS | Status: DC

## 2020-10-18 MED ORDER — METOPROLOL TARTRATE 12.5 MG HALF TABLET
12.5000 mg | ORAL_TABLET | Freq: Two times a day (BID) | ORAL | Status: DC
  Administered 2020-10-19 – 2020-10-22 (×6): 12.5 mg via ORAL
  Filled 2020-10-18 (×6): qty 1

## 2020-10-18 MED ORDER — ASPIRIN 81 MG PO CHEW: 324.0000 mg | CHEWABLE_TABLET | Freq: Every day | ORAL | Status: DC

## 2020-10-18 MED ORDER — LACTATED RINGERS IV SOLN
500.0000 mL | Freq: Once | INTRAVENOUS | Status: AC | PRN
  Administered 2020-10-18: 500 mL via INTRAVENOUS

## 2020-10-18 MED ORDER — ARTIFICIAL TEARS OPHTHALMIC OINT
TOPICAL_OINTMENT | OPHTHALMIC | Status: AC
  Filled 2020-10-18: qty 3.5

## 2020-10-18 MED ORDER — SODIUM CHLORIDE 0.9% FLUSH
3.0000 mL | Freq: Two times a day (BID) | INTRAVENOUS | Status: DC
  Administered 2020-10-19 (×2): 3 mL via INTRAVENOUS

## 2020-10-18 MED ORDER — PROTAMINE SULFATE 10 MG/ML IV SOLN
INTRAVENOUS | Status: AC
  Filled 2020-10-18: qty 25

## 2020-10-18 MED ORDER — FENTANYL CITRATE (PF) 250 MCG/5ML IJ SOLN
INTRAMUSCULAR | Status: AC
  Filled 2020-10-18: qty 25

## 2020-10-18 MED ORDER — METHYLPREDNISOLONE SODIUM SUCC 125 MG IJ SOLR
125.0000 mg | INTRAMUSCULAR | Status: DC
  Filled 2020-10-18: qty 2

## 2020-10-18 MED ORDER — BUPIVACAINE HCL 0.5 % IJ SOLN
INTRAMUSCULAR | Status: AC
  Filled 2020-10-18: qty 1

## 2020-10-18 MED ORDER — LACTATED RINGERS IV SOLN: INTRAVENOUS | Status: DC

## 2020-10-18 MED ORDER — HEPARIN SODIUM (PORCINE) 1000 UNIT/ML IJ SOLN
INTRAMUSCULAR | Status: AC
  Filled 2020-10-18: qty 1

## 2020-10-18 MED ORDER — LACTATED RINGERS IV SOLN: INTRAVENOUS | Status: DC | PRN

## 2020-10-18 MED ORDER — CHLORHEXIDINE GLUCONATE 0.12 % MT SOLN: 15.0000 mL | OROMUCOSAL | Status: AC

## 2020-10-18 MED ORDER — SODIUM BICARBONATE 8.4 % IV SOLN
100.0000 meq | Freq: Once | INTRAVENOUS | Status: AC
  Administered 2020-10-18: 100 meq via INTRAVENOUS

## 2020-10-18 MED ORDER — BISACODYL 5 MG PO TBEC
10.0000 mg | DELAYED_RELEASE_TABLET | Freq: Every day | ORAL | Status: DC
  Administered 2020-10-19 – 2020-10-21 (×3): 10 mg via ORAL
  Filled 2020-10-18 (×3): qty 2

## 2020-10-18 MED ORDER — DIPHENHYDRAMINE HCL 50 MG/ML IJ SOLN
50.0000 mg | INTRAMUSCULAR | Status: AC
  Administered 2020-10-18: 50 mg via INTRAVENOUS
  Filled 2020-10-18: qty 1

## 2020-10-18 MED ORDER — DOCUSATE SODIUM 100 MG PO CAPS
200.0000 mg | ORAL_CAPSULE | Freq: Every day | ORAL | Status: DC
  Administered 2020-10-19 – 2020-10-22 (×4): 200 mg via ORAL
  Filled 2020-10-18 (×4): qty 2

## 2020-10-18 MED ORDER — SODIUM CHLORIDE 0.9 % IV SOLN: INTRAVENOUS | Status: DC | PRN

## 2020-10-18 MED ORDER — ACETAMINOPHEN 650 MG RE SUPP: 650.0000 mg | Freq: Once | RECTAL | Status: DC

## 2020-10-18 MED ORDER — BUPIVACAINE LIPOSOME 1.3 % IJ SUSP
INTRAMUSCULAR | Status: DC | PRN
  Administered 2020-10-18: 50 mL

## 2020-10-18 MED ORDER — SODIUM CHLORIDE 0.45 % IV SOLN: INTRAVENOUS | Status: DC | PRN

## 2020-10-18 MED ORDER — ONDANSETRON HCL 4 MG/2ML IJ SOLN
4.0000 mg | Freq: Four times a day (QID) | INTRAMUSCULAR | Status: DC | PRN
  Administered 2020-10-18 – 2020-10-21 (×4): 4 mg via INTRAVENOUS
  Filled 2020-10-18 (×4): qty 2

## 2020-10-18 MED ORDER — PROTAMINE SULFATE 10 MG/ML IV SOLN
INTRAVENOUS | Status: DC | PRN
  Administered 2020-10-18: 300 mg via INTRAVENOUS

## 2020-10-18 MED ORDER — VANCOMYCIN HCL IN DEXTROSE 1-5 GM/200ML-% IV SOLN
1000.0000 mg | Freq: Once | INTRAVENOUS | Status: AC
  Administered 2020-10-18: 1000 mg via INTRAVENOUS
  Filled 2020-10-18: qty 200

## 2020-10-18 MED ORDER — THROMBIN 5000 UNITS EX SOLR
INTRAVENOUS | Status: DC | PRN
  Administered 2020-10-18: 2 mL

## 2020-10-18 MED ORDER — ASPIRIN EC 325 MG PO TBEC
325.0000 mg | DELAYED_RELEASE_TABLET | Freq: Every day | ORAL | Status: DC
  Administered 2020-10-19: 325 mg via ORAL
  Filled 2020-10-18: qty 1

## 2020-10-18 MED ORDER — DEXTROSE 50 % IV SOLN: 0.0000 mL | INTRAVENOUS | Status: DC | PRN

## 2020-10-18 MED ORDER — SODIUM CHLORIDE 0.9% FLUSH
10.0000 mL | Freq: Two times a day (BID) | INTRAVENOUS | Status: DC
  Administered 2020-10-18 – 2020-10-19 (×2): 10 mL

## 2020-10-18 MED ORDER — SUCCINYLCHOLINE CHLORIDE 200 MG/10ML IV SOSY
PREFILLED_SYRINGE | INTRAVENOUS | Status: AC
  Filled 2020-10-18: qty 10

## 2020-10-18 MED ORDER — POTASSIUM CHLORIDE 10 MEQ/50ML IV SOLN
10.0000 meq | INTRAVENOUS | Status: AC
Start: 2020-10-18 — End: 2020-10-18

## 2020-10-18 MED ORDER — OXYCODONE HCL 5 MG PO TABS: 5.0000 mg | ORAL_TABLET | ORAL | Status: DC | PRN

## 2020-10-18 MED ORDER — PROPOFOL 10 MG/ML IV BOLUS
INTRAVENOUS | Status: AC
  Filled 2020-10-18: qty 20

## 2020-10-18 MED ORDER — INSULIN REGULAR(HUMAN) IN NACL 100-0.9 UT/100ML-% IV SOLN
INTRAVENOUS | Status: DC
  Administered 2020-10-19: 8 [IU]/h via INTRAVENOUS
  Filled 2020-10-18: qty 100

## 2020-10-18 MED ORDER — PLASMA-LYTE A IV SOLN: INTRAVENOUS | Status: DC

## 2020-10-18 MED ORDER — DIPHENHYDRAMINE HCL 50 MG/ML IJ SOLN
INTRAMUSCULAR | Status: AC
  Filled 2020-10-18: qty 1

## 2020-10-18 MED ORDER — SODIUM CHLORIDE 0.9% FLUSH: 10.0000 mL | INTRAVENOUS | Status: DC | PRN

## 2020-10-18 MED ORDER — LIDOCAINE 2% (20 MG/ML) 5 ML SYRINGE
INTRAMUSCULAR | Status: AC
  Filled 2020-10-18: qty 5

## 2020-10-18 MED ORDER — BUPIVACAINE HCL (PF) 0.5 % IJ SOLN
INTRAMUSCULAR | Status: AC
  Filled 2020-10-18: qty 30

## 2020-10-18 MED ORDER — FAMOTIDINE IN NACL 20-0.9 MG/50ML-% IV SOLN
20.0000 mg | Freq: Two times a day (BID) | INTRAVENOUS | Status: DC
  Administered 2020-10-18: 20 mg via INTRAVENOUS
  Filled 2020-10-18: qty 50

## 2020-10-18 MED ORDER — MIDAZOLAM HCL (PF) 10 MG/2ML IJ SOLN
INTRAMUSCULAR | Status: AC
  Filled 2020-10-18: qty 2

## 2020-10-18 MED ORDER — HEPARIN SODIUM (PORCINE) 1000 UNIT/ML IJ SOLN
INTRAMUSCULAR | Status: DC | PRN
  Administered 2020-10-18: 5000 [IU] via INTRAVENOUS
  Administered 2020-10-18: 25000 [IU] via INTRAVENOUS

## 2020-10-18 MED ORDER — VANCOMYCIN HCL 1000 MG IV SOLR
INTRAVENOUS | Status: DC | PRN
  Administered 2020-10-18: 3 g

## 2020-10-18 MED ORDER — METOPROLOL TARTRATE 12.5 MG HALF TABLET
12.5000 mg | ORAL_TABLET | Freq: Once | ORAL | Status: DC
  Filled 2020-10-18: qty 1

## 2020-10-18 MED ORDER — HEMOSTATIC AGENTS (NO CHARGE) OPTIME
TOPICAL | Status: DC | PRN
  Administered 2020-10-18: 2 via TOPICAL

## 2020-10-18 MED ORDER — HYDROCORTISONE NA SUCCINATE PF 100 MG IJ SOLR
INTRAMUSCULAR | Status: DC | PRN
  Administered 2020-10-18: 125 mg via INTRAVENOUS

## 2020-10-18 MED ORDER — TRAMADOL HCL 50 MG PO TABS: 50.0000 mg | ORAL_TABLET | ORAL | Status: DC | PRN

## 2020-10-18 MED ORDER — MIDAZOLAM HCL 5 MG/5ML IJ SOLN
INTRAMUSCULAR | Status: DC | PRN
  Administered 2020-10-18: 1 mg via INTRAVENOUS
  Administered 2020-10-18: 2 mg via INTRAVENOUS
  Administered 2020-10-18: 1 mg via INTRAVENOUS

## 2020-10-18 MED ORDER — ACETAMINOPHEN 500 MG PO TABS
1000.0000 mg | ORAL_TABLET | Freq: Four times a day (QID) | ORAL | Status: DC
  Administered 2020-10-19 – 2020-10-22 (×15): 1000 mg via ORAL
  Filled 2020-10-18 (×16): qty 2

## 2020-10-18 MED ORDER — FENTANYL CITRATE (PF) 250 MCG/5ML IJ SOLN
INTRAMUSCULAR | Status: DC | PRN
  Administered 2020-10-18 (×3): 50 ug via INTRAVENOUS
  Administered 2020-10-18: 100 ug via INTRAVENOUS
  Administered 2020-10-18 (×2): 150 ug via INTRAVENOUS
  Administered 2020-10-18 (×2): 50 ug via INTRAVENOUS
  Administered 2020-10-18: 100 ug via INTRAVENOUS

## 2020-10-18 MED ORDER — SODIUM CHLORIDE 0.9% FLUSH: 3.0000 mL | INTRAVENOUS | Status: DC | PRN

## 2020-10-18 MED ORDER — CEFUROXIME SODIUM 1.5 G IV SOLR
1.5000 g | Freq: Two times a day (BID) | INTRAVENOUS | Status: AC
  Administered 2020-10-18 – 2020-10-20 (×4): 1.5 g via INTRAVENOUS
  Filled 2020-10-18 (×4): qty 1.5

## 2020-10-18 MED ORDER — BUPIVACAINE LIPOSOME 1.3 % IJ SUSP
INTRAMUSCULAR | Status: AC
  Filled 2020-10-18: qty 20

## 2020-10-18 MED ORDER — CHLORHEXIDINE GLUCONATE 0.12 % MT SOLN
15.0000 mL | Freq: Once | OROMUCOSAL | Status: AC
  Administered 2020-10-18 – 2020-10-19 (×3): 15 mL via OROMUCOSAL
  Filled 2020-10-18: qty 15

## 2020-10-18 MED ORDER — EPHEDRINE 5 MG/ML INJ
INTRAVENOUS | Status: AC
  Filled 2020-10-18: qty 10

## 2020-10-18 MED ORDER — ACETAMINOPHEN 160 MG/5ML PO SOLN: 1000.0000 mg | Freq: Four times a day (QID) | ORAL | Status: DC

## 2020-10-18 MED ORDER — STERILE WATER FOR INJECTION IJ SOLN
INTRAMUSCULAR | Status: AC
  Filled 2020-10-18: qty 10

## 2020-10-18 MED ORDER — CHLORHEXIDINE GLUCONATE CLOTH 2 % EX PADS: 6.0000 | MEDICATED_PAD | Freq: Every day | CUTANEOUS | Status: DC

## 2020-10-18 MED ORDER — SODIUM CHLORIDE 0.9 % IV SOLN: INTRAVENOUS | Status: DC

## 2020-10-18 MED ORDER — PROPOFOL 10 MG/ML IV BOLUS
INTRAVENOUS | Status: DC | PRN
  Administered 2020-10-18: 40 mg via INTRAVENOUS
  Administered 2020-10-18: 110 mg via INTRAVENOUS

## 2020-10-18 MED ORDER — PLATELET POOR PLASMA OPTIME
Status: DC | PRN
  Administered 2020-10-18: 10 mL

## 2020-10-18 MED ORDER — METOPROLOL TARTRATE 5 MG/5ML IV SOLN: 2.5000 mg | INTRAVENOUS | Status: DC | PRN

## 2020-10-18 MED ORDER — SODIUM CHLORIDE 0.9 % IV SOLN: 250.0000 mL | INTRAVENOUS | Status: DC

## 2020-10-18 MED ORDER — EPINEPHRINE HCL 5 MG/250ML IV SOLN IN NS
0.5000 ug/min | INTRAVENOUS | Status: DC
  Administered 2020-10-18: 3 ug/min via INTRAVENOUS
  Administered 2020-10-19: 5 ug/min via INTRAVENOUS
  Filled 2020-10-18: qty 250

## 2020-10-18 MED ORDER — ACETAMINOPHEN 160 MG/5ML PO SOLN: 650.0000 mg | Freq: Once | ORAL | Status: DC

## 2020-10-18 MED ORDER — PLASMA-LYTE 148 IV SOLN
INTRAVENOUS | Status: DC | PRN
  Administered 2020-10-18: 500 mL

## 2020-10-18 MED ORDER — DEXMEDETOMIDINE HCL IN NACL 400 MCG/100ML IV SOLN: 0.0000 ug/kg/h | INTRAVENOUS | Status: DC

## 2020-10-18 MED ORDER — VANCOMYCIN HCL 1000 MG IV SOLR
INTRAVENOUS | Status: AC
  Filled 2020-10-18: qty 3000

## 2020-10-18 MED ORDER — CHLORHEXIDINE GLUCONATE CLOTH 2 % EX PADS
6.0000 | MEDICATED_PAD | Freq: Every day | CUTANEOUS | Status: DC
  Administered 2020-10-18 – 2020-10-19 (×2): 6 via TOPICAL

## 2020-10-18 MED ORDER — METOPROLOL TARTRATE 25 MG/10 ML ORAL SUSPENSION
12.5000 mg | Freq: Two times a day (BID) | ORAL | Status: DC
  Filled 2020-10-18 (×3): qty 5

## 2020-10-18 MED ORDER — POTASSIUM CHLORIDE 10 MEQ/50ML IV SOLN
10.0000 meq | INTRAVENOUS | Status: AC
  Administered 2020-10-18 – 2020-10-19 (×3): 10 meq via INTRAVENOUS

## 2020-10-18 MED ORDER — ROCURONIUM BROMIDE 10 MG/ML (PF) SYRINGE
PREFILLED_SYRINGE | INTRAVENOUS | Status: AC
  Filled 2020-10-18: qty 10

## 2020-10-18 MED ORDER — PHENYLEPHRINE HCL-NACL 20-0.9 MG/250ML-% IV SOLN: 0.0000 ug/min | INTRAVENOUS | Status: DC

## 2020-10-18 MED ORDER — 0.9 % SODIUM CHLORIDE (POUR BTL) OPTIME
TOPICAL | Status: DC | PRN
  Administered 2020-10-18: 5000 mL

## 2020-10-18 MED ORDER — ROCURONIUM BROMIDE 10 MG/ML (PF) SYRINGE
PREFILLED_SYRINGE | INTRAVENOUS | Status: DC | PRN
  Administered 2020-10-18: 40 mg via INTRAVENOUS
  Administered 2020-10-18: 30 mg via INTRAVENOUS
  Administered 2020-10-18: 60 mg via INTRAVENOUS

## 2020-10-18 MED ORDER — PLATELET RICH PLASMA OPTIME
Status: DC | PRN
  Administered 2020-10-18: 10 mL

## 2020-10-18 MED ORDER — PHENYLEPHRINE 40 MCG/ML (10ML) SYRINGE FOR IV PUSH (FOR BLOOD PRESSURE SUPPORT)
PREFILLED_SYRINGE | INTRAVENOUS | Status: AC
  Filled 2020-10-18: qty 10

## 2020-10-18 MED ORDER — MORPHINE SULFATE (PF) 2 MG/ML IV SOLN
1.0000 mg | INTRAVENOUS | Status: DC | PRN
  Administered 2020-10-18: 2 mg via INTRAVENOUS
  Filled 2020-10-18: qty 1

## 2020-10-18 MED ORDER — PANTOPRAZOLE SODIUM 40 MG PO TBEC
40.0000 mg | DELAYED_RELEASE_TABLET | Freq: Every day | ORAL | Status: DC
  Administered 2020-10-20 – 2020-10-22 (×3): 40 mg via ORAL
  Filled 2020-10-18 (×3): qty 1

## 2020-10-18 SURGICAL SUPPLY — 92 items
ADAPTER CARDIO PERF ANTE/RETRO (ADAPTER) ×3 IMPLANT
APPLICATOR TIP COSEAL (VASCULAR PRODUCTS) IMPLANT
BAG DECANTER FOR FLEXI CONT (MISCELLANEOUS) ×3 IMPLANT
BLADE CLIPPER SURG (BLADE) ×3 IMPLANT
BLADE STERNUM SYSTEM 6 (BLADE) ×3 IMPLANT
BNDG ELASTIC 4X5.8 VLCR STR LF (GAUZE/BANDAGES/DRESSINGS) ×3 IMPLANT
BNDG ELASTIC 6X10 VLCR STRL LF (GAUZE/BANDAGES/DRESSINGS) ×3 IMPLANT
BNDG ELASTIC 6X5.8 VLCR STR LF (GAUZE/BANDAGES/DRESSINGS) ×3 IMPLANT
BNDG GAUZE ELAST 4 BULKY (GAUZE/BANDAGES/DRESSINGS) ×3 IMPLANT
CANISTER SUCT 3000ML PPV (MISCELLANEOUS) ×3 IMPLANT
CATH CPB KIT HENDRICKSON (MISCELLANEOUS) ×3 IMPLANT
CATH ROBINSON RED A/P 18FR (CATHETERS) ×6 IMPLANT
CLIP RETRACTION 3.0MM CORONARY (MISCELLANEOUS) ×6 IMPLANT
CLIP VESOCCLUDE SM WIDE 24/CT (CLIP) ×9 IMPLANT
DERMABOND ADVANCED (GAUZE/BANDAGES/DRESSINGS) ×1
DERMABOND ADVANCED .7 DNX12 (GAUZE/BANDAGES/DRESSINGS) ×2 IMPLANT
DRAIN CHANNEL 28F RND 3/8 FF (WOUND CARE) ×9 IMPLANT
DRAPE CARDIOVASCULAR INCISE (DRAPES) ×1
DRAPE SLUSH/WARMER DISC (DRAPES) ×3 IMPLANT
DRAPE SRG 135X102X78XABS (DRAPES) ×2 IMPLANT
DRSG AQUACEL AG ADV 3.5X14 (GAUZE/BANDAGES/DRESSINGS) ×3 IMPLANT
ELECT CAUTERY BLADE 6.4 (BLADE) ×3 IMPLANT
ELECT REM PT RETURN 9FT ADLT (ELECTROSURGICAL) ×6
ELECTRODE REM PT RTRN 9FT ADLT (ELECTROSURGICAL) ×4 IMPLANT
FELT TEFLON 1X6 (MISCELLANEOUS) ×6 IMPLANT
GAUZE SPONGE 4X4 12PLY STRL (GAUZE/BANDAGES/DRESSINGS) ×6 IMPLANT
GLOVE NEODERM STRL 7.5 LF PF (GLOVE) ×6 IMPLANT
GLOVE SURG NEODERM 7.5  LF PF (GLOVE) ×3
GOWN STRL REUS W/ TWL LRG LVL3 (GOWN DISPOSABLE) ×8 IMPLANT
GOWN STRL REUS W/TWL LRG LVL3 (GOWN DISPOSABLE) ×4
HEMOSTAT POWDER SURGIFOAM 1G (HEMOSTASIS) ×6 IMPLANT
INSERT FOGARTY XLG (MISCELLANEOUS) ×3 IMPLANT
INSERT SUTURE HOLDER (MISCELLANEOUS) ×3 IMPLANT
KIT APPLICATOR RATIO 11:1 (KITS) ×3 IMPLANT
KIT BASIN OR (CUSTOM PROCEDURE TRAY) ×3 IMPLANT
KIT SUCTION CATH 14FR (SUCTIONS) ×3 IMPLANT
KIT TURNOVER KIT B (KITS) ×3 IMPLANT
KIT VASOVIEW HEMOPRO 2 VH 4000 (KITS) ×3 IMPLANT
MARKER GRAFT CORONARY BYPASS (MISCELLANEOUS) ×9 IMPLANT
NEEDLE 18GX1X1/2 (RX/OR ONLY) (NEEDLE) ×3 IMPLANT
NS IRRIG 1000ML POUR BTL (IV SOLUTION) ×15 IMPLANT
PACK E OPEN HEART (SUTURE) ×3 IMPLANT
PACK OPEN HEART (CUSTOM PROCEDURE TRAY) ×3 IMPLANT
PACK PLATELET PROCEDURE 60 (MISCELLANEOUS) ×3 IMPLANT
PACK SPY-PHI (KITS) ×3 IMPLANT
PAD ARMBOARD 7.5X6 YLW CONV (MISCELLANEOUS) ×6 IMPLANT
PAD ELECT DEFIB RADIOL ZOLL (MISCELLANEOUS) ×3 IMPLANT
PENCIL BUTTON HOLSTER BLD 10FT (ELECTRODE) ×3 IMPLANT
POSITIONER HEAD DONUT 9IN (MISCELLANEOUS) ×3 IMPLANT
POWDER SURGICEL 3.0 GRAM (HEMOSTASIS) ×6 IMPLANT
PUNCH AORTIC ROTATE 4.5MM 8IN (MISCELLANEOUS) ×3 IMPLANT
SEALANT SURG COSEAL 4ML (VASCULAR PRODUCTS) ×3 IMPLANT
SEALANT SURG COSEAL 8ML (VASCULAR PRODUCTS) ×6 IMPLANT
SET CARDIOPLEGIA MPS 5001102 (MISCELLANEOUS) ×3 IMPLANT
SET CYSTO W/LG BORE CLAMP LF (SET/KITS/TRAYS/PACK) ×3 IMPLANT
SUT BONE WAX W31G (SUTURE) ×3 IMPLANT
SUT MNCRL AB 3-0 PS2 18 (SUTURE) ×6 IMPLANT
SUT MNCRL AB 4-0 PS2 18 (SUTURE) ×3 IMPLANT
SUT PDS AB 1 CTX 36 (SUTURE) ×6 IMPLANT
SUT PROLENE 3 0 SH DA (SUTURE) ×3 IMPLANT
SUT PROLENE 4 0 RB 1 (SUTURE) ×1
SUT PROLENE 4-0 RB1 .5 CRCL 36 (SUTURE) ×2 IMPLANT
SUT PROLENE 5 0 C 1 36 (SUTURE) IMPLANT
SUT PROLENE 6 0 C 1 30 (SUTURE) ×9 IMPLANT
SUT PROLENE 7 0 BV 1 (SUTURE) ×3 IMPLANT
SUT PROLENE 7 0 BV1 MDA (SUTURE) ×3 IMPLANT
SUT PROLENE 8 0 BV175 6 (SUTURE) IMPLANT
SUT PROLENE BLUE 7 0 (SUTURE) ×3 IMPLANT
SUT SILK  1 MH (SUTURE) ×1
SUT SILK 1 MH (SUTURE) ×2 IMPLANT
SUT SILK 2 0 SH CR/8 (SUTURE) IMPLANT
SUT SILK 3 0 SH CR/8 (SUTURE) IMPLANT
SUT STEEL 6MS V (SUTURE) ×3 IMPLANT
SUT STEEL SZ 6 DBL 3X14 BALL (SUTURE) ×3 IMPLANT
SUT VIC AB 2-0 CT1 27 (SUTURE) ×1
SUT VIC AB 2-0 CT1 TAPERPNT 27 (SUTURE) ×2 IMPLANT
SUT VIC AB 2-0 CTX 27 (SUTURE) IMPLANT
SUT VIC AB 3-0 X1 27 (SUTURE) IMPLANT
SYR 10ML LL (SYRINGE) IMPLANT
SYR 30ML LL (SYRINGE) ×3 IMPLANT
SYR 3ML LL SCALE MARK (SYRINGE) ×3 IMPLANT
SYSTEM SAHARA CHEST DRAIN ATS (WOUND CARE) ×3 IMPLANT
TAPE CLOTH SURG 4X10 WHT LF (GAUZE/BANDAGES/DRESSINGS) ×3 IMPLANT
TAPE PAPER 2X10 WHT MICROPORE (GAUZE/BANDAGES/DRESSINGS) ×3 IMPLANT
TIP DUAL SPRAY TOPICAL (TIP) ×6 IMPLANT
TOWEL GREEN STERILE (TOWEL DISPOSABLE) ×3 IMPLANT
TOWEL GREEN STERILE FF (TOWEL DISPOSABLE) ×3 IMPLANT
TRAY FOLEY SLVR 16FR TEMP STAT (SET/KITS/TRAYS/PACK) ×3 IMPLANT
TUBING LAP HI FLOW INSUFFLATIO (TUBING) ×3 IMPLANT
UNDERPAD 30X36 HEAVY ABSORB (UNDERPADS AND DIAPERS) ×3 IMPLANT
WATER STERILE IRR 1000ML POUR (IV SOLUTION) ×9 IMPLANT
WATER STERILE IRR 1000ML UROMA (IV SOLUTION) IMPLANT

## 2020-10-18 NOTE — Procedures (Signed)
Extubation Procedure Note  Patient Details:   Name: Daniel Gibson DOB: 1950-10-22 MRN: [id number]   Airway Documentation:    Vent end date: 10/18/20 Vent end time: 1725   Evaluation  O2 sats: stable throughout Complications: No apparent complications Patient did tolerate procedure well. Bilateral Breath Sounds: Clear,Diminished   Yes   Positive cuff leak noted.  Patient placed on [place] 4L with humidity, no stridor noted. Patient able to reach 250 mL using the incentive spirometer.   Bayard Beaver 10/18/2020, 5:39 PM

## 2020-10-18 NOTE — Brief Op Note (Signed)
10/18/2020  11:39 AM  PATIENT:  Daniel Gibson  70 y.o. male  PRE-OPERATIVE DIAGNOSIS:  CAD  POST-OPERATIVE DIAGNOSIS:  CAD  PROCEDURE:  Procedure(s): CORONARY ARTERY BYPASS GRAFTING (CABG)x3. LEFT INTERNAL MAMMARY ARTERY. RIGHT ENDOSCOPIC SAPHENOUS VEIN HARVESTING. (N/A) TRANSESOPHAGEAL ECHOCARDIOGRAM (TEE) (N/A) LIMA-LAD SVG-OM SVG-PD EVH 65 MIN   SURGEON:  Surgeon(s) and Role:    * Quadir Muns, Glenice Bow, MD - Primary  PHYSICIAN ASSISTANT: WAYNE GOLD PA-C  ANESTHESIA:   general   BLOOD ADMINISTERED:none  DRAINS: LEFT PLEURAL AND MEDIASTINAL CHEST TUBES   LOCAL MEDICATIONS USED:  OTHER EXPAREL  SPECIMEN:  No Specimen  DISPOSITION OF SPECIMEN:  N/A  COUNTS:  YES  TOURNIQUET:  * No tourniquets in log *  DICTATION: .Dragon Dictation  PLAN OF CARE: Admit to inpatient   PATIENT DISPOSITION:  ICU - intubated and hemodynamically stable.   Delay start of Pharmacological VTE agent (>24hrs) due to surgical blood loss or risk of bleeding: yes  COMPLICATIONS: NO KNOWN

## 2020-10-18 NOTE — Transfer of Care (Addendum)
Immediate Anesthesia Transfer of Care Note  Patient: Daniel Gibson  Procedure(s) Performed: CORONARY ARTERY BYPASS GRAFTING (CABG)x3. LEFT INTERNAL MAMMARY ARTERY. RIGHT ENDOSCOPIC SAPHENOUS VEIN HARVESTING. (N/A Chest) TRANSESOPHAGEAL ECHOCARDIOGRAM (TEE) (N/A )  Patient Location: SICU, Report to [facility] RN  Anesthesia Type:General  Level of Consciousness: sedated and Patient remains intubated per anesthesia plan  Airway & Oxygen Therapy: Patient remains intubated per anesthesia plan and Patient placed on Ventilator (see vital sign flow sheet for setting)  Post-op Assessment: Report given to RN and Post -op Vital signs reviewed and stable  Post vital signs: Reviewed and stable  Last Vitals:  Vitals Value Taken Time  BP 119/65 10/18/20 1344  Temp 35.8 C 10/18/20 1344  Pulse 80 (paced)   Resp 12 (vent) 10/18/20 1344  SpO2 PAP 98 % 27/16 10/18/20 1344  Vitals shown include unvalidated device data.  Last Pain:  Vitals:   10/18/20 9357  PainSc: 0-No pain      Patients Stated Pain Goal: 2 (01/77/93 9030)  Complications: No complications documented.

## 2020-10-18 NOTE — Anesthesia Procedure Notes (Signed)
Arterial Line Insertion Start/End4/25/2022 6:45 AM, 10/18/2020 6:50 AM Performed by: Lowella Dell, CRNA, CRNA  Preanesthetic checklist: patient identified, IV checked, site marked, risks and benefits discussed, surgical consent, monitors and equipment checked, pre-op evaluation, timeout performed and anesthesia consent Lidocaine 1% used for infiltration and patient sedated Left, radial was placed Catheter size: 20 G Hand hygiene performed  and maximum sterile barriers used   Attempts: 1 Procedure performed without using ultrasound guided technique. Following insertion, Biopatch and dressing applied. Post procedure assessment: normal  Patient tolerated the procedure well with no immediate complications.

## 2020-10-18 NOTE — Anesthesia Procedure Notes (Signed)
Procedure Name: Intubation Date/Time: 10/18/2020 8:27 AM Performed by: Lowella Dell, CRNA Pre-anesthesia Checklist: Patient identified, Emergency Drugs available, Suction available and Patient being monitored Patient Re-evaluated:Patient Re-evaluated prior to induction Oxygen Delivery Method: Circle System Utilized Preoxygenation: Pre-oxygenation with 100% oxygen Induction Type: IV induction Ventilation: Mask ventilation without difficulty Laryngoscope Size: Mac and 4 Grade View: Grade I Tube type: Oral Tube size: 8.0 mm Number of attempts: 1 Airway Equipment and Method: Stylet and Oral airway Placement Confirmation: ETT inserted through vocal cords under direct vision,  positive ETCO2 and breath sounds checked- equal and bilateral Secured at: 23 cm Tube secured with: Tape Dental Injury: Teeth and Oropharynx as per pre-operative assessment

## 2020-10-18 NOTE — Anesthesia Procedure Notes (Addendum)
Central Venous Catheter Insertion Performed by: anesthesiologist Start/End4/25/2022 7:16 AM, 10/18/2020 7:18 AM Patient location: Pre-op. Preanesthetic checklist: patient identified, IV checked, site marked, risks and benefits discussed, surgical consent, monitors and equipment checked, pre-op evaluation, timeout performed and anesthesia consent Position: supine Hand hygiene performed  and maximum sterile barriers used  PA cath was placed.Swan type:thermodilution PA Cath depth:45 Procedure performed without using ultrasound guided technique. Attempts: 1 Patient tolerated the procedure well with no immediate complications.

## 2020-10-18 NOTE — H&P (Addendum)
History and Physical Interval Note:  10/18/2020 7:22 AM  Daniel Gibson  has presented today for surgery, with the diagnosis of CAD.  The various methods of treatment have been discussed with the patient and family. After consideration of risks, benefits and other options for treatment, the patient has consented to  Procedure(s): CORONARY ARTERY BYPASS GRAFTING (CABG) (N/A) TRANSESOPHAGEAL ECHOCARDIOGRAM (TEE) (N/A) as a surgical intervention.  The patient's history has been reviewed, patient examined, no change in status, stable for surgery.  I have reviewed the patient's chart and labs.  Questions were answered to the patient's satisfaction.     Daniel Gibson

## 2020-10-18 NOTE — Progress Notes (Signed)
[address]       [place] [address]             [phone]     CARDIOTHORACIC SURGERY office visitation  Referring Provider is Troy Sine, MD Primary Cardiologist is No primary care provider on file. PCP is Cyndi Bender, PA-C  Chief Complaint  Patient presents with  . Coronary Artery Disease    Surgical consult, Cardiac Cath 10/07/20,     HPI:  70 year old man with a history of hyperlipidemia, extracranial cerebrovascular disease, diabetes and status post MI several years ago presents for evaluation of multivessel coronary artery disease.  He has been in his usual state of health until recently when he noted increased fatigue.  He continues to work but that at the end of many days he reports being overly exhausted.  Given his past medical history he underwent CT scan of the chest for coronary anatomy assessment.  This was suggestive of coronary artery disease.  He underwent left heart catheterization demonstrating severe multivessel coronary artery disease and preserved left ventricular function.  He is referred for CABG.  He denies any classic angina.  He denies any peripheral edema.  He denies cardiac dysrhythmias.  Past Medical History:  Diagnosis Date  . Carotid artery disease ([place]) 05/08/2012   CEA on left 2013  . Coronary artery disease   . Diabetes mellitus without complication ([place])   . Food allergy    Alpha Gal allergy   . Hyperlipidemia   . Hypertensive heart disease without CHF 04/08/2018  . Myocardial infarction ([place]) 1993   medically managed  . Obesity (BMI 30-39.9) 04/08/2018  . Prostate cancer ([place]) 08/07/2016  . PVC's (premature ventricular contractions) 04/08/2018  . Thrombocytopenia ([place])     Past Surgical History:  Procedure Laterality Date  . APPENDECTOMY    . CAROTID ENDARTERECTOMY Right 06-07-12   cea  . ENDARTERECTOMY  06/07/2012   Procedure: ENDARTERECTOMY CAROTID;  Surgeon: Angelia Mould, MD;  Location:  [facility] OR;  Service: Vascular;  Laterality: Right;  Right Carotid Endarterectomy with Patch Angioplasty  . LEFT HEART CATH AND CORONARY ANGIOGRAPHY N/A 10/07/2020   Procedure: LEFT HEART CATH AND CORONARY ANGIOGRAPHY;  Surgeon: Troy Sine, MD;  Location: [facility];  Service: Cardiovascular;  Laterality: N/A;  . TRANSPERINEAL IMPLANT OF RADIATION SEEDS W/ ULTRASOUND      Family History  Problem Relation Age of Onset  . Cancer Mother   . Hyperlipidemia Mother   . Hypertension Mother   . Heart attack Father   . Heart disease Father        Heart Disease before age 33  . Hyperlipidemia Father   . Hypertension Father   . Diabetes Sister   . Heart disease Sister        before age 41  . Hypertension Sister   . Hyperlipidemia Sister   . Diabetes Brother   . Heart disease Brother        before age 57  . Hyperlipidemia Brother   . Hypertension Brother     Social History   Socioeconomic History  . Marital status: Married    Spouse name: Not on file  . Number of children: Not on file  . Years of education: Not on file  . Highest education level: Not on file  Occupational History  . Not on file  Tobacco Use  . Smoking status: Never Smoker  . Smokeless tobacco: Never Used  Vaping  Use  . Vaping Use: Never used  Substance and Sexual Activity  . Alcohol use: No  . Drug use: No  . Sexual activity: Not on file  Other Topics Concern  . Not on file  Social History Narrative  . Not on file   Social Determinants of Health   Financial Resource Strain: Not on file  Food Insecurity: Not on file  Transportation Needs: Not on file  Physical Activity: Not on file  Stress: Not on file  Social Connections: Not on file  Intimate Partner Violence: Not on file    No current facility-administered medications for this visit.   No current outpatient medications on file.   Facility-Administered Medications Ordered in Other Visits  Medication Dose Route Frequency Provider Last  Rate Last Admin  . cefUROXime (ZINACEF) 1.5 g in sodium chloride 0.9 % 100 mL IVPB  1.5 g Intravenous To OR Phiona Ramnauth,  Z, MD      . cefUROXime (ZINACEF) 750 mg in sodium chloride 0.9 % 100 mL IVPB  750 mg Intravenous To OR Nettye Flegal, Glenice Bow, MD      . chlorhexidine (HIBICLENS) 4 % liquid 2 application  30 mL Topical UD Audra Kagel Z, MD      . dexmedetomidine (PRECEDEX) 400 MCG/100ML (4 mcg/mL) infusion  0.1-0.7 mcg/kg/hr Intravenous To OR Lakyn Mantione, Glenice Bow, MD      . diphenhydrAMINE (BENADRYL) injection 50 mg  50 mg Intravenous STAT Atziri Zubiate Z, MD      . EPINEPHrine (ADRENALIN) 4 mg in NS 250 mL (0.016 mg/mL) premix infusion  0-10 mcg/min Intravenous To OR Junious Ragone, Glenice Bow, MD      . famotidine (PEPCID) IVPB 20 mg premix  20 mg Intravenous STAT Cheyenne Schumm Z, MD      . heparin 30,000 units/NS 1000 mL solution for CELLSAVER   Other To OR Henli Hey, Glenice Bow, MD      . heparin sodium (porcine) 2,500 Units, papaverine 30 mg in electrolyte-148 (PLASMALYTE-148) 500 mL irrigation   Irrigation To OR Quantasia Stegner, Glenice Bow, MD      . insulin regular, human (MYXREDLIN) 100 units/ 100 mL infusion   Intravenous To OR Rusty Villella, Glenice Bow, MD      . lactated ringers infusion   Intravenous Continuous Suzette Battiest, MD      . magnesium sulfate (IV Push/IM) injection 40 mEq  40 mEq Other To OR Maleek Craver, Glenice Bow, MD      . methylPREDNISolone sodium succinate (SOLU-MEDROL) 125 mg/2 mL injection 125 mg  125 mg Intravenous STAT Clotilde Loth Z, MD      . metoprolol tartrate (LOPRESSOR) tablet 12.5 mg  12.5 mg Oral Once Demontray Franta, Glenice Bow, MD      . milrinone (PRIMACOR) 20 MG/100 ML (0.2 mg/mL) infusion  0.3 mcg/kg/min Intravenous To OR Angila Wombles Z, MD      . nitroGLYCERIN 50 mg in dextrose 5 % 250 mL (0.2 mg/mL) infusion  2-200 mcg/min Intravenous To OR , Glenice Bow, MD      . norepinephrine (LEVOPHED) 4mg  in 241mL premix infusion  0-40 mcg/min Intravenous To OR Wednesday Ericsson Z, MD       . phenylephrine (NEOSYNEPHRINE) 20-0.9 MG/250ML-% infusion  30-200 mcg/min Intravenous To OR Adrine Hayworth Z, MD      . potassium chloride injection 80 mEq  80 mEq Other To OR Ruba Outen Z, MD      . tranexamic acid (CYKLOKAPRON) 2,500 mg in sodium chloride 0.9 % 250 mL (10 mg/mL) infusion  1.5 mg/kg/hr Intravenous To OR Damonte Frieson, Glenice Bow, MD      . tranexamic acid (CYKLOKAPRON) bolus via infusion - over 30 minutes 1,404 mg  15 mg/kg Intravenous To OR Shenay Torti Z, MD      . tranexamic acid (CYKLOKAPRON) pump prime solution 187 mg  2 mg/kg Intracatheter To OR Matisha Termine Z, MD      . vancomycin (VANCOREADY) IVPB 1500 mg/300 mL  1,500 mg Intravenous To OR Daveah Varone, Glenice Bow, MD        Allergies  Allergen Reactions  . Ace Inhibitors Swelling  . Albumin (Human)     Potential interaction with Alpha-gal allergy  . Beef-Derived Products Anaphylaxis    Alpha-Gal allergy  . Gelfoam-Jmi Sponge     Alpha-gal allergy.  . Heparin     Potential interaction with alpha-gal allergy  . Pork-Derived Products Anaphylaxis    Alpha-Gal allergy  . Sheep-Derived Products Anaphylaxis    Alpha-Gal allergy  . Tetanus Toxoids Swelling      Review of Systems:   General:  Reduced energy level  Cardiac:  As per HPI  Respiratory:  No sleep apnea  GI:   Denies abdominal pain  GU:   No prostate or kidney disease  Vascular:  History of carotid endarterectomy  Neuro:   No strokes or TIAs  Musculoskeletal: No musculoskeletal disorders  Skin:   Negative  Psych:   Negative  Eyes:   Wears glasses  ENT:   Negative  Hematologic:  Negative  Endocrine:  Type 2 diabetes     Physical Exam:   BP (!) 150/80   Pulse 70   Temp 97.7 F (36.5 C) (Skin)   Resp 20   Ht 5\' 10"  (1.778 m)   Wt 95.3 kg   SpO2 95% Comment: RA  BMI 30.13 kg/m   General:    well-appearing  HEENT:  Unremarkable   Neck:   no JVD, no bruits, no adenopathy   Chest:   clear to auscultation, symmetrical breath sounds, no  wheezes, no rhonchi   CV:   RRR, no detectable murmur   Abdomen:  soft, non-tender, no masses   Extremities:  warm, well-perfused, pulses intact throughout, no LE edema  Rectal/GU  Deferred  Neuro:   Grossly non-focal and symmetrical throughout  Skin:   Clean and dry, no rashes, no breakdown   Diagnostic Tests:  I have personally reviewed his catheterization films and agree with their interpretation   Impression:  70 year old man with preserved LV function.  He has severe multivessel coronary artery disease.   Plan:  Agree with suggestion for CABG as the best medical therapy for multivessel disease. Will complete work-up and tentatively plan OR on 10/18/20  I spent in excess of 30 minutes during the conduct of this office consultation and >50% of this time involved direct face-to-face encounter with the patient for counseling and/or coordination of their care.          Level 3 Office Consult = 40 minutes         Level 4 Office Consult = 60 minutes         Level 5 Office Consult = 80 minutes  B.  Murvin Natal, MD 10/18/2020 7:24 AM

## 2020-10-18 NOTE — Progress Notes (Signed)
EVENING ROUNDS NOTE :     [address]       [place] [address]             [phone]                 Day of Surgery Procedure(s) (LRB): CORONARY ARTERY BYPASS GRAFTING (CABG)x3. LEFT INTERNAL MAMMARY ARTERY. RIGHT ENDOSCOPIC SAPHENOUS VEIN HARVESTING. (N/A) TRANSESOPHAGEAL ECHOCARDIOGRAM (TEE) (N/A)   Total Length of Stay:  LOS: 0 days  Events:   No events Extubated Starting epi for CI 1.4      BP 116/65   Pulse 88   Temp (!) 96.98 F (36.1 C) (Core)   Resp 16   Ht 5\' 10"  (1.778 m)   Wt 93.6 kg   SpO2 98%   BMI 29.61 kg/m   PAP: (23-30)/(1-13) 23/13 CO:  [2.7 L/min-3.1 L/min] 3.1 L/min CI:  [1.3 L/min/m2-1.5 L/min/m2] 1.5 L/min/m2  Vent Mode: PSV;CPAP FiO2 (%):  [40 %-50 %] 40 % Set Rate:  [4 bmp-16 bmp] 4 bmp Vt Set:  [580 mL] 580 mL PEEP:  [5 cmH20] 5 cmH20 Pressure Support:  [10 cmH20] 10 cmH20 Plateau Pressure:  [18 cmH20] 18 cmH20  . sodium chloride 20 mL/hr at 10/18/20 1600  . [START ON 10/19/2020] sodium chloride    . sodium chloride    . cefUROXime (ZINACEF)  IV    . dexmedetomidine (PRECEDEX) IV infusion Stopped (10/18/20 1438)  . electrolyte-A 150 mL/hr at 10/18/20 1600  . epinephrine    . famotidine (PEPCID) IV Stopped (10/18/20 1431)  . insulin 2.4 mL/hr at 10/18/20 1600  . lactated ringers    . lactated ringers 999 mL/hr at 10/18/20 1407  . magnesium sulfate 20 mL/hr at 10/18/20 1600  . nitroGLYCERIN 5 mcg/min (10/18/20 1340)  . phenylephrine (NEO-SYNEPHRINE) Adult infusion 15 mcg/min (10/18/20 1600)  . vancomycin      No intake/output data recorded.   CBC Latest Ref Rng & Units 10/18/2020 10/18/2020 10/18/2020  WBC 4.0 - 10.5 K/uL 18.6(H) - -  Hemoglobin 13.0 - 17.0 g/dL 14.2 10.5(L) 10.2(L)  Hematocrit 39.0 - 52.0 % 38.6(L) 31.0(L) 30.0(L)  Platelets 150 - 400 K/uL 149(L) - -    BMP Latest Ref Rng & Units 10/18/2020 10/18/2020 10/18/2020  Glucose 70 - 99 mg/dL - 133(H) -  BUN 8 - 23 mg/dL - 10 -  Creatinine 0.61  - 1.24 mg/dL - 0.80 -  BUN/Creat Ratio 10 - 24 - - -  Sodium 135 - 145 mmol/L 138 138 136  Potassium 3.5 - 5.1 mmol/L 3.8 3.8 4.1  Chloride 98 - 111 mmol/L - 103 -  CO2 22 - 32 mmol/L - - -  Calcium 8.9 - 10.3 mg/dL - - -    ABG    Component Value Date/Time   PHART 7.373 10/18/2020 1148   PCO2ART 42.1 10/18/2020 1148   PO2ART 327 (H) 10/18/2020 1148   HCO3 24.5 10/18/2020 1148   TCO2 26 10/18/2020 1148   ACIDBASEDEF 1.0 10/18/2020 1148   O2SAT 100.0 10/18/2020 1148       Daniel Schindel, MD 10/18/2020 5:48 PM

## 2020-10-18 NOTE — Anesthesia Procedure Notes (Addendum)
   Central Venous Catheter Insertion Performed by: Darral Dash, DO, anesthesiologist Start/End4/25/2022 7:08 AM, 10/18/2020 7:15 AM Patient location: Pre-op. Preanesthetic checklist: patient identified, IV checked, site marked, risks and benefits discussed, surgical consent, monitors and equipment checked, pre-op evaluation, timeout performed and anesthesia consent Position: Trendelenburg Lidocaine 1% used for infiltration and patient sedated Hand hygiene performed  and maximum sterile barriers used  Catheter size: 8.5 Fr Central line was placed.Sheath introducer Procedure performed using ultrasound guided technique. Ultrasound Notes:anatomy identified, needle tip was noted to be adjacent to the nerve/plexus identified, no ultrasound evidence of intravascular and/or intraneural injection and image(s) printed for medical record Attempts: 1 Following insertion, line sutured, dressing applied and Biopatch. Post procedure assessment: blood return through all ports, free fluid flow and no air  Patient tolerated the procedure well with no immediate complications.

## 2020-10-18 NOTE — Op Note (Signed)
CARDIOTHORACIC SURGERY OPERATIVE NOTE  Date of Procedure: 10/18/2020  Preoperative Diagnosis: Severe 3-vessel Coronary Artery Disease  Postoperative Diagnosis: Same  Procedure:    Coronary Artery Bypass Grafting x 3  Left Internal Mammary Artery to Distal Left Anterior Descending Coronary Artery; Saphenous Vein Graft to Posterior Descending Coronary Artery; Saphenous Vein Graft to ramus intermedius  Coronary Artery; Endoscopic Vein Harvest from right thigh and Lower Leg  Surgeon: B.  Murvin Natal, MD  Assistant: Evonnie Pat, PA-C  Anesthesia: General  Operative Findings:  Preserved left ventricular systolic function  Good quality left internal mammary artery conduit  Good quality saphenous vein conduit  Fair quality target vessels for grafting    BRIEF CLINICAL NOTE AND INDICATIONS FOR SURGERY  70 year old man with history of hypertension diabetes peripheral vascular disease and coronary disease status post MI several years ago presented with fatigue.  He underwent coronary CT was suggestive of multivessel disease.  He underwent left heart catheterization confirming multivessel coronary artery disease.  He is referred for CABG.  He has been thoroughly evaluated preoperatively and is considered a good candidate for the procedure.  He has preserved LV function.   DETAILS OF THE OPERATIVE PROCEDURE  Preparation:  The patient is brought to the operating room on the above mentioned date and central monitoring was established by the anesthesia team including placement of Swan-Ganz catheter and radial arterial line. The patient is placed in the supine position on the operating table.  Intravenous antibiotics are administered. General endotracheal anesthesia is induced uneventfully. A Foley catheter is placed.  Baseline transesophageal echocardiogram was performed.  Findings were notable for preserved LV function and intact valvular function  The patient's chest, abdomen, both groins,  and both lower extremities are prepared and draped in a sterile manner. A time out procedure is performed.  It should be noted that the patient is treated for his alpha gal allergy in anticipation of porcine heparin administration.  This included histamine blockers and corticosteroids.   Surgical Approach and Conduit Harvest:  A median sternotomy incision was performed and the left internal mammary artery is dissected from the chest wall and prepared for bypass grafting. The left internal mammary artery is notably good quality conduit. Simultaneously, the greater saphenous vein is obtained from the patient's right thigh using endoscopic vein harvest technique. The saphenous vein is notably good quality conduit. After removal of the saphenous vein, the small surgical incisions in the lower extremity are closed with absorbable suture. Following systemic heparinization, the left internal mammary artery was transected distally noted to have excellent flow.  Of note, the patient experienced no change in hemodynamic status or ventilator status after delivery of heparin.   Extracorporeal Cardiopulmonary Bypass and Myocardial Protection:  The pericardium is opened. The ascending aorta is nondiseased in appearance. The ascending aorta and the right atrium are cannulated for cardiopulmonary bypass.  Adequate heparinization is verified.    The entire pre-bypass portion of the operation was notable for stable hemodynamics.  Cardiopulmonary bypass was begun and the surface of the heart is inspected. Distal target vessels are selected for coronary artery bypass grafting. A cardioplegia cannula is placed in the ascending aorta.   The patient is allowed to cool passively to 34C systemic temperature.  The aortic cross clamp is applied and cold blood cardioplegia is delivered initially in an antegrade fashion through the aortic root.   The initial cardioplegic arrest is rapid with early diastolic arrest.  Repeat  doses of cardioplegia are administered intermittently throughout the  entire cross clamp portion of the operation through the aortic root and through subsequently placed vein grafts in order to maintain completely flat electrocardiogram.   Coronary Artery Bypass Grafting:   The posterior descending branch of the right coronary artery was grafted using a reversed saphenous vein graft in an end-to-side fashion.  At the site of distal anastomosis the target vessel was fair quality and measured approximately 1.5 mm in diameter.  The second ramus intermedius coronary artery was grafted using a reversed saphenous vein graft in an end-to-side fashion.  At the site of distal anastomosis the target vessel was good quality and measured approximately 1.5 mm in diameter.   The distal left anterior coronary artery was grafted with the left internal mammary artery in an end-to-side fashion.  At the site of distal anastomosis the target vessel was fair quality and measured approximately 1.5 mm in diameter. Anastomotic patency and runoff was confirmed with indocyanine green fluorescence imaging (SPY).  All proximal vein graft anastomoses were placed directly to the ascending aorta prior to removal of the aortic cross clamp.  A reanimation dose of cardioplegia was given followed by de-airing procedures and the aortic cross-clamp was removed.   Procedure Completion:  All proximal and distal coronary anastomoses were inspected for hemostasis and appropriate graft orientation. Epicardial pacing wires are fixed to the right ventricular outflow tract and to the right atrial appendage. The patient is rewarmed to 37C temperature. The patient is weaned and disconnected from cardiopulmonary bypass.  The patient's rhythm at separation from bypass was normal sinus.  The patient was weaned from cardiopulmonary bypass without any inotropic support.   Followup transesophageal echocardiogram performed after separation from  bypass revealed  no changes from the preoperative exam.  The aortic and venous cannula were removed uneventfully. Protamine was administered to reverse the anticoagulation. The mediastinum and pleural space were inspected for hemostasis and irrigated with saline solution. The mediastinum and left pleural space were drained using fluted chest tubes placed through separate stab incisions inferiorly.  The soft tissues anterior to the aorta were reapproximated loosely. The sternum is closed with double strength sternal wire. The soft tissues anterior to the sternum were closed in multiple layers and the skin is closed with a running subcuticular skin closure.  The post-bypass portion of the operation was notable for stable rhythm and hemodynamics.  No blood products were administered during the operation.   Disposition:  The patient tolerated the procedure well and is transported to the surgical intensive care in stable condition. There are no intraoperative complications. All sponge instrument and needle counts are verified correct at completion of the operation.    Jayme Cloud, MD 10/18/2020 1:42 PM

## 2020-10-18 NOTE — Anesthesia Postprocedure Evaluation (Signed)
Anesthesia Post Note  Patient: Daniel Gibson  Procedure(s) Performed: CORONARY ARTERY BYPASS GRAFTING (CABG)x3. LEFT INTERNAL MAMMARY ARTERY. RIGHT ENDOSCOPIC SAPHENOUS VEIN HARVESTING. (N/A Chest) TRANSESOPHAGEAL ECHOCARDIOGRAM (TEE) (N/A )     Patient location during evaluation: SICU Anesthesia Type: General Level of consciousness: sedated Pain management: pain level controlled Vital Signs Assessment: post-procedure vital signs reviewed and stable Respiratory status: patient remains intubated per anesthesia plan Cardiovascular status: stable Postop Assessment: no apparent nausea or vomiting Anesthetic complications: no   No complications documented.  Last Vitals:  Vitals:   10/18/20 1354 10/18/20 1400  BP: 116/62 97/72  Pulse: 80 80  Resp: 16   Temp:  (!) 35.9 C  SpO2: 99% 99%    Last Pain:  Vitals:   10/18/20 1400  TempSrc: Core  PainSc:                  March Rummage Zayli Villafuerte

## 2020-10-18 NOTE — Consult Note (Signed)
Subjective:    Consult requested by Dr. Fredrich Romans.  I was asked to see Daniel Gibson for foley placement.  He is s/p CABG and an attempt at foley placement wasn't felt to be fully successful.  There was only a small amount of urine return and the foley wasn't felt to be fully advanced beyond the prostate.   The patient has a history of prostate cancer treated with seeds in 2012 and was found to have a bulbar stricture in 2015.  He remains on tamsulosin and tolteradine for his voiding symptoms.  He also has a history of phimosis.    ROS:  ROS  Allergies  Allergen Reactions  . Ace Inhibitors Swelling  . Albumin (Human)     Potential interaction with Alpha-gal allergy  . Beef-Derived Products Anaphylaxis    Alpha-Gal allergy  . Gelfoam-Jmi Sponge     Alpha-gal allergy.  . Heparin     Potential interaction with alpha-gal allergy  . Pork-Derived Products Anaphylaxis    Alpha-Gal allergy  . Sheep-Derived Products Anaphylaxis    Alpha-Gal allergy  . Tetanus Toxoids Swelling    Past Medical History:  Diagnosis Date  . Carotid artery disease ([place]) 05/08/2012   CEA on left 2013  . Coronary artery disease   . Diabetes mellitus without complication ([place])   . Food allergy    Alpha Gal allergy   . Hyperlipidemia   . Hypertensive heart disease without CHF 04/08/2018  . Myocardial infarction ([place]) 1993   medically managed  . Obesity (BMI 30-39.9) 04/08/2018  . Prostate cancer ([place]) 08/07/2016  . PVC's (premature ventricular contractions) 04/08/2018  . Thrombocytopenia ([place])     Past Surgical History:  Procedure Laterality Date  . APPENDECTOMY    . CAROTID ENDARTERECTOMY Right 06-07-12   cea  . ENDARTERECTOMY  06/07/2012   Procedure: ENDARTERECTOMY CAROTID;  Surgeon: Angelia Mould, MD;  Location: [facility] OR;  Service: Vascular;  Laterality: Right;  Right Carotid Endarterectomy with Patch Angioplasty  . LEFT HEART CATH AND CORONARY ANGIOGRAPHY N/A 10/07/2020   Procedure:  LEFT HEART CATH AND CORONARY ANGIOGRAPHY;  Surgeon: Troy Sine, MD;  Location: [facility];  Service: Cardiovascular;  Laterality: N/A;  . TRANSPERINEAL IMPLANT OF RADIATION SEEDS W/ ULTRASOUND      Social History   Socioeconomic History  . Marital status: Married    Spouse name: Not on file  . Number of children: Not on file  . Years of education: Not on file  . Highest education level: Not on file  Occupational History  . Not on file  Tobacco Use  . Smoking status: Never Smoker  . Smokeless tobacco: Never Used  Vaping Use  . Vaping Use: Never used  Substance and Sexual Activity  . Alcohol use: No  . Drug use: No  . Sexual activity: Not on file  Other Topics Concern  . Not on file  Social History Narrative  . Not on file   Social Determinants of Health   Financial Resource Strain: Not on file  Food Insecurity: Not on file  Transportation Needs: Not on file  Physical Activity: Not on file  Stress: Not on file  Social Connections: Not on file  Intimate Partner Violence: Not on file    Family History  Problem Relation Age of Onset  . Cancer Mother   . Hyperlipidemia Mother   . Hypertension Mother   . Heart attack Father   . Heart disease Father  Heart Disease before age 92  . Hyperlipidemia Father   . Hypertension Father   . Diabetes Sister   . Heart disease Sister        before age 77  . Hypertension Sister   . Hyperlipidemia Sister   . Diabetes Brother   . Heart disease Brother        before age 53  . Hyperlipidemia Brother   . Hypertension Brother     Anti-infectives: Anti-infectives (From admission, onward)   Start     Dose/Rate Route Frequency Ordered Stop   10/18/20 2000  vancomycin (VANCOCIN) IVPB 1000 mg/200 mL premix        1,000 mg 200 mL/hr over 60 Minutes Intravenous  Once 10/18/20 1334     10/18/20 1700  cefUROXime (ZINACEF) 1.5 g in sodium chloride 0.9 % 100 mL IVPB        1.5 g 200 mL/hr over 30 Minutes Intravenous  Every 12 hours 10/18/20 1334 10/20/20 1659   10/18/20 1007  vancomycin (VANCOCIN) powder  Status:  Discontinued          As needed 10/18/20 1007 10/18/20 1332   10/18/20 0400  vancomycin (VANCOREADY) IVPB 1500 mg/300 mL        1,500 mg 150 mL/hr over 120 Minutes Intravenous To Surgery 10/15/20 1003 10/18/20 0912   10/18/20 0400  cefUROXime (ZINACEF) 1.5 g in sodium chloride 0.9 % 100 mL IVPB        1.5 g 200 mL/hr over 30 Minutes Intravenous To Surgery 10/15/20 1003 10/18/20 0852   10/18/20 0400  cefUROXime (ZINACEF) 750 mg in sodium chloride 0.9 % 100 mL IVPB        750 mg 200 mL/hr over 30 Minutes Intravenous To Surgery 10/15/20 1003 10/18/20 1135      Current Facility-Administered Medications  Medication Dose Route Frequency Provider Last Rate Last Admin  . 0.45 % sodium chloride infusion   Intravenous Continuous PRN Jadene Pierini E, PA-C 20 mL/hr at 10/18/20 1400 New Bag at 10/18/20 1400  . [START ON 10/19/2020] 0.9 %  sodium chloride infusion  250 mL Intravenous Continuous Gold, Wayne E, PA-C      . 0.9 %  sodium chloride infusion   Intravenous Continuous Gold, Wayne E, PA-C      . [START ON 10/19/2020] acetaminophen (TYLENOL) tablet 1,000 mg  1,000 mg Oral Q6H Gold, Wayne E, PA-C       Or  . [START ON 10/19/2020] acetaminophen (TYLENOL) 160 MG/5ML solution 1,000 mg  1,000 mg Per Tube Q6H Gold, Wayne E, PA-C      . acetaminophen (TYLENOL) 160 MG/5ML solution 650 mg  650 mg Per Tube Once Gold, Wayne E, PA-C       Or  . acetaminophen (TYLENOL) suppository 650 mg  650 mg Rectal Once Gold, Wayne E, PA-C      . [START ON 10/19/2020] aspirin EC tablet 325 mg  325 mg Oral Daily Gold, Wayne E, PA-C       Or  . Derrill Memo ON 10/19/2020] aspirin chewable tablet 324 mg  324 mg Per Tube Daily Gold, Wayne E, PA-C      . [START ON 10/19/2020] bisacodyl (DULCOLAX) EC tablet 10 mg  10 mg Oral Daily Gold, Wayne E, PA-C       Or  . Derrill Memo ON 10/19/2020] bisacodyl (DULCOLAX) suppository 10 mg  10 mg Rectal Daily  Gold, Wayne E, PA-C      . cefUROXime (ZINACEF) 1.5 g in sodium chloride 0.9 %  100 mL IVPB  1.5 g Intravenous Q12H Gold, Wayne E, PA-C      . chlorhexidine (PERIDEX) 0.12 % solution 15 mL  15 mL Mouth/Throat NOW Gold, Wayne E, PA-C      . dexmedetomidine (PRECEDEX) 400 MCG/100ML (4 mcg/mL) infusion  0-0.7 mcg/kg/hr Intravenous Continuous Gold, Wayne E, PA-C 11.7 mL/hr at 10/18/20 1400 0.5 mcg/kg/hr at 10/18/20 1400  . dextrose 50 % solution 0-50 mL  0-50 mL Intravenous PRN Gold, Wayne E, PA-C      . [START ON 10/19/2020] docusate sodium (COLACE) capsule 200 mg  200 mg Oral Daily Gold, Wayne E, PA-C      . famotidine (PEPCID) IVPB 20 mg premix  20 mg Intravenous Q12H Gold, Wayne E, PA-C 100 mL/hr at 10/18/20 1401 20 mg at 10/18/20 1401  . insulin regular, human (MYXREDLIN) 100 units/ 100 mL infusion   Intravenous Continuous Gold, Wayne E, PA-C 3.6 mL/hr at 10/18/20 1400 Infusion Verify at 10/18/20 1400  . lactated ringers infusion   Intravenous Continuous Gold, Wayne E, PA-C      . lactated ringers infusion   Intravenous Continuous Gold, Wayne E, PA-C 20 mL/hr at 10/18/20 1400 Infusion Verify at 10/18/20 1400  . magnesium sulfate IVPB 4 g 100 mL  4 g Intravenous Once Gold, Wayne E, PA-C 20 mL/hr at 10/18/20 1404 4 g at 10/18/20 1404  . metoprolol tartrate (LOPRESSOR) tablet 12.5 mg  12.5 mg Oral BID Gold, Wayne E, PA-C       Or  . metoprolol tartrate (LOPRESSOR) 25 mg/10 mL oral suspension 12.5 mg  12.5 mg Per Tube BID Gold, Wayne E, PA-C      . metoprolol tartrate (LOPRESSOR) injection 2.5-5 mg  2.5-5 mg Intravenous Q2H PRN Gold, Wayne E, PA-C      . midazolam (VERSED) injection 2 mg  2 mg Intravenous Q1H PRN Gold, Wayne E, PA-C      . morphine 2 MG/ML injection 1-4 mg  1-4 mg Intravenous Q1H PRN Gold, Wayne E, PA-C      . nitroGLYCERIN 50 mg in dextrose 5 % 250 mL (0.2 mg/mL) infusion  0-100 mcg/min Intravenous Titrated Gold, Wayne E, PA-C 1.5 mL/hr at 10/18/20 1340 5 mcg/min at 10/18/20 1340  .  ondansetron (ZOFRAN) injection 4 mg  4 mg Intravenous Q6H PRN Gold, Wayne E, PA-C      . oxyCODONE (Oxy IR/ROXICODONE) immediate release tablet 5-10 mg  5-10 mg Oral Q3H PRN Gold, Wayne E, PA-C      . [START ON 10/20/2020] pantoprazole (PROTONIX) EC tablet 40 mg  40 mg Oral Daily Gold, Wayne E, PA-C      . phenylephrine (NEOSYNEPHRINE) 20-0.9 MG/250ML-% infusion  0-100 mcg/min Intravenous Titrated Gold, Wayne E, PA-C 15 mL/hr at 10/18/20 1357 20 mcg/min at 10/18/20 1357  . potassium chloride 10 mEq in 50 mL *CENTRAL LINE* IVPB  10 mEq Intravenous Q1 Hr x 3 Gold, Wayne E, PA-C      . [START ON 10/19/2020] sodium chloride flush (NS) 0.9 % injection 3 mL  3 mL Intravenous Q12H Gold, Wayne E, PA-C      . [START ON 10/19/2020] sodium chloride flush (NS) 0.9 % injection 3 mL  3 mL Intravenous PRN Gold, Wayne E, PA-C      . traMADol (ULTRAM) tablet 50-100 mg  50-100 mg Oral Q4H PRN Gold, Wayne E, PA-C      . vancomycin (VANCOCIN) IVPB 1000 mg/200 mL premix  1,000 mg Intravenous Once Gershon CraneGold, Wayne E, PA-C  Objective: Vital signs in last 24 hours: BP 97/72 (BP Location: Right Arm)   Pulse 80   Temp (!) 96.62 F (35.9 C) (Core)   Resp 16   Ht 5\' 10"  (1.778 m)   Wt 93.6 kg   SpO2 99%   BMI 29.61 kg/m   Intake/Output from previous day: No intake/output data recorded. Intake/Output this shift: Total I/O In: 4312.1 [I.V.:3447.1; Blood:365; IV [id number] Out: [id number] [Urine:520; KT:[id number]; Chest Tube:80]   Physical Exam Vitals reviewed.  Genitourinary:    Comments: Uncircumcised with mild/mod phimosis. The meatus is normal.      Lab Results:  Results for orders placed or performed during the hospital encounter of 10/18/20 (from the past 24 hour(s))  Glucose, capillary     Status: Abnormal   Collection Time: 10/18/20  6:20 AM  Result Value Ref Range   Glucose-Capillary 171 (H) 70 - 99 mg/dL  I-STAT, chem 8     Status: Abnormal   Collection Time: 10/18/20  8:38 AM  Result Value  Ref Range   Sodium 137 135 - 145 mmol/L   Potassium 3.8 3.5 - 5.1 mmol/L   Chloride 104 98 - 111 mmol/L   BUN 11 8 - 23 mg/dL   Creatinine, Ser 0.90 0.61 - 1.24 mg/dL   Glucose, Bld 198 (H) 70 - 99 mg/dL   Calcium, Ion 1.28 1.15 - 1.40 mmol/L   TCO2 23 22 - 32 mmol/L   Hemoglobin 12.6 (L) 13.0 - 17.0 g/dL   HCT 37.0 (L) 39.0 - 52.0 %  I-STAT 7, (LYTES, BLD GAS, ICA, H+H)     Status: Abnormal   Collection Time: 10/18/20  8:42 AM  Result Value Ref Range   pH, Arterial 7.279 (L) 7.350 - 7.450   pCO2 arterial 46.9 32.0 - 48.0 mmHg   pO2, Arterial 199 (H) 83.0 - 108.0 mmHg   Bicarbonate 22.0 20.0 - 28.0 mmol/L   TCO2 23 22 - 32 mmol/L   O2 Saturation 100.0 %   Acid-base deficit 5.0 (H) 0.0 - 2.0 mmol/L   Sodium 137 135 - 145 mmol/L   Potassium 3.8 3.5 - 5.1 mmol/L   Calcium, Ion 1.25 1.15 - 1.40 mmol/L   HCT 37.0 (L) 39.0 - 52.0 %   Hemoglobin 12.6 (L) 13.0 - 17.0 g/dL   Sample type ARTERIAL   I-STAT, chem 8     Status: Abnormal   Collection Time: 10/18/20  9:58 AM  Result Value Ref Range   Sodium 137 135 - 145 mmol/L   Potassium 4.2 3.5 - 5.1 mmol/L   Chloride 104 98 - 111 mmol/L   BUN 11 8 - 23 mg/dL   Creatinine, Ser 0.80 0.61 - 1.24 mg/dL   Glucose, Bld 219 (H) 70 - 99 mg/dL   Calcium, Ion 1.30 1.15 - 1.40 mmol/L   TCO2 23 22 - 32 mmol/L   Hemoglobin 11.9 (L) 13.0 - 17.0 g/dL   HCT 35.0 (L) 39.0 - 52.0 %  I-STAT 7, (LYTES, BLD GAS, ICA, H+H)     Status: Abnormal   Collection Time: 10/18/20 10:11 AM  Result Value Ref Range   pH, Arterial 7.337 (L) 7.350 - 7.450   pCO2 arterial 48.3 (H) 32.0 - 48.0 mmHg   pO2, Arterial 379 (H) 83.0 - 108.0 mmHg   Bicarbonate 25.9 20.0 - 28.0 mmol/L   TCO2 27 22 - 32 mmol/L   O2 Saturation 100.0 %   Acid-Base Excess 0.0 0.0 - 2.0 mmol/L   Sodium 137 135 -  145 mmol/L   Potassium 4.1 3.5 - 5.1 mmol/L   Calcium, Ion 1.02 (L) 1.15 - 1.40 mmol/L   HCT 28.0 (L) 39.0 - 52.0 %   Hemoglobin 9.5 (L) 13.0 - 17.0 g/dL   Sample type ARTERIAL    POCT I-Stat EG7     Status: Abnormal   Collection Time: 10/18/20 10:18 AM  Result Value Ref Range   pH, Ven 7.306 7.250 - 7.430   pCO2, Ven 47.8 44.0 - 60.0 mmHg   pO2, Ven 53.0 (H) 32.0 - 45.0 mmHg   Bicarbonate 23.9 20.0 - 28.0 mmol/L   TCO2 25 22 - 32 mmol/L   O2 Saturation 83.0 %   Acid-base deficit 3.0 (H) 0.0 - 2.0 mmol/L   Sodium 137 135 - 145 mmol/L   Potassium 4.0 3.5 - 5.1 mmol/L   Calcium, Ion 1.05 (L) 1.15 - 1.40 mmol/L   HCT 30.0 (L) 39.0 - 52.0 %   Hemoglobin 10.2 (L) 13.0 - 17.0 g/dL   Sample type VENOUS   I-STAT, chem 8     Status: Abnormal   Collection Time: 10/18/20 10:42 AM  Result Value Ref Range   Sodium 135 135 - 145 mmol/L   Potassium 4.4 3.5 - 5.1 mmol/L   Chloride 101 98 - 111 mmol/L   BUN 11 8 - 23 mg/dL   Creatinine, Ser 0.80 0.61 - 1.24 mg/dL   Glucose, Bld 176 (H) 70 - 99 mg/dL   Calcium, Ion 1.13 (L) 1.15 - 1.40 mmol/L   TCO2 23 22 - 32 mmol/L   Hemoglobin 9.5 (L) 13.0 - 17.0 g/dL   HCT 28.0 (L) 39.0 - 52.0 %  Hemoglobin and hematocrit, blood     Status: Abnormal   Collection Time: 10/18/20 11:00 AM  Result Value Ref Range   Hemoglobin 11.0 (L) 13.0 - 17.0 g/dL   HCT 30.3 (L) 39.0 - 52.0 %  Platelet count     Status: None   Collection Time: 10/18/20 11:00 AM  Result Value Ref Range   Platelets 150 150 - 400 K/uL  I-STAT 7, (LYTES, BLD GAS, ICA, H+H)     Status: Abnormal   Collection Time: 10/18/20 11:11 AM  Result Value Ref Range   pH, Arterial 7.393 7.350 - 7.450   pCO2 arterial 39.6 32.0 - 48.0 mmHg   pO2, Arterial 359 (H) 83.0 - 108.0 mmHg   Bicarbonate 24.2 20.0 - 28.0 mmol/L   TCO2 25 22 - 32 mmol/L   O2 Saturation 100.0 %   Acid-base deficit 1.0 0.0 - 2.0 mmol/L   Sodium 136 135 - 145 mmol/L   Potassium 4.1 3.5 - 5.1 mmol/L   Calcium, Ion 1.07 (L) 1.15 - 1.40 mmol/L   HCT 28.0 (L) 39.0 - 52.0 %   Hemoglobin 9.5 (L) 13.0 - 17.0 g/dL   Sample type ARTERIAL   I-STAT, chem 8     Status: Abnormal   Collection Time: 10/18/20 11:44  AM  Result Value Ref Range   Sodium 138 135 - 145 mmol/L   Potassium 3.8 3.5 - 5.1 mmol/L   Chloride 103 98 - 111 mmol/L   BUN 10 8 - 23 mg/dL   Creatinine, Ser 0.80 0.61 - 1.24 mg/dL   Glucose, Bld 133 (H) 70 - 99 mg/dL   Calcium, Ion 1.09 (L) 1.15 - 1.40 mmol/L   TCO2 27 22 - 32 mmol/L   Hemoglobin 10.2 (L) 13.0 - 17.0 g/dL   HCT 30.0 (L) 39.0 - 52.0 %  I-STAT 7, (LYTES, BLD GAS, ICA, H+H)     Status: Abnormal   Collection Time: 10/18/20 11:48 AM  Result Value Ref Range   pH, Arterial 7.373 7.350 - 7.450   pCO2 arterial 42.1 32.0 - 48.0 mmHg   pO2, Arterial 327 (H) 83.0 - 108.0 mmHg   Bicarbonate 24.5 20.0 - 28.0 mmol/L   TCO2 26 22 - 32 mmol/L   O2 Saturation 100.0 %   Acid-base deficit 1.0 0.0 - 2.0 mmol/L   Sodium 138 135 - 145 mmol/L   Potassium 3.8 3.5 - 5.1 mmol/L   Calcium, Ion 1.09 (L) 1.15 - 1.40 mmol/L   HCT 31.0 (L) 39.0 - 52.0 %   Hemoglobin 10.5 (L) 13.0 - 17.0 g/dL   Sample type ARTERIAL   Glucose, capillary     Status: Abnormal   Collection Time: 10/18/20 12:56 PM  Result Value Ref Range   Glucose-Capillary 114 (H) 70 - 99 mg/dL  Glucose, capillary     Status: Abnormal   Collection Time: 10/18/20  1:43 PM  Result Value Ref Range   Glucose-Capillary 128 (H) 70 - 99 mg/dL    BMET Recent Labs    10/18/20 1042 10/18/20 1111 10/18/20 1144 10/18/20 1148  NA 135   < > 138 138  K 4.4   < > 3.8 3.8  CL 101  --  103  --   GLUCOSE 176*  --  133*  --   BUN 11  --  10  --   CREATININE 0.80  --  0.80  --    < > = values in this interval not displayed.   PT/INR No results for input(s): LABPROT, INR in the last 72 hours. ABG Recent Labs    10/18/20 1111 10/18/20 1148  PHART 7.393 7.373  HCO3 24.2 24.5    Studies/Results:  I have reviewed his prior [facility] office notes and he was seen by Dr. Tresa Moore in January.     Procedure:  He had been prepped and draped with betadine and sterile towels.   I removed the 4fr foley which came out with slight resistance  but no blood on the tip although the urine was blood tinged.   I then instilled 50ml of lidocaine jelly and was able to advance a 14 fr temp probe foley into the bladder without difficulty.  The balloon was filled with 43ml of fluid and the urine that drained was clear.   Assessment/Plan: Difficult foley placement.  He has a history of a bulbar stricture and prostate cancer with a seed implant but a 14 fr catheter passed without difficulty.   It can be removed per routine.    CC: Dr. Fredrich Romans.      Irine Seal 10/18/2020 [phone]

## 2020-10-19 ENCOUNTER — Inpatient Hospital Stay (HOSPITAL_COMMUNITY): Payer: Medicare HMO

## 2020-10-19 ENCOUNTER — Encounter (HOSPITAL_COMMUNITY): Payer: Self-pay | Admitting: Cardiothoracic Surgery

## 2020-10-19 LAB — BASIC METABOLIC PANEL
Anion gap: 9 (ref 5–15)
Anion gap: 3 — ABNORMAL LOW (ref 5–15)
BUN: 11 mg/dL (ref 8–23)
BUN: 10 mg/dL (ref 8–23)
CO2: 21 mmol/L — ABNORMAL LOW (ref 22–32)
CO2: 24 mmol/L (ref 22–32)
Calcium: 7.5 mg/dL — ABNORMAL LOW (ref 8.9–10.3)
Calcium: 7.4 mg/dL — ABNORMAL LOW (ref 8.9–10.3)
Chloride: 106 mmol/L (ref 98–111)
Chloride: 105 mmol/L (ref 98–111)
Creatinine, Ser: 1.16 mg/dL (ref 0.61–1.24)
Creatinine, Ser: 1.09 mg/dL (ref 0.61–1.24)
GFR, Estimated: >60 mL/min (ref >60)
GFR, Estimated: >60 mL/min (ref >60)
Glucose, Bld: 130 mg/dL — ABNORMAL HIGH (ref 70–99)
Glucose, Bld: 98 mg/dL (ref 70–99)
Potassium: 4 mmol/L (ref 3.5–5.1)
Potassium: 3.5 mmol/L (ref 3.5–5.1)
Sodium: 136 mmol/L (ref 135–145)
Sodium: 132 mmol/L — ABNORMAL LOW (ref 135–145)

## 2020-10-19 LAB — CBC
HCT: 32.3 % — ABNORMAL LOW (ref 39.0–52.0)
HCT: 28 % — ABNORMAL LOW (ref 39.0–52.0)
Hemoglobin: 11.9 g/dL — ABNORMAL LOW (ref 13.0–17.0)
Hemoglobin: 10 g/dL — ABNORMAL LOW (ref 13.0–17.0)
MCH: 32.2 pg (ref 26.0–34.0)
MCH: 31.6 pg (ref 26.0–34.0)
MCHC: 36.8 g/dL — ABNORMAL HIGH (ref 30.0–36.0)
MCHC: 35.7 g/dL (ref 30.0–36.0)
MCV: 87.5 fL (ref 80.0–100.0)
MCV: 88.6 fL (ref 80.0–100.0)
Platelets: 157 10*3/uL (ref 150–400)
Platelets: UNDETERMINED 10*3/uL (ref 150–400)
RBC: 3.69 MIL/uL — ABNORMAL LOW (ref 4.22–5.81)
RBC: 3.16 MIL/uL — ABNORMAL LOW (ref 4.22–5.81)
RDW: 11.9 % (ref 11.5–15.5)
RDW: 12.1 % (ref 11.5–15.5)
WBC: 20.1 10*3/uL — ABNORMAL HIGH (ref 4.0–10.5)
WBC: 10.2 10*3/uL (ref 4.0–10.5)
nRBC: 0 % (ref 0.0–0.2)
nRBC: 0 % (ref 0.0–0.2)

## 2020-10-19 LAB — GLUCOSE, CAPILLARY
Glucose-Capillary: 125 mg/dL — ABNORMAL HIGH (ref 70–99)
Glucose-Capillary: 128 mg/dL — ABNORMAL HIGH (ref 70–99)
Glucose-Capillary: 129 mg/dL — ABNORMAL HIGH (ref 70–99)
Glucose-Capillary: 130 mg/dL — ABNORMAL HIGH (ref 70–99)
Glucose-Capillary: 131 mg/dL — ABNORMAL HIGH (ref 70–99)
Glucose-Capillary: 138 mg/dL — ABNORMAL HIGH (ref 70–99)
Glucose-Capillary: 141 mg/dL — ABNORMAL HIGH (ref 70–99)
Glucose-Capillary: 145 mg/dL — ABNORMAL HIGH (ref 70–99)
Glucose-Capillary: 145 mg/dL — ABNORMAL HIGH (ref 70–99)
Glucose-Capillary: 147 mg/dL — ABNORMAL HIGH (ref 70–99)
Glucose-Capillary: 199 mg/dL — ABNORMAL HIGH (ref 70–99)
Glucose-Capillary: 204 mg/dL — ABNORMAL HIGH (ref 70–99)
Glucose-Capillary: 221 mg/dL — ABNORMAL HIGH (ref 70–99)

## 2020-10-19 LAB — MAGNESIUM
Magnesium: 2.5 mg/dL — ABNORMAL HIGH (ref 1.7–2.4)
Magnesium: 2.4 mg/dL (ref 1.7–2.4)

## 2020-10-19 MED ORDER — KETOROLAC TROMETHAMINE 15 MG/ML IJ SOLN
7.5000 mg | Freq: Four times a day (QID) | INTRAMUSCULAR | Status: AC
  Administered 2020-10-19 – 2020-10-20 (×5): 7.5 mg via INTRAVENOUS
  Filled 2020-10-19 (×5): qty 1

## 2020-10-19 MED ORDER — POTASSIUM CHLORIDE CRYS ER 20 MEQ PO TBCR
20.0000 meq | EXTENDED_RELEASE_TABLET | ORAL | Status: AC
  Administered 2020-10-19 – 2020-10-20 (×3): 20 meq via ORAL
  Filled 2020-10-19 (×3): qty 1

## 2020-10-19 MED ORDER — LEVALBUTEROL TARTRATE 45 MCG/ACT IN AERO: 2.0000 | INHALATION_SPRAY | Freq: Four times a day (QID) | RESPIRATORY_TRACT | Status: DC

## 2020-10-19 MED ORDER — THIAMINE HCL 100 MG/ML IJ SOLN
Freq: Once | INTRAVENOUS | Status: AC
  Filled 2020-10-19: qty 1000

## 2020-10-19 MED ORDER — COLCHICINE 0.3 MG HALF TABLET
0.3000 mg | ORAL_TABLET | Freq: Two times a day (BID) | ORAL | Status: DC
  Administered 2020-10-19 – 2020-10-22 (×7): 0.3 mg via ORAL
  Filled 2020-10-19 (×9): qty 1

## 2020-10-19 MED ORDER — LEVALBUTEROL HCL 0.63 MG/3ML IN NEBU
0.6300 mg | INHALATION_SOLUTION | Freq: Two times a day (BID) | RESPIRATORY_TRACT | Status: DC
  Administered 2020-10-19: 0.63 mg via RESPIRATORY_TRACT
  Filled 2020-10-19: qty 3

## 2020-10-19 MED ORDER — LEVALBUTEROL HCL 0.63 MG/3ML IN NEBU
0.6300 mg | INHALATION_SOLUTION | Freq: Four times a day (QID) | RESPIRATORY_TRACT | Status: DC
  Administered 2020-10-19: 0.63 mg via RESPIRATORY_TRACT
  Filled 2020-10-19: qty 3

## 2020-10-19 MED FILL — Potassium Chloride Inj 2 mEq/ML: INTRAVENOUS | Qty: 40 | Status: AC

## 2020-10-19 MED FILL — Heparin Sodium (Porcine) Inj 1000 Unit/ML: INTRAMUSCULAR | Qty: 30 | Status: AC

## 2020-10-19 MED FILL — Magnesium Sulfate Inj 50%: INTRAMUSCULAR | Qty: 10 | Status: AC

## 2020-10-19 NOTE — Progress Notes (Signed)
Patient ID: KALLIN HENK, male   DOB: 06/20/1951, 70 y.o.   MRN: [id number]  TCTS Evening Rounds:   Hemodynamically stable off epi. Rhythm is sinus 69   Awake and alert, up in chair.  Urine output good  CT output low  CBC    Component Value Date/Time   WBC 10.2 10/19/2020 1612   RBC 3.16 (L) 10/19/2020 1612   HGB 10.0 (L) 10/19/2020 1612   HGB 15.6 10/04/2020 0839   HCT 28.0 (L) 10/19/2020 1612   HCT 43.9 10/04/2020 0839   PLT PLATELET CLUMPS NOTED ON SMEAR, UNABLE TO ESTIMATE 10/19/2020 1612   PLT 175 10/04/2020 0839   MCV 88.6 10/19/2020 1612   MCV 89 10/04/2020 0839   MCH 31.6 10/19/2020 1612   MCHC 35.7 10/19/2020 1612   RDW 12.1 10/19/2020 1612   RDW 11.9 10/04/2020 0839     BMET    Component Value Date/Time   NA 132 (L) 10/19/2020 1612   NA 137 10/04/2020 0839   K 3.5 10/19/2020 1612   CL 105 10/19/2020 1612   CO2 24 10/19/2020 1612   GLUCOSE 98 10/19/2020 1612   BUN 10 10/19/2020 1612   BUN 12 10/04/2020 0839   CREATININE 1.09 10/19/2020 1612   CALCIUM 7.4 (L) 10/19/2020 1612   GFRNONAA >60 10/19/2020 1612   GFRAA 87 (L) 06/08/2012 0339     A/P:  Stable postop course. Continue current plans

## 2020-10-19 NOTE — Plan of Care (Signed)
  Problem: Respiratory: Goal: Respiratory status will improve Outcome: Progressing   Problem: Urinary Elimination: Goal: Ability to achieve and maintain adequate renal perfusion and functioning will improve Outcome: Progressing   Problem: Cardiac: Goal: Will achieve and/or maintain hemodynamic stability Outcome: Progressing

## 2020-10-19 NOTE — Progress Notes (Signed)
Inpatient Diabetes Program Recommendations  AACE/ADA: New Consensus Statement on Inpatient Glycemic Control (2015)  Target Ranges:  Prepandial:   less than 140 mg/dL      Peak postprandial:   less than 180 mg/dL (1-2 hours)      Critically ill patients:  140 - 180 mg/dL   Lab Results  Component Value Date   GLUCAP 145 (H) 10/19/2020   HGBA1C 8.1 (H) 10/15/2020    Review of Glycemic Control  Inpatient Diabetes Program Recommendations:   Received referral regarding insulin dosing. Consider: -Novolog 0-9 units tid + hs 0-5 units -If CBGs reach > 180, Levemir 10 units bid would = 0.2 units/ks x 99.1 kg.  Thank you, Nani Gasser. Nayeli Calvert, RN, MSN, CDE  Diabetes Coordinator Inpatient Glycemic Control Team Team Pager [phone] (8am-5pm) 10/19/2020 10:03 AM

## 2020-10-19 NOTE — Progress Notes (Signed)
1 Day Post-Op Procedure(s) (LRB): CORONARY ARTERY BYPASS GRAFTING (CABG)x3. LEFT INTERNAL MAMMARY ARTERY. RIGHT ENDOSCOPIC SAPHENOUS VEIN HARVESTING. (N/A) TRANSESOPHAGEAL ECHOCARDIOGRAM (TEE) (N/A) Subjective: No complaints  Objective: Vital signs in last 24 hours: Temp:  [96.08 F (35.6 C)-99.86 F (37.7 C)] 99.14 F (37.3 C) (04/26 0700) Pulse Rate:  [59-92] 88 (04/25 1727) Cardiac Rhythm: Normal sinus rhythm (04/26 0130) Resp:  [11-17] 16 (04/25 1727) BP: (93-150)/(62-87) 129/62 (04/26 0700) SpO2:  [91 %-100 %] 96 % (04/26 0700) Arterial Line BP: (84-160)/(53-69) 147/56 (04/26 0700) FiO2 (%):  [40 %-50 %] 45 % (04/26 0130) Weight:  [99.1 kg] 99.1 kg (04/26 0600)  Hemodynamic parameters for last 24 hours: PAP: (21-39)/(1-25) 27/17 CO:  [2.5 L/min-6.7 L/min] 6.7 L/min CI:  [1.2 L/min/m2-3.2 L/min/m2] 3.2 L/min/m2  Intake/Output from previous day: 04/25 0701 - 04/26 0700 In: 8939.4 [P.O.:360; I.V.:7014.1; Blood:365; IV Piggyback:1200.3] Out: 1610 [Urine:2370; Chest Tube:980] Intake/Output this shift: No intake/output data recorded.  General appearance: alert and cooperative Neurologic: intact Heart: regular rate and rhythm, S1, S2 normal, no murmur, click, rub or gallop Lungs: clear to auscultation bilaterally Abdomen: soft, non-tender; bowel sounds normal; no masses,  no organomegaly Extremities: extremities normal, atraumatic, no cyanosis or edema Wound: dressed, dry  Lab Results: Recent Labs    10/18/20 2004 10/19/20 0350  WBC 26.1* 20.1*  HGB 13.6 11.9*  HCT 37.3* 32.3*  PLT 190 157   BMET:  Recent Labs    10/18/20 2004 10/19/20 0350  NA 135 136  K 3.6 4.0  CL 108 106  CO2 21* 21*  GLUCOSE 153* 130*  BUN 11 11  CREATININE 1.16 1.16  CALCIUM 7.2* 7.5*    PT/INR:  Recent Labs    10/18/20 1450  LABPROT 17.2*  INR 1.4*   ABG    Component Value Date/Time   PHART 7.457 (H) 10/18/2020 1829   HCO3 19.4 (L) 10/18/2020 1829   TCO2 20 (L)  10/18/2020 1829   ACIDBASEDEF 3.0 (H) 10/18/2020 1829   O2SAT 96.0 10/18/2020 1829   CBG (last 3)  Recent Labs    10/19/20 0128 10/19/20 0231 10/19/20 0356  GLUCAP 130* 129* 145*    Assessment/Plan: S/P Procedure(s) (LRB): CORONARY ARTERY BYPASS GRAFTING (CABG)x3. LEFT INTERNAL MAMMARY ARTERY. RIGHT ENDOSCOPIC SAPHENOUS VEIN HARVESTING. (N/A) TRANSESOPHAGEAL ECHOCARDIOGRAM (TEE) (N/A) Mobilize wean epi to off; PA cath out once epi off Out of bed Gentle resuscitation   LOS: 1 day    Wonda Olds 10/19/2020

## 2020-10-19 NOTE — Plan of Care (Signed)
  Problem: Activity: Goal: Risk for activity intolerance will decrease Outcome: Progressing    Problem: Cardiac: Goal: Will achieve and/or maintain hemodynamic stability Outcome: Progressing   Problem: Clinical Measurements: Goal: Postoperative complications will be avoided or minimized Outcome: Progressing   Problem: Respiratory: Goal: Respiratory status will improve Outcome: Progressing     

## 2020-10-20 ENCOUNTER — Inpatient Hospital Stay (HOSPITAL_COMMUNITY): Payer: Medicare HMO

## 2020-10-20 LAB — CBC
HCT: 28.5 % — ABNORMAL LOW (ref 39.0–52.0)
Hemoglobin: 10 g/dL — ABNORMAL LOW (ref 13.0–17.0)
MCH: 31.6 pg (ref 26.0–34.0)
MCHC: 35.1 g/dL (ref 30.0–36.0)
MCV: 90.2 fL (ref 80.0–100.0)
Platelets: 98 10*3/uL — ABNORMAL LOW (ref 150–400)
RBC: 3.16 MIL/uL — ABNORMAL LOW (ref 4.22–5.81)
RDW: 12.1 % (ref 11.5–15.5)
WBC: 10.7 10*3/uL — ABNORMAL HIGH (ref 4.0–10.5)
nRBC: 0 % (ref 0.0–0.2)

## 2020-10-20 LAB — GLUCOSE, CAPILLARY
Glucose-Capillary: 103 mg/dL — ABNORMAL HIGH (ref 70–99)
Glucose-Capillary: 112 mg/dL — ABNORMAL HIGH (ref 70–99)
Glucose-Capillary: 113 mg/dL — ABNORMAL HIGH (ref 70–99)
Glucose-Capillary: 113 mg/dL — ABNORMAL HIGH (ref 70–99)
Glucose-Capillary: 124 mg/dL — ABNORMAL HIGH (ref 70–99)
Glucose-Capillary: 125 mg/dL — ABNORMAL HIGH (ref 70–99)
Glucose-Capillary: 127 mg/dL — ABNORMAL HIGH (ref 70–99)
Glucose-Capillary: 128 mg/dL — ABNORMAL HIGH (ref 70–99)
Glucose-Capillary: 130 mg/dL — ABNORMAL HIGH (ref 70–99)
Glucose-Capillary: 131 mg/dL — ABNORMAL HIGH (ref 70–99)
Glucose-Capillary: 134 mg/dL — ABNORMAL HIGH (ref 70–99)
Glucose-Capillary: 134 mg/dL — ABNORMAL HIGH (ref 70–99)
Glucose-Capillary: 141 mg/dL — ABNORMAL HIGH (ref 70–99)
Glucose-Capillary: 143 mg/dL — ABNORMAL HIGH (ref 70–99)
Glucose-Capillary: 146 mg/dL — ABNORMAL HIGH (ref 70–99)
Glucose-Capillary: 149 mg/dL — ABNORMAL HIGH (ref 70–99)
Glucose-Capillary: 161 mg/dL — ABNORMAL HIGH (ref 70–99)
Glucose-Capillary: 184 mg/dL — ABNORMAL HIGH (ref 70–99)
Glucose-Capillary: 199 mg/dL — ABNORMAL HIGH (ref 70–99)
Glucose-Capillary: 238 mg/dL — ABNORMAL HIGH (ref 70–99)
Glucose-Capillary: 254 mg/dL — ABNORMAL HIGH (ref 70–99)

## 2020-10-20 LAB — BASIC METABOLIC PANEL
Anion gap: 3 — ABNORMAL LOW (ref 5–15)
BUN: 9 mg/dL (ref 8–23)
CO2: 26 mmol/L (ref 22–32)
Calcium: 7.7 mg/dL — ABNORMAL LOW (ref 8.9–10.3)
Chloride: 103 mmol/L (ref 98–111)
Creatinine, Ser: 1.05 mg/dL (ref 0.61–1.24)
GFR, Estimated: >60 mL/min (ref >60)
Glucose, Bld: 113 mg/dL — ABNORMAL HIGH (ref 70–99)
Potassium: 4.2 mmol/L (ref 3.5–5.1)
Sodium: 132 mmol/L — ABNORMAL LOW (ref 135–145)

## 2020-10-20 MED ORDER — INSULIN ASPART 100 UNIT/ML ~~LOC~~ SOLN
0.0000 [IU] | Freq: Every day | SUBCUTANEOUS | Status: DC
  Administered 2020-10-22: 2 [IU] via SUBCUTANEOUS

## 2020-10-20 MED ORDER — FUROSEMIDE 10 MG/ML IJ SOLN
40.0000 mg | Freq: Two times a day (BID) | INTRAMUSCULAR | Status: DC
  Administered 2020-10-20 – 2020-10-21 (×4): 40 mg via INTRAVENOUS
  Filled 2020-10-20 (×5): qty 4

## 2020-10-20 MED ORDER — CHLORHEXIDINE GLUCONATE CLOTH 2 % EX PADS
6.0000 | MEDICATED_PAD | Freq: Every day | CUTANEOUS | Status: DC
  Administered 2020-10-20 – 2020-10-22 (×2): 6 via TOPICAL

## 2020-10-20 MED ORDER — HALOPERIDOL LACTATE 5 MG/ML IJ SOLN: 2.0000 mg | Freq: Four times a day (QID) | INTRAMUSCULAR | Status: DC | PRN

## 2020-10-20 MED ORDER — CLOPIDOGREL BISULFATE 75 MG PO TABS
75.0000 mg | ORAL_TABLET | Freq: Every day | ORAL | Status: DC
  Administered 2020-10-20 – 2020-10-22 (×3): 75 mg via ORAL
  Filled 2020-10-20 (×3): qty 1

## 2020-10-20 MED ORDER — ASPIRIN EC 81 MG PO TBEC
81.0000 mg | DELAYED_RELEASE_TABLET | Freq: Every day | ORAL | Status: DC
  Administered 2020-10-20 – 2020-10-22 (×3): 81 mg via ORAL
  Filled 2020-10-20 (×3): qty 1

## 2020-10-20 MED ORDER — INSULIN ASPART 100 UNIT/ML ~~LOC~~ SOLN
0.0000 [IU] | Freq: Three times a day (TID) | SUBCUTANEOUS | Status: DC
  Administered 2020-10-20 – 2020-10-22 (×5): 2 [IU] via SUBCUTANEOUS
  Administered 2020-10-22: 5 [IU] via SUBCUTANEOUS
  Administered 2020-10-22: 2 [IU] via SUBCUTANEOUS

## 2020-10-20 MED ORDER — LEVALBUTEROL HCL 0.63 MG/3ML IN NEBU: 0.6300 mg | INHALATION_SOLUTION | Freq: Four times a day (QID) | RESPIRATORY_TRACT | Status: DC | PRN

## 2020-10-20 NOTE — Progress Notes (Signed)
2 Days Post-Op Procedure(s) (LRB): CORONARY ARTERY BYPASS GRAFTING (CABG)x3. LEFT INTERNAL MAMMARY ARTERY. RIGHT ENDOSCOPIC SAPHENOUS VEIN HARVESTING. (N/A) TRANSESOPHAGEAL ECHOCARDIOGRAM (TEE) (N/A) Subjective: No complaints  Objective: Vital signs in last 24 hours: Temp:  [97.8 F (36.6 C)-99.32 F (37.4 C)] 97.9 F (36.6 C) (04/27 0400) Pulse Rate:  [56-68] 61 (04/27 0500) Cardiac Rhythm: Normal sinus rhythm (04/27 0400) Resp:  [12-18] 16 (04/27 0600) BP: (94-141)/(50-74) 141/65 (04/27 0600) SpO2:  [90 %-100 %] 95 % (04/27 0600) Arterial Line BP: (115-172)/(41-61) 116/47 (04/26 2300) Weight:  [100.4 kg] 100.4 kg (04/27 0600)  Hemodynamic parameters for last 24 hours: PAP: (25-43)/(11-21) 29/15 CO:  [4.9 L/min-5.7 L/min] 4.9 L/min CI:  [2.3 L/min/m2-2.7 L/min/m2] 2.4 L/min/m2  Intake/Output from previous day: 04/26 0701 - 04/27 0700 In: 2377.1 [P.O.:1140; I.V.:1037.1; IV Piggyback:200] Out: 2160 [Urine:1700; Chest Tube:460] Intake/Output this shift: No intake/output data recorded.  General appearance: alert and cooperative Neurologic: intact Heart: regular rate and rhythm, S1, S2 normal, no murmur, click, rub or gallop Lungs: clear to auscultation bilaterally Abdomen: soft, non-tender; bowel sounds normal; no masses,  no organomegaly Extremities: extremities normal, atraumatic, no cyanosis or edema Wound: c/d/i  Lab Results: Recent Labs    10/19/20 1612 10/20/20 0448  WBC 10.2 10.7*  HGB 10.0* 10.0*  HCT 28.0* 28.5*  PLT PLATELET CLUMPS NOTED ON SMEAR, UNABLE TO ESTIMATE 98*   BMET:  Recent Labs    10/19/20 1612 10/20/20 0448  NA 132* 132*  K 3.5 4.2  CL 105 103  CO2 24 26  GLUCOSE 98 113*  BUN 10 9  CREATININE 1.09 1.05  CALCIUM 7.4* 7.7*    PT/INR:  Recent Labs    10/18/20 1450  LABPROT 17.2*  INR 1.4*   ABG    Component Value Date/Time   PHART 7.457 (H) 10/18/2020 1829   HCO3 19.4 (L) 10/18/2020 1829   TCO2 20 (L) 10/18/2020 1829    ACIDBASEDEF 3.0 (H) 10/18/2020 1829   O2SAT 96.0 10/18/2020 1829   CBG (last 3)  Recent Labs    10/20/20 0453 10/20/20 0608 10/20/20 0736  GLUCAP 113* 125* 130*    Assessment/Plan: S/P Procedure(s) (LRB): CORONARY ARTERY BYPASS GRAFTING (CABG)x3. LEFT INTERNAL MAMMARY ARTERY. RIGHT ENDOSCOPIC SAPHENOUS VEIN HARVESTING. (N/A) TRANSESOPHAGEAL ECHOCARDIOGRAM (TEE) (N/A) Mobilize Diuresis Plan for transfer to step-down: see transfer orders   LOS: 2 days    Daniel Gibson 10/20/2020

## 2020-10-20 NOTE — Progress Notes (Signed)
[facility] BRIEF SICU PROGRESS NOTE  2 Days Post-Op  S/P Procedure(s) (LRB): CORONARY ARTERY BYPASS GRAFTING (CABG)x3. LEFT INTERNAL MAMMARY ARTERY. RIGHT ENDOSCOPIC SAPHENOUS VEIN HARVESTING. (N/A) TRANSESOPHAGEAL ECHOCARDIOGRAM (TEE) (N/A)   Stable day and reportedly sleepy most of the day, but recently woke up and became combative, tried to get up on his own, took several minutes to reorient and calm down Currently awake and alert, oriented, following commands and denies pain or SOB NSR w/ stable BP Breathing comfortably on room air  Plan: Continue current plan.  Change to wireless telemetry.  Consider haldol if agitation recurs  Rexene Alberts, MD 10/20/2020 5:19 PM

## 2020-10-20 NOTE — Progress Notes (Signed)
CARDIAC REHAB PHASE I   PRE:  Rate/Rhythm: 73 SR    BP: sitting 124/62    SaO2: 92 RA  MODE:  Ambulation: 370 ft   POST:  Rate/Rhythm: 82 SR with PVCs    BP: sitting 149/73     SaO2: 93 RA  Pt sleepy and seemingly disoriented upon awaking. Able to move to EOB independently. Stood independently however immediately sat back down, stating "I didn't feel right". Able to rest, then stand and walk with RW. No rest needed, slow and steady pace, VSS. To recliner after walk. Lunch arrived but pt not hungry. Practiced IS but pt unable to control his inspiration to keep small indicator below arrow. Generally tired. Encouraged more IS and another walk today. [place], ACSM 10/20/2020 11:30 AM

## 2020-10-20 NOTE — Progress Notes (Signed)
Patient's art line has been positional now there is no blood return or reading.  Removed art line from Left wrist.

## 2020-10-21 ENCOUNTER — Inpatient Hospital Stay (HOSPITAL_COMMUNITY): Payer: Medicare HMO

## 2020-10-21 LAB — GLUCOSE, CAPILLARY
Glucose-Capillary: 161 mg/dL — ABNORMAL HIGH (ref 70–99)
Glucose-Capillary: 191 mg/dL — ABNORMAL HIGH (ref 70–99)
Glucose-Capillary: 161 mg/dL — ABNORMAL HIGH (ref 70–99)
Glucose-Capillary: 228 mg/dL — ABNORMAL HIGH (ref 70–99)

## 2020-10-21 LAB — CBC
HCT: 29.3 % — ABNORMAL LOW (ref 39.0–52.0)
Hemoglobin: 10.6 g/dL — ABNORMAL LOW (ref 13.0–17.0)
MCH: 32 pg (ref 26.0–34.0)
MCHC: 36.2 g/dL — ABNORMAL HIGH (ref 30.0–36.0)
MCV: 88.5 fL (ref 80.0–100.0)
Platelets: 112 10*3/uL — ABNORMAL LOW (ref 150–400)
RBC: 3.31 MIL/uL — ABNORMAL LOW (ref 4.22–5.81)
RDW: 12.2 % (ref 11.5–15.5)
WBC: 12.6 10*3/uL — ABNORMAL HIGH (ref 4.0–10.5)
nRBC: 0 % (ref 0.0–0.2)

## 2020-10-21 LAB — BASIC METABOLIC PANEL
Anion gap: 8 (ref 5–15)
BUN: 12 mg/dL (ref 8–23)
CO2: 26 mmol/L (ref 22–32)
Calcium: 7.8 mg/dL — ABNORMAL LOW (ref 8.9–10.3)
Chloride: 100 mmol/L (ref 98–111)
Creatinine, Ser: 1.12 mg/dL (ref 0.61–1.24)
GFR, Estimated: >60 mL/min (ref >60)
Glucose, Bld: 149 mg/dL — ABNORMAL HIGH (ref 70–99)
Potassium: 3.4 mmol/L — ABNORMAL LOW (ref 3.5–5.1)
Sodium: 134 mmol/L — ABNORMAL LOW (ref 135–145)

## 2020-10-21 MED ORDER — AMLODIPINE BESYLATE 10 MG PO TABS
10.0000 mg | ORAL_TABLET | Freq: Every morning | ORAL | Status: DC
  Administered 2020-10-22: 10 mg via ORAL
  Filled 2020-10-21: qty 1

## 2020-10-21 MED ORDER — POTASSIUM CHLORIDE CRYS ER 20 MEQ PO TBCR
20.0000 meq | EXTENDED_RELEASE_TABLET | ORAL | Status: AC
  Administered 2020-10-21 (×3): 20 meq via ORAL
  Filled 2020-10-21 (×3): qty 1

## 2020-10-21 MED ORDER — TAMSULOSIN HCL 0.4 MG PO CAPS
0.8000 mg | ORAL_CAPSULE | Freq: Every day | ORAL | Status: DC
  Administered 2020-10-21 – 2020-10-22 (×2): 0.8 mg via ORAL
  Filled 2020-10-21 (×3): qty 2

## 2020-10-21 MED FILL — Electrolyte-R (PH 7.4) Solution: INTRAVENOUS | Qty: 5000 | Status: AC

## 2020-10-21 MED FILL — Heparin Sodium (Porcine) Inj 1000 Unit/ML: INTRAMUSCULAR | Qty: 30 | Status: AC

## 2020-10-21 MED FILL — Mannitol IV Soln 20%: INTRAVENOUS | Qty: 500 | Status: AC

## 2020-10-21 MED FILL — Sodium Chloride IV Soln 0.9%: INTRAVENOUS | Qty: 4000 | Status: AC

## 2020-10-21 MED FILL — Sodium Bicarbonate IV Soln 8.4%: INTRAVENOUS | Qty: 50 | Status: AC

## 2020-10-21 NOTE — Progress Notes (Signed)
Arrived from [facility] via w/c.  CHG bath completed.  CCMD notified-box [id number].  Oriented to room.  Wife at bedside.

## 2020-10-21 NOTE — Hospital Course (Signed)
    HPI:   70 year old man with a history of hyperlipidemia, extracranial cerebrovascular disease, diabetes and status post MI several years ago presents for evaluation of multivessel coronary artery disease.  He has been in his usual state of health until recently when he noted increased fatigue.  He continues to work but that at the end of many days he reports being overly exhausted.  Given his past medical history he underwent CT scan of the chest for coronary anatomy assessment.  This was suggestive of coronary artery disease.  He underwent left heart catheterization demonstrating severe multivessel coronary artery disease and preserved left ventricular function.  He is referred for CABG.  He denies any classic angina.  He denies any peripheral edema.  He denies cardiac dysrhythmias.   Hospital course:  The patient was admitted electively and on 10/18/2020 he was taken to the operating room at which time he underwent coronary artery bypass grafting x3.  He tolerated the procedure well was taken to the surgical intensive care unit in stable condition.  Postoperative hospital course:  The patient was seen early postoperatively by urology due to poor urinary output during the procedure.  It was noted to be a difficult Foley placement as he has a history of bulbar stricture and prostate cancer with previous seed implant.  He was seen by Dr. Jeffie Pollock and a 39 French catheter was placed without difficulty.  He tolerated the procedure well.  The patient was extubated without difficulty using standard post cardiac surgical protocols.  He initially had a low cardiac index and was started on an epinephrine drip.  It was able to be weaned off fairly quickly.  Once weaned, PA catheter and arterial line were discontinued.  Chest tubes were removed and a usual manner.  Cardiac rhythm has been mostly sinus with some occasional PVCs and some junctional rhythm.  Blood pressure has been under good control. He has an  allergy to ACE inhibitors so this or ARB has not been started.  He will be resumed on his preop Norvasc.

## 2020-10-21 NOTE — Discharge Summary (Signed)
Physician Discharge Summary  Patient ID: IZAIAS KRUPKA MRN: [id number] DOB/AGE: [birth year] 70 y.o.  Admit date: 10/18/2020 Discharge date: 10/22/2020  Admission Diagnoses:Fatigue  Discharge Diagnoses:  Active Problems:   S/P CABG x 3   Patient Active Problem List   Diagnosis Date Noted  . S/P CABG x 3 10/18/2020  . Abnormal cardiac CT angiography   . Thrombocytopenia ([place])   . Food allergy   . Diabetes mellitus without complication ([place])   . CAD (coronary artery disease), native coronary artery 04/08/2018  . Hypertensive heart disease without CHF 04/08/2018  . Hyperlipidemia 04/08/2018  . Obesity (BMI 30-39.9) 04/08/2018  . Diabetes mellitus type 2, noninsulin dependent ([place]) 04/08/2018  . Old myocardial infarct 04/08/2018  . PVC's (premature ventricular contractions) 04/08/2018  . Prostate cancer ([place]) 08/07/2016  . Carotid artery disease ([place]) 05/08/2012  . Myocardial infarction ([place]) 1993    HPI:   70 year old man with a history of hyperlipidemia, extracranial cerebrovascular disease, diabetes and status post MI several years ago presents for evaluation of multivessel coronary artery disease.  He has been in his usual state of health until recently when he noted increased fatigue.  He continues to work but that at the end of many days he reports being overly exhausted.  Given his past medical history he underwent CT scan of the chest for coronary anatomy assessment.  This was suggestive of coronary artery disease.  He underwent left heart catheterization demonstrating severe multivessel coronary artery disease and preserved left ventricular function.  He is referred for CABG.  He denies any classic angina.  He denies any peripheral edema.  He denies cardiac dysrhythmias.   Hospital course:  The patient was admitted electively and on 10/18/2020 he was taken to the operating room at which time he underwent coronary artery bypass grafting x3.  He tolerated the procedure well  was taken to the surgical intensive care unit in stable condition.  Postoperative hospital course:  The patient was seen early postoperatively by urology due to poor urinary output during the procedure.  It was noted to be a difficult Foley placement as he has a history of bulbar stricture and prostate cancer with previous seed implant.  He was seen by Dr. Jeffie Pollock and a 46 French catheter was placed without difficulty.  He tolerated the procedure well.  The patient was extubated without difficulty using standard post cardiac surgical protocols.  He initially had a low cardiac index and was started on an epinephrine drip.  It was able to be weaned off fairly quickly.  Once weaned, PA catheter and arterial line were discontinued.  Chest tubes were removed and a usual manner.  Cardiac rhythm has been mostly sinus with some occasional PVCs and some junctional rhythm.  Blood pressure has been under good control. He has an allergy to ACE inhibitors so this or ARB has not been started.  He will be resumed on his preop Norvasc.  He has been resumed on his Glucophage at time of discharge and blood sugars were under good control using standard protocols during hospitalization.  He is being resumed on his beta-blocker at home.  He maintained sinus rhythm with some PVCs.  He has required some potassium replacement.  He did have a moderate volume overload postoperatively but has responded very well to diuretics and they have been discontinued.  Incisions are noted to be healing well without evidence of infection.  Oxygen has been weaned and he maintains good saturations on room air.  He is  doing very well in regard to cardiac rehab modalities.  Overall at the time of discharge the patient is felt to be quite stable.   Discharged Condition: good  Consults: None  Significant Diagnostic Studies: routine post op serial labs/CXR's   LEFT HEART CATH AND CORONARY ANGIOGRAPHY    Conclusion    Prox RCA to Mid RCA  lesion is 80% stenosed.  Ramus-1 lesion is 50% stenosed.  Ramus-2 lesion is 70% stenosed.  1st Mrg lesion is 90% stenosed.  Ost Cx to Prox Cx lesion is 80% stenosed.  Prox LAD lesion is 30% stenosed.  Mid LAD-1 lesion is 70% stenosed.  Mid LAD-2 lesion is 50% stenosed.  The left ventricular ejection fraction is 50-55% by visual estimate.  LV end diastolic pressure is normal.   Significant multivessel coronary calcification and multivessel CAD with calcification of the left main coronary artery, 30% proximal and 70% and 50% mid stenoses; 50% proximal ramus stenosis with 70% mid stenosis; long diffuse high marginal/ramus intermediate like vessel with 90% stenosis, 80% calcified ostial proximal circumflex stenosis with total occlusion of the OM 2 vessel with collateralization, and diffusely diseased 80% small RCA vessel with superior takeoff.  Low normal global LV function with EF estimated 50 to 55% with a subtle area of focal distal inferior hypocontractility.  LVEDP 11 mmHg.  RECOMMENDATION: Recommend surgical consultation for CABG revascularization in this diabetic male with diffuse multivessel stenoses.  Aggressive lipid-lowering therapy with LDL less than 70.  If he is unable to achieve target, recommend PCSK9 inhibition.  The patient will be discharged later today on maximal medical therapy with plan for outpatient surgical consultation for CABG revascularization.  Treatments: surgery:  CARDIOTHORACIC SURGERY OPERATIVE NOTE  Date of Procedure:    10/18/2020  Preoperative Diagnosis:      Severe 3-vessel Coronary Artery Disease  Postoperative Diagnosis:    Same  Procedure:        Coronary Artery Bypass Grafting x 3             Left Internal Mammary Artery to Distal Left Anterior Descending Coronary Artery; Saphenous Vein Graft to Posterior Descending Coronary Artery; Saphenous Vein Graft to ramus intermedius  Coronary Artery; Endoscopic Vein Harvest from right thigh  and Lower Leg  Surgeon:        B.  Murvin Natal, MD  Assistant:       Evonnie Pat, PA-C  Anesthesia:    General  Operative Findings: ? Preserved left ventricular systolic function ? Good quality left internal mammary artery conduit ? Good quality saphenous vein conduit ? Fair quality target vessels for grafting  Discharge Exam: Blood pressure (!) 132/58, pulse 74, temperature 98.4 F (36.9 C), temperature source Oral, resp. rate 17, height 5\' 10"  (1.778 m), weight 94.3 kg, SpO2 96 %.   General appearance: alert, cooperative and no distress Heart: regular rate and rhythm and occ extrasystole Lungs: clear to auscultation bilaterally Abdomen: benign Extremities: no edema Wound: incis healing well   Disposition: Discharge disposition: 01-Home or Self Care      Discharge Instructions    Discharge patient   Complete by: As directed    Discharge disposition: 01-Home or Self Care   Discharge patient date: 10/22/2020     Allergies as of 10/22/2020      Reactions   Ace Inhibitors Swelling   Albumin (human)    Potential interaction with Alpha-gal allergy   Beef-derived Products Anaphylaxis   Alpha-Gal allergy   Gelfoam-jmi Sponge    Alpha-gal  allergy.   Heparin    Potential interaction with alpha-gal allergy   Pork-derived Products Anaphylaxis   Alpha-Gal allergy   Sheep-derived Products Anaphylaxis   Alpha-Gal allergy   Tetanus Toxoids Swelling      Medication List    STOP taking these medications   nitroGLYCERIN 0.4 MG SL tablet Commonly known as: NITROSTAT     TAKE these medications   acetaminophen 500 MG tablet Commonly known as: TYLENOL Take 500-1,000 mg by mouth every 6 (six) hours as needed (pain).   amLODipine 10 MG tablet Commonly known as: NORVASC Take 10 mg by mouth in the morning.   aspirin EC 81 MG tablet Take 81 mg by mouth in the morning. Swallow whole.   carvedilol 25 MG tablet Commonly known as: COREG Take 12.5 mg by mouth 2 (two)  times daily with a meal.   clopidogrel 75 MG tablet Commonly known as: PLAVIX Take 1 tablet (75 mg total) by mouth daily.   colchicine 0.6 MG tablet Take 0.5 tablets (0.3 mg total) by mouth 2 (two) times daily.   EPINEPHrine 0.3 mg/0.3 mL Soaj injection Commonly known as: EpiPen 2-Pak Use as directed for life-threatening allergic reaction.   ezetimibe 10 MG tablet Commonly known as: ZETIA TAKE 1 TABLET BY MOUTH EVERY DAY   fluticasone 50 MCG/ACT nasal spray Commonly known as: FLONASE Place 2 sprays into both nostrils daily as needed for allergies.   metFORMIN 1000 MG tablet Commonly known as: GLUCOPHAGE Take 500 mg by mouth in the morning and at bedtime.   PreviDent 5000 Dry Mouth 1.1 % Gel dental gel Generic drug: sodium fluoride Place 1 application onto teeth 2 (two) times daily.   rosuvastatin 40 MG tablet Commonly known as: CRESTOR Take 1 tablet (40 mg total) by mouth daily.   tamsulosin 0.4 MG Caps capsule Commonly known as: FLOMAX Take 0.8 mg by mouth at bedtime.   traMADol 50 MG tablet Commonly known as: ULTRAM Take 1 tablet (50 mg total) by mouth every 6 (six) hours as needed for moderate pain.   Trospium Chloride 60 MG Cp24 Take 1 capsule (60 mg total) by mouth daily.       Follow-up Information    Wonda Olds, MD Follow up.   Specialty: Cardiothoracic Surgery Why: Please call office to obtain time and date for a 1 week appointment Contact information: [place] [address] [phone]        Troy Sine, MD Follow up.   Specialty: Cardiology Why: See discharge paperwork for follow-up appointment with cardiology. Contact information: [address] [place] [address] [phone]              The patient has been discharged on:   1.Beta Blocker:  Yes [ y  ]                              No   [   ]                              If No, reason:  2.Ace Inhibitor/ARB: Yes [   ]                                      No  [ n   ]  If No, reason:allergy  3.Statin:   Yes [ y  ]                  No  [   ]                  If No, reason:  4.Ecasa:  Yes  Blue.Reese   ]                  No   [   ]                  If No, reason:   [name], PA-C 10/22/2020

## 2020-10-21 NOTE — Progress Notes (Signed)
CARDIAC REHAB PHASE I   PRE:  Rate/Rhythm: 72 SR with PVCs    BP: sitting 128/64 left    SaO2: 96 RA  MODE:  Ambulation: 740 ft   POST:  Rate/Rhythm: 82 SR    BP: sitting 170/86 right     SaO2: 97 RA   Pt stood independently and walked with RW. Steady most of the time but sometimes walked to left side of RW. No c/o. Return to recliner for lunch. Wife present. Gave them O[facility] booklet and reminded him of sternal precautions. He would like RW for home. [place], ACSM 10/21/2020 12:30 PM

## 2020-10-21 NOTE — Progress Notes (Addendum)
T[facility] DAILY ICU PROGRESS NOTE                   [address]            [place] [address]          [phone]   3 Days Post-Op Procedure(s) (LRB): CORONARY ARTERY BYPASS GRAFTING (CABG)x3. LEFT INTERNAL MAMMARY ARTERY. RIGHT ENDOSCOPIC SAPHENOUS VEIN HARVESTING. (N/A) TRANSESOPHAGEAL ECHOCARDIOGRAM (TEE) (N/A)  Total Length of Stay:  LOS: 3 days   Subjective: Feels pretty well  Objective: Vital signs in last 24 hours: Temp:  [97.5 F (36.4 C)-98.4 F (36.9 C)] 97.5 F (36.4 C) (04/28 0700) Pulse Rate:  [68-76] 68 (04/28 0400) Cardiac Rhythm: Normal sinus rhythm;Sinus bradycardia (04/28 0400) Resp:  [12-18] 16 (04/28 0400) BP: (92-158)/(50-84) 137/69 (04/28 0400) SpO2:  [91 %-97 %] 95 % (04/28 0400) Weight:  [95.4 kg] 95.4 kg (04/28 0500)  Filed Weights   10/19/20 0600 10/20/20 0600 10/21/20 0500  Weight: 99.1 kg 100.4 kg 95.4 kg    Weight change: -5 kg   Hemodynamic parameters for last 24 hours:    Intake/Output from previous day: 04/27 0701 - 04/28 0700 In: 382.8 [P.O.:360; I.V.:22.8] Out: 3550 [Urine:3500; Chest Tube:50]  Intake/Output this shift: No intake/output data recorded.  Current Meds: Scheduled Meds: . acetaminophen  1,000 mg Oral Q6H   Or  . acetaminophen (TYLENOL) oral liquid 160 mg/5 mL  1,000 mg Per Tube Q6H  . aspirin EC  81 mg Oral Daily  . bisacodyl  10 mg Oral Daily   Or  . bisacodyl  10 mg Rectal Daily  . Chlorhexidine Gluconate Cloth  6 each Topical Daily  . clopidogrel  75 mg Oral Daily  . colchicine  0.3 mg Oral BID  . docusate sodium  200 mg Oral Daily  . furosemide  40 mg Intravenous BID  . insulin aspart  0-5 Units Subcutaneous QHS  . insulin aspart  0-9 Units Subcutaneous TID WC  . metoprolol tartrate  12.5 mg Oral BID   Or  . metoprolol tartrate  12.5 mg Per Tube BID  . pantoprazole  40 mg Oral Daily  . potassium chloride  20 mEq Oral Q4H   Continuous Infusions: PRN Meds:.dextrose, haloperidol  lactate, levalbuterol, metoprolol tartrate, ondansetron (ZOFRAN) IV, oxyCODONE, traMADol  General appearance: alert, cooperative and no distress Heart: regular rate and rhythm Lungs: min dim in bases Abdomen: benign Extremities: no edema Wound: incis healing well  Lab Results: CBC: Recent Labs    10/20/20 0448 10/21/20 0035  WBC 10.7* 12.6*  HGB 10.0* 10.6*  HCT 28.5* 29.3*  PLT 98* 112*   BMET:  Recent Labs    10/20/20 0448 10/21/20 0035  NA 132* 134*  K 4.2 3.4*  CL 103 100  CO2 26 26  GLUCOSE 113* 149*  BUN 9 12  CREATININE 1.05 1.12  CALCIUM 7.7* 7.8*    CMET: Lab Results  Component Value Date   WBC 12.6 (H) 10/21/2020   HGB 10.6 (L) 10/21/2020   HCT 29.3 (L) 10/21/2020   PLT 112 (L) 10/21/2020   GLUCOSE 149 (H) 10/21/2020   ALT 15 10/15/2020   AST 15 10/15/2020   NA 134 (L) 10/21/2020   K 3.4 (L) 10/21/2020   CL 100 10/21/2020   CREATININE 1.12 10/21/2020   BUN 12 10/21/2020   CO2 26 10/21/2020   INR 1.4 (H) 10/18/2020   HGBA1C 8.1 (H) 10/15/2020      PT/INR:  Recent  Labs    10/18/20 1450  LABPROT 17.2*  INR 1.4*   Radiology: DG Chest 2 View  Result Date: 10/21/2020 CLINICAL DATA:  Cardiac surgery. EXAM: CHEST - 2 VIEW COMPARISON:  10/20/2020. FINDINGS: Interim removal of right IJ sheath, mediastinal drainage catheters, left chest tube. Prior CABG. Cardiomegaly. No pulmonary venous congestion. Improved aeration of both lungs with mild residual bibasilar atelectasis/infiltrates. Small right pleural effusion again noted. No pneumothorax. IMPRESSION: 1. Interim removal of right IJ sheath, mediastinal drainage catheter, left chest tube. No pneumothorax. 2.  Prior CABG.  Stable cardiomegaly. 3. Improved aeration of both lungs with mild residual bibasilar atelectasis/infiltrates. Small right pleural effusion again noted. Electronically Signed   By: Marcello Moores  Register   On: 10/21/2020 06:09     Assessment/Plan: S/P Procedure(s) (LRB): CORONARY ARTERY  BYPASS GRAFTING (CABG)x3. LEFT INTERNAL MAMMARY ARTERY. RIGHT ENDOSCOPIC SAPHENOUS VEIN HARVESTING. (N/A) TRANSESOPHAGEAL ECHOCARDIOGRAM (TEE) (N/A)   1 afeb, SBP 90's to 150's, sinus , some junctional, some PVC's 2 sats good on RA 3 K+ 3.4 , replace 4 normal renal fxn 5 minor leukocytosis 6 expected ABLA is stable 7 CBG adeq control 8 thrombocytopenia is improving 9 CXR- Improved aeration of both lungs with mild residual bibasilar atelectasis/infiltrates. Small right pleural effusion again noted. 10 cont rehab/pulm toilet 11 tx out of unit when bed avail 12 d/c wires    Daniel Giovanni PA-C Pager [phone] 10/21/2020 7:37 AM   Pt seen and examined; agree with documentation. Awaiting tele bed; could likely be discharged tomorrow regardless of bed status. Daniel Vaca Z. Orvan Seen, [place]

## 2020-10-21 NOTE — Discharge Instructions (Signed)
[facility] office [phone]   Endoscopic Saphenous Vein Harvesting, Care After This sheet gives you information about how to care for yourself after your procedure. Your health care provider may also give you more specific instructions. If you have problems or questions, contact your health care provider. What can I expect after the procedure? After the procedure, it is common to have:  Pain.  Bruising.  Swelling.  Numbness. Follow these instructions at home: Incision care  Follow instructions from your health care provider about how to take care of your incisions. Make sure you: ? Wash your hands with soap and water before and after you change your bandages (dressings). If soap and water are not available, use hand sanitizer. ? Change your dressings as told by your health care provider. ? Leave stitches (sutures), skin glue, or adhesive strips in place. These skin closures may need to stay in place for 2 weeks or longer. If adhesive strip edges start to loosen and curl up, you may trim the loose edges. Do not remove adhesive strips completely unless your health care provider tells you to do that.  Check your incision areas every day for signs of infection. Check for: ? More redness, swelling, or pain. ? Fluid or blood. ? Warmth. ? Pus or a bad smell.   Medicines  Take over-the-counter and prescription medicines only as told by your health care provider.  Ask your health care provider if the medicine prescribed to you requires you to avoid driving or using heavy machinery. General instructions  Raise (elevate) your legs above the level of your heart while you are sitting or lying down.  Avoid crossing your legs.  Avoid sitting for long periods of time. Change positions every 30 minutes.  Do any exercises your health care providers have given you. These may include deep breathing, coughing, and walking exercises.  Do not take baths, swim, or use a hot tub until your health care  provider approves. Ask your health care provider if you may take showers. You may only be allowed to take sponge baths.  Wear compression stockings as told by your health care provider. These stockings help to prevent blood clots and reduce swelling in your legs.  Keep all follow-up visits as told by your health care provider. This is important. Contact a health care provider if:  Medicine does not help your pain.  Your pain gets worse.  You have new leg bruises or your leg bruises get bigger.  Your leg feels numb.  You have more redness, swelling, or pain around your incision.  You have fluid or blood coming from your incision.  Your incision feels warm to the touch.  You have pus or a bad smell coming from your incision.  You have a fever. Get help right away if:  Your pain is severe.  You develop pain, tenderness, warmth, redness, or swelling in any part of your leg.  You have chest pain.  You have trouble breathing. Summary  Raise (elevate) your legs above the level of your heart while you are sitting or lying down.  Wear compression stockings as told by your health care provider.  Make sure you know which symptoms should prompt you to contact your health care provider.  Keep all follow-up visits as told by your health care provider. This information is not intended to replace advice given to you by your health care provider. Make sure you discuss any questions you have with your health care provider. Document Revised: 05/20/2018   Document Reviewed: 05/20/2018 Elsevier Patient Education  2021 [employer] Coronary Artery Bypass Grafting, Care After This sheet gives you information about how to care for yourself after your procedure. Your doctor may also give you more specific instructions. If you have problems or questions, call your doctor. What can I expect after the procedure? After the procedure, it is common to:  Feel sick to your stomach (nauseous).  Not  want to eat as much as normal (lack of appetite).  Have trouble pooping (constipation).  Have weakness and tiredness (fatigue).  Feel sad (depressed) or grouchy (irritable).  Have pain or discomfort around the cuts from surgery (incisions). Follow these instructions at home: Medicines  Take over-the-counter and prescription medicines only as told by your doctor. Do not stop taking medicines or start any new medicines unless your doctor says it is okay.  If you were prescribed an antibiotic medicine, take it as told by your doctor. Do not stop taking the antibiotic even if you start to feel better. Incision care  Follow instructions from your doctor about how to take care of your cuts from surgery. Make sure you: ? Wash your hands with soap and water before and after you change your bandage (dressing). If you cannot use soap and water, use hand sanitizer. ? Change your bandage as told by your doctor. ? Leave stitches (sutures), skin glue, or skin tape (adhesive) strips in place. They may need to stay in place for 2 weeks or longer. If tape strips get loose and curl up, you may trim the loose edges. Do not remove tape strips completely unless your doctor says it is okay.  Make sure the surgery cuts are clean, dry, and protected.  Check your cut areas every day for signs of infection. Check for: ? More redness, swelling, or pain. ? More fluid or blood. ? Warmth. ? Pus or a bad smell.  If cuts were made in your legs: ? Avoid crossing your legs. ? Avoid sitting for long periods of time. Change positions every 30 minutes. ? Raise (elevate) your legs when you are sitting.   Bathing  Do not take baths, swim, or use a hot tub until your doctor says it is okay.  You may shower. Pat the surgery cuts dry. Do not rub the cuts to dry.  Eating and drinking  Eat foods that are high in fiber, such as beans, nuts, whole grains, and raw fruits and vegetables. Any meats you eat should be lean  cut. Avoid canned, processed, and fried foods. This can help prevent trouble pooping. This is also a part of a heart-healthy diet.  Drink enough fluid to keep your pee (urine) pale yellow.  Do not drink alcohol until you are fully recovered. Ask your doctor when it is safe to drink alcohol.   Activity  Rest and limit your activity as told by your doctor. You may be told to: ? Stop any activity right away if you have chest pain, shortness of breath, irregular heartbeats, or dizziness. Get help right away if you have any of these symptoms. ? Move around often for short periods or take short walks as told by your doctor. Slowly increase your activities. ? Avoid lifting, pushing, or pulling anything that is heavier than 10 lb (4.5 kg) for at least 6 weeks or as told by your doctor.  Do physical therapy or a cardiac rehab (cardiac rehabilitation) program as told by your doctor. ? Physical therapy involves doing exercises to maintain movement   and build strength and endurance. ? A cardiac rehab program includes:  Exercise training.  Education.  Counseling.  Do not drive until your doctor says it is okay.  Ask your doctor when you can go back to work.  Ask your doctor when you can be sexually active. General instructions  Do not drive or use heavy machinery while taking prescription pain medicine.  Do not use any products that contain nicotine or tobacco. These include cigarettes, e-cigarettes, and chewing tobacco. If you need help quitting, ask your doctor.  Take 2-3 deep breaths every few hours during the day while you get better. This helps expand your lungs and prevent problems.  If you were given a device called an incentive spirometer, use it several times a day to practice deep breathing. Support your chest with a pillow or your arms when you take deep breaths or cough.  Wear compression stockings as told by your doctor.  Weigh yourself every day. This helps to  see if your body is holding (retaining) fluid that may make your heart and lungs work harder.  Keep all follow-up visits as told by your doctor. This is important. Contact a doctor if:  You have more redness, swelling, or pain around any cut.  You have more fluid or blood coming from any cut.  Any cut feels warm to the touch.  You have pus or a bad smell coming from any cut.  You have a fever.  You have swelling in your ankles or legs.  You have pain in your legs.  You gain 2 lb (0.9 kg) or more a day.  You feel sick to your stomach or you throw up (vomit).  You have watery poop (diarrhea). Get help right away if:  You have chest pain that goes to your jaw or arms.  You are short of breath.  You have a fast or irregular heartbeat.  You notice a "clicking" in your breastbone (sternum) when you move.  You have any signs of a stroke. "BE FAST" is an easy way to remember the main warning signs: ? B - Balance. Signs are dizziness, sudden trouble walking, or loss of balance. ? E - Eyes. Signs are trouble seeing or a change in how you see. ? F - Face. Signs are sudden weakness or loss of feeling of the face, or the face or eyelid drooping on one side. ? A - Arms. Signs are weakness or loss of feeling in an arm. This happens suddenly and usually on one side of the body. ? S - Speech. Signs are sudden trouble speaking, slurred speech, or trouble understanding what people say. ? T - Time. Time to call emergency services. Write down what time symptoms started.  You have other signs of a stroke, such as: ? A sudden, very bad headache with no known cause. ? Feeling sick to your stomach. ? Throwing up. ? Jerky movements you cannot control (seizure). These symptoms may be an emergency. Do not wait to see if the symptoms will go away. Get medical help right away. Call your local emergency services (911 in the [place] Do not drive yourself to the hospital. Summary  After the  procedure, it is common to have pain or discomfort in the cuts from surgery (incisions).  Do not take baths, swim, or use a hot tub until your doctor says it is okay.  Slowly increase your activities. You may need physical therapy or cardiac rehab.  Weigh yourself every day. This helps  to see if your body is holding fluid. This information is not intended to replace advice given to you by your health care provider. Make sure you discuss any questions you have with your health care provider. Document Revised: 02/19/2018 Document Reviewed: 02/19/2018 Elsevier Patient Education  2021 [employer].

## 2020-10-22 DIAGNOSIS — R531 Weakness: Secondary | ICD-10-CM | POA: Diagnosis not present

## 2020-10-22 DIAGNOSIS — Z951 Presence of aortocoronary bypass graft: Secondary | ICD-10-CM | POA: Diagnosis not present

## 2020-10-22 DIAGNOSIS — R931 Abnormal findings on diagnostic imaging of heart and coronary circulation: Secondary | ICD-10-CM | POA: Diagnosis not present

## 2020-10-22 LAB — GLUCOSE, CAPILLARY
Glucose-Capillary: 156 mg/dL — ABNORMAL HIGH (ref 70–99)
Glucose-Capillary: 255 mg/dL — ABNORMAL HIGH (ref 70–99)
Glucose-Capillary: 179 mg/dL — ABNORMAL HIGH (ref 70–99)

## 2020-10-22 MED ORDER — CLOPIDOGREL BISULFATE 75 MG PO TABS: 75.0000 mg | ORAL_TABLET | Freq: Every day | ORAL | 1 refills | Status: DC

## 2020-10-22 MED ORDER — COLCHICINE 0.6 MG PO TABS: 0.3000 mg | ORAL_TABLET | Freq: Two times a day (BID) | ORAL | 0 refills | Status: DC

## 2020-10-22 MED ORDER — TRAMADOL HCL 50 MG PO TABS: 50.0000 mg | ORAL_TABLET | Freq: Four times a day (QID) | ORAL | 0 refills | Status: DC | PRN

## 2020-10-22 MED ORDER — METFORMIN HCL 500 MG PO TABS
500.0000 mg | ORAL_TABLET | Freq: Two times a day (BID) | ORAL | Status: DC
  Administered 2020-10-22 (×2): 500 mg via ORAL
  Filled 2020-10-22 (×2): qty 1

## 2020-10-22 NOTE — Progress Notes (Signed)
CARDIAC REHAB PHASE I   PRE:  Rate/Rhythm: 67 SR    BP: sitting 113/58    SaO2: 93 RA  MODE:  Ambulation: 470 ft   POST:  Rate/Rhythm: 82 SR    BP: sitting 150/60     SaO2: 100 RA  Pt gets out of bed well but inconsistent with feet/pace walking with RW. Needs RW for home. To recliner.    Discussed IS, sternal precautions, diet, exercise, and CRPII. Pt voiced understanding and requests his referral be sent to [facility].  [id number]  [place], ACSM 10/22/2020 11:20 AM

## 2020-10-22 NOTE — Progress Notes (Signed)
[address]       [place] [address]             [phone]      4 Days Post-Op Procedure(s) (LRB): CORONARY ARTERY BYPASS GRAFTING (CABG)x3. LEFT INTERNAL MAMMARY ARTERY. RIGHT ENDOSCOPIC SAPHENOUS VEIN HARVESTING. (N/A) TRANSESOPHAGEAL ECHOCARDIOGRAM (TEE) (N/A) Subjective: conts to feel well  Objective: Vital signs in last 24 hours: Temp:  [97.8 F (36.6 C)-98.7 F (37.1 C)] 97.8 F (36.6 C) (04/29 0353) Pulse Rate:  [62-73] 62 (04/29 0500) Cardiac Rhythm: Normal sinus rhythm (04/28 2351) Resp:  [15-16] 15 (04/29 0353) BP: (97-128)/(53-68) 107/53 (04/29 0353) SpO2:  [95 %-98 %] 98 % (04/29 0353) Weight:  [94.3 kg] 94.3 kg (04/29 0353)  Hemodynamic parameters for last 24 hours:    Intake/Output from previous day: 04/28 0701 - 04/29 0700 In: 240 [P.O.:240] Out: 1800 [Urine:1800] Intake/Output this shift: No intake/output data recorded.  General appearance: alert, cooperative and no distress Heart: regular rate and rhythm and occ extrasystole Lungs: clear to auscultation bilaterally Abdomen: benign Extremities: no edema Wound: incis healing well  Lab Results: Recent Labs    10/20/20 0448 10/21/20 0035  WBC 10.7* 12.6*  HGB 10.0* 10.6*  HCT 28.5* 29.3*  PLT 98* 112*   BMET:  Recent Labs    10/20/20 0448 10/21/20 0035  NA 132* 134*  K 4.2 3.4*  CL 103 100  CO2 26 26  GLUCOSE 113* 149*  BUN 9 12  CREATININE 1.05 1.12  CALCIUM 7.7* 7.8*    PT/INR: No results for input(s): LABPROT, INR in the last 72 hours. ABG    Component Value Date/Time   PHART 7.457 (H) 10/18/2020 1829   HCO3 19.4 (L) 10/18/2020 1829   TCO2 20 (L) 10/18/2020 1829   ACIDBASEDEF 3.0 (H) 10/18/2020 1829   O2SAT 96.0 10/18/2020 1829   CBG (last 3)  Recent Labs    10/21/20 1634 10/21/20 2128 10/22/20 0610  GLUCAP 161* 228* 156*    Meds Scheduled Meds: . acetaminophen  1,000 mg Oral Q6H   Or  . acetaminophen (TYLENOL) oral liquid 160 mg/5 mL   1,000 mg Per Tube Q6H  . amLODipine  10 mg Oral q AM  . aspirin EC  81 mg Oral Daily  . bisacodyl  10 mg Oral Daily   Or  . bisacodyl  10 mg Rectal Daily  . Chlorhexidine Gluconate Cloth  6 each Topical Daily  . clopidogrel  75 mg Oral Daily  . colchicine  0.3 mg Oral BID  . docusate sodium  200 mg Oral Daily  . furosemide  40 mg Intravenous BID  . insulin aspart  0-5 Units Subcutaneous QHS  . insulin aspart  0-9 Units Subcutaneous TID WC  . metoprolol tartrate  12.5 mg Oral BID   Or  . metoprolol tartrate  12.5 mg Per Tube BID  . pantoprazole  40 mg Oral Daily  . tamsulosin  0.8 mg Oral QPC supper   Continuous Infusions: PRN Meds:.dextrose, haloperidol lactate, levalbuterol, metoprolol tartrate, ondansetron (ZOFRAN) IV, oxyCODONE, traMADol  Xrays DG Chest 2 View  Result Date: 10/21/2020 CLINICAL DATA:  Cardiac surgery. EXAM: CHEST - 2 VIEW COMPARISON:  10/20/2020. FINDINGS: Interim removal of right IJ sheath, mediastinal drainage catheters, left chest tube. Prior CABG. Cardiomegaly. No pulmonary venous congestion. Improved aeration of both lungs with mild residual bibasilar atelectasis/infiltrates. Small right pleural effusion again noted. No pneumothorax. IMPRESSION: 1. Interim removal of right IJ sheath, mediastinal drainage catheter,  left chest tube. No pneumothorax. 2.  Prior CABG.  Stable cardiomegaly. 3. Improved aeration of both lungs with mild residual bibasilar atelectasis/infiltrates. Small right pleural effusion again noted. Electronically Signed   By: Marcello Moores  Register   On: 10/21/2020 06:09    Assessment/Plan: S/P Procedure(s) (LRB): CORONARY ARTERY BYPASS GRAFTING (CABG)x3. LEFT INTERNAL MAMMARY ARTERY. RIGHT ENDOSCOPIC SAPHENOUS VEIN HARVESTING. (N/A) TRANSESOPHAGEAL ECHOCARDIOGRAM (TEE) (N/A)  1 afeb, VSS, simus with pvc's- will change to home coreg from metoprolol 2 sats good on RA 3 BS adeq control- will resume home glucophage 4 no new labs 5 good UOP, wt around  preop, will d/c furosemide/ resume GU/prostate agents 6 stable for d/c   LOS: 4 days    [name] PA-C Pager [phone] 10/22/2020

## 2020-10-22 NOTE — Plan of Care (Signed)
Problem: Education: Goal: Knowledge of General Education information will improve Description: Including pain rating scale, medication(s)/side effects and non-pharmacologic comfort measures 10/22/2020 1532 by Raelyn Number, RN Outcome: Adequate for Discharge 10/22/2020 1532 by Raelyn Number, RN Outcome: Progressing   Problem: Health Behavior/Discharge Planning: Goal: Ability to manage health-related needs will improve 10/22/2020 1532 by Raelyn Number, RN Outcome: Adequate for Discharge 10/22/2020 1532 by Raelyn Number, RN Outcome: Progressing   Problem: Clinical Measurements: Goal: Ability to maintain clinical measurements within normal limits will improve 10/22/2020 1532 by Raelyn Number, RN Outcome: Adequate for Discharge 10/22/2020 1532 by Raelyn Number, RN Outcome: Progressing Goal: Will remain free from infection 10/22/2020 1532 by Raelyn Number, RN Outcome: Adequate for Discharge 10/22/2020 1532 by Raelyn Number, RN Outcome: Progressing Goal: Diagnostic test results will improve 10/22/2020 1532 by Raelyn Number, RN Outcome: Adequate for Discharge 10/22/2020 1532 by Raelyn Number, RN Outcome: Progressing Goal: Respiratory complications will improve 10/22/2020 1532 by Raelyn Number, RN Outcome: Adequate for Discharge 10/22/2020 1532 by Raelyn Number, RN Outcome: Progressing Goal: Cardiovascular complication will be avoided 10/22/2020 1532 by Raelyn Number, RN Outcome: Adequate for Discharge 10/22/2020 1532 by Raelyn Number, RN Outcome: Progressing   Problem: Activity: Goal: Risk for activity intolerance will decrease 10/22/2020 1532 by Raelyn Number, RN Outcome: Adequate for Discharge 10/22/2020 1532 by Raelyn Number, RN Outcome: Progressing   Problem: Nutrition: Goal: Adequate nutrition will be maintained 10/22/2020 1532 by Raelyn Number, RN Outcome: Adequate for Discharge 10/22/2020 1532 by Raelyn Number, RN Outcome:  Progressing   Problem: Coping: Goal: Level of anxiety will decrease 10/22/2020 1532 by Raelyn Number, RN Outcome: Adequate for Discharge 10/22/2020 1532 by Raelyn Number, RN Outcome: Progressing   Problem: Elimination: Goal: Will not experience complications related to bowel motility 10/22/2020 1532 by Raelyn Number, RN Outcome: Adequate for Discharge 10/22/2020 1532 by Raelyn Number, RN Outcome: Progressing Goal: Will not experience complications related to urinary retention 10/22/2020 1532 by Raelyn Number, RN Outcome: Adequate for Discharge 10/22/2020 1532 by Raelyn Number, RN Outcome: Progressing   Problem: Pain Managment: Goal: General experience of comfort will improve 10/22/2020 1532 by Raelyn Number, RN Outcome: Adequate for Discharge 10/22/2020 1532 by Raelyn Number, RN Outcome: Progressing   Problem: Safety: Goal: Ability to remain free from injury will improve 10/22/2020 1532 by Raelyn Number, RN Outcome: Adequate for Discharge 10/22/2020 1532 by Raelyn Number, RN Outcome: Progressing   Problem: Skin Integrity: Goal: Risk for impaired skin integrity will decrease 10/22/2020 1532 by Raelyn Number, RN Outcome: Adequate for Discharge 10/22/2020 1532 by Raelyn Number, RN Outcome: Progressing   Problem: Education: Goal: Will demonstrate proper wound care and an understanding of methods to prevent future damage 10/22/2020 1532 by Raelyn Number, RN Outcome: Adequate for Discharge 10/22/2020 1532 by Raelyn Number, RN Outcome: Progressing Goal: Knowledge of disease or condition will improve 10/22/2020 1532 by Raelyn Number, RN Outcome: Adequate for Discharge 10/22/2020 1532 by Raelyn Number, RN Outcome: Progressing Goal: Knowledge of the prescribed therapeutic regimen will improve 10/22/2020 1532 by Raelyn Number, RN Outcome: Adequate for Discharge 10/22/2020 1532 by Raelyn Number, RN Outcome: Progressing Goal:  Individualized Educational Video(s) 10/22/2020 1532 by Raelyn Number, RN Outcome: Adequate for Discharge 10/22/2020 1532 by Raelyn Number, RN Outcome: Progressing   Problem: Activity: Goal: Risk for activity intolerance will decrease 10/22/2020 1532  by Raelyn Number, RN Outcome: Adequate for Discharge 10/22/2020 1532 by Raelyn Number, RN Outcome: Progressing   Problem: Cardiac: Goal: Will achieve and/or maintain hemodynamic stability 10/22/2020 1532 by Raelyn Number, RN Outcome: Adequate for Discharge 10/22/2020 1532 by Raelyn Number, RN Outcome: Progressing   Problem: Clinical Measurements: Goal: Postoperative complications will be avoided or minimized 10/22/2020 1532 by Raelyn Number, RN Outcome: Adequate for Discharge 10/22/2020 1532 by Raelyn Number, RN Outcome: Progressing   Problem: Respiratory: Goal: Respiratory status will improve 10/22/2020 1532 by Raelyn Number, RN Outcome: Adequate for Discharge 10/22/2020 1532 by Raelyn Number, RN Outcome: Progressing   Problem: Skin Integrity: Goal: Wound healing without signs and symptoms of infection 10/22/2020 1532 by Raelyn Number, RN Outcome: Adequate for Discharge 10/22/2020 1532 by Raelyn Number, RN Outcome: Progressing Goal: Risk for impaired skin integrity will decrease 10/22/2020 1532 by Raelyn Number, RN Outcome: Adequate for Discharge 10/22/2020 1532 by Raelyn Number, RN Outcome: Progressing   Problem: Urinary Elimination: Goal: Ability to achieve and maintain adequate renal perfusion and functioning will improve 10/22/2020 1532 by Raelyn Number, RN Outcome: Adequate for Discharge 10/22/2020 1532 by Raelyn Number, RN Outcome: Progressing

## 2020-10-22 NOTE — Care Management Important Message (Signed)
Important Message  Patient Details  Name: TWAN HARKIN MRN: [id number] Date of Birth: 01/25/1951   Medicare Important Message Given:  Yes     Shelda Altes 10/22/2020, 9:32 AM

## 2020-10-22 NOTE — Progress Notes (Signed)
Pt discharged home with family. AVS reviewed with pt and all questions answered. IV and telemetry box removed. Pt left with all belongings. Left via wheelchair and accompanied by staff.

## 2020-10-22 NOTE — Progress Notes (Signed)
Mobility Specialist: Progress Note   10/22/20 1441  Mobility  Activity Ambulated in hall  Level of Assistance Independent after set-up  Assistive Device Front wheel walker  Distance Ambulated (ft) 320 ft  Mobility Response Tolerated well  Mobility performed by Mobility specialist  $Mobility charge 1 Mobility   Pre-Mobility: 67 HR Post-Mobility: 71 HR, 97% SpO2  Pt asx during ambulation. Pt says he's hopeful for discharge later this evening.   [facility] Daniel Gibson Mobility Specialist Mobility Specialist Phone: [phone]

## 2020-10-25 ENCOUNTER — Other Ambulatory Visit: Payer: Self-pay | Admitting: Cardiothoracic Surgery

## 2020-10-25 DIAGNOSIS — Z951 Presence of aortocoronary bypass graft: Secondary | ICD-10-CM

## 2020-10-26 ENCOUNTER — Other Ambulatory Visit: Payer: Self-pay

## 2020-10-26 ENCOUNTER — Ambulatory Visit (INDEPENDENT_AMBULATORY_CARE_PROVIDER_SITE_OTHER): Payer: Self-pay | Admitting: Cardiothoracic Surgery

## 2020-10-26 ENCOUNTER — Ambulatory Visit
Admission: RE | Admit: 2020-10-26 | Discharge: 2020-10-26 | Disposition: A | Discharge status: Home / Self Care | Payer: Medicare HMO | Source: Ambulatory Visit | Attending: Cardiothoracic Surgery | Admitting: Cardiothoracic Surgery

## 2020-10-26 VITALS — BP 118/70 | HR 64 | Resp 20 | Ht 70.0 in | Wt 205.0 lb

## 2020-10-26 DIAGNOSIS — I517 Cardiomegaly: Secondary | ICD-10-CM | POA: Diagnosis not present

## 2020-10-26 DIAGNOSIS — J90 Pleural effusion, not elsewhere classified: Secondary | ICD-10-CM | POA: Diagnosis not present

## 2020-10-26 DIAGNOSIS — Z951 Presence of aortocoronary bypass graft: Secondary | ICD-10-CM

## 2020-10-26 DIAGNOSIS — J9811 Atelectasis: Secondary | ICD-10-CM | POA: Diagnosis not present

## 2020-10-26 DIAGNOSIS — Z9889 Other specified postprocedural states: Secondary | ICD-10-CM | POA: Diagnosis not present

## 2020-10-26 NOTE — Progress Notes (Signed)
[address]       [place], [address]             [phone]     CARDIOTHORACIC SURGERY OFFICE NOTE  Referring Provider is Troy Sine, MD Primary Cardiologist is None PCP is Cyndi Bender, Hershal Coria   HPI:  70 year old man underwent elective coronary artery bypass grafting approximately 10 days ago.  He returns for his initial postoperative visit.  He is without complaints except that his appetite has been a little poor.  He denies chest pain shortness of breath fevers or chills.   Current Outpatient Medications  Medication Sig Dispense Refill  . acetaminophen (TYLENOL) 500 MG tablet Take 500-1,000 mg by mouth every 6 (six) hours as needed (pain).    [name] amLODipine (NORVASC) 10 MG tablet Take 10 mg by mouth in the morning.    [name] aspirin EC 81 MG tablet Take 81 mg by mouth in the morning. Swallow whole.    . carvedilol (COREG) 25 MG tablet Take 12.5 mg by mouth 2 (two) times daily with a meal.    . clopidogrel (PLAVIX) 75 MG tablet Take 1 tablet (75 mg total) by mouth daily. 30 tablet 1  . EPINEPHrine (EPIPEN 2-PAK) 0.3 mg/0.3 mL IJ SOAJ injection Use as directed for life-threatening allergic reaction. 2 each 3  . ezetimibe (ZETIA) 10 MG tablet TAKE 1 TABLET BY MOUTH EVERY DAY 90 tablet 3  . fluticasone (FLONASE) 50 MCG/ACT nasal spray Place 2 sprays into both nostrils daily as needed for allergies.    . metFORMIN (GLUCOPHAGE) 1000 MG tablet Take 500 mg by mouth in the morning and at bedtime.    [name] PREVIDENT 5000 DRY MOUTH 1.1 % GEL dental gel Place 1 application onto teeth 2 (two) times daily.    . rosuvastatin (CRESTOR) 40 MG tablet Take 1 tablet (40 mg total) by mouth daily. 90 tablet 3  . Tamsulosin HCl (FLOMAX) 0.4 MG CAPS Take 0.8 mg by mouth at bedtime.    . traMADol (ULTRAM) 50 MG tablet Take 1 tablet (50 mg total) by mouth every 6 (six) hours as needed for moderate pain. 28 tablet 0  . Trospium Chloride 60 MG CP24 Take 1 capsule (60 mg total) by mouth  daily. 90 capsule 0   No current facility-administered medications for this visit.      Physical Exam:   BP 118/70   Pulse 64   Resp 20   Ht 5\' 10"  (1.778 m)   Wt 93 kg   SpO2 96% Comment: RA  BMI 29.41 kg/m   General:  Well-appearing no acute distress  Chest:   Clear to auscultation bilaterally  CV:   Regular rate and rhythm  Incisions:  Healing well with mild erythema at the lower aspect of the sternal incision, suspect irritation from subcuticular suture  Abdomen:  Soft nontender  Extremities:  No edema  Diagnostic Tests:  Chest x-ray with clear lung fields and stable mediastinal silhouette   Impression:  Doing well after CABG  Plan:  Stop colchicine today Follow-up as needed with thoracic surgery  I spent in excess of 15 minutes during the conduct of this office consultation and >50% of this time involved direct face-to-face encounter with the patient for counseling and/or coordination of their care.  Level 2                 10 minutes Level 3  15 minutes Level 4                 25 minutes Level 5                 40 minutes  B.  Murvin Natal, MD 10/26/2020 4:34 PM

## 2020-10-29 ENCOUNTER — Telehealth (HOSPITAL_COMMUNITY): Payer: Self-pay

## 2020-10-29 NOTE — Telephone Encounter (Signed)
Cardiac rehab referral for Ph.II faxed to [place]. 

## 2020-11-10 ENCOUNTER — Ambulatory Visit: Payer: Medicare HMO | Admitting: Vascular Surgery

## 2020-11-10 ENCOUNTER — Encounter (HOSPITAL_COMMUNITY): Payer: Medicare HMO

## 2020-11-14 ENCOUNTER — Other Ambulatory Visit: Payer: Self-pay | Admitting: Surgical

## 2020-11-19 DIAGNOSIS — I2510 Atherosclerotic heart disease of native coronary artery without angina pectoris: Secondary | ICD-10-CM | POA: Insufficient documentation

## 2020-11-28 NOTE — Progress Notes (Signed)
Cardiology Office Note:    Date:  11/29/2020   ID:  Daniel Gibson, DOB 1950-11-05, MRN [id number]  PCP:  Cyndi Bender, PA-C  Cardiologist:  Shirlee More, MD    Referring MD: Cyndi Bender, PA-C    ASSESSMENT:    1. Coronary artery disease involving native heart with angina pectoris, unspecified vessel or lesion type (JAARS)   2. Hypertensive heart disease without CHF   3. Hyperlipidemia, unspecified hyperlipidemia type   4. PVC's (premature ventricular contractions)   5. Type 2 diabetes mellitus with complication, without long-term current use of insulin (HCC)   6. Carotid stenosis, bilateral    PLAN:    In order of problems listed above:  1. Overall was doing well stable and appeared recovered from his CABG I suspect he is having GI symptoms related to dual antiplatelet therapy will drop out aspirin continue clopidogrel placement of PPI and if not improved later in the week refer to GI.  Check labs including a CBC for subtle anemia and bleeding 2. Stable BP at target continue current treatment 3. Continue his high intensity statin and Zetia check lipid profile  4. Stable diabetes 5. Stable carotid disease   Next appointment: He has a scheduled appointment July 6   Medication Adjustments/Labs and Tests Ordered: Current medicines are reviewed at length with the patient today.  Concerns regarding medicines are outlined above.  No orders of the defined types were placed in this encounter.  No orders of the defined types were placed in this encounter.   Chief Complaint  Patient presents with  . Follow-up  . Coronary Artery Disease    History of Present Illness:    Daniel Gibson is a 70 y.o. male with a hx of CAD hypertension hyperlipidemia PVCs and carotid artery stenosis last seen 10/06/2020 after cardiac CTA showed significant multivessel CAD.[name]  Compliance with diet, lifestyle and medications: Yes His wife is present participates in evaluation decision  making He did well until last week when he developed nausea and is vomited once lost his appetite. He has no known history of ulcer disease he is on dual antiplatelet therapy. Overall he feels weak He has had no obvious bleeding. His wife said and before surgery he had a poor appetite. He has not returned to work.  He underwent elective CABG 10/18/2020 the left internal thoracic artery was anastomosed to the LAD vein graft to the posterior descending artery vein graft to the ramus.  His postoperative course was unremarkable. Carotid duplex prior to CABG showed right ICA stenosis 40 to 59% left ICA stenosis 40 to 59% and high resistance flow antegrade in the right vertebral artery.  His echocardiogram showed EF 55 to 60%. Past Medical History:  Diagnosis Date  . Carotid artery disease ([place]) 05/08/2012   CEA on left 2013  . Coronary artery disease   . Diabetes mellitus without complication ([place])   . Food allergy    Alpha Gal allergy   . Hyperlipidemia   . Hypertensive heart disease without CHF 04/08/2018  . Myocardial infarction ([place]) 1993   medically managed  . Obesity (BMI 30-39.9) 04/08/2018  . Prostate cancer ([place]) 08/07/2016  . PVC's (premature ventricular contractions) 04/08/2018  . Thrombocytopenia ([place])     Past Surgical History:  Procedure Laterality Date  . APPENDECTOMY    . CAROTID ENDARTERECTOMY Right 06-07-12   cea  . CORONARY ARTERY BYPASS GRAFT N/A 10/18/2020   Procedure: CORONARY ARTERY BYPASS GRAFTING (CABG)x3. LEFT INTERNAL MAMMARY ARTERY. RIGHT ENDOSCOPIC  SAPHENOUS VEIN HARVESTING.;  Surgeon: Wonda Olds, MD;  Location: [facility];  Service: Open Heart Surgery;  Laterality: N/A;  . ENDARTERECTOMY  06/07/2012   Procedure: ENDARTERECTOMY CAROTID;  Surgeon: Angelia Mould, MD;  Location[facility] OR;  Service: Vascular;  Laterality: Right;  Right Carotid Endarterectomy with Patch Angioplasty  . LEFT HEART CATH AND CORONARY ANGIOGRAPHY N/A 10/07/2020   Procedure:  LEFT HEART CATH AND CORONARY ANGIOGRAPHY;  Surgeon: Troy Sine, MD;  Location: [facility] CV LAB;  Service: Cardiovascular;  Laterality: N/A;  . TEE WITHOUT CARDIOVERSION N/A 10/18/2020   Procedure: TRANSESOPHAGEAL ECHOCARDIOGRAM (TEE);  Surgeon: Wonda Olds, MD;  Location: [facility];  Service: Open Heart Surgery;  Laterality: N/A;  . TRANSPERINEAL IMPLANT OF RADIATION SEEDS W/ ULTRASOUND      Current Medications: Current Meds  Medication Sig  . acetaminophen (TYLENOL) 500 MG tablet Take 500-1,000 mg by mouth every 6 (six) hours as needed (pain).  [name] amLODipine (NORVASC) 10 MG tablet Take 10 mg by mouth in the morning.  [name] aspirin EC 81 MG tablet Take 81 mg by mouth in the morning. Swallow whole.  . carvedilol (COREG) 25 MG tablet Take 12.5 mg by mouth 2 (two) times daily with a meal.  . clopidogrel (PLAVIX) 75 MG tablet Take 1 tablet (75 mg total) by mouth daily.  [name] EPINEPHrine (EPIPEN 2-PAK) 0.3 mg/0.3 mL IJ SOAJ injection Use as directed for life-threatening allergic reaction.  [name] ezetimibe (ZETIA) 10 MG tablet TAKE 1 TABLET BY MOUTH EVERY DAY  . fluticasone (FLONASE) 50 MCG/ACT nasal spray Place 2 sprays into both nostrils daily as needed for allergies.  . metFORMIN (GLUCOPHAGE) 1000 MG tablet Take 500 mg by mouth in the morning and at bedtime.  [name] PREVIDENT 5000 DRY MOUTH 1.1 % GEL dental gel Place 1 application onto teeth 2 (two) times daily.  . rosuvastatin (CRESTOR) 40 MG tablet Take 1 tablet (40 mg total) by mouth daily.  . Tamsulosin HCl (FLOMAX) 0.4 MG CAPS Take 0.8 mg by mouth at bedtime.  . traMADol (ULTRAM) 50 MG tablet Take 1 tablet (50 mg total) by mouth every 6 (six) hours as needed for moderate pain.  . Trospium Chloride 60 MG CP24 Take 1 capsule (60 mg total) by mouth daily.     Allergies:   Ace inhibitors, Albumin (human), Beef-derived products, Gelfoam-jmi sponge, Heparin, Pork-derived products, Sheep-derived products, and Tetanus toxoids   Social History    Socioeconomic History  . Marital status: Married    Spouse name: Not on file  . Number of children: Not on file  . Years of education: Not on file  . Highest education level: Not on file  Occupational History  . Not on file  Tobacco Use  . Smoking status: Never Smoker  . Smokeless tobacco: Never Used  Vaping Use  . Vaping Use: Never used  Substance and Sexual Activity  . Alcohol use: No  . Drug use: No  . Sexual activity: Not on file  Other Topics Concern  . Not on file  Social History Narrative  . Not on file   Social Determinants of Health   Financial Resource Strain: Not on file  Food Insecurity: Not on file  Transportation Needs: Not on file  Physical Activity: Not on file  Stress: Not on file  Social Connections: Not on file     Family History: The patient's family history includes Cancer in his mother; Diabetes in his brother and sister; Heart attack in his father;  Heart disease in his brother, father, and sister; Hyperlipidemia in his brother, father, mother, and sister; Hypertension in his brother, father, mother, and sister. ROS:   Please see the history of present illness.    All other systems reviewed and are negative.  EKGs/Labs/Other Studies Reviewed:    The following studies were reviewed today:    Recent Labs: 10/15/2020: ALT 15 10/19/2020: Magnesium 2.4 10/21/2020: BUN 12; Creatinine, Ser 1.12; Hemoglobin 10.6; Platelets 112; Potassium 3.4; Sodium 134  Recent Lipid Panel 07/26/2020, cholesterol 170 LDL 118 triglycerides 76 HDL 38  Physical Exam:    VS:  BP 114/62   Pulse 80   Ht 5\' 10"  (1.778 m)   Wt 183 lb (83 kg)   SpO2 97%   BMI 26.26 kg/m     Wt Readings from Last 3 Encounters:  11/29/20 183 lb (83 kg)  10/26/20 205 lb (93 kg)  10/22/20 207 lb 14.4 oz (94.3 kg)     GEN: No pallor well nourished, well developed in no acute distress HEENT: Normal NECK: No JVD; No carotid bruits LYMPHATICS: No lymphadenopathy CARDIAC: RRR, no  murmurs, rubs, gallops RESPIRATORY:  Clear to auscultation without rales, wheezing or rhonchi  ABDOMEN: Soft, non-tender, non-distended MUSCULOSKELETAL:  No edema; No deformity  SKIN: Warm and dry NEUROLOGIC:  Alert and oriented x 3 PSYCHIATRIC:  Normal affect    Signed, Shirlee More, MD  11/29/2020 10:26 AM    [facility]

## 2020-11-29 ENCOUNTER — Other Ambulatory Visit: Payer: Self-pay

## 2020-11-29 ENCOUNTER — Ambulatory Visit: Payer: Medicare HMO | Admitting: Cardiology

## 2020-11-29 ENCOUNTER — Encounter: Payer: Self-pay | Admitting: Cardiology

## 2020-11-29 VITALS — BP 114/62 | HR 80 | Ht 70.0 in | Wt 183.0 lb

## 2020-11-29 DIAGNOSIS — I493 Ventricular premature depolarization: Secondary | ICD-10-CM

## 2020-11-29 DIAGNOSIS — I6523 Occlusion and stenosis of bilateral carotid arteries: Secondary | ICD-10-CM

## 2020-11-29 DIAGNOSIS — I119 Hypertensive heart disease without heart failure: Secondary | ICD-10-CM | POA: Diagnosis not present

## 2020-11-29 DIAGNOSIS — E118 Type 2 diabetes mellitus with unspecified complications: Secondary | ICD-10-CM

## 2020-11-29 DIAGNOSIS — E785 Hyperlipidemia, unspecified: Secondary | ICD-10-CM

## 2020-11-29 DIAGNOSIS — I25119 Atherosclerotic heart disease of native coronary artery with unspecified angina pectoris: Secondary | ICD-10-CM | POA: Diagnosis not present

## 2020-11-29 LAB — COMPREHENSIVE METABOLIC PANEL
ALT: 8 IU/L (ref 0–44)
AST: 11 IU/L (ref 0–40)
Albumin/Globulin Ratio: 2.2 (ref 1.2–2.2)
Albumin: 4.4 g/dL (ref 3.8–4.8)
Alkaline Phosphatase: 92 IU/L (ref 44–121)
BUN/Creatinine Ratio: 12 (ref 10–24)
BUN: 13 mg/dL (ref 8–27)
Bilirubin Total: 0.6 mg/dL (ref 0.0–1.2)
CO2: 23 mmol/L (ref 20–29)
Calcium: 9.7 mg/dL (ref 8.6–10.2)
Chloride: 98 mmol/L (ref 96–106)
Creatinine, Ser: 1.12 mg/dL (ref 0.76–1.27)
Globulin, Total: 2 g/dL (ref 1.5–4.5)
Glucose: 173 mg/dL — ABNORMAL HIGH (ref 65–99)
Potassium: 4.4 mmol/L (ref 3.5–5.2)
Sodium: 136 mmol/L (ref 134–144)
Total Protein: 6.4 g/dL (ref 6.0–8.5)
eGFR: 71 mL/min/{1.73_m2} (ref >59)

## 2020-11-29 LAB — CBC
Hematocrit: 42.3 % (ref 37.5–51.0)
Hemoglobin: 14.2 g/dL (ref 13.0–17.7)
MCH: 29.2 pg (ref 26.6–33.0)
MCHC: 33.6 g/dL (ref 31.5–35.7)
MCV: 87 fL (ref 79–97)
Platelets: 201 10*3/uL (ref 150–450)
RBC: 4.87 x10E6/uL (ref 4.14–5.80)
RDW: 12.8 % (ref 11.6–15.4)
WBC: 8.6 10*3/uL (ref 3.4–10.8)

## 2020-11-29 LAB — LIPID PANEL
Chol/HDL Ratio: 3 ratio (ref 0.0–5.0)
Cholesterol, Total: 117 mg/dL (ref 100–199)
HDL: 39 mg/dL — ABNORMAL LOW (ref >39)
LDL Chol Calc (NIH): 58 mg/dL (ref 0–99)
Triglycerides: 107 mg/dL (ref 0–149)
VLDL Cholesterol Cal: 20 mg/dL (ref 5–40)

## 2020-11-29 MED ORDER — PANTOPRAZOLE SODIUM 40 MG PO TBEC: 40.0000 mg | DELAYED_RELEASE_TABLET | Freq: Every day | ORAL | 11 refills | Status: DC

## 2020-11-29 MED ORDER — EZETIMIBE 10 MG PO TABS: 10.0000 mg | ORAL_TABLET | Freq: Every day | ORAL | 3 refills | Status: DC

## 2020-11-29 MED ORDER — CLOPIDOGREL BISULFATE 75 MG PO TABS: 75.0000 mg | ORAL_TABLET | Freq: Every day | ORAL | 3 refills | Status: DC

## 2020-11-29 NOTE — Patient Instructions (Signed)
Medication Instructions:  Your physician has recommended you make the following change in your medication:  STOP: Aspirin  START: Protonix 40 mg take one tablet by mouth daily.  *If you need a refill on your cardiac medications before your next appointment, please call your pharmacy*   Lab Work: Your physician recommends that you return for lab work in: TODAY CBC, CMP, Lipids If you have labs (blood work) drawn today and your tests are completely normal, you will receive your results only by: [name] MyChart Message (if you have MyChart) OR . A paper copy in the mail If you have any lab test that is abnormal or we need to change your treatment, we will call you to review the results.   Testing/Procedures: None   Follow-Up: At [facility], you and your health needs are our priority.  As part of our continuing mission to provide you with exceptional heart care, we have created designated Provider Care Teams.  These Care Teams include your primary Cardiologist (physician) and Advanced Practice Providers (APPs -  Physician Assistants and Nurse Practitioners) who all work together to provide you with the care you need, when you need it.  We recommend signing up for the patient portal called "MyChart".  Sign up information is provided on this After Visit Summary.  MyChart is used to connect with patients for Virtual Visits (Telemedicine).  Patients are able to view lab/test results, encounter notes, upcoming appointments, etc.  Non-urgent messages can be sent to your provider as well.   To learn more about what you can do with MyChart, go to [email or web address].    Your next appointment:   4 week(s)  The format for your next appointment:   In Person  Provider:   Shirlee More, MD   Other Instructions

## 2020-11-30 ENCOUNTER — Telehealth: Payer: Self-pay

## 2020-11-30 NOTE — Telephone Encounter (Signed)
-----   Message from Richardo Priest, MD sent at 11/29/2020  5:01 PM EDT ----- Stable results he is still mildly anemic post bypass surgery

## 2020-11-30 NOTE — Telephone Encounter (Signed)
Left message on patients voicemail to please return our call.   

## 2020-12-01 ENCOUNTER — Telehealth: Payer: Self-pay

## 2020-12-01 NOTE — Telephone Encounter (Signed)
Left message on patients voicemail to please return our call.   

## 2020-12-01 NOTE — Telephone Encounter (Signed)
-----   Message from Richardo Priest, MD sent at 11/29/2020  5:01 PM EDT ----- Stable results he is still mildly anemic post bypass surgery

## 2020-12-02 ENCOUNTER — Telehealth: Payer: Self-pay

## 2020-12-02 NOTE — Telephone Encounter (Signed)
Left message on patients voicemail to please return our call.   Letter mailed to patient at this time.  

## 2020-12-02 NOTE — Telephone Encounter (Signed)
-----   Message from Richardo Priest, MD sent at 11/29/2020  5:01 PM EDT ----- Stable results he is still mildly anemic post bypass surgery

## 2020-12-28 NOTE — Progress Notes (Deleted)
Cardiology Office Note:    Date:  12/28/2020   ID:  Daniel Gibson, DOB [birth year], MRN [id number]  PCP:  Cyndi Bender, PA-C  Cardiologist:  Shirlee More, MD    Referring MD: Cyndi Bender, PA-C    ASSESSMENT:    No diagnosis found. PLAN:    In order of problems listed above:  ***   Next appointment: ***   Medication Adjustments/Labs and Tests Ordered: Current medicines are reviewed at length with the patient today.  Concerns regarding medicines are outlined above.  No orders of the defined types were placed in this encounter.  No orders of the defined types were placed in this encounter.   No chief complaint on file.   History of Present Illness:    Daniel Gibson is a 70 y.o. male with a hx of CAD with CABG 10/18/2020 hypertension hyperlipidemia diabetes and carotid stenosis 40 to 59% bilateral preoperative cerebrovascular duplex last seen 10/29/2020.  He was having GI symptoms in the setting of dual antiplatelet therapy he was placed on a PPI and aspirin was discontinued at his last visit. Compliance with diet, lifestyle and medications: *** Past Medical History:  Diagnosis Date   Carotid artery disease ([place]) 05/08/2012   CEA on left 2013   Coronary artery disease    Diabetes mellitus without complication ([place])    Food allergy    Alpha Gal allergy    Hyperlipidemia    Hypertensive heart disease without CHF 04/08/2018   Myocardial infarction ([place]) 1993   medically managed   Obesity (BMI 30-39.9) 04/08/2018   Prostate cancer ([place]) 08/07/2016   PVC's (premature ventricular contractions) 04/08/2018   Thrombocytopenia ([place])     Past Surgical History:  Procedure Laterality Date   APPENDECTOMY     CAROTID ENDARTERECTOMY Right 06-07-12   cea   CORONARY ARTERY BYPASS GRAFT N/A 10/18/2020   Procedure: CORONARY ARTERY BYPASS GRAFTING (CABG)x3. LEFT INTERNAL MAMMARY ARTERY. RIGHT ENDOSCOPIC SAPHENOUS VEIN HARVESTING.;  Surgeon: Wonda Olds, MD;   Location: [facility];  Service: Open Heart Surgery;  Laterality: N/A;   ENDARTERECTOMY  06/07/2012   Procedure: ENDARTERECTOMY CAROTID;  Surgeon: Angelia Mould, MD;  Location: [facility] OR;  Service: Vascular;  Laterality: Right;  Right Carotid Endarterectomy with Patch Angioplasty   LEFT HEART CATH AND CORONARY ANGIOGRAPHY N/A 10/07/2020   Procedure: LEFT HEART CATH AND CORONARY ANGIOGRAPHY;  Surgeon: Troy Sine, MD;  Location: [facility];  Service: Cardiovascular;  Laterality: N/A;   TEE WITHOUT CARDIOVERSION N/A 10/18/2020   Procedure: TRANSESOPHAGEAL ECHOCARDIOGRAM (TEE);  Surgeon: Wonda Olds, MD;  Location: [facility];  Service: Open Heart Surgery;  Laterality: N/A;   TRANSPERINEAL IMPLANT OF RADIATION SEEDS W/ ULTRASOUND      Current Medications: No outpatient medications have been marked as taking for the 12/29/20 encounter (Appointment) with Richardo Priest, MD.     Allergies:   Ace inhibitors, Albumin (human), Beef-derived products, Gelfoam-jmi sponge, Heparin, Pork-derived products, Sheep-derived products, and Tetanus toxoids   Social History   Socioeconomic History   Marital status: Married    Spouse name: Not on file   Number of children: Not on file   Years of education: Not on file   Highest education level: Not on file  Occupational History   Not on file  Tobacco Use   Smoking status: Never   Smokeless tobacco: Never  Vaping Use   Vaping Use: Never used  Substance and Sexual Activity   Alcohol use: No  Drug use: No   Sexual activity: Not on file  Other Topics Concern   Not on file  Social History Narrative   Not on file   Social Determinants of Health   Financial Resource Strain: Not on file  Food Insecurity: Not on file  Transportation Needs: Not on file  Physical Activity: Not on file  Stress: Not on file  Social Connections: Not on file     Family History: The patient's ***family history includes Cancer in his mother; Diabetes in his  brother and sister; Heart attack in his father; Heart disease in his brother, father, and sister; Hyperlipidemia in his brother, father, mother, and sister; Hypertension in his brother, father, mother, and sister. ROS:   Please see the history of present illness.    All other systems reviewed and are negative.  EKGs/Labs/Other Studies Reviewed:    The following studies were reviewed today:  EKG:  EKG ordered today and personally reviewed.  The ekg ordered today demonstrates ***  Recent Labs: 10/19/2020: Magnesium 2.4 11/29/2020: ALT 8; BUN 13; Creatinine, Ser 1.12; Hemoglobin 14.2; Platelets 201; Potassium 4.4; Sodium 136  Recent Lipid Panel    Component Value Date/Time   CHOL 117 11/29/2020 1049   TRIG 107 11/29/2020 1049   HDL 39 (L) 11/29/2020 1049   CHOLHDL 3.0 11/29/2020 1049   LDLCALC 58 11/29/2020 1049    Physical Exam:    VS:  There were no vitals taken for this visit.    Wt Readings from Last 3 Encounters:  11/29/20 183 lb (83 kg)  10/26/20 205 lb (93 kg)  10/22/20 207 lb 14.4 oz (94.3 kg)     GEN: *** Well nourished, well developed in no acute distress HEENT: Normal NECK: No JVD; No carotid bruits LYMPHATICS: No lymphadenopathy CARDIAC: ***RRR, no murmurs, rubs, gallops RESPIRATORY:  Clear to auscultation without rales, wheezing or rhonchi  ABDOMEN: Soft, non-tender, non-distended MUSCULOSKELETAL:  No edema; No deformity  SKIN: Warm and dry NEUROLOGIC:  Alert and oriented x 3 PSYCHIATRIC:  Normal affect    Signed, Shirlee More, MD  12/28/2020 10:23 AM    [place]

## 2020-12-29 ENCOUNTER — Ambulatory Visit: Payer: Medicare HMO | Admitting: Cardiology

## 2020-12-29 ENCOUNTER — Encounter: Payer: Self-pay | Admitting: Cardiology

## 2020-12-29 ENCOUNTER — Other Ambulatory Visit: Payer: Self-pay

## 2020-12-29 VITALS — BP 118/60 | HR 60 | Ht 70.0 in | Wt 187.0 lb

## 2020-12-29 DIAGNOSIS — I119 Hypertensive heart disease without heart failure: Secondary | ICD-10-CM

## 2020-12-29 DIAGNOSIS — E785 Hyperlipidemia, unspecified: Secondary | ICD-10-CM | POA: Diagnosis not present

## 2020-12-29 DIAGNOSIS — I25119 Atherosclerotic heart disease of native coronary artery with unspecified angina pectoris: Secondary | ICD-10-CM | POA: Diagnosis not present

## 2020-12-29 NOTE — Progress Notes (Signed)
Cardiology Office Note:    Date:  12/29/2020   ID:  ARMAS MCBEE, DOB December 06, 1950, MRN [id number]  PCP:  Cyndi Bender, PA-C  Cardiologist:  Shirlee More, MD    Referring MD: Cyndi Bender, PA-C    ASSESSMENT:    1. Coronary artery disease involving native heart with angina pectoris, unspecified vessel or lesion type ([place])   2. Hypertensive heart disease without CHF   3. Hyperlipidemia, unspecified hyperlipidemia type    PLAN:    In order of problems listed above:  He is improved since his last visit GI symptoms were due to aspirin organ to keep him on single antiplatelet clopidogrel and continue PPI until his next visit.  He is having no angina New York Heart Association class I following elective CABG. Stable blood pressure at target on a multidrug regimen including calcium channel blocker and beta-blocker. Lipids at target continue his high intensity statin and Zetia Immediate follow-up carotid duplex 1 year   Next appointment: 3 months   Medication Adjustments/Labs and Tests Ordered: Current medicines are reviewed at length with the patient today.  Concerns regarding medicines are outlined above.  No orders of the defined types were placed in this encounter.  No orders of the defined types were placed in this encounter.   Chief Complaint  Patient presents with   Follow-up   Coronary Artery Disease    With bypass surgery CABG 10/18/2020    History of Present Illness:    Daniel Gibson is a 70 y.o. male with a hx of CAD CABG 10/18/2020 bilateral carotid stenosis 40 to 59% hypertension hyperlipidemia and frequent PVCs last seen 11/29/2020 with predominant GI symptoms in the setting of dual antiplatelet therapy and aspirin was discontinued and was placed on a PPI.[name]  Compliance with diet, lifestyle and medications: Yes  His GI symptoms have cleared off aspirin on PPI appetite is returned strength and endurance is better and is returned to work. With hot humid  weather he is not exercising and I told him to go ahead and start cardiac rehab [facility] is the closest and they have already reached out to them. He is having no edema shortness of breath chest pain palpitation or syncope. He tolerates his statin without muscle pain or weakness  Carotid duplex prior to CABG showed right ICA stenosis 40 to 59% left ICA stenosis 40 to 59% and high resistance flow antegrade in the right vertebral artery.  Summary:  Right Carotid: Velocities in the right ICA are consistent with a 40-59%                 stenosis.   Left Carotid: Velocities in the left ICA are consistent with a 40-59%  stenosis.  Vertebrals:  Right vertebral artery demonstrates antegrade flow. Left  vertebral               artery demonstrates high resistant flow.  Subclavians: Normal flow hemodynamics were seen in bilateral subclavian               arteries.  His echocardiogram showed EF 55 to 60%.   1. Left ventricular ejection fraction, by estimation, is 55 to 60%. The  left ventricle has normal function. The left ventricle has no regional  wall motion abnormalities. Left ventricular diastolic parameters were  normal.   2. Right ventricular systolic function is normal. The right ventricular  size is normal. There is normal pulmonary artery systolic pressure.   3. The mitral valve is normal in  structure. Trivial mitral valve  regurgitation.   4. The aortic valve is normal in structure. Aortic valve regurgitation is  not visualized.   Past Medical History:  Diagnosis Date   Carotid artery disease ([place]) 05/08/2012   CEA on left 2013   Coronary artery disease    Diabetes mellitus without complication ([place])    Food allergy    Alpha Gal allergy    Hyperlipidemia    Hypertensive heart disease without CHF 04/08/2018   Myocardial infarction ([place]) 1993   medically managed   Obesity (BMI 30-39.9) 04/08/2018   Prostate cancer ([place]) 08/07/2016   PVC's (premature ventricular contractions)  04/08/2018   Thrombocytopenia ([place])     Past Surgical History:  Procedure Laterality Date   APPENDECTOMY     CAROTID ENDARTERECTOMY Right 06-07-12   cea   CORONARY ARTERY BYPASS GRAFT N/A 10/18/2020   Procedure: CORONARY ARTERY BYPASS GRAFTING (CABG)x3. LEFT INTERNAL MAMMARY ARTERY. RIGHT ENDOSCOPIC SAPHENOUS VEIN HARVESTING.;  Surgeon: Wonda Olds, MD;  Location: [place];  Service: Open Heart Surgery;  Laterality: N/A;   ENDARTERECTOMY  06/07/2012   Procedure: ENDARTERECTOMY CAROTID;  Surgeon: Angelia Mould, MD;  Location: [facility] OR;  Service: Vascular;  Laterality: Right;  Right Carotid Endarterectomy with Patch Angioplasty   LEFT HEART CATH AND CORONARY ANGIOGRAPHY N/A 10/07/2020   Procedure: LEFT HEART CATH AND CORONARY ANGIOGRAPHY;  Surgeon: Troy Sine, MD;  Location: [place] CV LAB;  Service: Cardiovascular;  Laterality: N/A;   TEE WITHOUT CARDIOVERSION N/A 10/18/2020   Procedure: TRANSESOPHAGEAL ECHOCARDIOGRAM (TEE);  Surgeon: Wonda Olds, MD;  Location: [place];  Service: Open Heart Surgery;  Laterality: N/A;   TRANSPERINEAL IMPLANT OF RADIATION SEEDS W/ ULTRASOUND      Current Medications: Current Meds  Medication Sig   acetaminophen (TYLENOL) 500 MG tablet Take 500-1,000 mg by mouth every 6 (six) hours as needed (pain).   amLODipine (NORVASC) 10 MG tablet Take 10 mg by mouth in the morning.   carvedilol (COREG) 25 MG tablet Take 12.5 mg by mouth 2 (two) times daily with a meal.   clopidogrel (PLAVIX) 75 MG tablet Take 1 tablet (75 mg total) by mouth daily.   EPINEPHrine (EPIPEN 2-PAK) 0.3 mg/0.3 mL IJ SOAJ injection Use as directed for life-threatening allergic reaction.   ezetimibe (ZETIA) 10 MG tablet Take 1 tablet (10 mg total) by mouth daily.   fluticasone (FLONASE) 50 MCG/ACT nasal spray Place 2 sprays into both nostrils daily as needed for allergies.   metFORMIN (GLUCOPHAGE) 1000 MG tablet Take 500 mg by mouth in the morning and at bedtime.    pantoprazole (PROTONIX) 40 MG tablet Take 1 tablet (40 mg total) by mouth daily.   PREVIDENT 5000 DRY MOUTH 1.1 % GEL dental gel Place 1 application onto teeth 2 (two) times daily.   rosuvastatin (CRESTOR) 40 MG tablet Take 1 tablet (40 mg total) by mouth daily.   Tamsulosin HCl (FLOMAX) 0.4 MG CAPS Take 0.8 mg by mouth at bedtime.   traMADol (ULTRAM) 50 MG tablet Take 1 tablet (50 mg total) by mouth every 6 (six) hours as needed for moderate pain.   Trospium Chloride 60 MG CP24 Take 1 capsule (60 mg total) by mouth daily.     Allergies:   Ace inhibitors, Albumin (human), Beef-derived products, Gelfoam-jmi sponge, Heparin, Pork-derived products, Sheep-derived products, and Tetanus toxoids   Social History   Socioeconomic History   Marital status: Married    Spouse name: Not on file   Number  of children: Not on file   Years of education: Not on file   Highest education level: Not on file  Occupational History   Not on file  Tobacco Use   Smoking status: Never   Smokeless tobacco: Never  Vaping Use   Vaping Use: Never used  Substance and Sexual Activity   Alcohol use: No   Drug use: No   Sexual activity: Not on file  Other Topics Concern   Not on file  Social History Narrative   Not on file   Social Determinants of Health   Financial Resource Strain: Not on file  Food Insecurity: Not on file  Transportation Needs: Not on file  Physical Activity: Not on file  Stress: Not on file  Social Connections: Not on file     Family History: The patient's family history includes Cancer in his mother; Diabetes in his brother and sister; Heart attack in his father; Heart disease in his brother, father, and sister; Hyperlipidemia in his brother, father, mother, and sister; Hypertension in his brother, father, mother, and sister. ROS:   Please see the history of present illness.    All other systems reviewed and are negative.  EKGs/Labs/Other Studies Reviewed:    The following  studies were reviewed today:   Recent Labs: 10/19/2020: Magnesium 2.4 11/29/2020: ALT 8; BUN 13; Creatinine, Ser 1.12; Hemoglobin 14.2; Platelets 201; Potassium 4.4; Sodium 136  Recent Lipid Panel    Component Value Date/Time   CHOL 117 11/29/2020 1049   TRIG 107 11/29/2020 1049   HDL 39 (L) 11/29/2020 1049   CHOLHDL 3.0 11/29/2020 1049   LDLCALC 58 11/29/2020 1049    Physical Exam:    VS:  BP 118/60 (BP Location: Left Arm, Patient Position: Sitting, Cuff Size: Normal)   Pulse 60   Ht 5\' 10"  (1.778 m)   Wt 187 lb (84.8 kg)   SpO2 99%   BMI 26.83 kg/m     Wt Readings from Last 3 Encounters:  12/29/20 187 lb (84.8 kg)  11/29/20 183 lb (83 kg)  10/26/20 205 lb (93 kg)     GEN: He looks markedly improved no longer appears weak well nourished, well developed in no acute distress HEENT: Normal NECK: No JVD; No carotid bruits LYMPHATICS: No lymphadenopathy CARDIAC: RRR, no murmurs, rubs, gallops RESPIRATORY:  Clear to auscultation without rales, wheezing or rhonchi  ABDOMEN: Soft, non-tender, non-distended MUSCULOSKELETAL:  No edema; No deformity  SKIN: Warm and dry NEUROLOGIC:  Alert and oriented x 3 PSYCHIATRIC:  Normal affect    Signed, Shirlee More, MD  12/29/2020 8:06 AM    [place]

## 2020-12-29 NOTE — Patient Instructions (Signed)
Medication Instructions:  Your physician recommends that you continue on your current medications as directed. Please refer to the Current Medication list given to you today.  *If you need a refill on your cardiac medications before your next appointment, please call your pharmacy*   Lab Work: None If you have labs (blood work) drawn today and your tests are completely normal, you will receive your results only by: [place] (if you have MyChart) OR A paper copy in the mail If you have any lab test that is abnormal or we need to change your treatment, we will call you to review the results.   Testing/Procedures: None   Follow-Up: At [facility], you and your health needs are our priority.  As part of our continuing mission to provide you with exceptional heart care, we have created designated Provider Care Teams.  These Care Teams include your primary Cardiologist (physician) and Advanced Practice Providers (APPs -  Physician Assistants and Nurse Practitioners) who all work together to provide you with the care you need, when you need it.  We recommend signing up for the patient portal called "MyChart".  Sign up information is provided on this After Visit Summary.  MyChart is used to connect with patients for Virtual Visits (Telemedicine).  Patients are able to view lab/test results, encounter notes, upcoming appointments, etc.  Non-urgent messages can be sent to your provider as well.   To learn more about what you can do with MyChart, go to [email or web address].    Your next appointment:   3 month(s)  The format for your next appointment:   In Person  Provider:   Shirlee More, MD   Other Instructions Please start cardiac rehab at [facility]   Please get your 2nd COVID booster  Continue taking your protonix.

## 2021-01-25 DIAGNOSIS — I10 Essential (primary) hypertension: Secondary | ICD-10-CM | POA: Diagnosis not present

## 2021-01-25 DIAGNOSIS — T783XXA Angioneurotic edema, initial encounter: Secondary | ICD-10-CM | POA: Diagnosis not present

## 2021-01-25 DIAGNOSIS — E1129 Type 2 diabetes mellitus with other diabetic kidney complication: Secondary | ICD-10-CM | POA: Diagnosis not present

## 2021-01-25 DIAGNOSIS — I2510 Atherosclerotic heart disease of native coronary artery without angina pectoris: Secondary | ICD-10-CM | POA: Diagnosis not present

## 2021-01-27 DIAGNOSIS — I6529 Occlusion and stenosis of unspecified carotid artery: Secondary | ICD-10-CM | POA: Diagnosis not present

## 2021-01-27 DIAGNOSIS — E782 Mixed hyperlipidemia: Secondary | ICD-10-CM | POA: Diagnosis not present

## 2021-01-27 DIAGNOSIS — I2510 Atherosclerotic heart disease of native coronary artery without angina pectoris: Secondary | ICD-10-CM | POA: Diagnosis not present

## 2021-01-27 DIAGNOSIS — E1129 Type 2 diabetes mellitus with other diabetic kidney complication: Secondary | ICD-10-CM | POA: Diagnosis not present

## 2021-01-27 DIAGNOSIS — I10 Essential (primary) hypertension: Secondary | ICD-10-CM | POA: Diagnosis not present

## 2021-01-27 DIAGNOSIS — J309 Allergic rhinitis, unspecified: Secondary | ICD-10-CM | POA: Diagnosis not present

## 2021-01-27 DIAGNOSIS — Z6829 Body mass index (BMI) 29.0-29.9, adult: Secondary | ICD-10-CM | POA: Diagnosis not present

## 2021-01-27 DIAGNOSIS — G4700 Insomnia, unspecified: Secondary | ICD-10-CM | POA: Diagnosis not present

## 2021-04-01 DIAGNOSIS — L814 Other melanin hyperpigmentation: Secondary | ICD-10-CM | POA: Diagnosis not present

## 2021-04-01 DIAGNOSIS — D692 Other nonthrombocytopenic purpura: Secondary | ICD-10-CM | POA: Diagnosis not present

## 2021-04-01 DIAGNOSIS — L821 Other seborrheic keratosis: Secondary | ICD-10-CM | POA: Diagnosis not present

## 2021-04-01 DIAGNOSIS — D225 Melanocytic nevi of trunk: Secondary | ICD-10-CM | POA: Diagnosis not present

## 2021-04-01 DIAGNOSIS — D485 Neoplasm of uncertain behavior of skin: Secondary | ICD-10-CM | POA: Diagnosis not present

## 2021-04-01 DIAGNOSIS — L900 Lichen sclerosus et atrophicus: Secondary | ICD-10-CM | POA: Diagnosis not present

## 2021-04-01 DIAGNOSIS — D2272 Melanocytic nevi of left lower limb, including hip: Secondary | ICD-10-CM | POA: Diagnosis not present

## 2021-04-01 DIAGNOSIS — D2261 Melanocytic nevi of right upper limb, including shoulder: Secondary | ICD-10-CM | POA: Diagnosis not present

## 2021-04-01 DIAGNOSIS — D2271 Melanocytic nevi of right lower limb, including hip: Secondary | ICD-10-CM | POA: Diagnosis not present

## 2021-04-01 DIAGNOSIS — D2371 Other benign neoplasm of skin of right lower limb, including hip: Secondary | ICD-10-CM | POA: Diagnosis not present

## 2021-04-03 NOTE — Progress Notes (Signed)
Cardiology Office Note:    Date:  04/04/2021   ID:  DARRNELL MANGIARACINA, DOB 1951-05-18, MRN [id number]  PCP:  Cyndi Bender, PA-C  Cardiologist:  Shirlee More, MD    Referring MD: Cyndi Bender, PA-C    ASSESSMENT:    1. Coronary artery disease involving native heart with angina pectoris, unspecified vessel or lesion type ([place])   2. PVC's (premature ventricular contractions)   3. Hypertensive heart disease without CHF   4. Hyperlipidemia, unspecified hyperlipidemia type    PLAN:    In order of problems listed above:  Is recovered from his previous GI symptoms resolved stopping aspirin and adding PPI continue current medical treatment Improved continue beta-blocker Stressed the need for compliance of medications to check blood pressure daily for 2 weeks and send her to the chart continue current treatment including calcium channel blocker and beta-blocker Lipids at target continue his high intensity statin   Next appointment: 6 months   Medication Adjustments/Labs and Tests Ordered: Current medicines are reviewed at length with the patient today.  Concerns regarding medicines are outlined above.  No orders of the defined types were placed in this encounter.  No orders of the defined types were placed in this encounter.   Chief Complaint  Patient presents with   Follow-up   Coronary Artery Disease     History of Present Illness:    CHUN SELLEN is a 70 y.o. male with a hx of coronary artery disease with CABG 10/18/2020 mild bilateral carotid stenosis 40 to 59% hypertension hyperlipidemia frequent PVCs last seen 12/29/2020.[name] Compliance with diet, lifestyle and medications: Yes  He is doing more demands at work and home caring for his wife He admits to missing his medications frequently all\blood pressures when checked have been in the range of 347Q to 259D systolic No angina edema shortness of breath palpitation or syncope. Past Medical History:  Diagnosis  Date   Carotid artery disease ([place]) 05/08/2012   CEA on left 2013   Coronary artery disease    Diabetes mellitus without complication ([place])    Food allergy    Alpha Gal allergy    Hyperlipidemia    Hypertensive heart disease without CHF 04/08/2018   Myocardial infarction ([place]) 1993   medically managed   Obesity (BMI 30-39.9) 04/08/2018   Prostate cancer ([place]) 08/07/2016   PVC's (premature ventricular contractions) 04/08/2018   Thrombocytopenia ([place])     Past Surgical History:  Procedure Laterality Date   APPENDECTOMY     CAROTID ENDARTERECTOMY Right 06-07-12   cea   CORONARY ARTERY BYPASS GRAFT N/A 10/18/2020   Procedure: CORONARY ARTERY BYPASS GRAFTING (CABG)x3. LEFT INTERNAL MAMMARY ARTERY. RIGHT ENDOSCOPIC SAPHENOUS VEIN HARVESTING.;  Surgeon: Wonda Olds, MD;  Location: [place];  Service: Open Heart Surgery;  Laterality: N/A;   ENDARTERECTOMY  06/07/2012   Procedure: ENDARTERECTOMY CAROTID;  Surgeon: Angelia Mould, MD;  Location: [facility] OR;  Service: Vascular;  Laterality: Right;  Right Carotid Endarterectomy with Patch Angioplasty   LEFT HEART CATH AND CORONARY ANGIOGRAPHY N/A 10/07/2020   Procedure: LEFT HEART CATH AND CORONARY ANGIOGRAPHY;  Surgeon: Troy Sine, MD;  Location: [place] CV LAB;  Service: Cardiovascular;  Laterality: N/A;   TEE WITHOUT CARDIOVERSION N/A 10/18/2020   Procedure: TRANSESOPHAGEAL ECHOCARDIOGRAM (TEE);  Surgeon: Wonda Olds, MD;  Location: [place];  Service: Open Heart Surgery;  Laterality: N/A;   TRANSPERINEAL IMPLANT OF RADIATION SEEDS W/ ULTRASOUND      Current Medications: Current Meds  Medication  Sig   acetaminophen (TYLENOL) 500 MG tablet Take 500-1,000 mg by mouth every 6 (six) hours as needed (pain).   amLODipine (NORVASC) 10 MG tablet Take 10 mg by mouth in the morning.   carvedilol (COREG) 25 MG tablet Take 12.5 mg by mouth 2 (two) times daily with a meal.   clopidogrel (PLAVIX) 75 MG tablet Take 1 tablet (75 mg total)  by mouth daily.   EPINEPHrine (EPIPEN 2-PAK) 0.3 mg/0.3 mL IJ SOAJ injection Use as directed for life-threatening allergic reaction.   ezetimibe (ZETIA) 10 MG tablet Take 1 tablet (10 mg total) by mouth daily.   fluticasone (FLONASE) 50 MCG/ACT nasal spray Place 2 sprays into both nostrils daily as needed for allergies.   metFORMIN (GLUCOPHAGE) 1000 MG tablet Take 500 mg by mouth in the morning and at bedtime.   pantoprazole (PROTONIX) 40 MG tablet Take 1 tablet (40 mg total) by mouth daily.   PREVIDENT 5000 DRY MOUTH 1.1 % GEL dental gel Place 1 application onto teeth 2 (two) times daily.   rosuvastatin (CRESTOR) 40 MG tablet Take 1 tablet (40 mg total) by mouth daily.   Tamsulosin HCl (FLOMAX) 0.4 MG CAPS Take 0.8 mg by mouth at bedtime.   traMADol (ULTRAM) 50 MG tablet Take 1 tablet (50 mg total) by mouth every 6 (six) hours as needed for moderate pain.   Trospium Chloride 60 MG CP24 Take 1 capsule (60 mg total) by mouth daily.     Allergies:   Ace inhibitors, Albumin (human), Beef-derived products, Gelfoam-jmi sponge, Heparin, Pork-derived products, Sheep-derived products, and Tetanus toxoids   Social History   Socioeconomic History   Marital status: Married    Spouse name: Not on file   Number of children: Not on file   Years of education: Not on file   Highest education level: Not on file  Occupational History   Not on file  Tobacco Use   Smoking status: Never   Smokeless tobacco: Never  Vaping Use   Vaping Use: Never used  Substance and Sexual Activity   Alcohol use: No   Drug use: No   Sexual activity: Not on file  Other Topics Concern   Not on file  Social History Narrative   Not on file   Social Determinants of Health   Financial Resource Strain: Not on file  Food Insecurity: Not on file  Transportation Needs: Not on file  Physical Activity: Not on file  Stress: Not on file  Social Connections: Not on file     Family History: The patient's family history  includes Cancer in his mother; Diabetes in his brother and sister; Heart attack in his father; Heart disease in his brother, father, and sister; Hyperlipidemia in his brother, father, mother, and sister; Hypertension in his brother, father, mother, and sister. ROS:   Please see the history of present illness.    All other systems reviewed and are negative.  EKGs/Labs/Other Studies Reviewed:    The following studies were reviewed today:  Recent labs PCP 01/25/2021 cholesterol 108 LDL 52 triglyceride 88 HDL 39 A1c 6.7% hemoglobin 13.4 creatinine 1.17 potassium 4.7 all good  Recent Labs: 10/19/2020: Magnesium 2.4 11/29/2020: ALT 8; BUN 13; Creatinine, Ser 1.12; Hemoglobin 14.2; Platelets 201; Potassium 4.4; Sodium 136  Recent Lipid Panel    Component Value Date/Time   CHOL 117 11/29/2020 1049   TRIG 107 11/29/2020 1049   HDL 39 (L) 11/29/2020 1049   CHOLHDL 3.0 11/29/2020 1049   LDLCALC 58 11/29/2020 1049  Physical Exam:    VS:  BP (!) 180/90 (BP Location: Left Arm, Patient Position: Sitting)   Pulse 88   Ht 5\' 10"  (1.778 m)   Wt 195 lb (88.5 kg)   SpO2 97%   BMI 27.98 kg/m     Wt Readings from Last 3 Encounters:  04/04/21 195 lb (88.5 kg)  12/29/20 187 lb (84.8 kg)  11/29/20 183 lb (83 kg)     GEN:  Well nourished, well developed in no acute distress HEENT: Normal NECK: No JVD; No carotid bruits LYMPHATICS: No lymphadenopathy CARDIAC: RRR, no murmurs, rubs, gallops RESPIRATORY:  Clear to auscultation without rales, wheezing or rhonchi  ABDOMEN: Soft, non-tender, non-distended MUSCULOSKELETAL:  No edema; No deformity  SKIN: Warm and dry NEUROLOGIC:  Alert and oriented x 3 PSYCHIATRIC:  Normal affect    Signed, Shirlee More, MD  04/04/2021 8:48 AM    [facility]

## 2021-04-04 ENCOUNTER — Encounter: Payer: Self-pay | Admitting: Cardiology

## 2021-04-04 ENCOUNTER — Ambulatory Visit: Payer: Medicare HMO | Admitting: Cardiology

## 2021-04-04 ENCOUNTER — Other Ambulatory Visit: Payer: Self-pay

## 2021-04-04 VITALS — BP 158/80 | HR 88 | Ht 70.0 in | Wt 195.0 lb

## 2021-04-04 DIAGNOSIS — I493 Ventricular premature depolarization: Secondary | ICD-10-CM | POA: Diagnosis not present

## 2021-04-04 DIAGNOSIS — E785 Hyperlipidemia, unspecified: Secondary | ICD-10-CM | POA: Diagnosis not present

## 2021-04-04 DIAGNOSIS — I25119 Atherosclerotic heart disease of native coronary artery with unspecified angina pectoris: Secondary | ICD-10-CM | POA: Diagnosis not present

## 2021-04-04 DIAGNOSIS — I119 Hypertensive heart disease without heart failure: Secondary | ICD-10-CM

## 2021-04-04 NOTE — Patient Instructions (Signed)
Medication Instructions:  Your physician recommends that you continue on your current medications as directed. Please refer to the Current Medication list given to you today.  *If you need a refill on your cardiac medications before your next appointment, please call your pharmacy*   Lab Work: None If you have labs (blood work) drawn today and your tests are completely normal, you will receive your results only by: [place] (if you have MyChart) OR A paper copy in the mail If you have any lab test that is abnormal or we need to change your treatment, we will call you to review the results.   Testing/Procedures: None   Follow-Up: At [facility], you and your health needs are our priority.  As part of our continuing mission to provide you with exceptional heart care, we have created designated Provider Care Teams.  These Care Teams include your primary Cardiologist (physician) and Advanced Practice Providers (APPs -  Physician Assistants and Nurse Practitioners) who all work together to provide you with the care you need, when you need it.  We recommend signing up for the patient portal called "MyChart".  Sign up information is provided on this After Visit Summary.  MyChart is used to connect with patients for Virtual Visits (Telemedicine).  Patients are able to view lab/test results, encounter notes, upcoming appointments, etc.  Non-urgent messages can be sent to your provider as well.   To learn more about what you can do with MyChart, go to [email or web address].    Your next appointment:   6 month(s)  The format for your next appointment:   In Person  Provider:   Shirlee More, MD   Other Instructions Please take your blood pressure daily for the next two weeks and send [place] a message on MyChart with the results.

## 2021-05-03 DIAGNOSIS — D485 Neoplasm of uncertain behavior of skin: Secondary | ICD-10-CM | POA: Diagnosis not present

## 2021-05-03 DIAGNOSIS — L988 Other specified disorders of the skin and subcutaneous tissue: Secondary | ICD-10-CM | POA: Diagnosis not present

## 2021-08-01 DIAGNOSIS — I10 Essential (primary) hypertension: Secondary | ICD-10-CM | POA: Diagnosis not present

## 2021-08-01 DIAGNOSIS — E1129 Type 2 diabetes mellitus with other diabetic kidney complication: Secondary | ICD-10-CM | POA: Diagnosis not present

## 2021-08-01 DIAGNOSIS — T783XXA Angioneurotic edema, initial encounter: Secondary | ICD-10-CM | POA: Diagnosis not present

## 2021-08-01 DIAGNOSIS — I2510 Atherosclerotic heart disease of native coronary artery without angina pectoris: Secondary | ICD-10-CM | POA: Diagnosis not present

## 2021-09-01 DIAGNOSIS — I6529 Occlusion and stenosis of unspecified carotid artery: Secondary | ICD-10-CM | POA: Diagnosis not present

## 2021-09-01 DIAGNOSIS — G4700 Insomnia, unspecified: Secondary | ICD-10-CM | POA: Diagnosis not present

## 2021-09-01 DIAGNOSIS — Z6829 Body mass index (BMI) 29.0-29.9, adult: Secondary | ICD-10-CM | POA: Diagnosis not present

## 2021-09-01 DIAGNOSIS — E1129 Type 2 diabetes mellitus with other diabetic kidney complication: Secondary | ICD-10-CM | POA: Diagnosis not present

## 2021-09-01 DIAGNOSIS — I10 Essential (primary) hypertension: Secondary | ICD-10-CM | POA: Diagnosis not present

## 2021-09-01 DIAGNOSIS — J309 Allergic rhinitis, unspecified: Secondary | ICD-10-CM | POA: Diagnosis not present

## 2021-09-01 DIAGNOSIS — E782 Mixed hyperlipidemia: Secondary | ICD-10-CM | POA: Diagnosis not present

## 2021-09-01 DIAGNOSIS — I2510 Atherosclerotic heart disease of native coronary artery without angina pectoris: Secondary | ICD-10-CM | POA: Diagnosis not present

## 2021-09-01 DIAGNOSIS — T783XXA Angioneurotic edema, initial encounter: Secondary | ICD-10-CM | POA: Diagnosis not present

## 2021-09-25 ENCOUNTER — Other Ambulatory Visit: Payer: Self-pay | Admitting: Cardiology

## 2021-09-26 NOTE — Telephone Encounter (Signed)
Rosuvastatin 40 mg # 90 x 1 [place] to  [id number] [place]

## 2021-10-06 DIAGNOSIS — Z1331 Encounter for screening for depression: Secondary | ICD-10-CM | POA: Diagnosis not present

## 2021-10-06 DIAGNOSIS — Z0000 Encounter for general adult medical examination without abnormal findings: Secondary | ICD-10-CM | POA: Diagnosis not present

## 2021-10-06 DIAGNOSIS — E785 Hyperlipidemia, unspecified: Secondary | ICD-10-CM | POA: Diagnosis not present

## 2021-10-06 DIAGNOSIS — Z9181 History of falling: Secondary | ICD-10-CM | POA: Diagnosis not present

## 2021-11-14 ENCOUNTER — Other Ambulatory Visit: Payer: Self-pay | Admitting: Cardiology

## 2021-11-14 DIAGNOSIS — E785 Hyperlipidemia, unspecified: Secondary | ICD-10-CM

## 2021-11-14 DIAGNOSIS — I119 Hypertensive heart disease without heart failure: Secondary | ICD-10-CM

## 2021-11-14 DIAGNOSIS — I25119 Atherosclerotic heart disease of native coronary artery with unspecified angina pectoris: Secondary | ICD-10-CM

## 2021-11-14 DIAGNOSIS — I6523 Occlusion and stenosis of bilateral carotid arteries: Secondary | ICD-10-CM

## 2021-11-14 DIAGNOSIS — E118 Type 2 diabetes mellitus with unspecified complications: Secondary | ICD-10-CM

## 2021-11-14 DIAGNOSIS — I493 Ventricular premature depolarization: Secondary | ICD-10-CM

## 2021-11-22 ENCOUNTER — Other Ambulatory Visit: Payer: Self-pay | Admitting: Cardiology

## 2021-12-11 DIAGNOSIS — N3001 Acute cystitis with hematuria: Secondary | ICD-10-CM | POA: Diagnosis not present

## 2021-12-11 DIAGNOSIS — I2510 Atherosclerotic heart disease of native coronary artery without angina pectoris: Secondary | ICD-10-CM | POA: Diagnosis not present

## 2021-12-11 DIAGNOSIS — Z6825 Body mass index (BMI) 25.0-25.9, adult: Secondary | ICD-10-CM | POA: Diagnosis not present

## 2021-12-11 DIAGNOSIS — R3915 Urgency of urination: Secondary | ICD-10-CM | POA: Diagnosis not present

## 2021-12-11 DIAGNOSIS — I10 Essential (primary) hypertension: Secondary | ICD-10-CM | POA: Diagnosis not present

## 2021-12-11 DIAGNOSIS — N200 Calculus of kidney: Secondary | ICD-10-CM | POA: Diagnosis not present

## 2021-12-11 DIAGNOSIS — R350 Frequency of micturition: Secondary | ICD-10-CM | POA: Diagnosis not present

## 2021-12-11 DIAGNOSIS — D72829 Elevated white blood cell count, unspecified: Secondary | ICD-10-CM | POA: Diagnosis not present

## 2021-12-11 DIAGNOSIS — R935 Abnormal findings on diagnostic imaging of other abdominal regions, including retroperitoneum: Secondary | ICD-10-CM | POA: Diagnosis not present

## 2021-12-11 DIAGNOSIS — Z7902 Long term (current) use of antithrombotics/antiplatelets: Secondary | ICD-10-CM | POA: Diagnosis not present

## 2021-12-11 DIAGNOSIS — N2889 Other specified disorders of kidney and ureter: Secondary | ICD-10-CM | POA: Diagnosis not present

## 2021-12-11 DIAGNOSIS — R319 Hematuria, unspecified: Secondary | ICD-10-CM | POA: Diagnosis not present

## 2021-12-11 DIAGNOSIS — R93421 Abnormal radiologic findings on diagnostic imaging of right kidney: Secondary | ICD-10-CM | POA: Diagnosis not present

## 2021-12-11 DIAGNOSIS — R5383 Other fatigue: Secondary | ICD-10-CM | POA: Diagnosis not present

## 2021-12-11 DIAGNOSIS — Z951 Presence of aortocoronary bypass graft: Secondary | ICD-10-CM | POA: Diagnosis not present

## 2021-12-11 DIAGNOSIS — E119 Type 2 diabetes mellitus without complications: Secondary | ICD-10-CM | POA: Diagnosis not present

## 2021-12-14 DIAGNOSIS — N2889 Other specified disorders of kidney and ureter: Secondary | ICD-10-CM | POA: Diagnosis not present

## 2021-12-14 DIAGNOSIS — N390 Urinary tract infection, site not specified: Secondary | ICD-10-CM | POA: Diagnosis not present

## 2021-12-14 DIAGNOSIS — Z8546 Personal history of malignant neoplasm of prostate: Secondary | ICD-10-CM | POA: Diagnosis not present

## 2021-12-14 DIAGNOSIS — Z139 Encounter for screening, unspecified: Secondary | ICD-10-CM | POA: Diagnosis not present

## 2021-12-14 DIAGNOSIS — D484 Neoplasm of uncertain behavior of peritoneum: Secondary | ICD-10-CM | POA: Diagnosis not present

## 2021-12-14 DIAGNOSIS — B9620 Unspecified Escherichia coli [E. coli] as the cause of diseases classified elsewhere: Secondary | ICD-10-CM | POA: Diagnosis not present

## 2021-12-26 ENCOUNTER — Telehealth: Payer: Self-pay | Admitting: Physician Assistant

## 2021-12-26 NOTE — Telephone Encounter (Signed)
Scheduled appt per 6/30 referral. Pt is aware of appt date and time. Pt is aware to arrive 15 mins prior to appt time and to bring and updated insurance card. Pt is aware of appt location.   

## 2021-12-27 DIAGNOSIS — Z923 Personal history of irradiation: Secondary | ICD-10-CM | POA: Diagnosis not present

## 2021-12-27 DIAGNOSIS — N3289 Other specified disorders of bladder: Secondary | ICD-10-CM | POA: Diagnosis not present

## 2021-12-27 DIAGNOSIS — R531 Weakness: Secondary | ICD-10-CM | POA: Diagnosis not present

## 2021-12-27 DIAGNOSIS — Z6825 Body mass index (BMI) 25.0-25.9, adult: Secondary | ICD-10-CM | POA: Diagnosis not present

## 2021-12-27 DIAGNOSIS — I2510 Atherosclerotic heart disease of native coronary artery without angina pectoris: Secondary | ICD-10-CM | POA: Diagnosis not present

## 2021-12-27 DIAGNOSIS — Z888 Allergy status to other drugs, medicaments and biological substances status: Secondary | ICD-10-CM | POA: Diagnosis not present

## 2021-12-27 DIAGNOSIS — R300 Dysuria: Secondary | ICD-10-CM | POA: Diagnosis not present

## 2021-12-27 DIAGNOSIS — N2889 Other specified disorders of kidney and ureter: Secondary | ICD-10-CM | POA: Diagnosis not present

## 2021-12-27 DIAGNOSIS — R1013 Epigastric pain: Secondary | ICD-10-CM | POA: Diagnosis not present

## 2021-12-27 DIAGNOSIS — Z7984 Long term (current) use of oral hypoglycemic drugs: Secondary | ICD-10-CM | POA: Diagnosis not present

## 2021-12-27 DIAGNOSIS — R630 Anorexia: Secondary | ICD-10-CM | POA: Diagnosis not present

## 2021-12-27 DIAGNOSIS — R319 Hematuria, unspecified: Secondary | ICD-10-CM | POA: Diagnosis not present

## 2021-12-27 DIAGNOSIS — N12 Tubulo-interstitial nephritis, not specified as acute or chronic: Secondary | ICD-10-CM | POA: Insufficient documentation

## 2021-12-27 DIAGNOSIS — N390 Urinary tract infection, site not specified: Secondary | ICD-10-CM | POA: Diagnosis not present

## 2021-12-27 DIAGNOSIS — C61 Malignant neoplasm of prostate: Secondary | ICD-10-CM | POA: Diagnosis not present

## 2021-12-27 DIAGNOSIS — N200 Calculus of kidney: Secondary | ICD-10-CM | POA: Insufficient documentation

## 2021-12-27 DIAGNOSIS — Z7902 Long term (current) use of antithrombotics/antiplatelets: Secondary | ICD-10-CM | POA: Diagnosis not present

## 2021-12-27 DIAGNOSIS — R109 Unspecified abdominal pain: Secondary | ICD-10-CM | POA: Diagnosis not present

## 2021-12-27 DIAGNOSIS — Z951 Presence of aortocoronary bypass graft: Secondary | ICD-10-CM | POA: Diagnosis not present

## 2021-12-27 DIAGNOSIS — Z79899 Other long term (current) drug therapy: Secondary | ICD-10-CM | POA: Diagnosis not present

## 2021-12-27 DIAGNOSIS — B9620 Unspecified Escherichia coli [E. coli] as the cause of diseases classified elsewhere: Secondary | ICD-10-CM | POA: Diagnosis not present

## 2021-12-27 DIAGNOSIS — Z823 Family history of stroke: Secondary | ICD-10-CM | POA: Diagnosis not present

## 2021-12-27 DIAGNOSIS — Z91018 Allergy to other foods: Secondary | ICD-10-CM | POA: Insufficient documentation

## 2021-12-27 DIAGNOSIS — I10 Essential (primary) hypertension: Secondary | ICD-10-CM | POA: Diagnosis not present

## 2021-12-27 DIAGNOSIS — I252 Old myocardial infarction: Secondary | ICD-10-CM | POA: Diagnosis not present

## 2021-12-27 DIAGNOSIS — R509 Fever, unspecified: Secondary | ICD-10-CM | POA: Diagnosis not present

## 2021-12-27 DIAGNOSIS — E119 Type 2 diabetes mellitus without complications: Secondary | ICD-10-CM | POA: Diagnosis not present

## 2021-12-27 HISTORY — DX: Allergy to other foods: Z91.018

## 2021-12-27 HISTORY — DX: Calculus of kidney: N20.0

## 2021-12-27 HISTORY — DX: Tubulo-interstitial nephritis, not specified as acute or chronic: N12

## 2021-12-28 DIAGNOSIS — I10 Essential (primary) hypertension: Secondary | ICD-10-CM | POA: Diagnosis not present

## 2021-12-28 DIAGNOSIS — C482 Malignant neoplasm of peritoneum, unspecified: Secondary | ICD-10-CM | POA: Diagnosis not present

## 2021-12-28 DIAGNOSIS — Z7902 Long term (current) use of antithrombotics/antiplatelets: Secondary | ICD-10-CM | POA: Diagnosis not present

## 2021-12-28 DIAGNOSIS — Z79899 Other long term (current) drug therapy: Secondary | ICD-10-CM | POA: Diagnosis not present

## 2021-12-28 DIAGNOSIS — R1901 Right upper quadrant abdominal swelling, mass and lump: Secondary | ICD-10-CM | POA: Diagnosis not present

## 2021-12-28 DIAGNOSIS — E119 Type 2 diabetes mellitus without complications: Secondary | ICD-10-CM | POA: Diagnosis not present

## 2021-12-28 DIAGNOSIS — C61 Malignant neoplasm of prostate: Secondary | ICD-10-CM | POA: Diagnosis not present

## 2021-12-28 DIAGNOSIS — N12 Tubulo-interstitial nephritis, not specified as acute or chronic: Secondary | ICD-10-CM | POA: Diagnosis not present

## 2021-12-28 DIAGNOSIS — N289 Disorder of kidney and ureter, unspecified: Secondary | ICD-10-CM | POA: Diagnosis not present

## 2021-12-28 DIAGNOSIS — I2510 Atherosclerotic heart disease of native coronary artery without angina pectoris: Secondary | ICD-10-CM | POA: Diagnosis not present

## 2021-12-28 DIAGNOSIS — N2889 Other specified disorders of kidney and ureter: Secondary | ICD-10-CM | POA: Diagnosis not present

## 2021-12-29 DIAGNOSIS — Z951 Presence of aortocoronary bypass graft: Secondary | ICD-10-CM | POA: Diagnosis not present

## 2021-12-29 DIAGNOSIS — C61 Malignant neoplasm of prostate: Secondary | ICD-10-CM | POA: Diagnosis not present

## 2021-12-29 DIAGNOSIS — K669 Disorder of peritoneum, unspecified: Secondary | ICD-10-CM | POA: Diagnosis not present

## 2021-12-29 DIAGNOSIS — N12 Tubulo-interstitial nephritis, not specified as acute or chronic: Secondary | ICD-10-CM | POA: Diagnosis not present

## 2021-12-29 DIAGNOSIS — I10 Essential (primary) hypertension: Secondary | ICD-10-CM | POA: Diagnosis not present

## 2021-12-29 DIAGNOSIS — Z7902 Long term (current) use of antithrombotics/antiplatelets: Secondary | ICD-10-CM | POA: Diagnosis not present

## 2021-12-29 DIAGNOSIS — N289 Disorder of kidney and ureter, unspecified: Secondary | ICD-10-CM | POA: Diagnosis not present

## 2021-12-29 DIAGNOSIS — Z79899 Other long term (current) drug therapy: Secondary | ICD-10-CM | POA: Diagnosis not present

## 2021-12-29 DIAGNOSIS — E119 Type 2 diabetes mellitus without complications: Secondary | ICD-10-CM | POA: Diagnosis not present

## 2021-12-29 DIAGNOSIS — I2510 Atherosclerotic heart disease of native coronary artery without angina pectoris: Secondary | ICD-10-CM | POA: Diagnosis not present

## 2021-12-29 DIAGNOSIS — B9620 Unspecified Escherichia coli [E. coli] as the cause of diseases classified elsewhere: Secondary | ICD-10-CM | POA: Diagnosis not present

## 2021-12-30 DIAGNOSIS — Z7984 Long term (current) use of oral hypoglycemic drugs: Secondary | ICD-10-CM | POA: Diagnosis not present

## 2021-12-30 DIAGNOSIS — N200 Calculus of kidney: Secondary | ICD-10-CM | POA: Diagnosis not present

## 2021-12-30 DIAGNOSIS — D696 Thrombocytopenia, unspecified: Secondary | ICD-10-CM | POA: Diagnosis not present

## 2021-12-30 DIAGNOSIS — N10 Acute tubulo-interstitial nephritis: Secondary | ICD-10-CM | POA: Diagnosis not present

## 2021-12-30 DIAGNOSIS — K668 Other specified disorders of peritoneum: Secondary | ICD-10-CM | POA: Diagnosis not present

## 2021-12-30 DIAGNOSIS — Z792 Long term (current) use of antibiotics: Secondary | ICD-10-CM | POA: Diagnosis not present

## 2021-12-30 DIAGNOSIS — B9620 Unspecified Escherichia coli [E. coli] as the cause of diseases classified elsewhere: Secondary | ICD-10-CM | POA: Diagnosis not present

## 2021-12-30 DIAGNOSIS — Z8546 Personal history of malignant neoplasm of prostate: Secondary | ICD-10-CM | POA: Diagnosis not present

## 2021-12-30 DIAGNOSIS — Z452 Encounter for adjustment and management of vascular access device: Secondary | ICD-10-CM | POA: Diagnosis not present

## 2021-12-30 DIAGNOSIS — I10 Essential (primary) hypertension: Secondary | ICD-10-CM | POA: Diagnosis not present

## 2021-12-30 DIAGNOSIS — C649 Malignant neoplasm of unspecified kidney, except renal pelvis: Secondary | ICD-10-CM | POA: Diagnosis not present

## 2021-12-30 DIAGNOSIS — E119 Type 2 diabetes mellitus without complications: Secondary | ICD-10-CM | POA: Diagnosis not present

## 2021-12-30 DIAGNOSIS — C61 Malignant neoplasm of prostate: Secondary | ICD-10-CM | POA: Diagnosis not present

## 2021-12-30 DIAGNOSIS — I2510 Atherosclerotic heart disease of native coronary artery without angina pectoris: Secondary | ICD-10-CM | POA: Diagnosis not present

## 2021-12-31 DIAGNOSIS — N12 Tubulo-interstitial nephritis, not specified as acute or chronic: Secondary | ICD-10-CM | POA: Diagnosis not present

## 2022-01-01 DIAGNOSIS — N12 Tubulo-interstitial nephritis, not specified as acute or chronic: Secondary | ICD-10-CM | POA: Diagnosis not present

## 2022-01-02 DIAGNOSIS — N12 Tubulo-interstitial nephritis, not specified as acute or chronic: Secondary | ICD-10-CM | POA: Diagnosis not present

## 2022-01-03 DIAGNOSIS — N12 Tubulo-interstitial nephritis, not specified as acute or chronic: Secondary | ICD-10-CM | POA: Diagnosis not present

## 2022-01-04 ENCOUNTER — Inpatient Hospital Stay: Payer: Medicare HMO | Attending: Physician Assistant | Admitting: Physician Assistant

## 2022-01-04 ENCOUNTER — Other Ambulatory Visit: Payer: Self-pay

## 2022-01-04 ENCOUNTER — Inpatient Hospital Stay: Payer: Medicare HMO

## 2022-01-04 VITALS — BP 138/72 | HR 62 | Temp 97.7°F | Resp 20 | Wt 188.7 lb

## 2022-01-04 DIAGNOSIS — E119 Type 2 diabetes mellitus without complications: Secondary | ICD-10-CM | POA: Diagnosis not present

## 2022-01-04 DIAGNOSIS — K669 Disorder of peritoneum, unspecified: Secondary | ICD-10-CM

## 2022-01-04 DIAGNOSIS — I119 Hypertensive heart disease without heart failure: Secondary | ICD-10-CM | POA: Insufficient documentation

## 2022-01-04 DIAGNOSIS — K769 Liver disease, unspecified: Secondary | ICD-10-CM | POA: Diagnosis not present

## 2022-01-04 DIAGNOSIS — Z79899 Other long term (current) drug therapy: Secondary | ICD-10-CM | POA: Diagnosis not present

## 2022-01-04 DIAGNOSIS — N2889 Other specified disorders of kidney and ureter: Secondary | ICD-10-CM | POA: Diagnosis not present

## 2022-01-04 DIAGNOSIS — R1909 Other intra-abdominal and pelvic swelling, mass and lump: Secondary | ICD-10-CM | POA: Insufficient documentation

## 2022-01-04 DIAGNOSIS — N12 Tubulo-interstitial nephritis, not specified as acute or chronic: Secondary | ICD-10-CM | POA: Diagnosis not present

## 2022-01-04 DIAGNOSIS — C7A8 Other malignant neuroendocrine tumors: Secondary | ICD-10-CM | POA: Diagnosis not present

## 2022-01-04 DIAGNOSIS — E785 Hyperlipidemia, unspecified: Secondary | ICD-10-CM | POA: Diagnosis not present

## 2022-01-04 DIAGNOSIS — Z8546 Personal history of malignant neoplasm of prostate: Secondary | ICD-10-CM | POA: Diagnosis not present

## 2022-01-04 DIAGNOSIS — C7B8 Other secondary neuroendocrine tumors: Secondary | ICD-10-CM | POA: Insufficient documentation

## 2022-01-04 HISTORY — DX: Disorder of peritoneum, unspecified: K66.9

## 2022-01-04 LAB — CMP (CANCER CENTER ONLY)
ALT: 10 U/L (ref 0–44)
AST: 12 U/L — ABNORMAL LOW (ref 15–41)
Albumin: 4 g/dL (ref 3.5–5.0)
Alkaline Phosphatase: 53 U/L (ref 38–126)
Anion gap: 6 (ref 5–15)
BUN: 9 mg/dL (ref 8–23)
CO2: 27 mmol/L (ref 22–32)
Calcium: 9.3 mg/dL (ref 8.9–10.3)
Chloride: 108 mmol/L (ref 98–111)
Creatinine: 1.23 mg/dL (ref 0.61–1.24)
GFR, Estimated: >60 mL/min (ref >60)
Glucose, Bld: 128 mg/dL — ABNORMAL HIGH (ref 70–99)
Potassium: 3.8 mmol/L (ref 3.5–5.1)
Sodium: 141 mmol/L (ref 135–145)
Total Bilirubin: 0.7 mg/dL (ref 0.3–1.2)
Total Protein: 6.6 g/dL (ref 6.5–8.1)

## 2022-01-04 LAB — CBC WITH DIFFERENTIAL (CANCER CENTER ONLY)
Abs Immature Granulocytes: 0.02 10*3/uL (ref 0.00–0.07)
Basophils Absolute: 0.1 10*3/uL (ref 0.0–0.1)
Basophils Relative: 1 %
Eosinophils Absolute: 0.5 10*3/uL (ref 0.0–0.5)
Eosinophils Relative: 6 %
HCT: 38.4 % — ABNORMAL LOW (ref 39.0–52.0)
Hemoglobin: 13.7 g/dL (ref 13.0–17.0)
Immature Granulocytes: 0 %
Lymphocytes Relative: 7 %
Lymphs Abs: 0.6 10*3/uL — ABNORMAL LOW (ref 0.7–4.0)
MCH: 31.4 pg (ref 26.0–34.0)
MCHC: 35.7 g/dL (ref 30.0–36.0)
MCV: 88.1 fL (ref 80.0–100.0)
Monocytes Absolute: 0.7 10*3/uL (ref 0.1–1.0)
Monocytes Relative: 8 %
Neutro Abs: 6.2 10*3/uL (ref 1.7–7.7)
Neutrophils Relative %: 78 %
Platelet Count: 182 10*3/uL (ref 150–400)
RBC: 4.36 MIL/uL (ref 4.22–5.81)
RDW: 11.9 % (ref 11.5–15.5)
WBC Count: 8 10*3/uL (ref 4.0–10.5)
nRBC: 0 % (ref 0.0–0.2)

## 2022-01-04 LAB — CEA (IN HOUSE-CHCC): CEA (CHCC-In House): 1.6 ng/mL (ref 0.00–5.00)

## 2022-01-04 NOTE — Progress Notes (Signed)
[place] [phone]   [phone]  INITIAL CONSULTATION:  Patient Care Team: Cyndi Bender, Hershal Coria as PCP - General (Physician Assistant) Jacolyn Reedy, MD as Attending Physician (Cardiology) Arloa Koh, MD (Inactive) as Attending Physician (Radiation Oncology) Rana Snare, MD (Inactive) as Attending Physician (Urology)  CHIEF COMPLAINTS/PURPOSE OF CONSULTATION:  Right renal mass and peritoneal nodularity  HISTORY OF PRESENTING ILLNESS:  Daniel Gibson 71 y.o. male with medical history significant for prostate cancer that was treated with seed implantation, CAD s/p CABG, diabetes and hyperlipidemia. He presents to the diagnostic clinic due to recent CT imaging concerning for malignancy. He is accompanied by his wife and son for this visit.   On review of the previous records, Daniel Gibson presented to the [facility] ER on 12/11/2021 for painless hematuria and urinary urgency.  Work-up included urinalysis showed large leukocyte esterase, blood, protein, positive nitrates.  Urine culture grew E. coli.  Patient was treated with cefdinir.  He underwent CT imaging that showed a nonobstructing 6 mm stone in the interpolar left kidney, indeterminate 2.9 x 3.4 x 4.3 cm lesion in the upper pole right kidney, multiple peritoneal and omental nodules concerning for peritoneal carcinomatosis.  On exam today, Daniel Gibson reports that he is fatigued which has affected his ability to complete his daily activities. He has decreased appetite and has noticed progressive weight loss over the last several years. He has lost approximately 8 lbs since October 2022. He denies any nausea, vomiting or abdominal pain. His bowel habits are unchanged without any recurrent episodes of diarrhea or constipation. He does take stool softeners as needed. He reports occasional episodes of painless hematuria. He is due to complete his IV antibiotics in a  couple of days of UTI/pyelonephritis. He is scheduled to follow up with urologist, Dr. Tresa Moore next week. He denies fevers, chills,sweats, shortness of breath, chest pain or cough. He has no other complaints. Rest of the 10 point ROS is below.   MEDICAL HISTORY:  Past Medical History:  Diagnosis Date   Carotid artery disease ([place]) 05/08/2012   CEA on left 2013   Coronary artery disease    Diabetes mellitus without complication ([place])    Food allergy    Alpha Gal allergy    Hyperlipidemia    Hypertensive heart disease without CHF 04/08/2018   Myocardial infarction ([place]) 1993   medically managed   Obesity (BMI 30-39.9) 04/08/2018   Prostate cancer ([place]) 08/07/2016   PVC's (premature ventricular contractions) 04/08/2018   Thrombocytopenia ([place])     SURGICAL HISTORY: Past Surgical History:  Procedure Laterality Date   APPENDECTOMY     CAROTID ENDARTERECTOMY Right 06-07-12   cea   CORONARY ARTERY BYPASS GRAFT N/A 10/18/2020   Procedure: CORONARY ARTERY BYPASS GRAFTING (CABG)x3. LEFT INTERNAL MAMMARY ARTERY. RIGHT ENDOSCOPIC SAPHENOUS VEIN HARVESTING.;  Surgeon: Wonda Olds, MD;  Location: [facility];  Service: Open Heart Surgery;  Laterality: N/A;   ENDARTERECTOMY  06/07/2012   Procedure: ENDARTERECTOMY CAROTID;  Surgeon: Angelia Mould, MD;  Location: [facility] OR;  Service: Vascular;  Laterality: Right;  Right Carotid Endarterectomy with Patch Angioplasty   LEFT HEART CATH AND CORONARY ANGIOGRAPHY N/A 10/07/2020   Procedure: LEFT HEART CATH AND CORONARY ANGIOGRAPHY;  Surgeon: Troy Sine, MD;  Location: [facility] LAB;  Service: Cardiovascular;  Laterality: N/A;   TEE WITHOUT CARDIOVERSION N/A 10/18/2020   Procedure: TRANSESOPHAGEAL ECHOCARDIOGRAM (TEE);  Surgeon: Wonda Olds, MD;  Location: [facility];  Service: Open Heart Surgery;  Laterality: N/A;   TRANSPERINEAL IMPLANT OF RADIATION SEEDS W/ ULTRASOUND      SOCIAL HISTORY: Social History   Socioeconomic History   Marital  status: Married    Spouse name: Not on file   Number of children: Not on file   Years of education: Not on file   Highest education level: Not on file  Occupational History   Not on file  Tobacco Use   Smoking status: Never   Smokeless tobacco: Never  Vaping Use   Vaping Use: Never used  Substance and Sexual Activity   Alcohol use: No   Drug use: No   Sexual activity: Not on file  Other Topics Concern   Not on file  Social History Narrative   Not on file   Social Determinants of Health   Financial Resource Strain: Not on file  Food Insecurity: Not on file  Transportation Needs: Not on file  Physical Activity: Not on file  Stress: Not on file  Social Connections: Not on file  Intimate Partner Violence: Not on file    FAMILY HISTORY: Family History  Problem Relation Age of Onset   Cancer Mother    Hyperlipidemia Mother    Hypertension Mother    Heart attack Father    Heart disease Father        Heart Disease before age 3   Hyperlipidemia Father    Hypertension Father    Diabetes Sister    Heart disease Sister        before age 52   Hypertension Sister    Hyperlipidemia Sister    Diabetes Brother    Heart disease Brother        before age 55   Hyperlipidemia Brother    Hypertension Brother     ALLERGIES:  is allergic to ace inhibitors, albumin (human), beef-derived products, gelfoam-jmi sponge, heparin, pork-derived products, sheep-derived products, and tetanus toxoids.  MEDICATIONS:  Current Outpatient Medications  Medication Sig Dispense Refill   amLODipine (NORVASC) 10 MG tablet Take 10 mg by mouth in the morning.     carvedilol (COREG) 25 MG tablet Take 12.5 mg by mouth 2 (two) times daily with a meal.     cefTRIAXone 1 g in dextrose 5 % 50 mL Inject 1 g into the vein daily. Last time this was given was 7/6     clopidogrel (PLAVIX) 75 MG tablet TAKE 1 TABLET BY MOUTH EVERY DAY 90 tablet 3   EPINEPHrine (EPIPEN 2-PAK) 0.3 mg/0.3 mL IJ SOAJ injection  Use as directed for life-threatening allergic reaction. 2 each 3   ezetimibe (ZETIA) 10 MG tablet TAKE 1 TABLET BY MOUTH EVERY DAY 90 tablet 3   metFORMIN (GLUCOPHAGE) 1000 MG tablet Take 500 mg by mouth in the morning and at bedtime.     oxybutynin (DITROPAN) 5 MG tablet Take by mouth.     pantoprazole (PROTONIX) 40 MG tablet TAKE 1 TABLET BY MOUTH EVERY DAY 90 tablet 3   Polyethylene Glycol 3350 (MIRALAX PO) Take by mouth as needed.     rosuvastatin (CRESTOR) 40 MG tablet TAKE 1 TABLET BY MOUTH EVERY DAY 90 tablet 1   Tamsulosin HCl (FLOMAX) 0.4 MG CAPS Take 0.8 mg by mouth at bedtime.     traZODone (DESYREL) 50 MG tablet Take 50 mg by mouth at bedtime.     No current facility-administered medications for this visit.    REVIEW OF SYSTEMS:   Constitutional: ( - ) fevers, ( - )  chills , ( - ) night sweats Eyes: ( - ) blurriness of vision, ( - ) double vision, ( - ) watery eyes Ears, nose, mouth, throat, and face: ( - ) mucositis, ( - ) sore throat Respiratory: ( - ) cough, ( - ) dyspnea, ( - ) wheezes Cardiovascular: ( - ) palpitation, ( - ) chest discomfort, ( - ) lower extremity swelling Gastrointestinal:  ( - ) nausea, ( - ) heartburn, ( - ) change in bowel habits Skin: ( - ) abnormal skin rashes Lymphatics: ( - ) new lymphadenopathy, ( - ) easy bruising Neurological: ( - ) numbness, ( - ) tingling, ( - ) new weaknesses Behavioral/Psych: ( - ) mood change, ( - ) new changes  All other systems were reviewed with the patient and are negative.  PHYSICAL EXAMINATION: ECOG PERFORMANCE STATUS: 1 - Symptomatic but completely ambulatory  Vitals:   01/04/22 1208  BP: 138/72  Pulse: 62  Resp: 20  Temp: 97.7 F (36.5 C)  SpO2: 97%   Filed Weights   01/04/22 1208  Weight: 188 lb 11.2 oz (85.6 kg)    GENERAL: well appearing male in NAD  SKIN: skin color, texture, turgor are normal, no rashes or significant lesions EYES: conjunctiva are pink and non-injected, sclera  clear OROPHARYNX: no exudate, no erythema; lips, buccal mucosa, and tongue normal  NECK: supple, non-tender LYMPH:  no palpable lymphadenopathy in the cervical or supraclavicular lymph nodes.  LUNGS: clear to auscultation and percussion with normal breathing effort HEART: regular rate & rhythm and no murmurs and no lower extremity edema ABDOMEN: soft, non-tender, non-distended, normal bowel sounds Musculoskeletal: no cyanosis of digits and no clubbing  PSYCH: alert & oriented x 3, fluent speech NEURO: no focal motor/sensory deficits  LABORATORY DATA:  I have reviewed the data as listed    Latest Ref Rng & Units 01/04/2022    1:20 PM 11/29/2020   10:49 AM 10/21/2020   12:35 AM  CBC  WBC 4.0 - 10.5 K/uL 8.0  8.6  12.6   Hemoglobin 13.0 - 17.0 g/dL 13.7  14.2  10.6   Hematocrit 39.0 - 52.0 % 38.4  42.3  29.3   Platelets 150 - 400 K/uL 182  201  112        Latest Ref Rng & Units 01/04/2022    1:20 PM 11/29/2020   10:49 AM 10/21/2020   12:35 AM  CMP  Glucose 70 - 99 mg/dL 128  173  149   BUN 8 - 23 mg/dL '9  13  12   '$ Creatinine 0.61 - 1.24 mg/dL 1.23  1.12  1.12   Sodium 135 - 145 mmol/L 141  136  134   Potassium 3.5 - 5.1 mmol/L 3.8  4.4  3.4   Chloride 98 - 111 mmol/L 108  98  100   CO2 22 - 32 mmol/L '27  23  26   '$ Calcium 8.9 - 10.3 mg/dL 9.3  9.7  7.8   Total Protein 6.5 - 8.1 g/dL 6.6  6.4    Total Bilirubin 0.3 - 1.2 mg/dL 0.7  0.6    Alkaline Phos 38 - 126 U/L 53  92    AST 15 - 41 U/L 12  11    ALT 0 - 44 U/L 10  8       RADIOGRAPHIC STUDIES: I have personally reviewed the radiological images as listed and agreed with the findings in the report. No results found.  ASSESSMENT &  PLAN Daniel Gibson is a 71 y.o. male who presents to the diagnostic clinic for recent abnormal CT imaging concerning for malignancy. I reviewed the outside CT reports in detail with the patient and his family. Findings are concerning for metastatic disease with right renal mass along with  peritoneal nodularity and indeterminate liver lesion. As we await images to be pushed to our system, we will proceed with serologic workup today followed by requesting a CT chest. PSA level was checked on 12/28/2021 and was normal at <0.04. To confirm diagnosis, we recommend tissue sampling. Possible target lesions include peritoneal nodularity in LUQ versus liver lesion. We will consult with interventional radiology team to determine which lesion is most amenable for biopsy.  #Peritoneal nodularity/liver lesion/right renal mass: --Concerning for underlying malignancy with metastatic disease --PSA level from 12/28/2021 is normal, low suspicion for recurrent prostate cancer --Labs today to check CBC, CMP. Check CEA and CA-125 tumor markers --Will request STAT CT chest to evaluate for other potential sites of metastatic disease --Will request STAT biopsy of peritoneal nodularity.  --Follow up appts with medical oncology will be scheduled once diagnosis is confirmed.   #UTI/Pyelonephritis: --Currently receiving IV ceftriaxone x 10 days, scheduled to finish on 01/06/2022.  Orders Placed This Encounter  Procedures   CT Chest W Contrast    Standing Status:   Future    Standing Expiration Date:   01/04/2023    Order Specific Question:   If indicated for the ordered procedure, I authorize the administration of contrast media per Radiology protocol    Answer:   Yes    Order Specific Question:   Preferred imaging location?    Answer:   [facility]   CT ABDOMINAL MASS BIOPSY    Aletta Edouard, MD  Cordelia Poche; Roosvelt Maser OK for CT guided biopsy of peritoneal, omental mass(es).   GY           Standing Status:   Future    Standing Expiration Date:   01/04/2023    Order Specific Question:   Lab orders requested (DO NOT place separate lab orders, these will be automatically ordered during procedure specimen collection):    Answer:   Surgical Pathology    Order Specific  Question:   Reason for Exam (SYMPTOM  OR DIAGNOSIS REQUIRED)    Answer:   peritoneal nodularity, concerning for metastatic disease.    Order Specific Question:   Preferred location?    Answer:   [facility]   CBC with Differential (Cancer Center Only)    Standing Status:   Future    Number of Occurrences:   1    Standing Expiration Date:   01/05/2023   CMP ([place] only)    Standing Status:   Future    Number of Occurrences:   1    Standing Expiration Date:   01/05/2023   CA 125    Standing Status:   Future    Number of Occurrences:   1    Standing Expiration Date:   01/04/2023   CEA (IN HOUSE-[facility])FOR [facility] ONLY    Standing Status:   Future    Number of Occurrences:   1    Standing Expiration Date:   01/05/2023    All questions were answered. The patient knows to call the clinic with any problems, questions or concerns.  I have spent a total of 60 minutes minutes of face-to-face and non-face-to-face time, preparing to see the patient,  obtaining and/or reviewing separately obtained history, performing a medically appropriate examination, counseling and educating the patient, ordering tests/procedures, referring and communicating with other health care professionals, documenting clinical information in the electronic health record, independently interpreting results and communicating results to the patient, and care coordination.   Dede Query, PA-C Department of Hematology/Oncology [place] at [facility] Phone: [phone]  Patient was seen with Dr. Lorenso Courier  I have read the above note and personally examined the patient. I agree with the assessment and plan as noted above.  Briefly Daniel Gibson is a 71 year old male who presents for evaluation of concerning findings on CT scan.  At this time he has a renal mass measuring approximately 4.4 cm and omental nodules concerning for metastatic disease.  Additionally he has a hepatic lesion noted  to be approximately 2.3 cm.  At this time would recommend a CT scan of the chest to complete staging as well as a referral to interventional radiology for consideration of biopsy of an omental mass.  Additionally could consider biopsy of the liver mass and MRI could be ordered if requested by the IR team.  We will also order tumor markers to include CEA and CA 19-9.  Daniel Gibson and his family voiced understanding of the plan moving forward.   Ledell Peoples, MD Department of Hematology/Oncology [place] at [facility] Phone: [phone] Pager: [phone] Email: [email or web address]

## 2022-01-05 ENCOUNTER — Ambulatory Visit
Admission: RE | Admit: 2022-01-05 | Discharge: 2022-01-05 | Disposition: A | Discharge status: Home / Self Care | Payer: Self-pay | Source: Ambulatory Visit | Attending: Physician Assistant | Admitting: Physician Assistant

## 2022-01-05 ENCOUNTER — Other Ambulatory Visit: Payer: Self-pay

## 2022-01-05 DIAGNOSIS — K669 Disorder of peritoneum, unspecified: Secondary | ICD-10-CM

## 2022-01-05 DIAGNOSIS — N12 Tubulo-interstitial nephritis, not specified as acute or chronic: Secondary | ICD-10-CM | POA: Diagnosis not present

## 2022-01-05 LAB — CA 125: Cancer Antigen (CA) 125: 17.9 U/mL

## 2022-01-06 ENCOUNTER — Encounter: Payer: Self-pay | Admitting: *Deleted

## 2022-01-06 DIAGNOSIS — N12 Tubulo-interstitial nephritis, not specified as acute or chronic: Secondary | ICD-10-CM | POA: Diagnosis not present

## 2022-01-06 NOTE — Progress Notes (Unsigned)
Richardo Priest, MD  Roosvelt Maser Yes usually resume 48 hrs after

## 2022-01-06 NOTE — Progress Notes (Unsigned)
Aletta Edouard, MD  Cordelia Poche; Roosvelt Maser OK for CT guided biopsy of peritoneal, omental mass(es).   GY

## 2022-01-09 DIAGNOSIS — N12 Tubulo-interstitial nephritis, not specified as acute or chronic: Secondary | ICD-10-CM | POA: Diagnosis not present

## 2022-01-09 DIAGNOSIS — D696 Thrombocytopenia, unspecified: Secondary | ICD-10-CM | POA: Diagnosis not present

## 2022-01-09 DIAGNOSIS — N2889 Other specified disorders of kidney and ureter: Secondary | ICD-10-CM | POA: Diagnosis not present

## 2022-01-10 ENCOUNTER — Encounter (HOSPITAL_COMMUNITY): Payer: Self-pay

## 2022-01-10 ENCOUNTER — Ambulatory Visit (HOSPITAL_COMMUNITY)
Admission: RE | Admit: 2022-01-10 | Discharge: 2022-01-10 | Disposition: A | Discharge status: Home / Self Care | Payer: Medicare HMO | Source: Ambulatory Visit | Attending: Physician Assistant | Admitting: Physician Assistant

## 2022-01-10 ENCOUNTER — Other Ambulatory Visit: Payer: Self-pay

## 2022-01-10 ENCOUNTER — Observation Stay (HOSPITAL_COMMUNITY)
Admission: EM | Admit: 2022-01-10 | Discharge: 2022-01-11 | Disposition: A | Discharge status: Home / Self Care | Payer: Medicare HMO | Attending: Internal Medicine | Admitting: Internal Medicine

## 2022-01-10 DIAGNOSIS — J8410 Pulmonary fibrosis, unspecified: Secondary | ICD-10-CM | POA: Diagnosis not present

## 2022-01-10 DIAGNOSIS — Z8546 Personal history of malignant neoplasm of prostate: Secondary | ICD-10-CM | POA: Insufficient documentation

## 2022-01-10 DIAGNOSIS — I2699 Other pulmonary embolism without acute cor pulmonale: Secondary | ICD-10-CM | POA: Diagnosis not present

## 2022-01-10 DIAGNOSIS — I2693 Single subsegmental pulmonary embolism without acute cor pulmonale: Secondary | ICD-10-CM | POA: Insufficient documentation

## 2022-01-10 DIAGNOSIS — I700 Atherosclerosis of aorta: Secondary | ICD-10-CM | POA: Diagnosis not present

## 2022-01-10 DIAGNOSIS — R9431 Abnormal electrocardiogram [ECG] [EKG]: Secondary | ICD-10-CM | POA: Insufficient documentation

## 2022-01-10 DIAGNOSIS — C61 Malignant neoplasm of prostate: Secondary | ICD-10-CM | POA: Diagnosis not present

## 2022-01-10 DIAGNOSIS — Z7901 Long term (current) use of anticoagulants: Secondary | ICD-10-CM | POA: Diagnosis not present

## 2022-01-10 DIAGNOSIS — E785 Hyperlipidemia, unspecified: Secondary | ICD-10-CM | POA: Diagnosis not present

## 2022-01-10 DIAGNOSIS — Z7902 Long term (current) use of antithrombotics/antiplatelets: Secondary | ICD-10-CM | POA: Diagnosis not present

## 2022-01-10 DIAGNOSIS — Z923 Personal history of irradiation: Secondary | ICD-10-CM | POA: Insufficient documentation

## 2022-01-10 DIAGNOSIS — Z951 Presence of aortocoronary bypass graft: Secondary | ICD-10-CM | POA: Diagnosis not present

## 2022-01-10 DIAGNOSIS — Z8249 Family history of ischemic heart disease and other diseases of the circulatory system: Secondary | ICD-10-CM | POA: Diagnosis not present

## 2022-01-10 DIAGNOSIS — I252 Old myocardial infarction: Secondary | ICD-10-CM | POA: Insufficient documentation

## 2022-01-10 DIAGNOSIS — E119 Type 2 diabetes mellitus without complications: Secondary | ICD-10-CM | POA: Diagnosis not present

## 2022-01-10 DIAGNOSIS — Z87440 Personal history of urinary (tract) infections: Secondary | ICD-10-CM | POA: Insufficient documentation

## 2022-01-10 DIAGNOSIS — K669 Disorder of peritoneum, unspecified: Secondary | ICD-10-CM | POA: Insufficient documentation

## 2022-01-10 DIAGNOSIS — R3915 Urgency of urination: Secondary | ICD-10-CM | POA: Diagnosis not present

## 2022-01-10 DIAGNOSIS — I119 Hypertensive heart disease without heart failure: Secondary | ICD-10-CM | POA: Insufficient documentation

## 2022-01-10 DIAGNOSIS — I2510 Atherosclerotic heart disease of native coronary artery without angina pectoris: Secondary | ICD-10-CM | POA: Insufficient documentation

## 2022-01-10 DIAGNOSIS — N2889 Other specified disorders of kidney and ureter: Secondary | ICD-10-CM | POA: Insufficient documentation

## 2022-01-10 DIAGNOSIS — E876 Hypokalemia: Secondary | ICD-10-CM | POA: Insufficient documentation

## 2022-01-10 DIAGNOSIS — Z79899 Other long term (current) drug therapy: Secondary | ICD-10-CM | POA: Insufficient documentation

## 2022-01-10 DIAGNOSIS — Z7984 Long term (current) use of oral hypoglycemic drugs: Secondary | ICD-10-CM | POA: Insufficient documentation

## 2022-01-10 DIAGNOSIS — R918 Other nonspecific abnormal finding of lung field: Secondary | ICD-10-CM | POA: Diagnosis not present

## 2022-01-10 DIAGNOSIS — I491 Atrial premature depolarization: Secondary | ICD-10-CM | POA: Insufficient documentation

## 2022-01-10 DIAGNOSIS — J984 Other disorders of lung: Secondary | ICD-10-CM | POA: Insufficient documentation

## 2022-01-10 DIAGNOSIS — Z833 Family history of diabetes mellitus: Secondary | ICD-10-CM | POA: Insufficient documentation

## 2022-01-10 DIAGNOSIS — Z8349 Family history of other endocrine, nutritional and metabolic diseases: Secondary | ICD-10-CM | POA: Insufficient documentation

## 2022-01-10 DIAGNOSIS — Z6825 Body mass index (BMI) 25.0-25.9, adult: Secondary | ICD-10-CM | POA: Diagnosis not present

## 2022-01-10 DIAGNOSIS — D49511 Neoplasm of unspecified behavior of right kidney: Secondary | ICD-10-CM | POA: Diagnosis not present

## 2022-01-10 DIAGNOSIS — D649 Anemia, unspecified: Secondary | ICD-10-CM | POA: Insufficient documentation

## 2022-01-10 DIAGNOSIS — K668 Other specified disorders of peritoneum: Secondary | ICD-10-CM | POA: Insufficient documentation

## 2022-01-10 DIAGNOSIS — K769 Liver disease, unspecified: Secondary | ICD-10-CM | POA: Insufficient documentation

## 2022-01-10 DIAGNOSIS — N5201 Erectile dysfunction due to arterial insufficiency: Secondary | ICD-10-CM | POA: Diagnosis not present

## 2022-01-10 DIAGNOSIS — R9389 Abnormal findings on diagnostic imaging of other specified body structures: Secondary | ICD-10-CM | POA: Diagnosis present

## 2022-01-10 DIAGNOSIS — E669 Obesity, unspecified: Secondary | ICD-10-CM | POA: Insufficient documentation

## 2022-01-10 LAB — BASIC METABOLIC PANEL
Anion gap: 6 (ref 5–15)
BUN: 12 mg/dL (ref 8–23)
CO2: 25 mmol/L (ref 22–32)
Calcium: 8.8 mg/dL — ABNORMAL LOW (ref 8.9–10.3)
Chloride: 108 mmol/L (ref 98–111)
Creatinine, Ser: 1.02 mg/dL (ref 0.61–1.24)
GFR, Estimated: >60 mL/min (ref >60)
Glucose, Bld: 98 mg/dL (ref 70–99)
Potassium: 3.3 mmol/L — ABNORMAL LOW (ref 3.5–5.1)
Sodium: 139 mmol/L (ref 135–145)

## 2022-01-10 LAB — CBC WITH DIFFERENTIAL/PLATELET
Abs Immature Granulocytes: 0.03 10*3/uL (ref 0.00–0.07)
Basophils Absolute: 0.1 10*3/uL (ref 0.0–0.1)
Basophils Relative: 1 %
Eosinophils Absolute: 0.6 10*3/uL — ABNORMAL HIGH (ref 0.0–0.5)
Eosinophils Relative: 6 %
HCT: 36.8 % — ABNORMAL LOW (ref 39.0–52.0)
Hemoglobin: 13 g/dL (ref 13.0–17.0)
Immature Granulocytes: 0 %
Lymphocytes Relative: 8 %
Lymphs Abs: 0.8 10*3/uL (ref 0.7–4.0)
MCH: 30.8 pg (ref 26.0–34.0)
MCHC: 35.3 g/dL (ref 30.0–36.0)
MCV: 87.2 fL (ref 80.0–100.0)
Monocytes Absolute: 0.8 10*3/uL (ref 0.1–1.0)
Monocytes Relative: 8 %
Neutro Abs: 6.9 10*3/uL (ref 1.7–7.7)
Neutrophils Relative %: 77 %
Platelets: 158 10*3/uL (ref 150–400)
RBC: 4.22 MIL/uL (ref 4.22–5.81)
RDW: 11.8 % (ref 11.5–15.5)
WBC: 9.1 10*3/uL (ref 4.0–10.5)
nRBC: 0 % (ref 0.0–0.2)

## 2022-01-10 LAB — TROPONIN I (HIGH SENSITIVITY): Troponin I (High Sensitivity): 4 ng/L (ref <18)

## 2022-01-10 LAB — APTT: aPTT: 20 seconds — ABNORMAL LOW (ref 24–36)

## 2022-01-10 LAB — PROTIME-INR
INR: 1.6 — ABNORMAL HIGH (ref 0.8–1.2)
Prothrombin Time: 18.7 seconds — ABNORMAL HIGH (ref 11.4–15.2)

## 2022-01-10 MED ORDER — HEPARIN BOLUS VIA INFUSION
2300.0000 [IU] | Freq: Once | INTRAVENOUS | Status: AC
Start: 2022-01-10 — End: 2022-01-10
  Administered 2022-01-10: 2300 [IU] via INTRAVENOUS
  Filled 2022-01-10: qty 2300

## 2022-01-10 MED ORDER — IOHEXOL 300 MG/ML  SOLN
75.0000 mL | Freq: Once | INTRAMUSCULAR | Status: AC | PRN
  Administered 2022-01-10: 75 mL via INTRAVENOUS

## 2022-01-10 MED ORDER — HEPARIN (PORCINE) 25000 UT/250ML-% IV SOLN
1350.0000 [IU]/h | INTRAVENOUS | Status: DC
  Administered 2022-01-10 – 2022-01-11 (×2): 1350 [IU]/h via INTRAVENOUS
  Filled 2022-01-10 (×2): qty 250

## 2022-01-10 MED ORDER — RIVAROXABAN 15 MG PO TABS
15.0000 mg | ORAL_TABLET | Freq: Once | ORAL | Status: DC
Start: 2022-01-10 — End: 2022-01-10
  Filled 2022-01-10: qty 1

## 2022-01-10 MED ORDER — RIVAROXABAN 15 MG PO TABS: 15.0000 mg | ORAL_TABLET | Freq: Two times a day (BID) | ORAL | Status: DC

## 2022-01-10 MED ORDER — RIVAROXABAN 20 MG PO TABS: 20.0000 mg | ORAL_TABLET | Freq: Every day | ORAL | Status: DC

## 2022-01-10 NOTE — ED Provider Notes (Signed)
[facility] DEPT Provider Note   CSN: [id number] Arrival date & time: 01/10/22  1930     History  Chief Complaint  Patient presents with   Abnormal CT    Daniel Gibson is a 71 y.o. male.  HPI  71 year old male with medical history significant for DM 2, HLD, MI/CAD on Plavix, HTN, obesity, recent diagnosis of renal cell mass concerning for possible stage IV renal cell carcinoma pending biopsy who presents to the emergency department with concern for new diagnosis of PE.  The patient was undergoing CT imaging for staging and had an incidental diagnosis of right lower lobe PE that is segmental and subsegmental on imaging.  He denies any chest pain or shortness of breath.  He is not hypoxic.  He presents to the emergency department because his oncologist urged him to present for admission for anticoagulation.  He has an upcoming biopsy scheduled on Monday for further evaluation of his malignancy.  Home Medications Prior to Admission medications   Medication Sig Start Date End Date Taking? Authorizing Provider  amLODipine (NORVASC) 10 MG tablet Take 10 mg by mouth in the morning.   Yes [provider]  carvedilol (COREG) 25 MG tablet Take 12.5 mg by mouth 2 (two) times daily with a meal.   Yes [provider]  cefTRIAXone (ROCEPHIN) 10 g injection Inject 1 g into the vein daily. 12/30/21  Yes [provider]  clopidogrel (PLAVIX) 75 MG tablet TAKE 1 TABLET BY MOUTH EVERY DAY Patient taking differently: Take 75 mg by mouth daily. 11/22/21  Yes Richardo Priest, MD  EPINEPHrine (EPIPEN 2-PAK) 0.3 mg/0.3 mL IJ SOAJ injection Use as directed for life-threatening allergic reaction. 05/10/20  Yes Kozlow, Donnamarie Poag, MD  ezetimibe (ZETIA) 10 MG tablet TAKE 1 TABLET BY MOUTH EVERY DAY Patient taking differently: Take 10 mg by mouth daily. 11/22/21  Yes Richardo Priest, MD  metFORMIN (GLUCOPHAGE) 1000 MG tablet Take 500 mg by mouth in the morning  and at bedtime.   Yes [provider]  oxybutynin (DITROPAN) 5 MG tablet Take 5 mg by mouth. 12/30/21 12/30/22 Yes [provider]  pantoprazole (PROTONIX) 40 MG tablet TAKE 1 TABLET BY MOUTH EVERY DAY Patient taking differently: Take 40 mg by mouth daily. 11/14/21  Yes Richardo Priest, MD  Polyethylene Glycol 3350 (MIRALAX PO) Take 1 Package by mouth as needed (constipation).   Yes [provider]  rosuvastatin (CRESTOR) 40 MG tablet TAKE 1 TABLET BY MOUTH EVERY DAY Patient taking differently: Take 40 mg by mouth daily. 09/26/21  Yes Richardo Priest, MD  Tamsulosin HCl (FLOMAX) 0.4 MG CAPS Take 0.8 mg by mouth at bedtime.   Yes [provider]  traZODone (DESYREL) 50 MG tablet Take 50 mg by mouth at bedtime.   Yes [provider]      Allergies    Ace inhibitors, Albumin (human), Beef-derived products, Gelfoam-jmi sponge, Heparin, Pork-derived products, Sheep-derived products, and Tetanus toxoids    Review of Systems   Review of Systems  All other systems reviewed and are negative.   Physical Exam Updated Vital Signs BP (!) 144/70   Pulse (!) 56   Temp 98.3 F (36.8 C) (Oral)   Resp 11   Ht '5\' 10"'$  (1.778 m)   Wt 81.6 kg   SpO2 93%   BMI 25.83 kg/m  Physical Exam Vitals and nursing note reviewed.  Constitutional:      General: He is not in acute  distress.    Appearance: He is well-developed.  HENT:     Head: Normocephalic and atraumatic.  Eyes:     Conjunctiva/sclera: Conjunctivae normal.  Cardiovascular:     Rate and Rhythm: Normal rate and regular rhythm.  Pulmonary:     Effort: Pulmonary effort is normal. No respiratory distress.     Breath sounds: Normal breath sounds.  Abdominal:     Palpations: Abdomen is soft.     Tenderness: There is no abdominal tenderness.  Musculoskeletal:        General: No swelling.     Cervical back: Neck supple.  Skin:    General: Skin is warm and dry.     Capillary Refill: Capillary refill  takes less than 2 seconds.  Neurological:     Mental Status: He is alert.  Psychiatric:        Mood and Affect: Mood normal.     ED Results / Procedures / Treatments   Labs (all labs ordered are listed, but only abnormal results are displayed) Labs Reviewed  CBC WITH DIFFERENTIAL/PLATELET - Abnormal; Notable for the following components:      Result Value   HCT 36.8 (*)    Eosinophils Absolute 0.6 (*)    All other components within normal limits  BASIC METABOLIC PANEL - Abnormal; Notable for the following components:   Potassium 3.3 (*)    Calcium 8.8 (*)    All other components within normal limits  APTT - Abnormal; Notable for the following components:   aPTT 20 (*)    All other components within normal limits  PROTIME-INR - Abnormal; Notable for the following components:   Prothrombin Time 18.7 (*)    INR 1.6 (*)    All other components within normal limits  HEPARIN LEVEL (UNFRACTIONATED)  CBC  TROPONIN I (HIGH SENSITIVITY)    EKG EKG Interpretation  Date/Time:  Tuesday January 10 2022 19:38:47 EDT Ventricular Rate:  69 PR Interval:  139 QRS Duration: 94 QT Interval:  389 QTC Calculation: 417 R Axis:   47 Text Interpretation: Sinus rhythm Atrial premature complex Borderline T wave abnormalities Confirmed by Regan Lemming ([id number]) on 01/10/2022 8:14:31 PM  Radiology CT Chest W Contrast  Result Date: 01/10/2022 CLINICAL DATA:  Renal mass, evaluate for metastatic disease EXAM: CT CHEST WITH CONTRAST TECHNIQUE: Multidetector CT imaging of the chest was performed during intravenous contrast administration. RADIATION DOSE REDUCTION: This exam was performed according to the departmental dose-optimization program which includes automated exposure control, adjustment of the mA and/or kV according to patient size and/or use of iterative reconstruction technique. CONTRAST:  67m OMNIPAQUE IOHEXOL 300 MG/ML  SOLN COMPARISON:  12/28/2021, 12/27/2021 FINDINGS: Cardiovascular: The heart  is unremarkable without pericardial effusion. Diffuse coronary artery atherosclerosis. No evidence of thoracic aortic aneurysm or dissection. Atherosclerosis of the aortic arch and descending thoracic aorta. Incidental filling defects are seen within segmental and subsegmental branches of the right lower lobe pulmonary artery consistent with pulmonary emboli. Clot burden is minimal. No right heart strain. Mediastinum/Nodes: No enlarged mediastinal, hilar, or axillary lymph nodes. Thyroid gland, trachea, and esophagus demonstrate no significant findings. Lungs/Pleura: Minimal patchy ground-glass attenuation right upper lobe image 30 2 May be inflammatory or infectious. Multiple sub 3 mm calcified granulomas are seen within the right lower lobe. No other pulmonary nodules or masses. No effusion or pneumothorax. Central airways are patent. Upper Abdomen: Partial visualization of a known upper pole right renal mass concerning for renal cell carcinoma. Enhancing area within the inferior right  lobe liver on the 12/28/2021 exam, reference image 26/10, is not well visualized on this study due to timing of contrast bolus. Musculoskeletal: No acute or destructive bony lesions. Moderate midthoracic spondylosis. Reconstructed images demonstrate no additional findings. IMPRESSION: 1. Incidental right lower lobe segmental and subsegmental pulmonary emboli. Minimal clot burden without right heart strain. 2. No evidence of intrathoracic metastases. 3. Small focus of ground-glass airspace disease within the right upper lobe likely inflammatory or infectious. 4. Partial visualization of known right renal mass highly concerning for renal cell carcinoma. Indeterminate right lobe liver lesion seen on prior abdominal CT not well visualized on this exam due to timing of contrast bolus. 5. Aortic Atherosclerosis (ICD10-I70.0). Coronary artery atherosclerosis. Critical Value/emergent results were called by telephone at the time of  interpretation on 01/10/2022 at 5:44 pm to provider [facility], who verbally acknowledged these results. Electronically Signed   By: Randa Ngo M.D.   On: 01/10/2022 17:51    Procedures Procedures    Medications Ordered in ED Medications  heparin ADULT infusion 100 units/mL (25000 units/244m) (1,350 Units/hr Intravenous IV Pump [employer] 01/10/22 2143)  potassium chloride SA (KLOR-CON M) CR tablet 20 mEq (has no administration in time range)  heparin bolus via infusion 2,300 Units (2,300 Units Intravenous Bolus from Bag 01/10/22 2138)    ED Course/ Medical Decision Making/ A&P                           Medical Decision Making Amount and/or Complexity of Data Reviewed Labs: ordered. Radiology: ordered.  Risk Prescription drug management. Decision regarding hospitalization.   71year old male with medical history significant for DM 2, HLD, MI/CAD on Plavix, HTN, obesity, recent diagnosis of renal cell mass concerning for possible stage IV renal cell carcinoma pending biopsy who presents to the emergency department with concern for new diagnosis of PE.  The patient was undergoing CT imaging for staging and had an incidental diagnosis of right lower lobe PE that is segmental and subsegmental on imaging.  He denies any chest pain or shortness of breath.  He is not hypoxic.  He presents to the emergency department because his oncologist urged him to present for admission for anticoagulation.  He has an upcoming biopsy scheduled on Monday for further evaluation of his malignancy.  On arrival, the patient was vitally stable, afebrile, not tachycardic or tachypneic, saturating 98% on room air.  The patient's O2 saturations ranged between 93 and 98% on room air.  He was ambulated in the emergency department with no desaturations with a good waveform noted with saturations greater than 94% on room air.  He presents with a new diagnosis of pulmonary embolism.  He does have a noted alpha gal  allergy and after discussion with pharmacy and the risk and benefits of initiating heparin, the patient was subsequently started on heparin with no concern for any previous reaction.  He had previously been administered heparin per chart review during his previous MI.  I discussed the case with Dr. GAlvy Bimlerof on-call oncology who recommended admission for IV anticoagulation with IV heparin as the patient has an upcoming biopsy and so the concern was that starting him on a DOAC would interfere with his upcoming biopsy for ecchymosis and management of his malignancy.  The patient was started on heparin IV and had no abnormal reaction.  CT Imaging: IMPRESSION: 1. Incidental right lower lobe segmental and subsegmental pulmonary emboli. Minimal clot burden without right heart strain. 2.  No evidence of intrathoracic metastases. 3. Small focus of ground-glass airspace disease within the right upper lobe likely inflammatory or infectious. 4. Partial visualization of known right renal mass highly concerning for renal cell carcinoma. Indeterminate right lobe liver lesion seen on prior abdominal CT not well visualized on this exam due to timing of contrast bolus. 5. Aortic Atherosclerosis (ICD10-I70.0). Coronary artery atherosclerosis.  Laboratory work-up significant for a troponin that was normal, CBC without a leukocytosis or anemia, BMP with mild hypokalemia to 3.3.  Following evaluation and assessment, hospitalist medicine was consulted for admission.  The patient was subsequently admitted in stable condition to the hospitalist service.  Dr. Hal Hope accepted the patient in admission. Final Clinical Impression(s) / ED Diagnoses Final diagnoses:  Acute pulmonary embolism, unspecified pulmonary embolism type, unspecified whether acute cor pulmonale present [facility])    Rx / DC Orders ED Discharge Orders     None         Regan Lemming, MD 01/11/22 0000

## 2022-01-10 NOTE — ED Notes (Signed)
Patient given sandwich, applesauce and soda.

## 2022-01-10 NOTE — Progress Notes (Signed)
Patient on schedule for CT Omental mass biopsy, spoke with patient on phone with pre procedure instructions given. Holding Plavix with LD 7/18 per ordering MD. Instructions given to be here on 7/24 @ 0730, NPO after MN prior to procedure and driver post procedure /recovery/discharge. Stated understanding.

## 2022-01-10 NOTE — Progress Notes (Signed)
ANTICOAGULATION CONSULT NOTE - Initial Consult  Pharmacy Consult for heparin Indication: pulmonary embolus  Allergies  Allergen Reactions   Ace Inhibitors Swelling   Albumin (Human)     Potential interaction with Alpha-gal allergy   Beef-Derived Products Anaphylaxis    Alpha-Gal allergy   Gelfoam-Jmi Sponge     Alpha-gal allergy.   Heparin     Potential interaction with alpha-gal allergy   Pork-Derived Products Anaphylaxis    Alpha-Gal allergy   Sheep-Derived Products Anaphylaxis    Alpha-Gal allergy   Tetanus Toxoids Swelling    Patient Measurements: Height: '5\' 10"'$  (177.8 cm) Weight: 81.6 kg (180 lb) IBW/kg (Calculated) : 73 Heparin Dosing Weight: 81.6 kg  Vital Signs: Temp: 98.3 F (36.8 C) (07/18 1939) Temp Source: Oral (07/18 1939) BP: 153/73 (07/18 2100) Pulse Rate: 58 (07/18 2101)  Labs: Recent Labs    01/10/22 1944 01/10/22 2021  HGB 13.0  --   HCT 36.8*  --   PLT 158  --   APTT  --  20*    Estimated Creatinine Clearance: 57.7 mL/min (by C-G formula based on SCr of 1.23 mg/dL).   Medical History: Past Medical History:  Diagnosis Date   Carotid artery disease ([place]) 05/08/2012   CEA on left 2013   Coronary artery disease    Diabetes mellitus without complication ([place])    Food allergy    Alpha Gal allergy    Hyperlipidemia    Hypertensive heart disease without CHF 04/08/2018   Myocardial infarction ([place]) 1993   medically managed   Obesity (BMI 30-39.9) 04/08/2018   Prostate cancer ([place]) 08/07/2016   PVC's (premature ventricular contractions) 04/08/2018   Thrombocytopenia [facility])       Assessment: 71 year old male presented to the ED by recommendation from oncology for finding of PE on outpatient CT scan. CT positive incidental right lower lobe segmental and subsegmental pulmonary emboli, minimal clot burden without right heart strain. Patient is not on anticoagulation PTA, but does take Plavix which he started holding today in anticipation of a  biopsy to be done 7/24. Pharmacy consulted to manage heparin infusion.  Of note, patient with alpha gal allergy - wife states he tolerates pork, cannot eat beef. It appears he received heparin during CABG in April 2022 with no documented issues. Regardless, risk discussed with patient and he provided verbal consent to proceed with heparin with understanding of risk and that he would be closely monitored while here for any adverse reaction.  Goal of Therapy:  Heparin level 0.3-0.7 units/ml Monitor platelets by anticoagulation protocol: Yes   Plan:  -Extensive discussion with Dr. Armandina Gemma, patient and his family regarding plan of care and risk vs benefit of heparin - agreeable to proceed -Heparin 2300 unit bolus -Start heparin infusion at 1350 units/hr -Check heparin level 6 hours after start of infusion -Monitor CBC daily, signs and symptoms of bleeding -RN aware of alpha gal allergy and will monitor patient closely with initiation of heparin -Potential for biopsy while admitted - will follow plan of care closely for appropriate hold of heparin  Tawnya Crook, PharmD, BCPS Clinical Pharmacist 01/10/2022 9:26 PM

## 2022-01-10 NOTE — ED Triage Notes (Signed)
Patient had a CT scan done earlier today at 5:30pm and was called back with results stating he had a confirmed blood clot in his lungs. His doctor stated to come to the ED urgently to get on a heparin drip. No shortness of breath, no chest pain. Takes plavix, but has not had it for the last 2 days. Has never had a blood clot in the past. Has renal cell carcinoma and peritoneal carcinoma stage 4. Getting biopsys on lung, liver and kidney.

## 2022-01-11 ENCOUNTER — Encounter (HOSPITAL_COMMUNITY): Payer: Self-pay | Admitting: Internal Medicine

## 2022-01-11 ENCOUNTER — Observation Stay (HOSPITAL_BASED_OUTPATIENT_CLINIC_OR_DEPARTMENT_OTHER): Payer: Medicare HMO

## 2022-01-11 ENCOUNTER — Other Ambulatory Visit (HOSPITAL_COMMUNITY): Payer: Self-pay

## 2022-01-11 DIAGNOSIS — I2699 Other pulmonary embolism without acute cor pulmonale: Secondary | ICD-10-CM

## 2022-01-11 DIAGNOSIS — Z951 Presence of aortocoronary bypass graft: Secondary | ICD-10-CM

## 2022-01-11 DIAGNOSIS — I25118 Atherosclerotic heart disease of native coronary artery with other forms of angina pectoris: Secondary | ICD-10-CM

## 2022-01-11 DIAGNOSIS — E119 Type 2 diabetes mellitus without complications: Secondary | ICD-10-CM | POA: Diagnosis not present

## 2022-01-11 LAB — TROPONIN I (HIGH SENSITIVITY)
Troponin I (High Sensitivity): 5 ng/L (ref <18)
Troponin I (High Sensitivity): 4 ng/L (ref <18)

## 2022-01-11 LAB — BRAIN NATRIURETIC PEPTIDE: B Natriuretic Peptide: 180.5 pg/mL — ABNORMAL HIGH (ref 0.0–100.0)

## 2022-01-11 LAB — CBC
HCT: 35.8 % — ABNORMAL LOW (ref 39.0–52.0)
Hemoglobin: 12.7 g/dL — ABNORMAL LOW (ref 13.0–17.0)
MCH: 31.3 pg (ref 26.0–34.0)
MCHC: 35.5 g/dL (ref 30.0–36.0)
MCV: 88.2 fL (ref 80.0–100.0)
Platelets: 147 10*3/uL — ABNORMAL LOW (ref 150–400)
RBC: 4.06 MIL/uL — ABNORMAL LOW (ref 4.22–5.81)
RDW: 11.8 % (ref 11.5–15.5)
WBC: 6.2 10*3/uL (ref 4.0–10.5)
nRBC: 0 % (ref 0.0–0.2)

## 2022-01-11 LAB — HEPARIN LEVEL (UNFRACTIONATED)
Heparin Unfractionated: 0.43 IU/mL (ref 0.30–0.70)
Heparin Unfractionated: 0.56 IU/mL (ref 0.30–0.70)

## 2022-01-11 LAB — GLUCOSE, CAPILLARY
Glucose-Capillary: 155 mg/dL — ABNORMAL HIGH (ref 70–99)
Glucose-Capillary: 154 mg/dL — ABNORMAL HIGH (ref 70–99)

## 2022-01-11 LAB — HIV ANTIBODY (ROUTINE TESTING W REFLEX): HIV Screen 4th Generation wRfx: NONREACTIVE

## 2022-01-11 MED ORDER — ACETAMINOPHEN 325 MG PO TABS: 650.0000 mg | ORAL_TABLET | Freq: Four times a day (QID) | ORAL | Status: DC | PRN

## 2022-01-11 MED ORDER — OXYBUTYNIN CHLORIDE 5 MG PO TABS
5.0000 mg | ORAL_TABLET | Freq: Every day | ORAL | Status: DC
  Administered 2022-01-11: 5 mg via ORAL
  Filled 2022-01-11: qty 1

## 2022-01-11 MED ORDER — INSULIN ASPART 100 UNIT/ML IJ SOLN
0.0000 [IU] | Freq: Three times a day (TID) | INTRAMUSCULAR | Status: DC
  Administered 2022-01-11: 1 [IU] via SUBCUTANEOUS
  Administered 2022-01-11: 2 [IU] via SUBCUTANEOUS
  Filled 2022-01-11: qty 0.09

## 2022-01-11 MED ORDER — ENOXAPARIN (LOVENOX) PATIENT EDUCATION KIT
PACK | Freq: Once | Status: AC
Start: 2022-01-11 — End: 2022-01-11
  Filled 2022-01-11: qty 1

## 2022-01-11 MED ORDER — CLOPIDOGREL BISULFATE 75 MG PO TABS: 75.0000 mg | ORAL_TABLET | Freq: Every day | ORAL | 3 refills | Status: DC

## 2022-01-11 MED ORDER — ACETAMINOPHEN 650 MG RE SUPP: 650.0000 mg | Freq: Four times a day (QID) | RECTAL | Status: DC | PRN

## 2022-01-11 MED ORDER — PANTOPRAZOLE SODIUM 40 MG PO TBEC
40.0000 mg | DELAYED_RELEASE_TABLET | Freq: Every day | ORAL | Status: DC
  Administered 2022-01-11: 40 mg via ORAL
  Filled 2022-01-11: qty 1

## 2022-01-11 MED ORDER — AMLODIPINE BESYLATE 5 MG PO TABS
10.0000 mg | ORAL_TABLET | Freq: Every day | ORAL | Status: DC
  Administered 2022-01-11: 10 mg via ORAL
  Filled 2022-01-11: qty 2

## 2022-01-11 MED ORDER — CARVEDILOL 12.5 MG PO TABS
12.5000 mg | ORAL_TABLET | Freq: Two times a day (BID) | ORAL | Status: DC
Start: 2022-01-11 — End: 2022-01-11
  Administered 2022-01-11: 12.5 mg via ORAL
  Filled 2022-01-11: qty 1

## 2022-01-11 MED ORDER — ENOXAPARIN SODIUM 120 MG/0.8ML IJ SOSY
120.0000 mg | PREFILLED_SYRINGE | INTRAMUSCULAR | 0 refills | Status: DC
  Filled 2022-01-11: qty 4, 5d supply, fill #0

## 2022-01-11 MED ORDER — TRAZODONE HCL 100 MG PO TABS: 50.0000 mg | ORAL_TABLET | Freq: Every day | ORAL | Status: DC

## 2022-01-11 MED ORDER — APIXABAN (ELIQUIS) VTE STARTER PACK (10MG AND 5MG)
ORAL_TABLET | ORAL | 0 refills | Status: DC
  Filled 2022-01-11: qty 74, 30d supply, fill #0

## 2022-01-11 MED ORDER — ENOXAPARIN SODIUM 120 MG/0.8ML IJ SOSY
120.0000 mg | PREFILLED_SYRINGE | INTRAMUSCULAR | 0 refills | Status: DC
  Filled 2022-01-11 (×2): qty 2.4, 3d supply, fill #0

## 2022-01-11 MED ORDER — POTASSIUM CHLORIDE CRYS ER 20 MEQ PO TBCR
20.0000 meq | EXTENDED_RELEASE_TABLET | Freq: Once | ORAL | Status: AC
  Administered 2022-01-11: 20 meq via ORAL
  Filled 2022-01-11: qty 1

## 2022-01-11 MED ORDER — ROSUVASTATIN CALCIUM 20 MG PO TABS
40.0000 mg | ORAL_TABLET | Freq: Every day | ORAL | Status: DC
  Administered 2022-01-11: 40 mg via ORAL
  Filled 2022-01-11: qty 2

## 2022-01-11 MED ORDER — TAMSULOSIN HCL 0.4 MG PO CAPS: 0.8000 mg | ORAL_CAPSULE | Freq: Every day | ORAL | Status: DC

## 2022-01-11 MED ORDER — ENOXAPARIN SODIUM 120 MG/0.8ML IJ SOSY
120.0000 mg | PREFILLED_SYRINGE | INTRAMUSCULAR | Status: DC
  Administered 2022-01-11: 120 mg via SUBCUTANEOUS
  Filled 2022-01-11: qty 0.8

## 2022-01-11 NOTE — H&P (Signed)
History and Physical    Daniel Gibson TKW:[id number] DOB: [birth year] DOA: 01/10/2022  PCP: Leonides Sake, MD  Patient coming from: Home.  Chief Complaint: CT scan showing pulm embolism.  HPI: Daniel Gibson is a 71 y.o. male with history of CAD status post CABG, hypertension, diabetes mellitus prior history of prostate cancer in remission who had recently experienced hematuria work-up showed renal mass and had followed with oncologist who had ordered a CT chest for further work-up which showed pulmonary embolism with no strain pattern was advised to come to the ER.  Patient denies any chest pain shortness of breath or any swelling in the lower extremity.  Patient was recently treated for UTI patient states that his hematuria is improved.  ED Course: In the ER patient remains hemodynamically stable was started on heparin after ER physician discussed with on-call oncologist Dr. Alvy Bimler.  Review of Systems: As per HPI, rest all negative.   Past Medical History:  Diagnosis Date   Carotid artery disease ([place]) 05/08/2012   CEA on left 2013   Coronary artery disease    Diabetes mellitus without complication ([place])    Food allergy    Alpha Gal allergy    Hyperlipidemia    Hypertensive heart disease without CHF 04/08/2018   Myocardial infarction ([place]) 1993   medically managed   Obesity (BMI 30-39.9) 04/08/2018   Prostate cancer ([place]) 08/07/2016   PVC's (premature ventricular contractions) 04/08/2018   Thrombocytopenia ([place])     Past Surgical History:  Procedure Laterality Date   APPENDECTOMY     CAROTID ENDARTERECTOMY Right 06-07-12   cea   CORONARY ARTERY BYPASS GRAFT N/A 10/18/2020   Procedure: CORONARY ARTERY BYPASS GRAFTING (CABG)x3. LEFT INTERNAL MAMMARY ARTERY. RIGHT ENDOSCOPIC SAPHENOUS VEIN HARVESTING.;  Surgeon: Wonda Olds, MD;  Location: [facility];  Service: Open Heart Surgery;  Laterality: N/A;   ENDARTERECTOMY  06/07/2012   Procedure: ENDARTERECTOMY CAROTID;   Surgeon: Angelia Mould, MD;  Location: [facility] OR;  Service: Vascular;  Laterality: Right;  Right Carotid Endarterectomy with Patch Angioplasty   LEFT HEART CATH AND CORONARY ANGIOGRAPHY N/A 10/07/2020   Procedure: LEFT HEART CATH AND CORONARY ANGIOGRAPHY;  Surgeon: Troy Sine, MD;  Location: [facility];  Service: Cardiovascular;  Laterality: N/A;   TEE WITHOUT CARDIOVERSION N/A 10/18/2020   Procedure: TRANSESOPHAGEAL ECHOCARDIOGRAM (TEE);  Surgeon: Wonda Olds, MD;  Location: [facility];  Service: Open Heart Surgery;  Laterality: N/A;   TRANSPERINEAL IMPLANT OF RADIATION SEEDS W/ ULTRASOUND       reports that he has never smoked. He has never used smokeless tobacco. He reports that he does not drink alcohol and does not use drugs.  Allergies  Allergen Reactions   Ace Inhibitors Swelling   Albumin (Human)     Potential interaction with Alpha-gal allergy   Beef-Derived Products Anaphylaxis    Alpha-Gal allergy   Gelfoam-Jmi Sponge     Alpha-gal allergy.   Heparin     Potential interaction with alpha-gal allergy   Pork-Derived Products Anaphylaxis    Alpha-Gal allergy   Sheep-Derived Products Anaphylaxis    Alpha-Gal allergy   Tetanus Toxoids Swelling    Family History  Problem Relation Age of Onset   Cancer Mother    Hyperlipidemia Mother    Hypertension Mother    Heart attack Father    Heart disease Father        Heart Disease before age 35   Hyperlipidemia Father    Hypertension Father  Diabetes Sister    Heart disease Sister        before age 18   Hypertension Sister    Hyperlipidemia Sister    Diabetes Brother    Heart disease Brother        before age 44   Hyperlipidemia Brother    Hypertension Brother     Prior to Admission medications   Medication Sig Start Date End Date Taking? Authorizing Provider  amLODipine (NORVASC) 10 MG tablet Take 10 mg by mouth in the morning.   Yes [provider]  carvedilol (COREG) 25 MG tablet Take 12.5  mg by mouth 2 (two) times daily with a meal.   Yes [provider]  cefTRIAXone (ROCEPHIN) 10 g injection Inject 1 g into the vein daily. 12/30/21  Yes [provider]  clopidogrel (PLAVIX) 75 MG tablet TAKE 1 TABLET BY MOUTH EVERY DAY Patient taking differently: Take 75 mg by mouth daily. 11/22/21  Yes Richardo Priest, MD  EPINEPHrine (EPIPEN 2-PAK) 0.3 mg/0.3 mL IJ SOAJ injection Use as directed for life-threatening allergic reaction. 05/10/20  Yes Kozlow, Donnamarie Poag, MD  ezetimibe (ZETIA) 10 MG tablet TAKE 1 TABLET BY MOUTH EVERY DAY Patient taking differently: Take 10 mg by mouth daily. 11/22/21  Yes Richardo Priest, MD  metFORMIN (GLUCOPHAGE) 1000 MG tablet Take 500 mg by mouth in the morning and at bedtime.   Yes [provider]  oxybutynin (DITROPAN) 5 MG tablet Take 5 mg by mouth. 12/30/21 12/30/22 Yes [provider]  pantoprazole (PROTONIX) 40 MG tablet TAKE 1 TABLET BY MOUTH EVERY DAY Patient taking differently: Take 40 mg by mouth daily. 11/14/21  Yes Richardo Priest, MD  Polyethylene Glycol 3350 (MIRALAX PO) Take 1 Package by mouth as needed (constipation).   Yes [provider]  rosuvastatin (CRESTOR) 40 MG tablet TAKE 1 TABLET BY MOUTH EVERY DAY Patient taking differently: Take 40 mg by mouth daily. 09/26/21  Yes Richardo Priest, MD  Tamsulosin HCl (FLOMAX) 0.4 MG CAPS Take 0.8 mg by mouth at bedtime.   Yes [provider]  traZODone (DESYREL) 50 MG tablet Take 50 mg by mouth at bedtime.   Yes [provider]    Physical Exam: Constitutional: Moderately built and nourished. Vitals:   01/11/22 0445 01/11/22 0500 01/11/22 0515 01/11/22 0545  BP: (!) 154/70 (!) 156/67 (!) 149/67 (!) 170/67  Pulse: (!) 56 (!) 57 61 (!) 34  Resp: '17 12 17 16  '$ Temp:      TempSrc:      SpO2: 92% 100% 90% 96%  Weight:      Height:       Eyes: Anicteric no pallor. ENMT: No discharge from the ears eyes nose and mouth. Neck: No mass felt.  No neck  rigidity. Respiratory: No rhonchi or crepitations. Cardiovascular: S1-S2 heard. Abdomen: Soft nontender bowel sound present. Musculoskeletal: No edema. Skin: No rash. Neurologic: Alert awake oriented time place and person.  Moves all extremities. Psychiatric: Appears normal.  Normal affect.   Labs on Admission: I have personally reviewed following labs and imaging studies  CBC: Recent Labs  Lab 01/04/22 1320 01/10/22 1944 01/11/22 0400  WBC 8.0 9.1 6.2  NEUTROABS 6.2 6.9  --   HGB 13.7 13.0 12.7*  HCT 38.4* 36.8* 35.8*  MCV 88.1 87.2 88.2  PLT 182 158 725*   Basic Metabolic Panel: Recent Labs  Lab 01/04/22 1320 01/10/22 1944  NA 141 139  K 3.8 3.3*  CL 108  108  CO2 27 25  GLUCOSE 128* 98  BUN 9 12  CREATININE 1.23 1.02  CALCIUM 9.3 8.8*   GFR: Estimated Creatinine Clearance: 69.6 mL/min (by C-G formula based on SCr of 1.02 mg/dL). Liver Function Tests: Recent Labs  Lab 01/04/22 1320  AST 12*  ALT 10  ALKPHOS 53  BILITOT 0.7  PROT 6.6  ALBUMIN 4.0   No results for input(s): "LIPASE", "AMYLASE" in the last 168 hours. No results for input(s): "AMMONIA" in the last 168 hours. Coagulation Profile: Recent Labs  Lab 01/10/22 2021  INR 1.6*   Cardiac Enzymes: No results for input(s): "CKTOTAL", "CKMB", "CKMBINDEX", "TROPONINI" in the last 168 hours. BNP (last 3 results) No results for input(s): "PROBNP" in the last 8760 hours. HbA1C: No results for input(s): "HGBA1C" in the last 72 hours. CBG: No results for input(s): "GLUCAP" in the last 168 hours. Lipid Profile: No results for input(s): "CHOL", "HDL", "LDLCALC", "TRIG", "CHOLHDL", "LDLDIRECT" in the last 72 hours. Thyroid Function Tests: No results for input(s): "TSH", "T4TOTAL", "FREET4", "T3FREE", "THYROIDAB" in the last 72 hours. Anemia Panel: No results for input(s): "VITAMINB12", "FOLATE", "FERRITIN", "TIBC", "IRON", "RETICCTPCT" in the last 72 hours. Urine analysis:    Component Value  Date/Time   COLORURINE YELLOW 10/15/2020 0858   APPEARANCEUR CLEAR 10/15/2020 0858   LABSPEC 1.013 10/15/2020 0858   PHURINE 6.0 10/15/2020 0858   GLUCOSEU >=500 (A) 10/15/2020 0858   HGBUR SMALL (A) 10/15/2020 0858   BILIRUBINUR NEGATIVE 10/15/2020 0858   KETONESUR NEGATIVE 10/15/2020 0858   PROTEINUR 30 (A) 10/15/2020 0858   UROBILINOGEN 1.0 05/31/2012 1345   NITRITE NEGATIVE 10/15/2020 0858   LEUKOCYTESUR NEGATIVE 10/15/2020 0858   Sepsis Labs: '@LABRCNTIP'$ (procalcitonin:4,lacticidven:4) )No results found for this or any previous visit (from the past 240 hour(s)).   Radiological Exams on Admission: CT Chest W Contrast  Result Date: 01/10/2022 CLINICAL DATA:  Renal mass, evaluate for metastatic disease EXAM: CT CHEST WITH CONTRAST TECHNIQUE: Multidetector CT imaging of the chest was performed during intravenous contrast administration. RADIATION DOSE REDUCTION: This exam was performed according to the departmental dose-optimization program which includes automated exposure control, adjustment of the mA and/or kV according to patient size and/or use of iterative reconstruction technique. CONTRAST:  42m OMNIPAQUE IOHEXOL 300 MG/ML  SOLN COMPARISON:  12/28/2021, 12/27/2021 FINDINGS: Cardiovascular: The heart is unremarkable without pericardial effusion. Diffuse coronary artery atherosclerosis. No evidence of thoracic aortic aneurysm or dissection. Atherosclerosis of the aortic arch and descending thoracic aorta. Incidental filling defects are seen within segmental and subsegmental branches of the right lower lobe pulmonary artery consistent with pulmonary emboli. Clot burden is minimal. No right heart strain. Mediastinum/Nodes: No enlarged mediastinal, hilar, or axillary lymph nodes. Thyroid gland, trachea, and esophagus demonstrate no significant findings. Lungs/Pleura: Minimal patchy ground-glass attenuation right upper lobe image 30 2 May be inflammatory or infectious. Multiple sub 3 mm  calcified granulomas are seen within the right lower lobe. No other pulmonary nodules or masses. No effusion or pneumothorax. Central airways are patent. Upper Abdomen: Partial visualization of a known upper pole right renal mass concerning for renal cell carcinoma. Enhancing area within the inferior right lobe liver on the 12/28/2021 exam, reference image 26/10, is not well visualized on this study due to timing of contrast bolus. Musculoskeletal: No acute or destructive bony lesions. Moderate midthoracic spondylosis. Reconstructed images demonstrate no additional findings. IMPRESSION: 1. Incidental right lower lobe segmental and subsegmental pulmonary emboli. Minimal clot burden without right heart strain. 2. No evidence of intrathoracic metastases.  3. Small focus of ground-glass airspace disease within the right upper lobe likely inflammatory or infectious. 4. Partial visualization of known right renal mass highly concerning for renal cell carcinoma. Indeterminate right lobe liver lesion seen on prior abdominal CT not well visualized on this exam due to timing of contrast bolus. 5. Aortic Atherosclerosis (ICD10-I70.0). Coronary artery atherosclerosis. Critical Value/emergent results were called by telephone at the time of interpretation on 01/10/2022 at 5:44 pm to provider [facility], who verbally acknowledged these results. Electronically Signed   By: Randa Ngo M.D.   On: 01/10/2022 17:51    EKG: Independently reviewed.  Normal sinus rhythm.  Assessment/Plan Principal Problem:   Pulmonary embolism (HCC) Active Problems:   CAD (coronary artery disease), native coronary artery   Diabetes mellitus type 2, noninsulin dependent (HCC)   S/P CABG x 3    Pulmonary embolism -likely unprovoked in the setting of possible malignancy.  Patient started on heparin.  Patient is likely to have a biopsy done for his renal mass next week.  We will need to discuss with patient's oncologist if this needs to be done  while patient inpatient.  We will check Dopplers of the lower extremity trend cardiac markers check BNP. Renal cell mass with possible metastasis recently seen in the CAT scan.  Need to discuss with oncologist if patient's biopsy needs to be done while inpatient. Hypertension on amlodipine and Coreg. CAD status post CABG Plavix on hold in anticipation of biopsy since this week.  Continue statins Coreg. Diabetes mellitus type 2 we will keep patient on sliding scale coverage while inpatient.  Takes metformin at home. Anemia follow CBC.  Recently had hematuria. History of prostate cancer in remission.   DVT prophylaxis: Heparin infusion. Code Status: Full code. Family Communication: Discussed with patient. Disposition Plan: Home. Consults called: ER physician discussed with oncologist. Admission status: Observation.   Rise Patience MD [facility] Pager [phone].  If 7PM-7AM, please contact night-coverage [email or web address] Password [email or web address]  01/11/2022, 6:41 AM

## 2022-01-11 NOTE — Progress Notes (Signed)
Patient admitted to the hospital earlier this morning by Dr. Hal Hope  Patient seen and examined.  He is sitting up in bed.  He denies any complaints.  No shortness of breath or chest pain.   Patient has been following with oncology for work-up of renal mass with possible peritoneal carcinomatosis.  Plans were to have biopsy performed on omental mass on 7/24.  He had a CT chest ordered for malignancy work-up which noted to have incidental pulmonary embolus.  He was sent to the emergency room and started on IV heparin.  Case was reviewed by EDP with on-call oncologist.  Oncology recommended that patient have inpatient biopsy performed since he is newly started on anticoagulation and started him on a DOAC may interfere with upcoming outpatient biopsy with regards to ecchymosis and management of his malignancy.  IR consultation has been requested.  [employer]

## 2022-01-11 NOTE — Progress Notes (Signed)
ANTICOAGULATION CONSULT NOTE - Follow Up Consult  Pharmacy Consult for heparin Indication: pulmonary embolus  Allergies  Allergen Reactions   Ace Inhibitors Swelling   Albumin (Human)     Potential interaction with Alpha-gal allergy   Beef-Derived Products Anaphylaxis    Alpha-Gal allergy   Gelfoam-Jmi Sponge     Alpha-gal allergy.   Heparin     Potential interaction with alpha-gal allergy   Pork-Derived Products Anaphylaxis    Alpha-Gal allergy   Sheep-Derived Products Anaphylaxis    Alpha-Gal allergy   Tetanus Toxoids Swelling    Patient Measurements: Height: '5\' 10"'$  (177.8 cm) Weight: 81.6 kg (180 lb) IBW/kg (Calculated) : 73 Heparin Dosing Weight: 81.6 kg  Vital Signs: Temp: 98.3 F (36.8 C) (07/19 0711) Temp Source: Oral (07/19 0711) BP: 126/72 (07/19 0930) Pulse Rate: 60 (07/19 0930)  Labs: Recent Labs    01/10/22 1944 01/10/22 2021 01/11/22 0400 01/11/22 0645 01/11/22 0839 01/11/22 1000  HGB 13.0  --  12.7*  --   --   --   HCT 36.8*  --  35.8*  --   --   --   PLT 158  --  147*  --   --   --   APTT  --  20*  --   --   --   --   LABPROT  --  18.7*  --   --   --   --   INR  --  1.6*  --   --   --   --   HEPARINUNFRC  --   --  0.43  --   --  0.56  CREATININE 1.02  --   --   --   --   --   TROPONINIHS 4  --   --  5 4  --      Estimated Creatinine Clearance: 69.6 mL/min (by C-G formula based on SCr of 1.02 mg/dL).  Assessment: 71 year old male presented to the ED by recommendation from oncology for finding of PE on outpatient CT scan. CT positive incidental right lower lobe segmental and subsegmental pulmonary emboli, minimal clot burden without right heart strain. Patient is not on anticoagulation PTA, but does take Plavix which he started holding today in anticipation of a biopsy to be done 7/24. Pharmacy consulted to manage heparin infusion.  Of note, patient with alpha gal allergy - wife states he tolerates pork, cannot eat beef. It appears he  received heparin during CABG in April 2022 with no documented issues. Regardless, risk discussed with patient and he provided verbal consent to proceed with heparin with understanding of risk and that he would be closely monitored while here for any adverse reaction.  01/11/22 Confirmatory Heparin level = 0.56 (therapeutic) with heparin gtt @ 1350 units/hr Hgb = 12.7, Pltc 528U No complications of therapy noted  Goal of Therapy:  Heparin level 0.3-0.7 units/ml Monitor platelets by anticoagulation protocol: Yes   Plan:  -Continue heparin infusion at 1350 units/hr -Monitor CBC & heparin level daily, signs and symptoms of bleeding -Potential for biopsy while admitted - will follow plan of care closely for appropriate hold of heparin  Eudelia Bunch, Pharm.D 01/11/2022 10:53 AM

## 2022-01-11 NOTE — Progress Notes (Signed)
ANTICOAGULATION CONSULT NOTE - Follow Up Consult  Pharmacy Consult for heparin>> LMWH Indication: pulmonary embolus  Allergies  Allergen Reactions   Ace Inhibitors Swelling   Albumin (Human)     Potential interaction with Alpha-gal allergy   Beef-Derived Products Anaphylaxis    Alpha-Gal allergy   Gelfoam-Jmi Sponge     Alpha-gal allergy.   Heparin     Potential interaction with alpha-gal allergy   Pork-Derived Products Anaphylaxis    Alpha-Gal allergy   Sheep-Derived Products Anaphylaxis    Alpha-Gal allergy   Tetanus Toxoids Swelling    Patient Measurements: Height: '5\' 10"'$  (177.8 cm) Weight: 81.6 kg (180 lb) IBW/kg (Calculated) : 73 Heparin Dosing Weight: 81.6 kg  Vital Signs: Temp: 98.3 F (36.8 C) (07/19 0711) Temp Source: Oral (07/19 0711) BP: 134/62 (07/19 1200) Pulse Rate: 64 (07/19 1215)  Labs: Recent Labs    01/10/22 1944 01/10/22 2021 01/11/22 0400 01/11/22 0645 01/11/22 0839 01/11/22 1000  HGB 13.0  --  12.7*  --   --   --   HCT 36.8*  --  35.8*  --   --   --   PLT 158  --  147*  --   --   --   APTT  --  20*  --   --   --   --   LABPROT  --  18.7*  --   --   --   --   INR  --  1.6*  --   --   --   --   HEPARINUNFRC  --   --  0.43  --   --  0.56  CREATININE 1.02  --   --   --   --   --   TROPONINIHS 4  --   --  5 4  --      Estimated Creatinine Clearance: 69.6 mL/min (by C-G formula based on SCr of 1.02 mg/dL).  Assessment: 71 year old male presented to the ED by recommendation from oncology for finding of PE on outpatient CT scan. CT positive incidental right lower lobe segmental and subsegmental pulmonary emboli, minimal clot burden without right heart strain. Patient is not on anticoagulation PTA, but does take Plavix which he started holding today in anticipation of a biopsy to be done 7/24. Pharmacy consulted to manage heparin infusion.  Of note, patient with alpha gal allergy - wife states he tolerates pork, cannot eat beef. It appears he  received heparin during CABG in April 2022 with no documented issues. Regardless, risk discussed with patient and he provided verbal consent to proceed with heparin with understanding of risk and that he would be closely monitored while here for any adverse reaction.  01/11/22 Confirmatory Heparin level = 0.56 (therapeutic) with heparin gtt @ 1350 units/hr Hgb = 12.7, Pltc 762U No complications of therapy noted To change heparin to enoxaparin and DC home for biopsy to be done outpatient 7/24 D/w Dr Roderic Palau once daily dosing requested d/t more convenient  Goal of Therapy:  Heparin level 0.3-0.7 units/ml Monitor platelets by anticoagulation protocol: Yes   Plan:  DC heparin drip Enoxaparin 1.5 mg/kg q24 = 120 mg sq q24 RN to educate patient   Eudelia Bunch, Pharm.D 01/11/2022 12:53 PM

## 2022-01-11 NOTE — ED Notes (Signed)
Patient continues to have periods of apnea and oxygen de-saturation while sleeping.  Patient will wake to voice and recover.  MD Hal Hope made aware.

## 2022-01-11 NOTE — Progress Notes (Signed)
Pharmacy 6 day supply of Lovenox 120 mg provided to patient from [place] starter pack provided to patient from [place].  Educated patient and wife about lovenox & Eliquis extensively and all questions answered  Eudelia Bunch, Pharm.D 01/11/2022 3:32 PM

## 2022-01-11 NOTE — Progress Notes (Signed)
ANTICOAGULATION CONSULT NOTE - Follow Up Consult  Pharmacy Consult for heparin Indication: pulmonary embolus  Allergies  Allergen Reactions   Ace Inhibitors Swelling   Albumin (Human)     Potential interaction with Alpha-gal allergy   Beef-Derived Products Anaphylaxis    Alpha-Gal allergy   Gelfoam-Jmi Sponge     Alpha-gal allergy.   Heparin     Potential interaction with alpha-gal allergy   Pork-Derived Products Anaphylaxis    Alpha-Gal allergy   Sheep-Derived Products Anaphylaxis    Alpha-Gal allergy   Tetanus Toxoids Swelling    Patient Measurements: Height: '5\' 10"'$  (177.8 cm) Weight: 81.6 kg (180 lb) IBW/kg (Calculated) : 73 Heparin Dosing Weight: 81.6 kg  Vital Signs: Temp: 98.2 F (36.8 C) (07/19 0024) Temp Source: Oral (07/19 0024) BP: 164/73 (07/19 0130) Pulse Rate: 61 (07/19 0300)  Labs: Recent Labs    01/10/22 1944 01/10/22 2021 01/11/22 0400  HGB 13.0  --  12.7*  HCT 36.8*  --  35.8*  PLT 158  --  147*  APTT  --  20*  --   LABPROT  --  18.7*  --   INR  --  1.6*  --   HEPARINUNFRC  --   --  0.43  CREATININE 1.02  --   --   TROPONINIHS 4  --   --      Estimated Creatinine Clearance: 69.6 mL/min (by C-G formula based on SCr of 1.02 mg/dL).   Medical History: Past Medical History:  Diagnosis Date   Carotid artery disease ([place]) 05/08/2012   CEA on left 2013   Coronary artery disease    Diabetes mellitus without complication ([place])    Food allergy    Alpha Gal allergy    Hyperlipidemia    Hypertensive heart disease without CHF 04/08/2018   Myocardial infarction ([place]) 1993   medically managed   Obesity (BMI 30-39.9) 04/08/2018   Prostate cancer ([place]) 08/07/2016   PVC's (premature ventricular contractions) 04/08/2018   Thrombocytopenia [facility])       Assessment: 71 year old male presented to the ED by recommendation from oncology for finding of PE on outpatient CT scan. CT positive incidental right lower lobe segmental and subsegmental  pulmonary emboli, minimal clot burden without right heart strain. Patient is not on anticoagulation PTA, but does take Plavix which he started holding today in anticipation of a biopsy to be done 7/24. Pharmacy consulted to manage heparin infusion.  Of note, patient with alpha gal allergy - wife states he tolerates pork, cannot eat beef. It appears he received heparin during CABG in April 2022 with no documented issues. Regardless, risk discussed with patient and he provided verbal consent to proceed with heparin with understanding of risk and that he would be closely monitored while here for any adverse reaction.  01/11/22 Heparin level = 0.43 (therapeutic) with heparin gtt @ 1350 units/hr Hgb = 12.7, Pltc 024O No complications of therapy noted  Goal of Therapy:  Heparin level 0.3-0.7 units/ml Monitor platelets by anticoagulation protocol: Yes   Plan:  -Continue heparin infusion at 1350 units/hr -Recheck heparin level in 6 hours to confirm therapeutic dose  -Monitor CBC daily, signs and symptoms of bleeding -Potential for biopsy while admitted - will follow plan of care closely for appropriate hold of heparin  Everette Rank, PharmD  01/11/2022 4:42 AM

## 2022-01-11 NOTE — Progress Notes (Signed)
Bilateral lower extremity venous duplex completed. Refer to "CV Proc" under chart review to view preliminary results.  01/11/2022 9:21 AM Kelby Aline., MHA, RVT, RDCS, RDMS

## 2022-01-11 NOTE — ED Notes (Signed)
Pt blood sugar is 155.

## 2022-01-11 NOTE — Discharge Summary (Signed)
Physician Discharge Summary  Daniel Gibson ZMO:[id number] DOB: 20-Jun-1951 DOA: 01/10/2022  PCP: Leonides Sake, MD  Admit date: 01/10/2022 Discharge date: 01/11/2022  Admitted From: Home Disposition: Home  Recommendations for Outpatient Follow-up:  Patient will discharge on daily Lovenox for management of PE She will have omental biopsy performed on 7/26 He has been advised to hold his Lovenox on day of procedure After procedure, once he is cleared by interventional radiology, he should start on Eliquis starter pack He will follow-up with oncology to discuss biopsy results  Discharge Condition: Stable CODE STATUS: Full code Diet recommendation: Heart healthy  Brief/Interim Summary: 71 year old male with previous history of prostate cancer, was noted to have renal mass on CT imaging.  He was also noted to have possible peritoneal carcinomatosis.  He was seen in the oncology clinic for further work-up and arrangements have been made for patient to have omental biopsy performed.  To complete work-up, CT chest was also ordered that revealed incidental pulmonary embolus.  Patient was advised to come to emergency room for evaluation.  He was started on intravenous heparin.  Case was reviewed with oncology as well as interventional radiology.  It was recommended that omental biopsy be performed while he is an inpatient since he is on anticoagulation.  Unfortunately, schedule did not permit this to be done in a timely manner.  The soonest he could be worked into the schedule was on 7/26.  After reviewing with oncology Dr. Lorenso Courier, anticoagulation was changed to daily Lovenox injections.  The patient will continue these injections at home, but hold his injection on the day of procedure.  His biopsy has been scheduled for 7/26.  After his procedure, once cleared by oncology, he will start on apixaban starter pack.  Venous Dopplers were checked in the hospital and were noted to be negative for DVT in  lower extremities.  The patient will follow-up with oncology to discuss biopsy results.  He is otherwise feeling well, does not have any shortness of breath and feels as though he can manage at home.  He was educated on administration of Lovenox prior to discharge and pharmacy also educated him on Lovenox and apixaban.  Discharge Diagnoses:  Principal Problem:   Pulmonary embolism (HCC) Active Problems:   CAD (coronary artery disease), native coronary artery   Diabetes mellitus type 2, noninsulin dependent (HCC)   S/P CABG x 3    Discharge Instructions  Discharge Instructions     Diet - low sodium heart healthy   Complete by: As directed    Increase activity slowly   Complete by: As directed       Allergies as of 01/11/2022       Reactions   Ace Inhibitors Swelling   Albumin (human)    Potential interaction with Alpha-gal allergy   Beef-derived Products Anaphylaxis   Alpha-Gal allergy   Gelfoam-jmi Sponge    Alpha-gal allergy.   Heparin    Potential interaction with alpha-gal allergy   Pork-derived Products Anaphylaxis   Alpha-Gal allergy   Sheep-derived Products Anaphylaxis   Alpha-Gal allergy   Tetanus Toxoids Swelling        Medication List     STOP taking these medications    cefTRIAXone 10 g injection Commonly known as: ROCEPHIN       TAKE these medications    amLODipine 10 MG tablet Commonly known as: NORVASC Take 10 mg by mouth in the morning.   carvedilol 25 MG tablet Commonly known as: COREG Take  12.5 mg by mouth 2 (two) times daily with a meal.   clopidogrel 75 MG tablet Commonly known as: PLAVIX Take 1 tablet (75 mg total) by mouth daily. Resume after biopsy What changed: additional instructions   Eliquis DVT/PE Starter Pack Generic drug: Apixaban Starter Pack ('10mg'$  and '5mg'$ ) Take as directed on package: start with two-'5mg'$  tablets twice daily for 7 days. On day 8, switch to one-'5mg'$  tablet twice daily. Start taking after cleared by  interventional radiologist   enoxaparin 120 MG/0.8ML injection Commonly known as: LOVENOX Inject 0.8 mLs (120 mg total) into the skin daily for 6 days. Take last dose on 7/25, before 9am Start taking on: January 12, 2022   EPINEPHrine 0.3 mg/0.3 mL Soaj injection Commonly known as: EpiPen 2-Pak Use as directed for life-threatening allergic reaction.   ezetimibe 10 MG tablet Commonly known as: ZETIA TAKE 1 TABLET BY MOUTH EVERY DAY   metFORMIN 1000 MG tablet Commonly known as: GLUCOPHAGE Take 500 mg by mouth in the morning and at bedtime.   MIRALAX PO Take 1 Package by mouth as needed (constipation).   oxybutynin 5 MG tablet Commonly known as: DITROPAN Take 5 mg by mouth.   pantoprazole 40 MG tablet Commonly known as: PROTONIX TAKE 1 TABLET BY MOUTH EVERY DAY   rosuvastatin 40 MG tablet Commonly known as: CRESTOR TAKE 1 TABLET BY MOUTH EVERY DAY   tamsulosin 0.4 MG Caps capsule Commonly known as: FLOMAX Take 0.8 mg by mouth at bedtime.   traZODone 50 MG tablet Commonly known as: DESYREL Take 50 mg by mouth at bedtime.        Follow-up Information     Orson Slick, MD Follow up.   Specialty: Hematology and Oncology Why: call for appointment in 2 weeks Contact information: [address] [place] [address] [phone]         [facility] Follow up on 01/18/2022.   Specialty: Radiology Why: 9:00am do not take any plavix until after procedure do not take any eliquis until after procedure take the lovenox injections daily prior to procedure. Last dose of lovenox injection should be 7/25 before 9:00am Contact information: [phone] [place] [id number] [phone]               Allergies  Allergen Reactions   Ace Inhibitors Swelling   Albumin (Human)     Potential interaction with Alpha-gal allergy   Beef-Derived Products Anaphylaxis    Alpha-Gal  allergy   Gelfoam-Jmi Sponge     Alpha-gal allergy.   Heparin     Potential interaction with alpha-gal allergy   Pork-Derived Products Anaphylaxis    Alpha-Gal allergy   Sheep-Derived Products Anaphylaxis    Alpha-Gal allergy   Tetanus Toxoids Swelling    Consultations:    Procedures/Studies: VAS [place] LOWER EXTREMITY VENOUS (DVT)  Result Date: 01/11/2022  Lower Venous DVT Study Patient Name:  Daniel Gibson  Date of Exam:   01/11/2022 Medical Rec #: [id number]          Accession #:    [id number] Date of Birth: Sep 22, 1950           Patient Gender: M Patient Age:   71 years Exam Location:  [facility] Procedure:      VAS [place] LOWER EXTREMITY VENOUS (DVT) Referring Phys: Gean Birchwood --------------------------------------------------------------------------------  Indications: Pulmonary embolism.  Comparison Study: No prior study Performing Technologist: Maudry Mayhew MHA, RDMS, RVT, RDCS  Examination Guidelines:  A complete evaluation includes B-mode imaging, spectral Doppler, color Doppler, and power Doppler as needed of all accessible portions of each vessel. Bilateral testing is considered an integral part of a complete examination. Limited examinations for reoccurring indications may be performed as noted. The reflux portion of the exam is performed with the patient in reverse Trendelenburg.  +---------+---------------+---------+-----------+----------+--------------+ RIGHT    CompressibilityPhasicitySpontaneityPropertiesThrombus Aging +---------+---------------+---------+-----------+----------+--------------+ CFV      Full           Yes      Yes                                 +---------+---------------+---------+-----------+----------+--------------+ SFJ      Full                                                        +---------+---------------+---------+-----------+----------+--------------+ FV Prox  Full                                                         +---------+---------------+---------+-----------+----------+--------------+ FV Mid   Full                                                        +---------+---------------+---------+-----------+----------+--------------+ FV DistalFull                                                        +---------+---------------+---------+-----------+----------+--------------+ PFV      Full                                                        +---------+---------------+---------+-----------+----------+--------------+ POP      Full           Yes      Yes                                 +---------+---------------+---------+-----------+----------+--------------+ PTV      Full                                                        +---------+---------------+---------+-----------+----------+--------------+ PERO     Full                                                        +---------+---------------+---------+-----------+----------+--------------+   +---------+---------------+---------+-----------+----------+--------------+  LEFT     CompressibilityPhasicitySpontaneityPropertiesThrombus Aging +---------+---------------+---------+-----------+----------+--------------+ CFV      Full           Yes      Yes                                 +---------+---------------+---------+-----------+----------+--------------+ SFJ      Full                                                        +---------+---------------+---------+-----------+----------+--------------+ FV Prox  Full                                                        +---------+---------------+---------+-----------+----------+--------------+ FV Mid   Full                                                        +---------+---------------+---------+-----------+----------+--------------+ FV DistalFull                                                         +---------+---------------+---------+-----------+----------+--------------+ PFV      Full                                                        +---------+---------------+---------+-----------+----------+--------------+ POP      Full           Yes      Yes                                 +---------+---------------+---------+-----------+----------+--------------+ PTV      Full                                                        +---------+---------------+---------+-----------+----------+--------------+ PERO     Full                                                        +---------+---------------+---------+-----------+----------+--------------+     Summary: RIGHT: - There is no evidence of deep vein thrombosis in the lower extremity.  - No cystic structure found in the popliteal fossa.  LEFT: - There is no evidence of deep vein thrombosis in the lower extremity.  - No  cystic structure found in the popliteal fossa.  *See table(s) above for measurements and observations. Electronically signed by Monica Martinez MD on 01/11/2022 at 1:02:29 PM.    Final    CT Chest W Contrast  Result Date: 01/10/2022 CLINICAL DATA:  Renal mass, evaluate for metastatic disease EXAM: CT CHEST WITH CONTRAST TECHNIQUE: Multidetector CT imaging of the chest was performed during intravenous contrast administration. RADIATION DOSE REDUCTION: This exam was performed according to the departmental dose-optimization program which includes automated exposure control, adjustment of the mA and/or kV according to patient size and/or use of iterative reconstruction technique. CONTRAST:  44m OMNIPAQUE IOHEXOL 300 MG/ML  SOLN COMPARISON:  12/28/2021, 12/27/2021 FINDINGS: Cardiovascular: The heart is unremarkable without pericardial effusion. Diffuse coronary artery atherosclerosis. No evidence of thoracic aortic aneurysm or dissection. Atherosclerosis of the aortic arch and descending thoracic aorta. Incidental  filling defects are seen within segmental and subsegmental branches of the right lower lobe pulmonary artery consistent with pulmonary emboli. Clot burden is minimal. No right heart strain. Mediastinum/Nodes: No enlarged mediastinal, hilar, or axillary lymph nodes. Thyroid gland, trachea, and esophagus demonstrate no significant findings. Lungs/Pleura: Minimal patchy ground-glass attenuation right upper lobe image 30 2 May be inflammatory or infectious. Multiple sub 3 mm calcified granulomas are seen within the right lower lobe. No other pulmonary nodules or masses. No effusion or pneumothorax. Central airways are patent. Upper Abdomen: Partial visualization of a known upper pole right renal mass concerning for renal cell carcinoma. Enhancing area within the inferior right lobe liver on the 12/28/2021 exam, reference image 26/10, is not well visualized on this study due to timing of contrast bolus. Musculoskeletal: No acute or destructive bony lesions. Moderate midthoracic spondylosis. Reconstructed images demonstrate no additional findings. IMPRESSION: 1. Incidental right lower lobe segmental and subsegmental pulmonary emboli. Minimal clot burden without right heart strain. 2. No evidence of intrathoracic metastases. 3. Small focus of ground-glass airspace disease within the right upper lobe likely inflammatory or infectious. 4. Partial visualization of known right renal mass highly concerning for renal cell carcinoma. Indeterminate right lobe liver lesion seen on prior abdominal CT not well visualized on this exam due to timing of contrast bolus. 5. Aortic Atherosclerosis (ICD10-I70.0). Coronary artery atherosclerosis. Critical Value/emergent results were called by telephone at the time of interpretation on 01/10/2022 at 5:44 pm to [facility] who verbally acknowledged these results. Electronically Signed   By: MRanda NgoM.D.   On: 01/10/2022 17:51      Subjective: Denies any chest pain,  shortness of breath.  Resting comfortably in bed  Discharge Exam: Vitals:   01/11/22 1400 01/11/22 1410 01/11/22 1500 01/11/22 1600  BP: 129/62 129/62 140/70 139/67  Pulse: 60 61 (!) 57 (!) 57  Resp: 18 16 (!) 25 18  Temp:  98.4 F (36.9 C)    TempSrc:  Oral    SpO2: 94% 94% 94% 95%  Weight:      Height:        General: Pt is alert, awake, not in acute distress Cardiovascular: RRR, S1/S2 +, no rubs, no gallops Respiratory: CTA bilaterally, no wheezing, no rhonchi Abdominal: Soft, NT, ND, bowel sounds + Extremities: no edema, no cyanosis    The results of significant diagnostics from this hospitalization (including imaging, microbiology, ancillary and laboratory) are listed below for reference.     Microbiology: No results found for this or any previous visit (from the past 240 hour(s)).   Labs: BNP (last 3 results) Recent Labs    01/11/22  0645  BNP 170.0*   Basic Metabolic Panel: Recent Labs  Lab 01/10/22 1944  NA 139  K 3.3*  CL 108  CO2 25  GLUCOSE 98  BUN 12  CREATININE 1.02  CALCIUM 8.8*   Liver Function Tests: No results for input(s): "AST", "ALT", "ALKPHOS", "BILITOT", "PROT", "ALBUMIN" in the last 168 hours. No results for input(s): "LIPASE", "AMYLASE" in the last 168 hours. No results for input(s): "AMMONIA" in the last 168 hours. CBC: Recent Labs  Lab 01/10/22 1944 01/11/22 0400  WBC 9.1 6.2  NEUTROABS 6.9  --   HGB 13.0 12.7*  HCT 36.8* 35.8*  MCV 87.2 88.2  PLT 158 147*   Cardiac Enzymes: No results for input(s): "CKTOTAL", "CKMB", "CKMBINDEX", "TROPONINI" in the last 168 hours. BNP: Invalid input(s): "POCBNP" CBG: Recent Labs  Lab 01/11/22 0731 01/11/22 1206  GLUCAP 155* 154*   D-Dimer No results for input(s): "DDIMER" in the last 72 hours. Hgb A1c No results for input(s): "HGBA1C" in the last 72 hours. Lipid Profile No results for input(s): "CHOL", "HDL", "LDLCALC", "TRIG", "CHOLHDL", "LDLDIRECT" in the last 72  hours. Thyroid function studies No results for input(s): "TSH", "T4TOTAL", "T3FREE", "THYROIDAB" in the last 72 hours.  Invalid input(s): "FREET3" Anemia work up No results for input(s): "VITAMINB12", "FOLATE", "FERRITIN", "TIBC", "IRON", "RETICCTPCT" in the last 72 hours. Urinalysis    Component Value Date/Time   COLORURINE YELLOW 10/15/2020 0858   APPEARANCEUR CLEAR 10/15/2020 0858   LABSPEC 1.013 10/15/2020 0858   PHURINE 6.0 10/15/2020 0858   GLUCOSEU >=500 (A) 10/15/2020 0858   HGBUR SMALL (A) 10/15/2020 0858   BILIRUBINUR NEGATIVE 10/15/2020 0858   KETONESUR NEGATIVE 10/15/2020 0858   PROTEINUR 30 (A) 10/15/2020 0858   UROBILINOGEN 1.0 05/31/2012 1345   NITRITE NEGATIVE 10/15/2020 0858   LEUKOCYTESUR NEGATIVE 10/15/2020 0858   Sepsis Labs Recent Labs  Lab 01/10/22 1944 01/11/22 0400  WBC 9.1 6.2   Microbiology No results found for this or any previous visit (from the past 240 hour(s)).   Time coordinating discharge: 87mns  SIGNED:   JKathie Dike MD  [facility] 01/11/2022, 8:57 PM   If 7PM-7AM, please contact night-coverage [email or web address]

## 2022-01-11 NOTE — ED Notes (Signed)
Pt data did not transfer from glucometer / CBG '154mg'$ 

## 2022-01-16 ENCOUNTER — Ambulatory Visit
Admission: RE | Admit: 2022-01-16 | Discharge: 2022-01-16 | Disposition: A | Discharge status: Home / Self Care | Payer: Medicare HMO | Source: Ambulatory Visit | Attending: Physician Assistant | Admitting: Physician Assistant

## 2022-01-17 ENCOUNTER — Ambulatory Visit: Payer: Medicare HMO | Admitting: Cardiology

## 2022-01-17 ENCOUNTER — Other Ambulatory Visit: Payer: Self-pay | Admitting: Radiology

## 2022-01-17 DIAGNOSIS — K668 Other specified disorders of peritoneum: Secondary | ICD-10-CM

## 2022-01-18 ENCOUNTER — Encounter (HOSPITAL_COMMUNITY): Payer: Self-pay

## 2022-01-18 ENCOUNTER — Other Ambulatory Visit: Payer: Self-pay

## 2022-01-18 ENCOUNTER — Ambulatory Visit (HOSPITAL_COMMUNITY)
Admit: 2022-01-18 | Discharge: 2022-01-18 | Disposition: A | Discharge status: Home / Self Care | Payer: Medicare HMO | Attending: Physician Assistant | Admitting: Physician Assistant

## 2022-01-18 DIAGNOSIS — D3A8 Other benign neuroendocrine tumors: Secondary | ICD-10-CM | POA: Diagnosis not present

## 2022-01-18 DIAGNOSIS — K669 Disorder of peritoneum, unspecified: Secondary | ICD-10-CM

## 2022-01-18 DIAGNOSIS — K668 Other specified disorders of peritoneum: Secondary | ICD-10-CM

## 2022-01-18 DIAGNOSIS — N2889 Other specified disorders of kidney and ureter: Secondary | ICD-10-CM | POA: Diagnosis not present

## 2022-01-18 DIAGNOSIS — K7689 Other specified diseases of liver: Secondary | ICD-10-CM | POA: Diagnosis not present

## 2022-01-18 LAB — BASIC METABOLIC PANEL
Anion gap: 5 (ref 5–15)
BUN: 15 mg/dL (ref 8–23)
CO2: 25 mmol/L (ref 22–32)
Calcium: 9.1 mg/dL (ref 8.9–10.3)
Chloride: 111 mmol/L (ref 98–111)
Creatinine, Ser: 1.11 mg/dL (ref 0.61–1.24)
GFR, Estimated: >60 mL/min (ref >60)
Glucose, Bld: 171 mg/dL — ABNORMAL HIGH (ref 70–99)
Potassium: 4.3 mmol/L (ref 3.5–5.1)
Sodium: 141 mmol/L (ref 135–145)

## 2022-01-18 LAB — CBC WITH DIFFERENTIAL/PLATELET
Abs Immature Granulocytes: 0.03 10*3/uL (ref 0.00–0.07)
Basophils Absolute: 0.1 10*3/uL (ref 0.0–0.1)
Basophils Relative: 1 %
Eosinophils Absolute: 0.4 10*3/uL (ref 0.0–0.5)
Eosinophils Relative: 6 %
HCT: 37.3 % — ABNORMAL LOW (ref 39.0–52.0)
Hemoglobin: 13 g/dL (ref 13.0–17.0)
Immature Granulocytes: 0 %
Lymphocytes Relative: 6 %
Lymphs Abs: 0.4 10*3/uL — ABNORMAL LOW (ref 0.7–4.0)
MCH: 31.1 pg (ref 26.0–34.0)
MCHC: 34.9 g/dL (ref 30.0–36.0)
MCV: 89.2 fL (ref 80.0–100.0)
Monocytes Absolute: 0.7 10*3/uL (ref 0.1–1.0)
Monocytes Relative: 10 %
Neutro Abs: 5.3 10*3/uL (ref 1.7–7.7)
Neutrophils Relative %: 77 %
Platelets: 126 10*3/uL — ABNORMAL LOW (ref 150–400)
RBC: 4.18 MIL/uL — ABNORMAL LOW (ref 4.22–5.81)
RDW: 12.2 % (ref 11.5–15.5)
WBC: 6.9 10*3/uL (ref 4.0–10.5)
nRBC: 0 % (ref 0.0–0.2)

## 2022-01-18 LAB — PROTIME-INR
INR: 1.1 (ref 0.8–1.2)
Prothrombin Time: 13.6 seconds (ref 11.4–15.2)

## 2022-01-18 LAB — GLUCOSE, CAPILLARY: Glucose-Capillary: 160 mg/dL — ABNORMAL HIGH (ref 70–99)

## 2022-01-18 MED ORDER — MIDAZOLAM HCL 2 MG/2ML IJ SOLN
INTRAMUSCULAR | Status: AC
  Filled 2022-01-18: qty 4

## 2022-01-18 MED ORDER — NALOXONE HCL 0.4 MG/ML IJ SOLN
INTRAMUSCULAR | Status: AC
  Filled 2022-01-18: qty 1

## 2022-01-18 MED ORDER — MIDAZOLAM HCL 2 MG/2ML IJ SOLN
INTRAMUSCULAR | Status: AC | PRN
  Administered 2022-01-18: .5 mg via INTRAVENOUS
  Administered 2022-01-18: 1 mg via INTRAVENOUS

## 2022-01-18 MED ORDER — FLUMAZENIL 0.5 MG/5ML IV SOLN
INTRAVENOUS | Status: AC
  Filled 2022-01-18: qty 5

## 2022-01-18 MED ORDER — SODIUM CHLORIDE 0.9 % IV SOLN: INTRAVENOUS | Status: DC

## 2022-01-18 MED ORDER — HYDROCODONE-ACETAMINOPHEN 5-325 MG PO TABS: 1.0000 | ORAL_TABLET | ORAL | Status: DC | PRN

## 2022-01-18 MED ORDER — FENTANYL CITRATE (PF) 100 MCG/2ML IJ SOLN
INTRAMUSCULAR | Status: AC
  Filled 2022-01-18: qty 4

## 2022-01-18 MED ORDER — FENTANYL CITRATE (PF) 100 MCG/2ML IJ SOLN
INTRAMUSCULAR | Status: AC | PRN
  Administered 2022-01-18: 50 ug via INTRAVENOUS
  Administered 2022-01-18: 25 ug via INTRAVENOUS

## 2022-01-18 NOTE — Discharge Instructions (Addendum)
Discharge Instructions:   Please call Interventional Radiology clinic [phone] with any questions or concerns.  You may remove your dressing and shower tomorrow.     Moderate Conscious Sedation, Adult, Care After This sheet gives you information about how to care for yourself after your procedure. Your health care provider may also give you more specific instructions. If you have problems or questions, contact your health care provider. What can I expect after the procedure? After the procedure, it is common to have: Sleepiness for several hours. Impaired judgment for several hours. Difficulty with balance. Vomiting if you eat too soon. Follow these instructions at home: For the time period you were told by your health care provider:    Rest. Do not participate in activities where you could fall or become injured. No strenuous activity 24 to 46 hours Do not drive or use machinery. Do not drink alcohol. Do not take sleeping pills or medicines that cause drowsiness. Do not make important decisions or sign legal documents. Do not take care of children on your own. Eating and drinking  Follow the diet recommended by your health care provider. Drink enough fluid to keep your urine pale yellow. If you vomit: Drink water, juice, or soup when you can drink without vomiting. Make sure you have little or no nausea before eating solid foods. General instructions Take over-the-counter and prescription medicines only as told by your health care provider. Have a responsible adult stay with you for the time you are told. It is important to have someone help care for you until you are awake and alert. Do not smoke. Keep all follow-up visits as told by your health care provider. This is important. Contact a health care provider if: You are still sleepy or having trouble with balance after 24 hours. You feel light-headed. You keep feeling nauseous or you keep vomiting. You develop a  rash. You have a fever. You have redness or swelling around the IV site. Get help right away if: You have trouble breathing. You have new-onset confusion at home. Summary After the procedure, it is common to feel sleepy, have impaired judgment, or feel nauseous if you eat too soon. Rest after you get home. Know the things you should not do after the procedure. Follow the diet recommended by your health care provider and drink enough fluid to keep your urine pale yellow. Get help right away if you have trouble breathing or new-onset confusion at home. This information is not intended to replace advice given to you by your health care provider. Make sure you discuss any questions you have with your health care provider. Document Revised: 10/10/2019 Document Reviewed: 05/08/2019 Elsevier Patient Education  [place].   Needle Biopsy, Care After The following information offers guidance on how to care for yourself after your procedure. Your health care provider may also give you more specific instructions. If you have problems or questions, contact your health care provider. What can I expect after the procedure? After the procedure, it is common to have: Soreness, pain, and tenderness where a tissue sample was taken (biopsy site). Bruising or mild pain at the biopsy site. These symptoms should go away after a few days. Follow these instructions at home: Biopsy site care  Follow instructions from your health care provider about how to take care of your biopsy site. Make sure you: Wash your hands with soap and water for at least 20 seconds before and after you change your bandage (dressing). If soap and  water are not available, use hand sanitizer. Know when and how to change your dressing. Know when to remove your dressing. Check your puncture site every day for signs of infection. Check for: More redness, swelling, or pain. More drainage of fluid or blood. More warmth. Pus or a  bad smell. General instructions Rest as told by your health care provider. Do not take baths, swim, or use a hot tub until your health care provider approves. Ask your health care provider if you may take showers. You may only be allowed to take sponge baths. Take over-the-counter and prescription medicines only as told by your health care provider. Return to your normal activities as told by your health care provider. Ask your health care provider what activities are safe for you. If you have airplane travel scheduled, talk with your health care provider about when it is safe for you to travel by airplane. This is specific to certain biopsy procedures. It is up to you to get the results of your procedure. Ask your health care provider, or the department that is doing the procedure, when your results will be ready. Keep all follow-up visits. You may need to make an appointment to get your biopsy results. Contact a health care provider if: You have a fever. You have more redness, swelling, or pain at the puncture site that lasts longer than a few days. You have more fluid or blood coming from your puncture site. You have pus or a bad smell coming from your puncture site. Your puncture site feels warm to the touch. You have pain that does not get better with medicine. Get help right away if: You have severe bleeding from the puncture site. You have chest pain. You have problems breathing. You cough up blood. You faint. You have a very fast heart rate. These symptoms may be an emergency. Get help right away. Call 911. Do not wait to see if the symptoms will go away. Do not drive yourself to the hospital. Summary After the procedure, it is common to have soreness, bruising, tenderness, or mild pain at the biopsy site. These symptoms should go away in a few days. Check your biopsy site every day for signs of infection, such as more redness, swelling, or pain. Do not take baths, swim, or use a  hot tub until your health care provider approves. Ask your health care provider if you may take showers. Contact a heath care provider if you have more redness, swelling, or pain at the puncture site that lasts longer than a few days. This information is not intended to replace advice given to you by your health care provider. Make sure you discuss any questions you have with your health care provider. Document Revised: 06/08/2021 Document Reviewed: 06/08/2021 Elsevier Patient Education  [place].

## 2022-01-18 NOTE — Consult Note (Signed)
Chief Complaint: Patient was seen in consultation today for image guided biopsy of peritoneal/omental mass  Referring Physician(s): Dorsey,J  Supervising Physician: Arne Cleveland  Patient Status: [facility] - Out-pt  History of Present Illness: Daniel Gibson is a 71 y.o. male with past medical history significant for carotid artery disease with prior left CEA, coronary artery disease with prior MI/CABG, diabetes, hyperlipidemia, hypertension, obesity, prostate cancer with prior seed implantation who presents now with recent CT findings of right renal mass, omental nodules, right liver lesion, right lower lobe PE.  He is scheduled today for image guided peritoneal/omental nodule/mass biopsy.  Past Medical History:  Diagnosis Date   Carotid artery disease ([place]) 05/08/2012   CEA on left 2013   Coronary artery disease    Diabetes mellitus without complication ([place])    Food allergy    Alpha Gal allergy    Hyperlipidemia    Hypertensive heart disease without CHF 04/08/2018   Myocardial infarction ([place]) 1993   medically managed   Obesity (BMI 30-39.9) 04/08/2018   Prostate cancer ([place]) 08/07/2016   PVC's (premature ventricular contractions) 04/08/2018   Thrombocytopenia ([place])     Past Surgical History:  Procedure Laterality Date   APPENDECTOMY     CAROTID ENDARTERECTOMY Right 06-07-12   cea   CORONARY ARTERY BYPASS GRAFT N/A 10/18/2020   Procedure: CORONARY ARTERY BYPASS GRAFTING (CABG)x3. LEFT INTERNAL MAMMARY ARTERY. RIGHT ENDOSCOPIC SAPHENOUS VEIN HARVESTING.;  Surgeon: Wonda [place], MD;  Location: [place];  Service: Open Heart Surgery;  Laterality: N/A;   ENDARTERECTOMY  06/07/2012   Procedure: ENDARTERECTOMY CAROTID;  Surgeon: Angelia Mould, MD;  Location: [facility] OR;  Service: Vascular;  Laterality: Right;  Right Carotid Endarterectomy with Patch Angioplasty   LEFT HEART CATH AND CORONARY ANGIOGRAPHY N/A 10/07/2020   Procedure: LEFT HEART CATH AND CORONARY  ANGIOGRAPHY;  Surgeon: Troy Sine, MD;  Location: [place] [facility];  Service: Cardiovascular;  Laterality: N/A;   TEE WITHOUT CARDIOVERSION N/A 10/18/2020   Procedure: TRANSESOPHAGEAL ECHOCARDIOGRAM (TEE);  Surgeon: Wonda [place], MD;  Location: [place];  Service: Open Heart Surgery;  Laterality: N/A;   TRANSPERINEAL IMPLANT OF RADIATION SEEDS W/ ULTRASOUND      Allergies: Ace inhibitors, Albumin (human), Beef-derived products, Gelfoam-jmi sponge, Heparin, Pork-derived products, Sheep-derived products, and Tetanus toxoids  Medications: Prior to Admission medications   Medication Sig Start Date End Date Taking? Authorizing Provider  amLODipine (NORVASC) 10 MG tablet Take 10 mg by mouth in the morning.    [provider]  APIXABAN Arne Cleveland) VTE STARTER PACK ('10MG'$  AND '5MG'$ ) Take as directed on package: start with two-'5mg'$  tablets twice daily for 7 days. On day 8, switch to one-'5mg'$  tablet twice daily. Start taking after cleared by interventional radiologist 01/11/22   Kathie Dike, MD  carvedilol (COREG) 25 MG tablet Take 12.5 mg by mouth 2 (two) times daily with a meal.    [provider]  clopidogrel (PLAVIX) 75 MG tablet Take 1 tablet (75 mg total) by mouth daily. Resume after biopsy 01/11/22   Kathie Dike, MD  enoxaparin (LOVENOX) 120 MG/0.8ML injection Inject 0.8 mLs (120 mg total) into the skin daily for 6 days. Take last dose on 7/25, before 9am 01/12/22 01/18/22  Kathie Dike, MD  EPINEPHrine (EPIPEN 2-PAK) 0.3 mg/0.3 mL IJ SOAJ injection Use as directed for life-threatening allergic reaction. 05/10/20   Kozlow, Donnamarie Poag, MD  ezetimibe (ZETIA) 10 MG tablet TAKE 1 TABLET BY MOUTH EVERY DAY Patient taking differently: Take 10 mg  by mouth daily. 11/22/21   Richardo Priest, MD  metFORMIN (GLUCOPHAGE) 1000 MG tablet Take 500 mg by mouth in the morning and at bedtime.    [provider]  oxybutynin (DITROPAN) 5 MG tablet Take 5 mg by mouth. 12/30/21 12/30/22   [provider]  pantoprazole (PROTONIX) 40 MG tablet TAKE 1 TABLET BY MOUTH EVERY DAY Patient taking differently: Take 40 mg by mouth daily. 11/14/21   Richardo Priest, MD  Polyethylene Glycol 3350 (MIRALAX PO) Take 1 Package by mouth as needed (constipation).    [provider]  rosuvastatin (CRESTOR) 40 MG tablet TAKE 1 TABLET BY MOUTH EVERY DAY Patient taking differently: Take 40 mg by mouth daily. 09/26/21   Richardo Priest, MD  Tamsulosin HCl (FLOMAX) 0.4 MG CAPS Take 0.8 mg by mouth at bedtime.    [provider]  traZODone (DESYREL) 50 MG tablet Take 50 mg by mouth at bedtime.    [provider]     Family History  Problem Relation Age of Onset   Cancer Mother    Hyperlipidemia Mother    Hypertension Mother    Heart attack Father    Heart disease Father        Heart Disease before age 16   Hyperlipidemia Father    Hypertension Father    Diabetes Sister    Heart disease Sister        before age 10   Hypertension Sister    Hyperlipidemia Sister    Diabetes Brother    Heart disease Brother        before age 68   Hyperlipidemia Brother    Hypertension Brother     Social History   Socioeconomic History   Marital status: Married    Spouse name: Not on file   Number of children: Not on file   Years of education: Not on file   Highest education level: Not on file  Occupational History   Not on file  Tobacco Use   Smoking status: Never   Smokeless tobacco: Never  Vaping Use   Vaping Use: Never used  Substance and Sexual Activity   Alcohol use: No   Drug use: No   Sexual activity: Not on file  Other Topics Concern   Not on file  Social History Narrative   Not on file   Social Determinants of Health   Financial Resource Strain: Not on file  Food Insecurity: Not on file  Transportation Needs: Not on file  Physical Activity: Not on file  Stress: Not on file  Social Connections: Not on file      Review of Systems he  currently denies fever, headache, chest pain, dyspnea, cough, abdominal/back pain, nausea, vomiting or bleeding.  Vital Signs: Blood pressure 149/70, temperature 98, heart rate 69, respirations 15, O2 sat 99% room air       Physical Exam awake, alert.  Chest with few fine bibasilar crackles.  Heart with regular rate and rhythm.  Abdomen obese, soft, positive bowel sounds, nontender.  No significant lower extremity edema.  Imaging: VAS [place] LOWER EXTREMITY VENOUS (DVT)  Result Date: 01/11/2022  Lower Venous DVT Study Patient Name:  Daniel Gibson  Date of Exam:   01/11/2022 Medical Rec #: [id number]          Accession #:    [id number] Date of Birth: 1951/06/13           Patient Gender: M Patient Age:   32 years Exam  Location:  [facility] Procedure:      VAS [place] LOWER EXTREMITY VENOUS (DVT) Referring Phys: Gean Birchwood --------------------------------------------------------------------------------  Indications: Pulmonary embolism.  Comparison Study: No prior study Performing Technologist: Maudry Mayhew MHA, RDMS, RVT, RDCS  Examination Guidelines: A complete evaluation includes B-mode imaging, spectral Doppler, color Doppler, and power Doppler as needed of all accessible portions of each vessel. Bilateral testing is considered an integral part of a complete examination. Limited examinations for reoccurring indications may be performed as noted. The reflux portion of the exam is performed with the patient in reverse Trendelenburg.  +---------+---------------+---------+-----------+----------+--------------+ RIGHT    CompressibilityPhasicitySpontaneityPropertiesThrombus Aging +---------+---------------+---------+-----------+----------+--------------+ CFV      Full           Yes      Yes                                 +---------+---------------+---------+-----------+----------+--------------+ SFJ      Full                                                         +---------+---------------+---------+-----------+----------+--------------+ FV Prox  Full                                                        +---------+---------------+---------+-----------+----------+--------------+ FV Mid   Full                                                        +---------+---------------+---------+-----------+----------+--------------+ FV DistalFull                                                        +---------+---------------+---------+-----------+----------+--------------+ PFV      Full                                                        +---------+---------------+---------+-----------+----------+--------------+ POP      Full           Yes      Yes                                 +---------+---------------+---------+-----------+----------+--------------+ PTV      Full                                                        +---------+---------------+---------+-----------+----------+--------------+ PERO     Full                                                        +---------+---------------+---------+-----------+----------+--------------+   +---------+---------------+---------+-----------+----------+--------------+  LEFT     CompressibilityPhasicitySpontaneityPropertiesThrombus Aging +---------+---------------+---------+-----------+----------+--------------+ CFV      Full           Yes      Yes                                 +---------+---------------+---------+-----------+----------+--------------+ SFJ      Full                                                        +---------+---------------+---------+-----------+----------+--------------+ FV Prox  Full                                                        +---------+---------------+---------+-----------+----------+--------------+ FV Mid   Full                                                         +---------+---------------+---------+-----------+----------+--------------+ FV DistalFull                                                        +---------+---------------+---------+-----------+----------+--------------+ PFV      Full                                                        +---------+---------------+---------+-----------+----------+--------------+ POP      Full           Yes      Yes                                 +---------+---------------+---------+-----------+----------+--------------+ PTV      Full                                                        +---------+---------------+---------+-----------+----------+--------------+ PERO     Full                                                        +---------+---------------+---------+-----------+----------+--------------+     Summary: RIGHT: - There is no evidence of deep vein thrombosis in the lower extremity.  - No cystic structure found in the popliteal fossa.  LEFT: - There is no evidence of deep vein thrombosis in the lower extremity.  - No  cystic structure found in the popliteal fossa.  *See table(s) above for measurements and observations. Electronically signed by Monica Martinez MD on 01/11/2022 at 1:02:29 PM.    Final    CT Chest W Contrast  Result Date: 01/10/2022 CLINICAL DATA:  Renal mass, evaluate for metastatic disease EXAM: CT CHEST WITH CONTRAST TECHNIQUE: Multidetector CT imaging of the chest was performed during intravenous contrast administration. RADIATION DOSE REDUCTION: This exam was performed according to the departmental dose-optimization program which includes automated exposure control, adjustment of the mA and/or kV according to patient size and/or use of iterative reconstruction technique. CONTRAST:  68m OMNIPAQUE IOHEXOL 300 MG/ML  SOLN COMPARISON:  12/28/2021, 12/27/2021 FINDINGS: Cardiovascular: The heart is unremarkable without pericardial effusion. Diffuse coronary artery  atherosclerosis. No evidence of thoracic aortic aneurysm or dissection. Atherosclerosis of the aortic arch and descending thoracic aorta. Incidental filling defects are seen within segmental and subsegmental branches of the right lower lobe pulmonary artery consistent with pulmonary emboli. Clot burden is minimal. No right heart strain. Mediastinum/Nodes: No enlarged mediastinal, hilar, or axillary lymph nodes. Thyroid gland, trachea, and esophagus demonstrate no significant findings. Lungs/Pleura: Minimal patchy ground-glass attenuation right upper lobe image 30 2 May be inflammatory or infectious. Multiple sub 3 mm calcified granulomas are seen within the right lower lobe. No other pulmonary nodules or masses. No effusion or pneumothorax. Central airways are patent. Upper Abdomen: Partial visualization of a known upper pole right renal mass concerning for renal cell carcinoma. Enhancing area within the inferior right lobe liver on the 12/28/2021 exam, reference image 26/10, is not well visualized on this study due to timing of contrast bolus. Musculoskeletal: No acute or destructive bony lesions. Moderate midthoracic spondylosis. Reconstructed images demonstrate no additional findings. IMPRESSION: 1. Incidental right lower lobe segmental and subsegmental pulmonary emboli. Minimal clot burden without right heart strain. 2. No evidence of intrathoracic metastases. 3. Small focus of ground-glass airspace disease within the right upper lobe likely inflammatory or infectious. 4. Partial visualization of known right renal mass highly concerning for renal cell carcinoma. Indeterminate right lobe liver lesion seen on prior abdominal CT not well visualized on this exam due to timing of contrast bolus. 5. Aortic Atherosclerosis (ICD10-I70.0). Coronary artery atherosclerosis. Critical Value/emergent results were called by telephone at the time of interpretation on 01/10/2022 at 5:44 pm to [facility] who verbally  acknowledged these results. Electronically Signed   By: MRanda NgoM.D.   On: 01/10/2022 17:51    Labs:  CBC: Recent Labs    01/04/22 1320 01/10/22 1944 01/11/22 0400  WBC 8.0 9.1 6.2  HGB 13.7 13.0 12.7*  HCT 38.4* 36.8* 35.8*  PLT 182 158 147*    COAGS: Recent Labs    01/10/22 2021  INR 1.6*  APTT 20*    BMP: Recent Labs    01/04/22 1320 01/10/22 1944  NA 141 139  K 3.8 3.3*  CL 108 108  CO2 27 25  GLUCOSE 128* 98  BUN 9 12  CALCIUM 9.3 8.8*  CREATININE 1.23 1.02  GFRNONAA >60 >60    LIVER FUNCTION TESTS: Recent Labs    01/04/22 1320  BILITOT 0.7  AST 12*  ALT 10  ALKPHOS 53  PROT 6.6  ALBUMIN 4.0    TUMOR MARKERS: No results for input(s): "AFPTM", "CEA", "CA199", "CHROMGRNA" in the last 8760 hours.  Assessment and Plan: 71y.o. male with past medical history significant for carotid artery disease with prior left CEA, coronary artery disease with prior MI/CABG, diabetes,  hyperlipidemia, hypertension, obesity, prostate cancer with prior seed implantation who presents now with recent CT findings of right renal mass, omental nodules, right liver lesion, right lower lobe PE.  He is scheduled today for image guided peritoneal/omental nodule/mass biopsy.Risks and benefits of procedure was discussed with the patient  including, but not limited to bleeding, infection, damage to adjacent structures or low yield requiring additional tests.  All of the questions were answered and there is agreement to proceed.  Consent signed and in chart.  LABS PENDING  Thank you for this interesting consult.  I greatly enjoyed meeting Daniel Gibson and look forward to participating in their care.  A copy of this report was sent to the requesting provider on this date.  Electronically Signed: D. Rowe Robert, PA-C 01/18/2022, 7:55 AM   I spent a total of 20 minutes    in face to face in clinical consultation, greater than 50% of which was counseling/coordinating  care for image guided peritoneal/omental nodule/mass biopsy

## 2022-01-18 NOTE — Procedures (Signed)
  Procedure:  CT core biopsy peritoneal mass   Preprocedure diagnosis: The encounter diagnosis was Peritoneal lesion.  Postprocedure diagnosis: same EBL:    minimal Complications:   none immediate  See full dictation in [facility].  Dillard Cannon MD Main # [phone] Pager  [phone] Mobile [phone]

## 2022-01-20 ENCOUNTER — Encounter: Payer: Self-pay | Admitting: Physician Assistant

## 2022-01-20 ENCOUNTER — Telehealth: Payer: Self-pay | Admitting: Physician Assistant

## 2022-01-20 DIAGNOSIS — C7A8 Other malignant neuroendocrine tumors: Secondary | ICD-10-CM

## 2022-01-20 LAB — SURGICAL PATHOLOGY

## 2022-01-20 NOTE — Telephone Encounter (Signed)
I spoke to patient's wife and son regarding the peritoneal biopsy results from 01/18/2022. 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 Recommend PET DOTATATE scan for baseline staging and will follow up with Dr. Lorenso Courier to discuss treatment options. I briefly reviewed treatment recommendations including monthly lanreotide or octreotide injections.   Patient's wife and son expressed understanding and satisfaction with the plan provided.

## 2022-01-26 ENCOUNTER — Telehealth: Payer: Self-pay | Admitting: Physician Assistant

## 2022-01-26 ENCOUNTER — Other Ambulatory Visit: Payer: Self-pay | Admitting: Physician Assistant

## 2022-01-26 ENCOUNTER — Other Ambulatory Visit: Payer: Self-pay | Admitting: Hematology and Oncology

## 2022-01-26 DIAGNOSIS — C7A8 Other malignant neuroendocrine tumors: Secondary | ICD-10-CM

## 2022-01-26 HISTORY — DX: Other secondary neuroendocrine tumors: C7A.8

## 2022-01-26 NOTE — Telephone Encounter (Signed)
Per 8/3 schmsg called pt about appointment, no answer and unable to leave message.  Pt is mychart active

## 2022-01-30 ENCOUNTER — Other Ambulatory Visit (HOSPITAL_COMMUNITY): Payer: Medicare HMO

## 2022-01-30 ENCOUNTER — Encounter (HOSPITAL_COMMUNITY)
Admission: RE | Admit: 2022-01-30 | Discharge: 2022-01-30 | Disposition: A | Discharge status: Home / Self Care | Payer: Medicare HMO | Source: Ambulatory Visit | Attending: Physician Assistant | Admitting: Physician Assistant

## 2022-01-30 DIAGNOSIS — C7A8 Other malignant neuroendocrine tumors: Secondary | ICD-10-CM | POA: Insufficient documentation

## 2022-01-30 DIAGNOSIS — C7A1 Malignant poorly differentiated neuroendocrine tumors: Secondary | ICD-10-CM | POA: Diagnosis not present

## 2022-01-30 MED ORDER — COPPER CU 64 DOTATATE 1 MCI/ML IV SOLN
4.0000 | Freq: Once | INTRAVENOUS | Status: AC
  Administered 2022-01-30: 4.1 via INTRAVENOUS

## 2022-01-31 ENCOUNTER — Ambulatory Visit: Payer: Medicare HMO | Admitting: Cardiology

## 2022-01-31 ENCOUNTER — Encounter: Payer: Self-pay | Admitting: Cardiology

## 2022-01-31 VITALS — BP 126/60 | HR 72 | Ht 70.0 in | Wt 196.1 lb

## 2022-01-31 DIAGNOSIS — I493 Ventricular premature depolarization: Secondary | ICD-10-CM | POA: Diagnosis not present

## 2022-01-31 DIAGNOSIS — Z7901 Long term (current) use of anticoagulants: Secondary | ICD-10-CM | POA: Diagnosis not present

## 2022-01-31 DIAGNOSIS — I25119 Atherosclerotic heart disease of native coronary artery with unspecified angina pectoris: Secondary | ICD-10-CM

## 2022-01-31 DIAGNOSIS — E785 Hyperlipidemia, unspecified: Secondary | ICD-10-CM | POA: Diagnosis not present

## 2022-01-31 DIAGNOSIS — I119 Hypertensive heart disease without heart failure: Secondary | ICD-10-CM

## 2022-01-31 NOTE — Progress Notes (Signed)
Cardiology Office Note:    Date:  01/31/2022   ID:  Daniel Gibson, DOB August 24, 1950, MRN [id number]  PCP:  Leonides Sake, MD  Cardiologist:  Shirlee More, MD    Referring MD: Leonides Sake, MD    ASSESSMENT:    1. Coronary artery disease involving native heart with angina pectoris, unspecified vessel or lesion type ([place])   2. Hyperlipidemia, unspecified hyperlipidemia type   3. Hypertensive heart disease without CHF   4. PVC's (premature ventricular contractions)   5. Chronic anticoagulation    PLAN:    In order of problems listed above:  Fortunately from a cardiology perspective he continues to do well having no angina after CABG I have asked him to discontinue clopidogrel while he is anticoagulated with Eliquis and I suspect with metastatic cancer he will remain on anticoagulation indefinitely continue his current lipid-lowering high-dose rosuvastatin and Zetia effective he is reached target is having no angina and continue his calcium channel blocker. Continue current effective treatment reaching target rosuvastatin and Zetia Stable BP at target continue treatment including beta-blocker calcium channel blocker Stable having no recurrence of PVCs at this time He is anticoagulated with pulmonary embolism and metastatic neuroendocrine cancer and will likely remain anticoagulated indefinitely   Next appointment: 6 months   Medication Adjustments/Labs and Tests Ordered: Current medicines are reviewed at length with the patient today.  Concerns regarding medicines are outlined above.  Orders Placed This Encounter  Procedures   Comp Met (CMET)   Lipid Profile   No orders of the defined types were placed in this encounter.  Chief complaint follow-up for CAD   History of Present Illness:   This is Daniel Gibson is a 71 y.o. male with a hx of CAD with CABG 10/18/2020 mild bilateral carotid stenosis 40 to 59% hypertension hyperlipidemia and PVCs last seen  04/04/2021.  Compliance with diet, lifestyle and medications: Yes  A lot is happened since his last visit he had pulmonary emboli and is on Eliquis and asked me if he should be taking clopidogrel with that I told him no. He has been seen by urology with gross hematuria and oncology with neuroendocrine metastatic carcinoma.  Fortunately from cardiology perspective he has done well no bleeding complication of his anticoagulant and is not having cardiovascular symptoms of edema shortness of breath chest pain palpitation or syncope  Lipid profile 08/01/2021 LDL 54 cholesterol 107 A1c 6.6% Past Medical History:  Diagnosis Date   Carotid artery disease ([place]) 05/08/2012   CEA on left 2013   Coronary artery disease    Diabetes mellitus without complication ([place])    Food allergy    Alpha Gal allergy    Hyperlipidemia    Hypertensive heart disease without CHF 04/08/2018   Myocardial infarction ([place]) 1993   medically managed   Obesity (BMI 30-39.9) 04/08/2018   Prostate cancer ([place]) 08/07/2016   PVC's (premature ventricular contractions) 04/08/2018   Thrombocytopenia ([place])     Past Surgical History:  Procedure Laterality Date   APPENDECTOMY     CAROTID ENDARTERECTOMY Right 06-07-12   cea   CORONARY ARTERY BYPASS GRAFT N/A 10/18/2020   Procedure: CORONARY ARTERY BYPASS GRAFTING (CABG)x3. LEFT INTERNAL MAMMARY ARTERY. RIGHT ENDOSCOPIC SAPHENOUS VEIN HARVESTING.;  Surgeon: Wonda Olds, MD;  Location: [facility];  Service: Open Heart Surgery;  Laterality: N/A;   ENDARTERECTOMY  06/07/2012   Procedure: ENDARTERECTOMY CAROTID;  Surgeon: Angelia Mould, MD;  Location: [facility];  Service: Vascular;  Laterality: Right;  Right Carotid Endarterectomy with Patch Angioplasty   LEFT HEART CATH AND CORONARY ANGIOGRAPHY N/A 10/07/2020   Procedure: LEFT HEART CATH AND CORONARY ANGIOGRAPHY;  Surgeon: Troy Sine, MD;  Location: [facility];  Service: Cardiovascular;  Laterality: N/A;   TEE  WITHOUT CARDIOVERSION N/A 10/18/2020   Procedure: TRANSESOPHAGEAL ECHOCARDIOGRAM (TEE);  Surgeon: Wonda Olds, MD;  Location: [facility];  Service: Open Heart Surgery;  Laterality: N/A;   TRANSPERINEAL IMPLANT OF RADIATION SEEDS W/ ULTRASOUND      Current Medications: Current Meds  Medication Sig   amLODipine (NORVASC) 10 MG tablet Take 10 mg by mouth in the morning.   APIXABAN (ELIQUIS) VTE STARTER PACK (10MG AND 5MG) Take as directed on package: start with two-64m tablets twice daily for 7 days. On day 8, switch to one-524mtablet twice daily. Start taking after cleared by interventional radiologist   carvedilol (COREG) 25 MG tablet Take 12.5 mg by mouth 2 (two) times daily with a meal.   EPINEPHrine (EPIPEN 2-PAK) 0.3 mg/0.3 mL IJ SOAJ injection Use as directed for life-threatening allergic reaction.   ezetimibe (ZETIA) 10 MG tablet TAKE 1 TABLET BY MOUTH EVERY DAY   metFORMIN (GLUCOPHAGE) 1000 MG tablet Take 500 mg by mouth in the morning and at bedtime.   oxybutynin (DITROPAN) 5 MG tablet Take 5 mg by mouth.   pantoprazole (PROTONIX) 40 MG tablet TAKE 1 TABLET BY MOUTH EVERY DAY   Polyethylene Glycol 3350 (MIRALAX PO) Take 1 Package by mouth as needed (constipation).   rosuvastatin (CRESTOR) 40 MG tablet TAKE 1 TABLET BY MOUTH EVERY DAY   Tamsulosin HCl (FLOMAX) 0.4 MG CAPS Take 0.8 mg by mouth at bedtime.   traZODone (DESYREL) 50 MG tablet Take 50 mg by mouth at bedtime.     Allergies:   Ace inhibitors, Albumin (human), Beef-derived products, Gelfoam-jmi sponge, Heparin, Pork-derived products, Sheep-derived products, and Tetanus toxoids   Social History   Socioeconomic History   Marital status: Married    Spouse name: Not on file   Number of children: Not on file   Years of education: Not on file   Highest education level: Not on file  Occupational History   Not on file  Tobacco Use   Smoking status: Never   Smokeless tobacco: Never  Vaping Use   Vaping Use: Never used   Substance and Sexual Activity   Alcohol use: No   Drug use: No   Sexual activity: Not on file  Other Topics Concern   Not on file  Social History Narrative   Not on file   Social Determinants of Health   Financial Resource Strain: Not on file  Food Insecurity: Not on file  Transportation Needs: Not on file  Physical Activity: Not on file  Stress: Not on file  Social Connections: Not on file     Family History: The patient's family history includes Cancer in his mother; Diabetes in his brother and sister; Heart attack in his father; Heart disease in his brother, father, and sister; Hyperlipidemia in his brother, father, mother, and sister; Hypertension in his brother, father, mother, and sister. ROS:   Please see the history of present illness.    All other systems reviewed and are negative.  EKGs/Labs/Other Studies Reviewed:    The following studies were reviewed today:  EKG:  EKG performed 01/12/2022 inpatient independently reviewed demonstrates sinus rhythm 1 APC otherwise normal  Recent Labs: 01/04/2022: ALT 10 01/11/2022: B Natriuretic Peptide 180.5 01/18/2022: BUN 15; Creatinine,  Ser 1.11; Hemoglobin 13.0; Platelets 126; Potassium 4.3; Sodium 141  Recent Lipid Panel    Component Value Date/Time   CHOL 117 11/29/2020 1049   TRIG 107 11/29/2020 1049   HDL 39 (L) 11/29/2020 1049   CHOLHDL 3.0 11/29/2020 1049   LDLCALC 58 11/29/2020 1049    Physical Exam:    VS:  BP 126/60   Pulse 72   Ht '5\' 10"'  (1.778 m)   Wt 196 lb 1.3 oz (88.9 kg)   SpO2 96%   BMI 28.13 kg/m     Wt Readings from Last 3 Encounters:  01/31/22 196 lb 1.3 oz (88.9 kg)  01/18/22 179 lb 14.3 oz (81.6 kg)  01/10/22 180 lb (81.6 kg)     GEN:  Well nourished, well developed in no acute distress HEENT: Normal NECK: No JVD; No carotid bruits LYMPHATICS: No lymphadenopathy CARDIAC: RRR, no murmurs, rubs, gallops RESPIRATORY:  Clear to auscultation without rales, wheezing or rhonchi  ABDOMEN:  Soft, non-tender, non-distended MUSCULOSKELETAL:  No edema; No deformity  SKIN: Warm and dry NEUROLOGIC:  Alert and oriented x 3 PSYCHIATRIC:  Normal affect    Signed, Shirlee More, MD  01/31/2022 12:09 PM    [place]

## 2022-01-31 NOTE — Patient Instructions (Signed)
Medication Instructions:  Your physician recommends that you continue on your current medications as directed. Please refer to the Current Medication list given to you today.  *If you need a refill on your cardiac medications before your next appointment, please call your pharmacy*   Lab Work: Your physician recommends that you return for lab work in:   Labs today on 2nd floor: CMP, Lipids  If you have labs (blood work) drawn today and your tests are completely normal, you will receive your results only by: [place] (if you have [facility]) OR A paper copy in the mail If you have any lab test that is abnormal or we need to change your treatment, we will call you to review the results.   Testing/Procedures: None   Follow-Up: At [facility], you and your health needs are our priority.  As part of our continuing mission to provide you with exceptional heart care, we have created designated Provider Care Teams.  These Care Teams include your primary Cardiologist (physician) and Advanced Practice Providers (APPs -  Physician Assistants and Nurse Practitioners) who all work together to provide you with the care you need, when you need it.  We recommend signing up for the patient portal called "MyChart".  Sign up information is provided on this After Visit Summary.  MyChart is used to connect with patients for Virtual Visits (Telemedicine).  Patients are able to view lab/test results, encounter notes, upcoming appointments, etc.  Non-urgent messages can be sent to your provider as well.   To learn more about what you can do with MyChart, go to [email or web address].    Your next appointment:   9 month(s)  The format for your next appointment:   In Person  Provider:   Shirlee More, MD    Other Instructions None  Important Information About [place]

## 2022-02-01 ENCOUNTER — Inpatient Hospital Stay: Payer: Medicare HMO | Admitting: Physician Assistant

## 2022-02-01 ENCOUNTER — Other Ambulatory Visit: Payer: Self-pay | Admitting: Physician Assistant

## 2022-02-01 ENCOUNTER — Inpatient Hospital Stay: Payer: Medicare HMO

## 2022-02-01 ENCOUNTER — Ambulatory Visit: Payer: Medicare HMO | Admitting: Physician Assistant

## 2022-02-01 ENCOUNTER — Other Ambulatory Visit: Payer: Self-pay

## 2022-02-01 ENCOUNTER — Other Ambulatory Visit: Payer: Medicare HMO

## 2022-02-01 ENCOUNTER — Inpatient Hospital Stay: Payer: Medicare HMO | Attending: Physician Assistant

## 2022-02-01 VITALS — BP 149/68 | HR 70 | Temp 98.1°F | Resp 20 | Wt 197.5 lb

## 2022-02-01 DIAGNOSIS — C7B8 Other secondary neuroendocrine tumors: Secondary | ICD-10-CM

## 2022-02-01 DIAGNOSIS — C7A8 Other malignant neuroendocrine tumors: Secondary | ICD-10-CM | POA: Insufficient documentation

## 2022-02-01 LAB — CMP (CANCER CENTER ONLY)
ALT: 9 U/L (ref 0–44)
AST: 11 U/L — ABNORMAL LOW (ref 15–41)
Albumin: 4.2 g/dL (ref 3.5–5.0)
Alkaline Phosphatase: 52 U/L (ref 38–126)
Anion gap: 5 (ref 5–15)
BUN: 14 mg/dL (ref 8–23)
CO2: 27 mmol/L (ref 22–32)
Calcium: 8.8 mg/dL — ABNORMAL LOW (ref 8.9–10.3)
Chloride: 105 mmol/L (ref 98–111)
Creatinine: 1.28 mg/dL — ABNORMAL HIGH (ref 0.61–1.24)
GFR, Estimated: >60 mL/min (ref >60)
Glucose, Bld: 137 mg/dL — ABNORMAL HIGH (ref 70–99)
Potassium: 3.9 mmol/L (ref 3.5–5.1)
Sodium: 137 mmol/L (ref 135–145)
Total Bilirubin: 0.6 mg/dL (ref 0.3–1.2)
Total Protein: 6.5 g/dL (ref 6.5–8.1)

## 2022-02-01 LAB — COMPREHENSIVE METABOLIC PANEL
ALT: 10 IU/L (ref 0–44)
AST: 15 IU/L (ref 0–40)
Albumin/Globulin Ratio: 2.3 — ABNORMAL HIGH (ref 1.2–2.2)
Albumin: 4.3 g/dL (ref 3.9–4.9)
Alkaline Phosphatase: 62 IU/L (ref 44–121)
BUN/Creatinine Ratio: 10 (ref 10–24)
BUN: 12 mg/dL (ref 8–27)
Bilirubin Total: 0.6 mg/dL (ref 0.0–1.2)
CO2: 19 mmol/L — ABNORMAL LOW (ref 20–29)
Calcium: 9.2 mg/dL (ref 8.6–10.2)
Chloride: 104 mmol/L (ref 96–106)
Creatinine, Ser: 1.22 mg/dL (ref 0.76–1.27)
Globulin, Total: 1.9 g/dL (ref 1.5–4.5)
Glucose: 181 mg/dL — ABNORMAL HIGH (ref 70–99)
Potassium: 4.3 mmol/L (ref 3.5–5.2)
Sodium: 138 mmol/L (ref 134–144)
Total Protein: 6.2 g/dL (ref 6.0–8.5)
eGFR: 64 mL/min/{1.73_m2} (ref >59)

## 2022-02-01 LAB — CBC WITH DIFFERENTIAL (CANCER CENTER ONLY)
Abs Immature Granulocytes: 0.02 10*3/uL (ref 0.00–0.07)
Basophils Absolute: 0.1 10*3/uL (ref 0.0–0.1)
Basophils Relative: 1 %
Eosinophils Absolute: 0.6 10*3/uL — ABNORMAL HIGH (ref 0.0–0.5)
Eosinophils Relative: 8 %
HCT: 36.3 % — ABNORMAL LOW (ref 39.0–52.0)
Hemoglobin: 13.2 g/dL (ref 13.0–17.0)
Immature Granulocytes: 0 %
Lymphocytes Relative: 7 %
Lymphs Abs: 0.5 10*3/uL — ABNORMAL LOW (ref 0.7–4.0)
MCH: 31.2 pg (ref 26.0–34.0)
MCHC: 36.4 g/dL — ABNORMAL HIGH (ref 30.0–36.0)
MCV: 85.8 fL (ref 80.0–100.0)
Monocytes Absolute: 0.7 10*3/uL (ref 0.1–1.0)
Monocytes Relative: 10 %
Neutro Abs: 5.1 10*3/uL (ref 1.7–7.7)
Neutrophils Relative %: 74 %
Platelet Count: 141 10*3/uL — ABNORMAL LOW (ref 150–400)
RBC: 4.23 MIL/uL (ref 4.22–5.81)
RDW: 11.9 % (ref 11.5–15.5)
WBC Count: 7 10*3/uL (ref 4.0–10.5)
nRBC: 0 % (ref 0.0–0.2)

## 2022-02-01 LAB — LIPID PANEL
Chol/HDL Ratio: 2.2 ratio (ref 0.0–5.0)
Cholesterol, Total: 103 mg/dL (ref 100–199)
HDL: 46 mg/dL (ref >39)
LDL Chol Calc (NIH): 42 mg/dL (ref 0–99)
Triglycerides: 68 mg/dL (ref 0–149)
VLDL Cholesterol Cal: 15 mg/dL (ref 5–40)

## 2022-02-01 MED ORDER — LANREOTIDE ACETATE 120 MG/0.5ML ~~LOC~~ SOLN
120.0000 mg | Freq: Once | SUBCUTANEOUS | Status: AC
  Administered 2022-02-01: 120 mg via SUBCUTANEOUS
  Filled 2022-02-01: qty 120

## 2022-02-01 NOTE — Progress Notes (Signed)
[place] Telephone:([phone]   Fax:([phone]) [phone]  PROGRESS NOTE:  Patient Care Team: Leonides Sake, MD as PCP - General (Family Medicine) Jacolyn Reedy, MD as Attending Physician (Cardiology) Arloa Koh, MD (Inactive) as Attending Physician (Radiation Oncology) Rana Snare, MD (Inactive) as Attending Physician (Urology)  CHIEF COMPLAINTS/PURPOSE OF CONSULTATION:  Right renal mass and peritoneal nodularity  ONCOLOGIC HISTORY: Presented to the [facility] ER on 12/11/2021 for painless hematuria and urinary urgency.  Work-up included urinalysis showed large leukocyte esterase, blood, protein, positive nitrates.  Urine culture grew E. coli.  Patient was treated with cefdinir.  He underwent CT imaging that showed a nonobstructing 6 mm stone in the interpolar left kidney, indeterminate 2.9 x 3.4 x 4.3 cm lesion in the upper pole right kidney, multiple peritoneal and omental nodules concerning for peritoneal carcinomatosis. 01/10/2022: CT chest: Incidental right lower lobe segmental and subsegmental pulmonary emboli.Minimal clot burden without right heart strain.No evidence of intrathoracic metastases.Small focus of ground-glass airspace disease within the right upper lobe likely inflammatory or infectious. Started on Eliquis therapy 01/18/2022: CT guided biopsy of peritoneal mass: Well-differentiated low-grade neuroendocrine neoplasm with features  suspicious carcinoid tumor. Ki-67 shows very low proliferative index score.  01/30/2022: PET DOTATATE: pending results 02/01/2022: Start lanreotide monthly injections  INTERIM HISTORY:  Daniel Gibson returns today for a follow up visit after recent diagnosis of low grade neuroendocrine carcinoma. He is accompanied by his wife for this visit.   Daniel Gibson is doing well without any significant changes. He reports his energy and appetite are stable. He has gained some weight back after initial visit. He denies  any nausea, vomiting or abdominal pain. His bowel habits are unchanged without diarrhea or constipation. He completed his IV antibiotics for UTI/pyelonephritis. He denies any recurrent urinary symptoms including hematuria or dysuria.[name] He denies fevers, chills,sweats, shortness of breath, chest pain, cough, flushing or palpitations. He has no other complaints. Rest of the 10 point ROS is below.   MEDICAL HISTORY:  Past Medical History:  Diagnosis Date   Carotid artery disease ([place]) 05/08/2012   CEA on left 2013   Coronary artery disease    Diabetes mellitus without complication ([place])    Food allergy    Alpha Gal allergy    Hyperlipidemia    Hypertensive heart disease without CHF 04/08/2018   Myocardial infarction ([place]) 1993   medically managed   Obesity (BMI 30-39.9) 04/08/2018   Prostate cancer ([place]) 08/07/2016   PVC's (premature ventricular contractions) 04/08/2018   Thrombocytopenia ([place])     SURGICAL HISTORY: Past Surgical History:  Procedure Laterality Date   APPENDECTOMY     CAROTID ENDARTERECTOMY Right 06-07-12   cea   CORONARY ARTERY BYPASS GRAFT N/A 10/18/2020   Procedure: CORONARY ARTERY BYPASS GRAFTING (CABG)x3. LEFT INTERNAL MAMMARY ARTERY. RIGHT ENDOSCOPIC SAPHENOUS VEIN HARVESTING.;  Surgeon: Wonda Olds, MD;  Location: [facility];  Service: Open Heart Surgery;  Laterality: N/A;   ENDARTERECTOMY  06/07/2012   Procedure: ENDARTERECTOMY CAROTID;  Surgeon: Angelia Mould, MD;  Location[facility];  Service: Vascular;  Laterality: Right;  Right Carotid Endarterectomy with Patch Angioplasty   LEFT HEART CATH AND CORONARY ANGIOGRAPHY N/A 10/07/2020   Procedure: LEFT HEART CATH AND CORONARY ANGIOGRAPHY;  Surgeon: Troy Sine, MD;  Location: [facility];  Service: Cardiovascular;  Laterality: N/A;   TEE WITHOUT CARDIOVERSION N/A 10/18/2020   Procedure: TRANSESOPHAGEAL ECHOCARDIOGRAM (TEE);  Surgeon: Wonda Olds, MD;  Location: [facility];  Service:  Open Heart  Surgery;  Laterality: N/A;   TRANSPERINEAL IMPLANT OF RADIATION SEEDS W/ ULTRASOUND      SOCIAL HISTORY: Social History   Socioeconomic History   Marital status: Married    Spouse name: Not on file   Number of children: Not on file   Years of education: Not on file   Highest education level: Not on file  Occupational History   Not on file  Tobacco Use   Smoking status: Never   Smokeless tobacco: Never  Vaping Use   Vaping Use: Never used  Substance and Sexual Activity   Alcohol use: No   Drug use: No   Sexual activity: Not on file  Other Topics Concern   Not on file  Social History Narrative   Not on file   Social Determinants of Health   Financial Resource Strain: Not on file  Food Insecurity: Not on file  Transportation Needs: Not on file  Physical Activity: Not on file  Stress: Not on file  Social Connections: Not on file  Intimate Partner Violence: Not on file    FAMILY HISTORY: Family History  Problem Relation Age of Onset   Cancer Mother    Hyperlipidemia Mother    Hypertension Mother    Heart attack Father    Heart disease Father        Heart Disease before age 58   Hyperlipidemia Father    Hypertension Father    Diabetes Sister    Heart disease Sister        before age 27   Hypertension Sister    Hyperlipidemia Sister    Diabetes Brother    Heart disease Brother        before age 63   Hyperlipidemia Brother    Hypertension Brother     ALLERGIES:  is allergic to ace inhibitors, albumin (human), beef-derived products, gelfoam-jmi sponge, heparin, pork-derived products, sheep-derived products, and tetanus toxoids.  MEDICATIONS:  Current Outpatient Medications  Medication Sig Dispense Refill   amLODipine (NORVASC) 10 MG tablet Take 10 mg by mouth in the morning.     APIXABAN (ELIQUIS) VTE STARTER PACK (10MG AND 5MG) Take as directed on package: start with two-46m tablets twice daily for 7 days. On day 8, switch to one-553mtablet twice daily.  Start taking after cleared by interventional radiologist 74 each 0   carvedilol (COREG) 25 MG tablet Take 12.5 mg by mouth 2 (two) times daily with a meal.     EPINEPHrine (EPIPEN 2-PAK) 0.3 mg/0.3 mL IJ SOAJ injection Use as directed for life-threatening allergic reaction. 2 each 3   ezetimibe (ZETIA) 10 MG tablet TAKE 1 TABLET BY MOUTH EVERY DAY 90 tablet 3   metFORMIN (GLUCOPHAGE) 1000 MG tablet Take 500 mg by mouth in the morning and at bedtime.     pantoprazole (PROTONIX) 40 MG tablet TAKE 1 TABLET BY MOUTH EVERY DAY 90 tablet 3   Polyethylene Glycol 3350 (MIRALAX PO) Take 1 Package by mouth as needed (constipation).     rosuvastatin (CRESTOR) 40 MG tablet TAKE 1 TABLET BY MOUTH EVERY DAY 90 tablet 1   Tamsulosin HCl (FLOMAX) 0.4 MG CAPS Take 0.8 mg by mouth at bedtime.     traZODone (DESYREL) 50 MG tablet Take 50 mg by mouth at bedtime.     TROSPIUM CHLORIDE PO Take 60 mg by mouth daily.     oxybutynin (DITROPAN) 5 MG tablet Take 5 mg by mouth. (Patient not taking: Reported on 02/01/2022)  No current facility-administered medications for this visit.    REVIEW OF SYSTEMS:   Constitutional: ( - ) fevers, ( - )  chills , ( - ) night sweats Eyes: ( - ) blurriness of vision, ( - ) double vision, ( - ) watery eyes Ears, nose, mouth, throat, and face: ( - ) mucositis, ( - ) sore throat Respiratory: ( - ) cough, ( - ) dyspnea, ( - ) wheezes Cardiovascular: ( - ) palpitation, ( - ) chest discomfort, ( - ) lower extremity swelling Gastrointestinal:  ( - ) nausea, ( - ) heartburn, ( - ) change in bowel habits Skin: ( - ) abnormal skin rashes Lymphatics: ( - ) new lymphadenopathy, ( - ) easy bruising Neurological: ( - ) numbness, ( - ) tingling, ( - ) new weaknesses Behavioral/Psych: ( - ) mood change, ( - ) new changes  All other systems were reviewed with the patient and are negative.  PHYSICAL EXAMINATION: ECOG PERFORMANCE STATUS: 0 - Asymptomatic  Vitals:   02/01/22 1248  BP: (!)  149/68  Pulse: 70  Resp: 20  Temp: 98.1 F (36.7 C)  SpO2: 98%   Filed Weights   02/01/22 1248  Weight: 197 lb 8 oz (89.6 kg)    GENERAL: well appearing male in NAD  SKIN: skin color, texture, turgor are normal, no rashes or significant lesions EYES: conjunctiva are pink and non-injected, sclera clear OROPHARYNX: no exudate, no erythema; lips, buccal mucosa, and tongue normal  NECK: supple, non-tender LYMPH:  no palpable lymphadenopathy in the cervical or supraclavicular lymph nodes.  LUNGS: clear to auscultation and percussion with normal breathing effort HEART: regular rate & rhythm and no murmurs and no lower extremity edema ABDOMEN: soft, non-tender, non-distended, normal bowel sounds Musculoskeletal: no cyanosis of digits and no clubbing  PSYCH: alert & oriented x 3, fluent speech NEURO: no focal motor/sensory deficits  LABORATORY DATA:  I have reviewed the data as listed    Latest Ref Rng & Units 02/01/2022   12:11 PM 01/18/2022    8:10 AM 01/11/2022    4:00 AM  CBC  WBC 4.0 - 10.5 K/uL 7.0  6.9  6.2   Hemoglobin 13.0 - 17.0 g/dL 13.2  13.0  12.7   Hematocrit 39.0 - 52.0 % 36.3  37.3  35.8   Platelets 150 - 400 K/uL 141  126  147        Latest Ref Rng & Units 02/01/2022   12:11 PM 01/31/2022   10:09 AM 01/18/2022    8:10 AM  CMP  Glucose 70 - 99 mg/dL 137  181  171   BUN 8 - 23 mg/dL _0 Creatinine 0.61 - 1.24 mg/dL 1.28  1.22  1.11   Sodium 135 - 145 mmol/L 137  138  141   Potassium 3.5 - 5.1 mmol/L 3.9  4.3  4.3   Chloride 98 - 111 mmol/L 105  104  111   CO2 22 - 32 mmol/L _1 Calcium 8.9 - 10.3 mg/dL 8.8  9.2  9.1   Total Protein 6.5 - 8.1 g/dL 6.5  6.2    Total Bilirubin 0.3 - 1.2 mg/dL 0.6  0.6    Alkaline Phos 38 - 126 U/L 52  62    AST 15 - 41 U/L 11  15    ALT 0 - 44 U/L 9  10       RADIOGRAPHIC STUDIES:  I have personally reviewed the radiological images as listed and agreed with the findings in the report. CT ABDOMINAL MASS  BIOPSY  Result Date: 01/18/2022 CLINICAL DATA:  Peritoneal nodularity, no known primary EXAM: CT GUIDED CORE BIOPSY OF PERITONEAL MASS ANESTHESIA/SEDATION: Intravenous Fentanyl 20mg and Versed 1.530mwere administered as conscious sedation during continuous monitoring of the patient's level of consciousness and physiological / cardiorespiratory status by the radiology RN, with a total moderate sedation time of 14 minutes. PROCEDURE: The procedure risks, benefits, and alternatives were explained to the patient. Questions regarding the procedure were encouraged and answered. The patient understands and consents to the procedure. Select axial scans through the abdomen were obtained. Anterior peritoneal mass was localized and an appropriate skin entry site was determined and marked. The operative field was prepped with chlorhexidinein a sterile fashion, and a sterile drape was applied covering the operative field. A sterile gown and sterile gloves were used for the procedure. Local anesthesia was provided with 1% Lidocaine. Under CT fluoroscopic guidance, a 17 gauge trocar needle was advanced to the margin of the lesion. Once needle tip position was confirmed, coaxial 18-gauge core biopsy samples were obtained, submitted in saline to surgical pathology. The guide needle was removed. Postprocedure scans show no hemorrhage or other apparent complication. The patient tolerated the procedure well. COMPLICATIONS: None immediate FINDINGS: Multiple peritoneal nodules were identified. Core biopsy samples of a representative lesion were obtained under CT guidance. IMPRESSION: 1. Technically successful CT-guided core biopsy, peritoneal mass. RADIATION DOSE REDUCTION: This exam was performed according to the departmental dose-optimization program which includes automated exposure control, adjustment of the mA and/or kV according to patient size and/or use of iterative reconstruction technique. Electronically Signed   By: D Lucrezia Europe.D.   On: 01/18/2022 15:34   VAS US[place]OWER EXTREMITY VENOUS (DVT)  Result Date: 01/11/2022  Lower Venous DVT Study Patient Name:  KEMEHDI GIRONDADate of Exam:   01/11/2022 Medical Rec #: [id number]        Accession #:    [id number] of Birth: 4/Nov 02, 1950         Patient Gender: M Patient Age:   [age] [facility]      VAS US[place]OWER EXTREMITY VENOUS (DVT) Referring Phys: ARGean Birchwood-------------------------------------------------------------------------------  Indications: Pulmonary embolism.  Comparison Study: No prior study Performing Technologist: MiMaudry MayhewHA, RDMS, RVT, RDCS  Examination Guidelines: A complete evaluation includes B-mode imaging, spectral Doppler, color Doppler, and power Doppler as needed of all accessible portions of each vessel. Bilateral testing is considered an integral part of a complete examination. Limited examinations for reoccurring indications may be performed as noted. The reflux portion of the exam is performed with the patient in reverse Trendelenburg.  +---------+---------------+---------+-----------+----------+--------------+ RIGHT    CompressibilityPhasicitySpontaneityPropertiesThrombus Aging +---------+---------------+---------+-----------+----------+--------------+ CFV      Full           Yes      Yes                                 +---------+---------------+---------+-----------+----------+--------------+ SFJ      Full                                                        +---------+---------------+---------+-----------+----------+--------------+  FV Prox  Full                                                        +---------+---------------+---------+-----------+----------+--------------+ FV Mid   Full                                                        +---------+---------------+---------+-----------+----------+--------------+ FV DistalFull                                                         +---------+---------------+---------+-----------+----------+--------------+ PFV      Full                                                        +---------+---------------+---------+-----------+----------+--------------+ POP      Full           Yes      Yes                                 +---------+---------------+---------+-----------+----------+--------------+ PTV      Full                                                        +---------+---------------+---------+-----------+----------+--------------+ PERO     Full                                                        +---------+---------------+---------+-----------+----------+--------------+   +---------+---------------+---------+-----------+----------+--------------+ LEFT     CompressibilityPhasicitySpontaneityPropertiesThrombus Aging +---------+---------------+---------+-----------+----------+--------------+ CFV      Full           Yes      Yes                                 +---------+---------------+---------+-----------+----------+--------------+ SFJ      Full                                                        +---------+---------------+---------+-----------+----------+--------------+ FV Prox  Full                                                        +---------+---------------+---------+-----------+----------+--------------+  FV Mid   Full                                                        +---------+---------------+---------+-----------+----------+--------------+ FV DistalFull                                                        +---------+---------------+---------+-----------+----------+--------------+ PFV      Full                                                        +---------+---------------+---------+-----------+----------+--------------+ POP      Full           Yes      Yes                                  +---------+---------------+---------+-----------+----------+--------------+ PTV      Full                                                        +---------+---------------+---------+-----------+----------+--------------+ PERO     Full                                                        +---------+---------------+---------+-----------+----------+--------------+     Summary: RIGHT: - There is no evidence of deep vein thrombosis in the lower extremity.  - No cystic structure found in the popliteal fossa.  LEFT: - There is no evidence of deep vein thrombosis in the lower extremity.  - No cystic structure found in the popliteal fossa.  *See table(s) above for measurements and observations. Electronically signed by Monica Martinez MD on 01/11/2022 at 1:02:29 PM.    Final    CT Chest W Contrast  Result Date: 01/10/2022 CLINICAL DATA:  Renal mass, evaluate for metastatic disease EXAM: CT CHEST WITH CONTRAST TECHNIQUE: Multidetector CT imaging of the chest was performed during intravenous contrast administration. RADIATION DOSE REDUCTION: This exam was performed according to the departmental dose-optimization program which includes automated exposure control, adjustment of the mA and/or kV according to patient size and/or use of iterative reconstruction technique. CONTRAST:  45m OMNIPAQUE IOHEXOL 300 MG/ML  SOLN COMPARISON:  12/28/2021, 12/27/2021 FINDINGS: Cardiovascular: The heart is unremarkable without pericardial effusion. Diffuse coronary artery atherosclerosis. No evidence of thoracic aortic aneurysm or dissection. Atherosclerosis of the aortic arch and descending thoracic aorta. Incidental filling defects are seen within segmental and subsegmental branches of the right lower lobe pulmonary artery consistent with pulmonary emboli. Clot burden is minimal. No right heart strain. Mediastinum/Nodes: No enlarged mediastinal, hilar, or axillary lymph nodes. Thyroid gland, trachea, and esophagus  demonstrate no significant findings. Lungs/Pleura: Minimal patchy ground-glass attenuation right upper lobe image 30 2 May be inflammatory or infectious. Multiple sub 3 mm calcified granulomas are seen within the right lower lobe. No other pulmonary nodules or masses. No effusion or pneumothorax. Central airways are patent. Upper Abdomen: Partial visualization of a known upper pole right renal mass concerning for renal cell carcinoma. Enhancing area within the inferior right lobe liver on the 12/28/2021 exam, reference image 26/10, is not well visualized on this study due to timing of contrast bolus. Musculoskeletal: No acute or destructive bony lesions. Moderate midthoracic spondylosis. Reconstructed images demonstrate no additional findings. IMPRESSION: 1. Incidental right lower lobe segmental and subsegmental pulmonary emboli. Minimal clot burden without right heart strain. 2. No evidence of intrathoracic metastases. 3. Small focus of ground-glass airspace disease within the right upper lobe likely inflammatory or infectious. 4. Partial visualization of known right renal mass highly concerning for renal cell carcinoma. Indeterminate right lobe liver lesion seen on prior abdominal CT not well visualized on this exam due to timing of contrast bolus. 5. Aortic Atherosclerosis (ICD10-I70.0). Coronary artery atherosclerosis. Critical Value/emergent results were called by telephone at the time of interpretation on 01/10/2022 at 5:44 pm to provider [facility], who verbally acknowledged these results. Electronically Signed   By: Randa Ngo M.D.   On: 01/10/2022 17:51    ASSESSMENT & PLAN Daniel Gibson is a 71 y.o. male who presents for a follow up for newly diagnosed well differentiated neuroendocrine carcinoma, low grade.   #Well differentiated neuroendocrine carcinoma, low grade: --CT guided peritoneal mass biopsy on 01/18/2022 confirmed above diagnosis. --PET DOTATATE was completed on 01/30/2022, report  pending --Recommend monthly lanreotide injection q 28 days. Reviewed dose, frequency and common side effects including injection site reaction, cholelithiasis, gallbladder sludge, hyperglycemia, hypoglycemia, thyroid dysfunction and bradycardia.  --Labs from today were reviewed and require no intervention. Baseline Chromogranin levels are pending.  PLAN: --Continue with monthly lanreotide injection q 28 days. --CBC and CMP every month before treatment. Chromagranin levels every 3 months --RTC in 3 months for a toxicity evaluation  #History of UTI/Pyelonephritis: --Receiving IV ceftriaxone x 10 days, completed 01/06/2022.  Orders Placed This Encounter  Procedures   CBC with Differential ([place] Only)    Standing Status:   Standing    Number of Occurrences:   30    Standing Expiration Date:   02/02/2023   CMP ([place] only)    Standing Status:   Standing    Number of Occurrences:   30    Standing Expiration Date:   02/02/2023   Chromogranin A    Standing Status:   Standing    Number of Occurrences:   30    Standing Expiration Date:   02/02/2023    All questions were answered. The patient knows to call the clinic with any problems, questions or concerns.  I have spent a total of 30 minutes minutes of face-to-face and non-face-to-face time, preparing to see the patient,  performing a medically appropriate examination, counseling and educating the patient, ordering tests/procedures,documenting clinical information in the electronic health record, independently interpreting results and communicating results to the patient, and care coordination.   Dede Query, PA-C Department of Hematology/Oncology [place] at [facility] Phone: [phone]  Patient was seen with Dr. Lorenso Courier  I have read the above note and personally examined the patient. I agree with the assessment and plan as noted above.  Briefly Mr. Scholle is a 71 year old  male who was recently  diagnosed with peritoneal nodularity which status post biopsy confirmed well-differentiated neuroendocrine tumor.  We also performed a PET dotatate scan which confirmed these lesions to be avid and consistent with neuroendocrine tumor.  At this time we would recommend treatment with lanreotide q. 28 days.  Today we discussed the risks and benefits of this treatment as well as expected side effects.  Additionally the patient has a renal mass which will need to be evaluated by the urology service.  It was not avid on the PET CT scan and therefore may represent a separate entity.  The patient voices understanding of the treatment plan moving forward and was willing and able to proceed.  We will plan to see him back in 1 month in time for his next lanreotide shot.   Ledell Peoples, MD Department of Hematology/Oncology [place] at [facility] Phone: [phone] Pager: [phone] Email: [email or web address]

## 2022-02-03 ENCOUNTER — Telehealth: Payer: Self-pay | Admitting: Physician Assistant

## 2022-02-03 DIAGNOSIS — C7A8 Other malignant neuroendocrine tumors: Secondary | ICD-10-CM

## 2022-02-03 LAB — CHROMOGRANIN A: Chromogranin A (ng/mL): 159.7 ng/mL — ABNORMAL HIGH (ref 0.0–101.8)

## 2022-02-03 MED ORDER — APIXABAN 5 MG PO TABS: 5.0000 mg | ORAL_TABLET | Freq: Two times a day (BID) | ORAL | 5 refills | Status: DC

## 2022-02-03 NOTE — Telephone Encounter (Signed)
I called Mr. Daniel Gibson to review the PET scan images from 01/30/2022.  Findings suggest show soft tissue thickening involving the terminal ileum which is suspicious for underlying small bowel carcinoid as the primary.  There is evidence of metastatic disease involving the peritoneum and liver.  Additionally there is a small indeterminate small focus within the subcortical white matter of the right parietal lobe.  We would like further evaluation with an MRI of the brain which will be ordered.  We will forward the results to Dr. Tresa Moore from [facility] urology to further evaluate the right kidney lesion.  Patient will return in 4 weeks for a follow-up visit before his next lanreotide injection.  Patient expressed understanding and satisfaction with the plan provided.

## 2022-02-04 ENCOUNTER — Encounter: Payer: Self-pay | Admitting: Hematology and Oncology

## 2022-02-17 ENCOUNTER — Ambulatory Visit (HOSPITAL_COMMUNITY)
Admission: RE | Admit: 2022-02-17 | Discharge: 2022-02-17 | Disposition: A | Discharge status: Home / Self Care | Payer: Medicare HMO | Source: Ambulatory Visit | Attending: Physician Assistant | Admitting: Physician Assistant

## 2022-02-17 DIAGNOSIS — G319 Degenerative disease of nervous system, unspecified: Secondary | ICD-10-CM | POA: Diagnosis not present

## 2022-02-17 DIAGNOSIS — C7A8 Other malignant neuroendocrine tumors: Secondary | ICD-10-CM | POA: Diagnosis not present

## 2022-02-17 DIAGNOSIS — C7B8 Other secondary neuroendocrine tumors: Secondary | ICD-10-CM | POA: Insufficient documentation

## 2022-02-17 MED ORDER — GADOBUTROL 1 MMOL/ML IV SOLN
9.0000 mL | Freq: Once | INTRAVENOUS | Status: AC | PRN
  Administered 2022-02-17: 9 mL via INTRAVENOUS

## 2022-02-20 ENCOUNTER — Telehealth: Payer: Self-pay

## 2022-02-20 NOTE — Telephone Encounter (Signed)
Pt's wife, Juliene Pina, advised with VU

## 2022-02-20 NOTE — Telephone Encounter (Signed)
-----   Message from Lincoln Brigham, PA-C sent at 02/20/2022  3:03 PM EDT ----- Bonnita Nasuti, Please notify patient that MRI brain did not show any evidence of metastatic disease.

## 2022-03-01 ENCOUNTER — Other Ambulatory Visit: Payer: Self-pay

## 2022-03-01 ENCOUNTER — Inpatient Hospital Stay: Payer: Medicare HMO

## 2022-03-01 ENCOUNTER — Inpatient Hospital Stay: Payer: Medicare HMO | Attending: Physician Assistant

## 2022-03-01 VITALS — BP 145/75 | HR 66 | Temp 98.3°F | Resp 16

## 2022-03-01 DIAGNOSIS — C7A8 Other malignant neuroendocrine tumors: Secondary | ICD-10-CM

## 2022-03-01 LAB — CBC WITH DIFFERENTIAL (CANCER CENTER ONLY)
Abs Immature Granulocytes: 0.02 10*3/uL (ref 0.00–0.07)
Basophils Absolute: 0.1 10*3/uL (ref 0.0–0.1)
Basophils Relative: 1 %
Eosinophils Absolute: 0.7 10*3/uL — ABNORMAL HIGH (ref 0.0–0.5)
Eosinophils Relative: 8 %
HCT: 41.2 % (ref 39.0–52.0)
Hemoglobin: 14.9 g/dL (ref 13.0–17.0)
Immature Granulocytes: 0 %
Lymphocytes Relative: 9 %
Lymphs Abs: 0.8 10*3/uL (ref 0.7–4.0)
MCH: 30.8 pg (ref 26.0–34.0)
MCHC: 36.2 g/dL — ABNORMAL HIGH (ref 30.0–36.0)
MCV: 85.3 fL (ref 80.0–100.0)
Monocytes Absolute: 0.9 10*3/uL (ref 0.1–1.0)
Monocytes Relative: 10 %
Neutro Abs: 6.2 10*3/uL (ref 1.7–7.7)
Neutrophils Relative %: 72 %
Platelet Count: 148 10*3/uL — ABNORMAL LOW (ref 150–400)
RBC: 4.83 MIL/uL (ref 4.22–5.81)
RDW: 11.9 % (ref 11.5–15.5)
WBC Count: 8.6 10*3/uL (ref 4.0–10.5)
nRBC: 0 % (ref 0.0–0.2)

## 2022-03-01 LAB — CMP (CANCER CENTER ONLY)
ALT: 10 U/L (ref 0–44)
AST: 12 U/L — ABNORMAL LOW (ref 15–41)
Albumin: 4.4 g/dL (ref 3.5–5.0)
Alkaline Phosphatase: 68 U/L (ref 38–126)
Anion gap: 4 — ABNORMAL LOW (ref 5–15)
BUN: 10 mg/dL (ref 8–23)
CO2: 29 mmol/L (ref 22–32)
Calcium: 9.4 mg/dL (ref 8.9–10.3)
Chloride: 103 mmol/L (ref 98–111)
Creatinine: 1.27 mg/dL — ABNORMAL HIGH (ref 0.61–1.24)
GFR, Estimated: >60 mL/min (ref >60)
Glucose, Bld: 172 mg/dL — ABNORMAL HIGH (ref 70–99)
Potassium: 4 mmol/L (ref 3.5–5.1)
Sodium: 136 mmol/L (ref 135–145)
Total Bilirubin: 0.8 mg/dL (ref 0.3–1.2)
Total Protein: 6.6 g/dL (ref 6.5–8.1)

## 2022-03-01 MED ORDER — LANREOTIDE ACETATE 120 MG/0.5ML ~~LOC~~ SOLN
120.0000 mg | Freq: Once | SUBCUTANEOUS | Status: AC
  Administered 2022-03-01: 120 mg via SUBCUTANEOUS
  Filled 2022-03-01: qty 120

## 2022-03-02 LAB — CHROMOGRANIN A: Chromogranin A (ng/mL): 76.2 ng/mL (ref 0.0–101.8)

## 2022-03-06 DIAGNOSIS — C61 Malignant neoplasm of prostate: Secondary | ICD-10-CM | POA: Diagnosis not present

## 2022-03-06 DIAGNOSIS — D49511 Neoplasm of unspecified behavior of right kidney: Secondary | ICD-10-CM | POA: Diagnosis not present

## 2022-03-19 ENCOUNTER — Other Ambulatory Visit: Payer: Self-pay | Admitting: Cardiology

## 2022-03-29 ENCOUNTER — Inpatient Hospital Stay: Payer: Medicare HMO

## 2022-03-29 ENCOUNTER — Other Ambulatory Visit: Payer: Self-pay

## 2022-03-29 ENCOUNTER — Inpatient Hospital Stay: Payer: Medicare HMO | Attending: Physician Assistant

## 2022-03-29 DIAGNOSIS — C7A8 Other malignant neuroendocrine tumors: Secondary | ICD-10-CM

## 2022-03-29 LAB — CMP (CANCER CENTER ONLY)
ALT: 8 U/L (ref 0–44)
AST: 10 U/L — ABNORMAL LOW (ref 15–41)
Albumin: 3.8 g/dL (ref 3.5–5.0)
Alkaline Phosphatase: 70 U/L (ref 38–126)
Anion gap: 4 — ABNORMAL LOW (ref 5–15)
BUN: 11 mg/dL (ref 8–23)
CO2: 28 mmol/L (ref 22–32)
Calcium: 8.5 mg/dL — ABNORMAL LOW (ref 8.9–10.3)
Chloride: 103 mmol/L (ref 98–111)
Creatinine: 1.18 mg/dL (ref 0.61–1.24)
GFR, Estimated: >60 mL/min (ref >60)
Glucose, Bld: 262 mg/dL — ABNORMAL HIGH (ref 70–99)
Potassium: 3.7 mmol/L (ref 3.5–5.1)
Sodium: 135 mmol/L (ref 135–145)
Total Bilirubin: 0.9 mg/dL (ref 0.3–1.2)
Total Protein: 5.5 g/dL — ABNORMAL LOW (ref 6.5–8.1)

## 2022-03-29 LAB — CBC WITH DIFFERENTIAL (CANCER CENTER ONLY)
Abs Immature Granulocytes: 0.03 10*3/uL (ref 0.00–0.07)
Basophils Absolute: 0.1 10*3/uL (ref 0.0–0.1)
Basophils Relative: 1 %
Eosinophils Absolute: 0.4 10*3/uL (ref 0.0–0.5)
Eosinophils Relative: 5 %
HCT: 36.9 % — ABNORMAL LOW (ref 39.0–52.0)
Hemoglobin: 13.6 g/dL (ref 13.0–17.0)
Immature Granulocytes: 0 %
Lymphocytes Relative: 7 %
Lymphs Abs: 0.5 10*3/uL — ABNORMAL LOW (ref 0.7–4.0)
MCH: 31.3 pg (ref 26.0–34.0)
MCHC: 36.9 g/dL — ABNORMAL HIGH (ref 30.0–36.0)
MCV: 84.8 fL (ref 80.0–100.0)
Monocytes Absolute: 0.7 10*3/uL (ref 0.1–1.0)
Monocytes Relative: 9 %
Neutro Abs: 5.9 10*3/uL (ref 1.7–7.7)
Neutrophils Relative %: 78 %
Platelet Count: 137 10*3/uL — ABNORMAL LOW (ref 150–400)
RBC: 4.35 MIL/uL (ref 4.22–5.81)
RDW: 12 % (ref 11.5–15.5)
WBC Count: 7.5 10*3/uL (ref 4.0–10.5)
nRBC: 0 % (ref 0.0–0.2)

## 2022-03-29 MED ORDER — LANREOTIDE ACETATE 120 MG/0.5ML ~~LOC~~ SOLN
120.0000 mg | Freq: Once | SUBCUTANEOUS | Status: AC
  Administered 2022-03-29: 120 mg via SUBCUTANEOUS
  Filled 2022-03-29: qty 120

## 2022-04-05 DIAGNOSIS — Z951 Presence of aortocoronary bypass graft: Secondary | ICD-10-CM | POA: Diagnosis not present

## 2022-04-05 DIAGNOSIS — I252 Old myocardial infarction: Secondary | ICD-10-CM | POA: Diagnosis not present

## 2022-04-05 DIAGNOSIS — S6992XA Unspecified injury of left wrist, hand and finger(s), initial encounter: Secondary | ICD-10-CM | POA: Diagnosis not present

## 2022-04-05 DIAGNOSIS — S61022A Laceration with foreign body of left thumb without damage to nail, initial encounter: Secondary | ICD-10-CM | POA: Diagnosis not present

## 2022-04-05 DIAGNOSIS — I2510 Atherosclerotic heart disease of native coronary artery without angina pectoris: Secondary | ICD-10-CM | POA: Diagnosis not present

## 2022-04-05 DIAGNOSIS — Z7902 Long term (current) use of antithrombotics/antiplatelets: Secondary | ICD-10-CM | POA: Diagnosis not present

## 2022-04-05 DIAGNOSIS — S61112A Laceration without foreign body of left thumb with damage to nail, initial encounter: Secondary | ICD-10-CM | POA: Diagnosis not present

## 2022-04-05 DIAGNOSIS — Z888 Allergy status to other drugs, medicaments and biological substances status: Secondary | ICD-10-CM | POA: Diagnosis not present

## 2022-04-05 DIAGNOSIS — Z79899 Other long term (current) drug therapy: Secondary | ICD-10-CM | POA: Diagnosis not present

## 2022-04-05 DIAGNOSIS — S6702XA Crushing injury of left thumb, initial encounter: Secondary | ICD-10-CM | POA: Diagnosis not present

## 2022-04-05 DIAGNOSIS — Z7984 Long term (current) use of oral hypoglycemic drugs: Secondary | ICD-10-CM | POA: Diagnosis not present

## 2022-04-05 DIAGNOSIS — Z91014 Allergy to mammalian meats: Secondary | ICD-10-CM | POA: Diagnosis not present

## 2022-04-05 DIAGNOSIS — Z887 Allergy status to serum and vaccine status: Secondary | ICD-10-CM | POA: Diagnosis not present

## 2022-04-05 DIAGNOSIS — I10 Essential (primary) hypertension: Secondary | ICD-10-CM | POA: Diagnosis not present

## 2022-04-05 DIAGNOSIS — Z8546 Personal history of malignant neoplasm of prostate: Secondary | ICD-10-CM | POA: Diagnosis not present

## 2022-04-05 DIAGNOSIS — W230XXA Caught, crushed, jammed, or pinched between moving objects, initial encounter: Secondary | ICD-10-CM | POA: Diagnosis not present

## 2022-04-05 DIAGNOSIS — Z6825 Body mass index (BMI) 25.0-25.9, adult: Secondary | ICD-10-CM | POA: Diagnosis not present

## 2022-04-05 DIAGNOSIS — Z7901 Long term (current) use of anticoagulants: Secondary | ICD-10-CM | POA: Diagnosis not present

## 2022-04-05 DIAGNOSIS — E119 Type 2 diabetes mellitus without complications: Secondary | ICD-10-CM | POA: Diagnosis not present

## 2022-04-05 DIAGNOSIS — Z91018 Allergy to other foods: Secondary | ICD-10-CM | POA: Diagnosis not present

## 2022-04-14 DIAGNOSIS — L821 Other seborrheic keratosis: Secondary | ICD-10-CM | POA: Diagnosis not present

## 2022-04-14 DIAGNOSIS — D2261 Melanocytic nevi of right upper limb, including shoulder: Secondary | ICD-10-CM | POA: Diagnosis not present

## 2022-04-14 DIAGNOSIS — L900 Lichen sclerosus et atrophicus: Secondary | ICD-10-CM | POA: Diagnosis not present

## 2022-04-14 DIAGNOSIS — L814 Other melanin hyperpigmentation: Secondary | ICD-10-CM | POA: Diagnosis not present

## 2022-04-14 DIAGNOSIS — D2262 Melanocytic nevi of left upper limb, including shoulder: Secondary | ICD-10-CM | POA: Diagnosis not present

## 2022-04-14 DIAGNOSIS — D225 Melanocytic nevi of trunk: Secondary | ICD-10-CM | POA: Diagnosis not present

## 2022-04-16 ENCOUNTER — Encounter: Payer: Self-pay | Admitting: Physician Assistant

## 2022-04-26 ENCOUNTER — Inpatient Hospital Stay (HOSPITAL_BASED_OUTPATIENT_CLINIC_OR_DEPARTMENT_OTHER): Payer: Medicare HMO | Admitting: Hematology and Oncology

## 2022-04-26 ENCOUNTER — Inpatient Hospital Stay: Payer: Medicare HMO

## 2022-04-26 ENCOUNTER — Inpatient Hospital Stay: Payer: Medicare HMO | Attending: Physician Assistant

## 2022-04-26 ENCOUNTER — Other Ambulatory Visit: Payer: Self-pay

## 2022-04-26 VITALS — BP 193/98 | HR 66 | Temp 97.7°F | Resp 14 | Wt 192.2 lb

## 2022-04-26 DIAGNOSIS — C7A8 Other malignant neuroendocrine tumors: Secondary | ICD-10-CM | POA: Insufficient documentation

## 2022-04-26 DIAGNOSIS — C7B8 Other secondary neuroendocrine tumors: Secondary | ICD-10-CM | POA: Insufficient documentation

## 2022-04-26 DIAGNOSIS — K669 Disorder of peritoneum, unspecified: Secondary | ICD-10-CM | POA: Diagnosis not present

## 2022-04-26 LAB — CMP (CANCER CENTER ONLY)
ALT: 9 U/L (ref 0–44)
AST: 10 U/L — ABNORMAL LOW (ref 15–41)
Albumin: 4.2 g/dL (ref 3.5–5.0)
Alkaline Phosphatase: 70 U/L (ref 38–126)
Anion gap: 5 (ref 5–15)
BUN: 12 mg/dL (ref 8–23)
CO2: 25 mmol/L (ref 22–32)
Calcium: 9 mg/dL (ref 8.9–10.3)
Chloride: 104 mmol/L (ref 98–111)
Creatinine: 1.15 mg/dL (ref 0.61–1.24)
GFR, Estimated: >60 mL/min (ref >60)
Glucose, Bld: 208 mg/dL — ABNORMAL HIGH (ref 70–99)
Potassium: 4.2 mmol/L (ref 3.5–5.1)
Sodium: 134 mmol/L — ABNORMAL LOW (ref 135–145)
Total Bilirubin: 0.6 mg/dL (ref 0.3–1.2)
Total Protein: 6.6 g/dL (ref 6.5–8.1)

## 2022-04-26 LAB — CBC WITH DIFFERENTIAL (CANCER CENTER ONLY)
Abs Immature Granulocytes: 0.02 10*3/uL (ref 0.00–0.07)
Basophils Absolute: 0.1 10*3/uL (ref 0.0–0.1)
Basophils Relative: 1 %
Eosinophils Absolute: 0.6 10*3/uL — ABNORMAL HIGH (ref 0.0–0.5)
Eosinophils Relative: 8 %
HCT: 40.1 % (ref 39.0–52.0)
Hemoglobin: 14.6 g/dL (ref 13.0–17.0)
Immature Granulocytes: 0 %
Lymphocytes Relative: 6 %
Lymphs Abs: 0.5 10*3/uL — ABNORMAL LOW (ref 0.7–4.0)
MCH: 30.9 pg (ref 26.0–34.0)
MCHC: 36.4 g/dL — ABNORMAL HIGH (ref 30.0–36.0)
MCV: 84.8 fL (ref 80.0–100.0)
Monocytes Absolute: 0.6 10*3/uL (ref 0.1–1.0)
Monocytes Relative: 8 %
Neutro Abs: 6.1 10*3/uL (ref 1.7–7.7)
Neutrophils Relative %: 77 %
Platelet Count: 141 10*3/uL — ABNORMAL LOW (ref 150–400)
RBC: 4.73 MIL/uL (ref 4.22–5.81)
RDW: 13.1 % (ref 11.5–15.5)
WBC Count: 7.9 10*3/uL (ref 4.0–10.5)
nRBC: 0 % (ref 0.0–0.2)

## 2022-04-26 MED ORDER — LANREOTIDE ACETATE 120 MG/0.5ML ~~LOC~~ SOLN
120.0000 mg | Freq: Once | SUBCUTANEOUS | Status: AC
  Administered 2022-04-26: 120 mg via SUBCUTANEOUS
  Filled 2022-04-26: qty 120

## 2022-04-26 NOTE — Progress Notes (Signed)
[facility] [phone]   [phone]  PROGRESS NOTE:  Patient Care Team: Leonides Sake, MD as PCP - General (Family Medicine) Jacolyn Reedy, MD as Attending Physician (Cardiology) Arloa Koh, MD (Inactive) as Attending Physician (Radiation Oncology) Rana Snare, MD (Inactive) as Attending Physician (Urology)  CHIEF COMPLAINTS/PURPOSE OF CONSULTATION:  Right renal mass and peritoneal nodularity  ONCOLOGIC HISTORY: Presented to the [facility] ER on 12/11/2021 for painless hematuria and urinary urgency.  Work-up included urinalysis showed large leukocyte esterase, blood, protein, positive nitrates.  Urine culture grew E. coli.  Patient was treated with cefdinir.  He underwent CT imaging that showed a nonobstructing 6 mm stone in the interpolar left kidney, indeterminate 2.9 x 3.4 x 4.3 cm lesion in the upper pole right kidney, multiple peritoneal and omental nodules concerning for peritoneal carcinomatosis. 01/10/2022: CT chest: Incidental right lower lobe segmental and subsegmental pulmonary emboli.Minimal clot burden without right heart strain.No evidence of intrathoracic metastases.Small focus of ground-glass airspace disease within the right upper lobe likely inflammatory or infectious. Started on Eliquis therapy 01/18/2022: CT guided biopsy of peritoneal mass: Well-differentiated low-grade neuroendocrine neoplasm with features  suspicious carcinoid tumor. Ki-67 shows very low proliferative index score.  01/30/2022: PET DOTATATE showed extensive tracer avid peritoneal metastasis and tracer avid liver metastasis to the posterior right hepatic lobe. 02/01/2022: Start lanreotide monthly injections  INTERIM HISTORY:  Daniel Gibson returns today for a follow up visit after recent diagnosis of low grade neuroendocrine carcinoma. He is accompanied by his wife for this visit.   On exam today Daniel Gibson reports he is tolerating the lanreotide  shots quite well.  He reports he does have some occasional nausea but no vomiting or diarrhea.  He reports his energy is good and his appetite is okay.  He reports that it is "on and off".  He notes he is not having any abdominal pain.  He also notices no swelling of his abdomen or his lower extremities.  He reports his weight is overall steady though he is down about 4 pounds since August.  He notes that unfortunately did smash 2 of his fingers between steel rollers and it did take the nail off.  This is currently healing well.  He notes that even though he was on Eliquis he was able to stop the bleeding without much difficulty.  He also reports he did not take his blood pressure medications this morning.  He is not having any headache, chest pain, or vision changes.  He denies fevers, chills,sweats, shortness of breath, chest pain, cough, flushing or palpitations. He has no other complaints. Rest of the 10 point ROS is below.   MEDICAL HISTORY:  Past Medical History:  Diagnosis Date   Carotid artery disease ([place]) 05/08/2012   CEA on left 2013   Coronary artery disease    Diabetes mellitus without complication ([place])    Food allergy    Alpha Gal allergy    Hyperlipidemia    Hypertensive heart disease without CHF 04/08/2018   Myocardial infarction ([place]) 1993   medically managed   Obesity (BMI 30-39.9) 04/08/2018   Prostate cancer ([place]) 08/07/2016   PVC's (premature ventricular contractions) 04/08/2018   Thrombocytopenia ([place])     SURGICAL HISTORY: Past Surgical History:  Procedure Laterality Date   APPENDECTOMY     CAROTID ENDARTERECTOMY Right 06-07-12   cea   CORONARY ARTERY BYPASS GRAFT N/A 10/18/2020   Procedure: CORONARY ARTERY BYPASS GRAFTING (CABG)x3. LEFT INTERNAL MAMMARY ARTERY.  RIGHT ENDOSCOPIC SAPHENOUS VEIN HARVESTING.;  Surgeon: Wonda Olds, MD;  Location: [place];  Service: Open Heart Surgery;  Laterality: N/A;   ENDARTERECTOMY  06/07/2012   Procedure: ENDARTERECTOMY  CAROTID;  Surgeon: Angelia Mould, MD;  Location: [facility] OR;  Service: Vascular;  Laterality: Right;  Right Carotid Endarterectomy with Patch Angioplasty   LEFT HEART CATH AND CORONARY ANGIOGRAPHY N/A 10/07/2020   Procedure: LEFT HEART CATH AND CORONARY ANGIOGRAPHY;  Surgeon: Troy Sine, MD;  Location: [place] [facility];  Service: Cardiovascular;  Laterality: N/A;   TEE WITHOUT CARDIOVERSION N/A 10/18/2020   Procedure: TRANSESOPHAGEAL ECHOCARDIOGRAM (TEE);  Surgeon: Wonda Olds, MD;  Location: [place];  Service: Open Heart Surgery;  Laterality: N/A;   TRANSPERINEAL IMPLANT OF RADIATION SEEDS W/ ULTRASOUND      SOCIAL HISTORY: Social History   Socioeconomic History   Marital status: Married    Spouse name: Not on file   Number of children: Not on file   Years of education: Not on file   Highest education level: Not on file  Occupational History   Not on file  Tobacco Use   Smoking status: Never   Smokeless tobacco: Never  Vaping Use   Vaping Use: Never used  Substance and Sexual Activity   Alcohol use: No   Drug use: No   Sexual activity: Not on file  Other Topics Concern   Not on file  Social History Narrative   Not on file   Social Determinants of Health   Financial Resource Strain: Not on file  Food Insecurity: Not on file  Transportation Needs: Not on file  Physical Activity: Not on file  Stress: Not on file  Social Connections: Not on file  Intimate Partner Violence: Not on file    FAMILY HISTORY: Family History  Problem Relation Age of Onset   Cancer Mother    Hyperlipidemia Mother    Hypertension Mother    Heart attack Father    Heart disease Father        Heart Disease before age 48   Hyperlipidemia Father    Hypertension Father    Diabetes Sister    Heart disease Sister        before age 81   Hypertension Sister    Hyperlipidemia Sister    Diabetes Brother    Heart disease Brother        before age 34   Hyperlipidemia Brother     Hypertension Brother     ALLERGIES:  is allergic to ace inhibitors, albumin (human), beef-derived products, gelfoam-jmi sponge, heparin, pork-derived products, sheep-derived products, and tetanus toxoids.  MEDICATIONS:  Current Outpatient Medications  Medication Sig Dispense Refill   amLODipine (NORVASC) 10 MG tablet Take 10 mg by mouth in the morning.     apixaban (ELIQUIS) 5 MG TABS tablet Take 1 tablet (5 mg total) by mouth 2 (two) times daily. 60 tablet 5   carvedilol (COREG) 25 MG tablet Take 12.5 mg by mouth 2 (two) times daily with a meal.     EPINEPHrine (EPIPEN 2-PAK) 0.3 mg/0.3 mL IJ SOAJ injection Use as directed for life-threatening allergic reaction. 2 each 3   ezetimibe (ZETIA) 10 MG tablet TAKE 1 TABLET BY MOUTH EVERY DAY 90 tablet 3   metFORMIN (GLUCOPHAGE) 1000 MG tablet Take 500 mg by mouth in the morning and at bedtime.     oxybutynin (DITROPAN) 5 MG tablet Take 5 mg by mouth. (Patient not taking: Reported on 02/01/2022)  pantoprazole (PROTONIX) 40 MG tablet TAKE 1 TABLET BY MOUTH EVERY DAY 90 tablet 3   Polyethylene Glycol 3350 (MIRALAX PO) Take 1 Package by mouth as needed (constipation).     rosuvastatin (CRESTOR) 40 MG tablet Take 1 tablet (40 mg total) by mouth daily. 90 tablet 2   Tamsulosin HCl (FLOMAX) 0.4 MG CAPS Take 0.8 mg by mouth at bedtime.     traZODone (DESYREL) 50 MG tablet Take 50 mg by mouth at bedtime.     TROSPIUM CHLORIDE PO Take 60 mg by mouth daily.     No current facility-administered medications for this visit.    REVIEW OF SYSTEMS:   Constitutional: ( - ) fevers, ( - )  chills , ( - ) night sweats Eyes: ( - ) blurriness of vision, ( - ) double vision, ( - ) watery eyes Ears, nose, mouth, throat, and face: ( - ) mucositis, ( - ) sore throat Respiratory: ( - ) cough, ( - ) dyspnea, ( - ) wheezes Cardiovascular: ( - ) palpitation, ( - ) chest discomfort, ( - ) lower extremity swelling Gastrointestinal:  ( - ) nausea, ( - ) heartburn, ( - )  change in bowel habits Skin: ( - ) abnormal skin rashes Lymphatics: ( - ) new lymphadenopathy, ( - ) easy bruising Neurological: ( - ) numbness, ( - ) tingling, ( - ) new weaknesses Behavioral/Psych: ( - ) mood change, ( - ) new changes  All other systems were reviewed with the patient and are negative.  PHYSICAL EXAMINATION: ECOG PERFORMANCE STATUS: 0 - Asymptomatic  Vitals:   04/26/22 1335  BP: (!) 193/98  Pulse: 66  Resp: 14  Temp: 97.7 F (36.5 C)  SpO2: 97%   Filed Weights   04/26/22 1335  Weight: 192 lb 3.2 oz (87.2 kg)    GENERAL: well appearing male in NAD  SKIN: skin color, texture, turgor are normal, no rashes or significant lesions EYES: conjunctiva are pink and non-injected, sclera clear LUNGS: clear to auscultation and percussion with normal breathing effort HEART: regular rate & rhythm and no murmurs and no lower extremity edema Musculoskeletal: no cyanosis of digits and no clubbing  PSYCH: alert & oriented x 3, fluent speech NEURO: no focal motor/sensory deficits  LABORATORY DATA:  I have reviewed the data as listed    Latest Ref Rng & Units 04/26/2022    1:21 PM 03/29/2022    1:19 PM 03/01/2022   12:37 PM  CBC  WBC 4.0 - 10.5 K/uL 7.9  7.5  8.6   Hemoglobin 13.0 - 17.0 g/dL 14.6  13.6  14.9   Hematocrit 39.0 - 52.0 % 40.1  36.9  41.2   Platelets 150 - 400 K/uL 141  137  148        Latest Ref Rng & Units 04/26/2022    1:21 PM 03/29/2022    1:19 PM 03/01/2022   12:37 PM  CMP  Glucose 70 - 99 mg/dL 208  262  172   BUN 8 - 23 mg/dL _0 Creatinine 0.61 - 1.24 mg/dL 1.15  1.18  1.27   Sodium 135 - 145 mmol/L 134  135  136   Potassium 3.5 - 5.1 mmol/L 4.2  3.7  4.0   Chloride 98 - 111 mmol/L 104  103  103   CO2 22 - 32 mmol/L _1 Calcium 8.9 - 10.3 mg/dL 9.0  8.5  9.4  Total Protein 6.5 - 8.1 g/dL 6.6  5.5  6.6   Total Bilirubin 0.3 - 1.2 mg/dL 0.6  0.9  0.8   Alkaline Phos 38 - 126 U/L 70  70  68   AST 15 - 41 U/L _0 ALT  0 - 44 U/L _1 RADIOGRAPHIC STUDIES: I have personally reviewed the radiological images as listed and agreed with the findings in the report. No results found.  ASSESSMENT & PLAN KASSIM GUERTIN is a 71 y.o. male who presents for a follow up for newly diagnosed well differentiated neuroendocrine carcinoma, low grade.   #Well differentiated neuroendocrine carcinoma, low grade: --CT guided peritoneal mass biopsy on 01/18/2022 confirmed above diagnosis. --PET DOTATATE was completed on 01/30/2022, report pending --Recommend monthly lanreotide injection q 28 days. Reviewed dose, frequency and common side effects including injection site reaction, cholelithiasis, gallbladder sludge, hyperglycemia, hypoglycemia, thyroid dysfunction and bradycardia.  --Labs from today were reviewed and require no intervention. Baseline Chromogranin levels were 159.7 on 02/01/2022.  PLAN: --Continue with monthly lanreotide injection q 28 days. --CBC and CMP every month before treatment. Chromagranin levels every 3 months --labs today show creatinine 1.15, AST 10, ALT 9, white blood cell 7.9, hemoglobin 14.6, MCV 84.8, and platelets of 141. --chromogranin A last 76.2 on 03/01/2022.  --RTC in 3 months for a toxicity evaluation   No orders of the defined types were placed in this encounter.   All questions were answered. The patient knows to call the clinic with any problems, questions or concerns.  I have spent a total of 30 minutes minutes of face-to-face and non-face-to-face time, preparing to see the patient,  performing a medically appropriate examination, counseling and educating the patient, ordering tests/procedures,documenting clinical information in the electronic health record, independently interpreting results and communicating results to the patient, and care coordination.  Ledell Peoples, MD Department of Hematology/Oncology [place] [facility] Phone:  [phone] Pager: [phone] Email: [email or web address]

## 2022-05-08 ENCOUNTER — Telehealth: Payer: Self-pay | Admitting: Hematology and Oncology

## 2022-05-08 ENCOUNTER — Encounter: Payer: Self-pay | Admitting: Hematology and Oncology

## 2022-05-08 NOTE — Telephone Encounter (Signed)
Called patient per 11/13 in basket. Rescheduled patient, phone disconnected. Mailing calendar

## 2022-05-19 ENCOUNTER — Other Ambulatory Visit (HOSPITAL_COMMUNITY): Payer: Self-pay

## 2022-05-22 ENCOUNTER — Other Ambulatory Visit: Payer: Self-pay | Admitting: Hematology and Oncology

## 2022-05-22 ENCOUNTER — Telehealth: Payer: Self-pay | Admitting: *Deleted

## 2022-05-22 DIAGNOSIS — E1129 Type 2 diabetes mellitus with other diabetic kidney complication: Secondary | ICD-10-CM | POA: Diagnosis not present

## 2022-05-22 DIAGNOSIS — T783XXA Angioneurotic edema, initial encounter: Secondary | ICD-10-CM | POA: Diagnosis not present

## 2022-05-22 DIAGNOSIS — Z79899 Other long term (current) drug therapy: Secondary | ICD-10-CM | POA: Diagnosis not present

## 2022-05-22 DIAGNOSIS — I10 Essential (primary) hypertension: Secondary | ICD-10-CM | POA: Diagnosis not present

## 2022-05-22 DIAGNOSIS — C7A8 Other malignant neuroendocrine tumors: Secondary | ICD-10-CM

## 2022-05-22 DIAGNOSIS — I2510 Atherosclerotic heart disease of native coronary artery without angina pectoris: Secondary | ICD-10-CM | POA: Diagnosis not present

## 2022-05-22 NOTE — Telephone Encounter (Signed)
Received call from pt's wife inquiring about when pt's next scan is due. She thinks it was to be done in November.   Please advise

## 2022-05-22 NOTE — Telephone Encounter (Signed)
Received call from pt's wife regarding next CT scan. Discussed with Dr. Lorenso Courier. He placed order for scan today. Wife made aware to expect a call from central radiology scheduling.

## 2022-05-24 ENCOUNTER — Other Ambulatory Visit: Payer: Self-pay

## 2022-05-24 ENCOUNTER — Ambulatory Visit (HOSPITAL_COMMUNITY)
Admission: RE | Admit: 2022-05-24 | Discharge: 2022-05-24 | Disposition: A | Discharge status: Home / Self Care | Payer: Medicare HMO | Source: Ambulatory Visit | Attending: Hematology and Oncology | Admitting: Hematology and Oncology

## 2022-05-24 ENCOUNTER — Inpatient Hospital Stay: Payer: Medicare HMO

## 2022-05-24 VITALS — BP 132/68 | HR 64 | Resp 18

## 2022-05-24 DIAGNOSIS — C7A8 Other malignant neuroendocrine tumors: Secondary | ICD-10-CM | POA: Insufficient documentation

## 2022-05-24 DIAGNOSIS — R911 Solitary pulmonary nodule: Secondary | ICD-10-CM | POA: Diagnosis not present

## 2022-05-24 DIAGNOSIS — Z8505 Personal history of malignant neoplasm of liver: Secondary | ICD-10-CM | POA: Diagnosis not present

## 2022-05-24 DIAGNOSIS — C786 Secondary malignant neoplasm of retroperitoneum and peritoneum: Secondary | ICD-10-CM | POA: Diagnosis not present

## 2022-05-24 DIAGNOSIS — C787 Secondary malignant neoplasm of liver and intrahepatic bile duct: Secondary | ICD-10-CM | POA: Diagnosis not present

## 2022-05-24 DIAGNOSIS — C7A1 Malignant poorly differentiated neuroendocrine tumors: Secondary | ICD-10-CM | POA: Diagnosis not present

## 2022-05-24 LAB — CBC WITH DIFFERENTIAL (CANCER CENTER ONLY)
Abs Immature Granulocytes: 0.02 10*3/uL (ref 0.00–0.07)
Basophils Absolute: 0.1 10*3/uL (ref 0.0–0.1)
Basophils Relative: 1 %
Eosinophils Absolute: 0.5 10*3/uL (ref 0.0–0.5)
Eosinophils Relative: 7 %
HCT: 40.2 % (ref 39.0–52.0)
Hemoglobin: 14.8 g/dL (ref 13.0–17.0)
Immature Granulocytes: 0 %
Lymphocytes Relative: 8 %
Lymphs Abs: 0.6 10*3/uL — ABNORMAL LOW (ref 0.7–4.0)
MCH: 31.6 pg (ref 26.0–34.0)
MCHC: 36.8 g/dL — ABNORMAL HIGH (ref 30.0–36.0)
MCV: 85.9 fL (ref 80.0–100.0)
Monocytes Absolute: 0.8 10*3/uL (ref 0.1–1.0)
Monocytes Relative: 10 %
Neutro Abs: 5.6 10*3/uL (ref 1.7–7.7)
Neutrophils Relative %: 74 %
Platelet Count: 147 10*3/uL — ABNORMAL LOW (ref 150–400)
RBC: 4.68 MIL/uL (ref 4.22–5.81)
RDW: 13.5 % (ref 11.5–15.5)
WBC Count: 7.7 10*3/uL (ref 4.0–10.5)
nRBC: 0 % (ref 0.0–0.2)

## 2022-05-24 LAB — CMP (CANCER CENTER ONLY)
ALT: 8 U/L (ref 0–44)
AST: 11 U/L — ABNORMAL LOW (ref 15–41)
Albumin: 4.5 g/dL (ref 3.5–5.0)
Alkaline Phosphatase: 71 U/L (ref 38–126)
Anion gap: 7 (ref 5–15)
BUN: 13 mg/dL (ref 8–23)
CO2: 25 mmol/L (ref 22–32)
Calcium: 9.4 mg/dL (ref 8.9–10.3)
Chloride: 103 mmol/L (ref 98–111)
Creatinine: 1.37 mg/dL — ABNORMAL HIGH (ref 0.61–1.24)
GFR, Estimated: 55 mL/min — ABNORMAL LOW (ref >60)
Glucose, Bld: 286 mg/dL — ABNORMAL HIGH (ref 70–99)
Potassium: 3.8 mmol/L (ref 3.5–5.1)
Sodium: 135 mmol/L (ref 135–145)
Total Bilirubin: 0.9 mg/dL (ref 0.3–1.2)
Total Protein: 6.8 g/dL (ref 6.5–8.1)

## 2022-05-24 MED ORDER — LANREOTIDE ACETATE 120 MG/0.5ML ~~LOC~~ SOLN
120.0000 mg | Freq: Once | SUBCUTANEOUS | Status: AC
  Administered 2022-05-24: 120 mg via SUBCUTANEOUS
  Filled 2022-05-24: qty 120

## 2022-05-24 MED ORDER — IOHEXOL 300 MG/ML  SOLN
100.0000 mL | Freq: Once | INTRAMUSCULAR | Status: AC | PRN
  Administered 2022-05-24: 100 mL via INTRAVENOUS

## 2022-05-26 ENCOUNTER — Telehealth: Payer: Self-pay | Admitting: *Deleted

## 2022-05-26 DIAGNOSIS — E1129 Type 2 diabetes mellitus with other diabetic kidney complication: Secondary | ICD-10-CM | POA: Diagnosis not present

## 2022-05-26 DIAGNOSIS — G4700 Insomnia, unspecified: Secondary | ICD-10-CM | POA: Diagnosis not present

## 2022-05-26 DIAGNOSIS — E782 Mixed hyperlipidemia: Secondary | ICD-10-CM | POA: Diagnosis not present

## 2022-05-26 DIAGNOSIS — I10 Essential (primary) hypertension: Secondary | ICD-10-CM | POA: Diagnosis not present

## 2022-05-26 DIAGNOSIS — Z91018 Allergy to other foods: Secondary | ICD-10-CM | POA: Diagnosis not present

## 2022-05-26 DIAGNOSIS — R809 Proteinuria, unspecified: Secondary | ICD-10-CM | POA: Diagnosis not present

## 2022-05-26 LAB — CHROMOGRANIN A: Chromogranin A (ng/mL): 68 ng/mL (ref 0.0–101.8)

## 2022-05-26 NOTE — Telephone Encounter (Signed)
-----   Message from Orson Slick, MD sent at 05/25/2022  5:38 PM EST ----- Please let Daniel Gibson know that his CT scan showed stable disease.  This is a good result.  We will continue his current treatments.  Will plan to see him back in late December for his next shot in January for his next clinic visit. ----- Message ----- From: Interface, Rad Results In Sent: 05/25/2022   1:00 PM EST To: Orson Slick, MD

## 2022-05-26 NOTE — Telephone Encounter (Signed)
TCT patient regarding recent scan results.

## 2022-05-26 NOTE — Telephone Encounter (Signed)
No answer to this call. LVM for pt to return this call at his convenience to [phone]

## 2022-06-08 ENCOUNTER — Encounter: Payer: Self-pay | Admitting: Hematology and Oncology

## 2022-06-21 ENCOUNTER — Inpatient Hospital Stay: Payer: Medicare HMO

## 2022-06-21 ENCOUNTER — Other Ambulatory Visit: Payer: Self-pay

## 2022-06-21 ENCOUNTER — Inpatient Hospital Stay: Payer: Medicare HMO | Attending: Physician Assistant

## 2022-06-21 VITALS — BP 112/58 | HR 98 | Temp 99.4°F | Resp 18

## 2022-06-21 DIAGNOSIS — C7B8 Other secondary neuroendocrine tumors: Secondary | ICD-10-CM | POA: Insufficient documentation

## 2022-06-21 DIAGNOSIS — C7A8 Other malignant neuroendocrine tumors: Secondary | ICD-10-CM | POA: Diagnosis present

## 2022-06-21 LAB — CBC WITH DIFFERENTIAL (CANCER CENTER ONLY)
Abs Immature Granulocytes: 0.07 10*3/uL (ref 0.00–0.07)
Basophils Absolute: 0.1 10*3/uL (ref 0.0–0.1)
Basophils Relative: 1 %
Eosinophils Absolute: 0.2 10*3/uL (ref 0.0–0.5)
Eosinophils Relative: 2 %
HCT: 37.3 % — ABNORMAL LOW (ref 39.0–52.0)
Hemoglobin: 13.7 g/dL (ref 13.0–17.0)
Immature Granulocytes: 1 %
Lymphocytes Relative: 6 %
Lymphs Abs: 0.6 10*3/uL — ABNORMAL LOW (ref 0.7–4.0)
MCH: 31.6 pg (ref 26.0–34.0)
MCHC: 36.7 g/dL — ABNORMAL HIGH (ref 30.0–36.0)
MCV: 85.9 fL (ref 80.0–100.0)
Monocytes Absolute: 1.3 10*3/uL — ABNORMAL HIGH (ref 0.1–1.0)
Monocytes Relative: 13 %
Neutro Abs: 7.5 10*3/uL (ref 1.7–7.7)
Neutrophils Relative %: 77 %
Platelet Count: 173 10*3/uL (ref 150–400)
RBC: 4.34 MIL/uL (ref 4.22–5.81)
RDW: 12.9 % (ref 11.5–15.5)
WBC Count: 9.8 10*3/uL (ref 4.0–10.5)
nRBC: 0 % (ref 0.0–0.2)

## 2022-06-21 LAB — CMP (CANCER CENTER ONLY)
ALT: 11 U/L (ref 0–44)
AST: 13 U/L — ABNORMAL LOW (ref 15–41)
Albumin: 3.7 g/dL (ref 3.5–5.0)
Alkaline Phosphatase: 55 U/L (ref 38–126)
Anion gap: 4 — ABNORMAL LOW (ref 5–15)
BUN: 11 mg/dL (ref 8–23)
CO2: 29 mmol/L (ref 22–32)
Calcium: 9 mg/dL (ref 8.9–10.3)
Chloride: 100 mmol/L (ref 98–111)
Creatinine: 1.54 mg/dL — ABNORMAL HIGH (ref 0.61–1.24)
GFR, Estimated: 48 mL/min — ABNORMAL LOW (ref >60)
Glucose, Bld: 253 mg/dL — ABNORMAL HIGH (ref 70–99)
Potassium: 3.6 mmol/L (ref 3.5–5.1)
Sodium: 133 mmol/L — ABNORMAL LOW (ref 135–145)
Total Bilirubin: 1 mg/dL (ref 0.3–1.2)
Total Protein: 6.1 g/dL — ABNORMAL LOW (ref 6.5–8.1)

## 2022-06-21 MED ORDER — LANREOTIDE ACETATE 120 MG/0.5ML ~~LOC~~ SOLN
120.0000 mg | Freq: Once | SUBCUTANEOUS | Status: AC
  Administered 2022-06-21: 120 mg via SUBCUTANEOUS
  Filled 2022-06-21: qty 120

## 2022-07-17 ENCOUNTER — Inpatient Hospital Stay (HOSPITAL_BASED_OUTPATIENT_CLINIC_OR_DEPARTMENT_OTHER): Payer: Medicare HMO | Admitting: Hematology and Oncology

## 2022-07-17 ENCOUNTER — Inpatient Hospital Stay: Payer: Medicare HMO

## 2022-07-17 ENCOUNTER — Other Ambulatory Visit: Payer: Self-pay

## 2022-07-17 ENCOUNTER — Inpatient Hospital Stay: Payer: Medicare HMO | Attending: Physician Assistant

## 2022-07-17 VITALS — BP 179/91 | HR 72 | Temp 98.0°F | Resp 15 | Wt 175.0 lb

## 2022-07-17 DIAGNOSIS — C7B8 Other secondary neuroendocrine tumors: Secondary | ICD-10-CM | POA: Insufficient documentation

## 2022-07-17 DIAGNOSIS — C7A8 Other malignant neuroendocrine tumors: Secondary | ICD-10-CM

## 2022-07-17 LAB — CMP (CANCER CENTER ONLY)
ALT: 6 U/L (ref 0–44)
AST: 10 U/L — ABNORMAL LOW (ref 15–41)
Albumin: 4 g/dL (ref 3.5–5.0)
Alkaline Phosphatase: 68 U/L (ref 38–126)
Anion gap: 6 (ref 5–15)
BUN: 9 mg/dL (ref 8–23)
CO2: 26 mmol/L (ref 22–32)
Calcium: 9.1 mg/dL (ref 8.9–10.3)
Chloride: 103 mmol/L (ref 98–111)
Creatinine: 1.23 mg/dL (ref 0.61–1.24)
GFR, Estimated: >60 mL/min (ref >60)
Glucose, Bld: 247 mg/dL — ABNORMAL HIGH (ref 70–99)
Potassium: 4.2 mmol/L (ref 3.5–5.1)
Sodium: 135 mmol/L (ref 135–145)
Total Bilirubin: 0.8 mg/dL (ref 0.3–1.2)
Total Protein: 6.1 g/dL — ABNORMAL LOW (ref 6.5–8.1)

## 2022-07-17 LAB — CBC WITH DIFFERENTIAL (CANCER CENTER ONLY)
Abs Immature Granulocytes: 0.02 10*3/uL (ref 0.00–0.07)
Basophils Absolute: 0.1 10*3/uL (ref 0.0–0.1)
Basophils Relative: 1 %
Eosinophils Absolute: 0.3 10*3/uL (ref 0.0–0.5)
Eosinophils Relative: 4 %
HCT: 42.3 % (ref 39.0–52.0)
Hemoglobin: 14.8 g/dL (ref 13.0–17.0)
Immature Granulocytes: 0 %
Lymphocytes Relative: 6 %
Lymphs Abs: 0.5 10*3/uL — ABNORMAL LOW (ref 0.7–4.0)
MCH: 31.8 pg (ref 26.0–34.0)
MCHC: 35 g/dL (ref 30.0–36.0)
MCV: 90.8 fL (ref 80.0–100.0)
Monocytes Absolute: 0.6 10*3/uL (ref 0.1–1.0)
Monocytes Relative: 8 %
Neutro Abs: 6.3 10*3/uL (ref 1.7–7.7)
Neutrophils Relative %: 81 %
Platelet Count: 153 10*3/uL (ref 150–400)
RBC: 4.66 MIL/uL (ref 4.22–5.81)
RDW: 12.6 % (ref 11.5–15.5)
WBC Count: 7.8 10*3/uL (ref 4.0–10.5)
nRBC: 0 % (ref 0.0–0.2)

## 2022-07-17 MED ORDER — LANREOTIDE ACETATE 120 MG/0.5ML ~~LOC~~ SOLN
120.0000 mg | Freq: Once | SUBCUTANEOUS | Status: AC
  Administered 2022-07-17: 120 mg via SUBCUTANEOUS
  Filled 2022-07-17: qty 120

## 2022-07-17 NOTE — Progress Notes (Signed)
[place] [phone]   Fax:([phone]  PROGRESS NOTE:  Patient Care Team: Leonides Sake, MD as PCP - General (Family Medicine) Jacolyn Reedy, MD as Attending Physician (Cardiology) Arloa Koh, MD (Inactive) as Attending Physician (Radiation Oncology) Rana Snare, MD (Inactive) as Attending Physician (Urology)  CHIEF COMPLAINTS/PURPOSE OF CONSULTATION:  Right renal mass and peritoneal nodularity  ONCOLOGIC HISTORY: Presented to the [facility] ER on 12/11/2021 for painless hematuria and urinary urgency.  Work-up included urinalysis showed large leukocyte esterase, blood, protein, positive nitrates.  Urine culture grew E. coli.  Patient was treated with cefdinir.  He underwent CT imaging that showed a nonobstructing 6 mm stone in the interpolar left kidney, indeterminate 2.9 x 3.4 x 4.3 cm lesion in the upper pole right kidney, multiple peritoneal and omental nodules concerning for peritoneal carcinomatosis. 01/10/2022: CT chest: Incidental right lower lobe segmental and subsegmental pulmonary emboli.Minimal clot burden without right heart strain.No evidence of intrathoracic metastases.Small focus of ground-glass airspace disease within the right upper lobe likely inflammatory or infectious. Started on Eliquis therapy 01/18/2022: CT guided biopsy of peritoneal mass: Well-differentiated low-grade neuroendocrine neoplasm with features  suspicious carcinoid tumor. Ki-67 shows very low proliferative index score.  01/30/2022: PET DOTATATE showed extensive tracer avid peritoneal metastasis and tracer avid liver metastasis to the posterior right hepatic lobe. 02/01/2022: Start lanreotide monthly injections  INTERIM HISTORY:  Daniel Gibson returns today for a follow up visit after recent diagnosis of low grade neuroendocrine carcinoma. He is accompanied by his wife for this visit.   On exam today Daniel Gibson reports he has been doing well in the  interim since her last visit.  He reports that he was sick with a virus for about 7 days.  He reports he had nausea and no energy.  He did also have some vomiting.  He notes since then his symptoms have resolved on their own.  He has been eating well though his weight is currently down to 175 pounds compared to our last visit at 192 pounds.  He reports he is not having any shortness of breath, chest pain, lightheadedness, or dizziness.  He notes his bowels are moving regularly and he is not having any cramping with his shots.  He reports he has his usual chronic pain but no new symptoms.  Overall he is tolerating lanreotide shots well and is willing and able to proceed at this time.  He denies fevers, chills,sweats, shortness of breath, chest pain, cough, flushing or palpitations. He has no other complaints. Rest of the 10 point ROS is below.   MEDICAL HISTORY:  Past Medical History:  Diagnosis Date   Carotid artery disease ([place]) 05/08/2012   CEA on left 2013   Coronary artery disease    Diabetes mellitus without complication ([place])    Food allergy    Alpha Gal allergy    Hyperlipidemia    Hypertensive heart disease without CHF 04/08/2018   Myocardial infarction ([place]) 1993   medically managed   Obesity (BMI 30-39.9) 04/08/2018   Prostate cancer ([place]) 08/07/2016   PVC's (premature ventricular contractions) 04/08/2018   Thrombocytopenia ([place])     SURGICAL HISTORY: Past Surgical History:  Procedure Laterality Date   APPENDECTOMY     CAROTID ENDARTERECTOMY Right 06-07-12   cea   CORONARY ARTERY BYPASS GRAFT N/A 10/18/2020   Procedure: CORONARY ARTERY BYPASS GRAFTING (CABG)x3. LEFT INTERNAL MAMMARY ARTERY. RIGHT ENDOSCOPIC SAPHENOUS VEIN HARVESTING.;  Surgeon: Wonda Olds, MD;  Location: [facility]  OR;  Service: Open Heart Surgery;  Laterality: N/A;   ENDARTERECTOMY  06/07/2012   Procedure: ENDARTERECTOMY CAROTID;  Surgeon: Angelia Mould, MD;  Location: [facility] OR;  Service: Vascular;   Laterality: Right;  Right Carotid Endarterectomy with Patch Angioplasty   LEFT HEART CATH AND CORONARY ANGIOGRAPHY N/A 10/07/2020   Procedure: LEFT HEART CATH AND CORONARY ANGIOGRAPHY;  Surgeon: Troy Sine, MD;  Location: [place] CV LAB;  Service: Cardiovascular;  Laterality: N/A;   TEE WITHOUT CARDIOVERSION N/A 10/18/2020   Procedure: TRANSESOPHAGEAL ECHOCARDIOGRAM (TEE);  Surgeon: Wonda Olds, MD;  Location: [place];  Service: Open Heart Surgery;  Laterality: N/A;   TRANSPERINEAL IMPLANT OF RADIATION SEEDS W/ ULTRASOUND      SOCIAL HISTORY: Social History   Socioeconomic History   Marital status: Married    Spouse name: Not on file   Number of children: Not on file   Years of education: Not on file   Highest education level: Not on file  Occupational History   Not on file  Tobacco Use   Smoking status: Never   Smokeless tobacco: Never  Vaping Use   Vaping Use: Never used  Substance and Sexual Activity   Alcohol use: No   Drug use: No   Sexual activity: Not on file  Other Topics Concern   Not on file  Social History Narrative   Not on file   Social Determinants of Health   Financial Resource Strain: Not on file  Food Insecurity: Not on file  Transportation Needs: Not on file  Physical Activity: Not on file  Stress: Not on file  Social Connections: Not on file  Intimate Partner Violence: Not on file    FAMILY HISTORY: Family History  Problem Relation Age of Onset   Cancer Mother    Hyperlipidemia Mother    Hypertension Mother    Heart attack Father    Heart disease Father        Heart Disease before age 62   Hyperlipidemia Father    Hypertension Father    Diabetes Sister    Heart disease Sister        before age 76   Hypertension Sister    Hyperlipidemia Sister    Diabetes Brother    Heart disease Brother        before age 20   Hyperlipidemia Brother    Hypertension Brother     ALLERGIES:  is allergic to ace inhibitors, albumin (human),  beef-derived products, gelfoam-jmi sponge, heparin, pork-derived products, sheep-derived products, and tetanus toxoids.  MEDICATIONS:  Current Outpatient Medications  Medication Sig Dispense Refill   amLODipine (NORVASC) 10 MG tablet Take 10 mg by mouth in the morning.     apixaban (ELIQUIS) 5 MG TABS tablet Take 1 tablet (5 mg total) by mouth 2 (two) times daily. 60 tablet 5   carvedilol (COREG) 25 MG tablet Take 12.5 mg by mouth 2 (two) times daily with a meal.     EPINEPHrine (EPIPEN 2-PAK) 0.3 mg/0.3 mL IJ SOAJ injection Use as directed for life-threatening allergic reaction. 2 each 3   ezetimibe (ZETIA) 10 MG tablet TAKE 1 TABLET BY MOUTH EVERY DAY 90 tablet 3   metFORMIN (GLUCOPHAGE) 1000 MG tablet Take 500 mg by mouth in the morning and at bedtime.     oxybutynin (DITROPAN) 5 MG tablet Take 5 mg by mouth. (Patient not taking: Reported on 02/01/2022)     pantoprazole (PROTONIX) 40 MG tablet TAKE 1 TABLET BY MOUTH EVERY DAY  90 tablet 3   Polyethylene Glycol 3350 (MIRALAX PO) Take 1 Package by mouth as needed (constipation).     rosuvastatin (CRESTOR) 40 MG tablet Take 1 tablet (40 mg total) by mouth daily. 90 tablet 2   Tamsulosin HCl (FLOMAX) 0.4 MG CAPS Take 0.8 mg by mouth at bedtime.     traZODone (DESYREL) 50 MG tablet Take 50 mg by mouth at bedtime.     TROSPIUM CHLORIDE PO Take 60 mg by mouth daily.     No current facility-administered medications for this visit.    REVIEW OF SYSTEMS:   Constitutional: ( - ) fevers, ( - )  chills , ( - ) night sweats Eyes: ( - ) blurriness of vision, ( - ) double vision, ( - ) watery eyes Ears, nose, mouth, throat, and face: ( - ) mucositis, ( - ) sore throat Respiratory: ( - ) cough, ( - ) dyspnea, ( - ) wheezes Cardiovascular: ( - ) palpitation, ( - ) chest discomfort, ( - ) lower extremity swelling Gastrointestinal:  ( - ) nausea, ( - ) heartburn, ( - ) change in bowel habits Skin: ( - ) abnormal skin rashes Lymphatics: ( - ) new  lymphadenopathy, ( - ) easy bruising Neurological: ( - ) numbness, ( - ) tingling, ( - ) new weaknesses Behavioral/Psych: ( - ) mood change, ( - ) new changes  All other systems were reviewed with the patient and are negative.  PHYSICAL EXAMINATION: ECOG PERFORMANCE STATUS: 0 - Asymptomatic  Vitals:   07/17/22 1054  BP: (!) 179/91  Pulse: 72  Resp: 15  Temp: 98 F (36.7 C)  SpO2: 95%   Filed Weights   07/17/22 1054  Weight: 175 lb (79.4 kg)    GENERAL: well appearing male in NAD  SKIN: skin color, texture, turgor are normal, no rashes or significant lesions EYES: conjunctiva are pink and non-injected, sclera clear LUNGS: clear to auscultation and percussion with normal breathing effort HEART: regular rate & rhythm and no murmurs and no lower extremity edema Musculoskeletal: no cyanosis of digits and no clubbing  PSYCH: alert & oriented x 3, fluent speech NEURO: no focal motor/sensory deficits  LABORATORY DATA:  I have reviewed the data as listed    Latest Ref Rng & Units 07/17/2022   10:23 AM 06/21/2022    1:15 PM 05/24/2022    1:42 PM  CBC  WBC 4.0 - 10.5 K/uL 7.8  9.8  7.7   Hemoglobin 13.0 - 17.0 g/dL 14.8  13.7  14.8   Hematocrit 39.0 - 52.0 % 42.3  37.3  40.2   Platelets 150 - 400 K/uL 153  173  147        Latest Ref Rng & Units 07/17/2022   10:23 AM 06/21/2022    1:15 PM 05/24/2022    1:42 PM  CMP  Glucose 70 - 99 mg/dL 247  253  286   BUN 8 - 23 mg/dL '9  11  13   '$ Creatinine 0.61 - 1.24 mg/dL 1.23  1.54  1.37   Sodium 135 - 145 mmol/L 135  133  135   Potassium 3.5 - 5.1 mmol/L 4.2  3.6  3.8   Chloride 98 - 111 mmol/L 103  100  103   CO2 22 - 32 mmol/L '26  29  25   '$ Calcium 8.9 - 10.3 mg/dL 9.1  9.0  9.4   Total Protein 6.5 - 8.1 g/dL 6.1  6.1  6.8  Total Bilirubin 0.3 - 1.2 mg/dL 0.8  1.0  0.9   Alkaline Phos 38 - 126 U/L 68  55  71   AST 15 - 41 U/L '10  13  11   '$ ALT 0 - 44 U/L '6  11  8      '$ RADIOGRAPHIC STUDIES: I have personally reviewed the  radiological images as listed and agreed with the findings in the report. No results found.  ASSESSMENT & PLAN Daniel Gibson is a 72 y.o. male who presents for a follow up for newly diagnosed well differentiated neuroendocrine carcinoma, low grade.   #Well differentiated neuroendocrine carcinoma, low grade: --CT guided peritoneal mass biopsy on 01/18/2022 confirmed above diagnosis. --PET DOTATATE was completed on 01/30/2022, report pending --Recommend monthly lanreotide injection q 28 days. Reviewed dose, frequency and common side effects including injection site reaction, cholelithiasis, gallbladder sludge, hyperglycemia, hypoglycemia, thyroid dysfunction and bradycardia.  --Labs from today were reviewed and require no intervention. Baseline Chromogranin levels were 159.7 on 02/01/2022.  PLAN: --Continue with monthly lanreotide injection q 28 days. --CBC and CMP every month before treatment. Chromagranin levels every 3 months --labs today show white blood cell 7.8, hemoglobin 14.8, MCV 90.8, and platelets of 153.  Additionally creatinine was 1.23 and AST and ALT were within normal limits. --chromogranin A last 68 on 05/24/2022 --last CT scan on 05/24/2022 showed stable disease. Repeat scan in May 2024.  --RTC in 3 months for a toxicity evaluation  No orders of the defined types were placed in this encounter.   All questions were answered. The patient knows to call the clinic with any problems, questions or concerns.  I have spent a total of 25 minutes minutes of face-to-face and non-face-to-face time, preparing to see the patient,  performing a medically appropriate examination, counseling and educating the patient, ordering tests/procedures,documenting clinical information in the electronic health record, independently interpreting results and communicating results to the patient, and care coordination.  Ledell Peoples, MD Department of Hematology/Oncology [place] at  [facility] Phone: [phone] Pager: [phone] Email: [email or web address]

## 2022-07-18 ENCOUNTER — Telehealth: Payer: Self-pay | Admitting: Hematology and Oncology

## 2022-07-18 LAB — CHROMOGRANIN A: Chromogranin A (ng/mL): 70.6 ng/mL (ref 0.0–101.8)

## 2022-07-18 NOTE — Telephone Encounter (Signed)
Called patient per 1/22 los notes. Left voicemail with new appointment/contact information.

## 2022-07-18 NOTE — Telephone Encounter (Signed)
Called patient per 1/22 los notes to schedule f/u and injections. Patient's voicemail was full. Will try again in afternoon.

## 2022-07-19 ENCOUNTER — Other Ambulatory Visit: Payer: Medicare HMO

## 2022-07-19 ENCOUNTER — Ambulatory Visit: Payer: Medicare HMO

## 2022-08-16 ENCOUNTER — Inpatient Hospital Stay: Payer: Medicare HMO

## 2022-08-16 ENCOUNTER — Other Ambulatory Visit: Payer: Self-pay

## 2022-08-16 ENCOUNTER — Inpatient Hospital Stay: Payer: Medicare HMO | Attending: Physician Assistant

## 2022-08-16 VITALS — BP 151/80 | HR 65 | Temp 98.7°F | Resp 16

## 2022-08-16 DIAGNOSIS — C7A8 Other malignant neuroendocrine tumors: Secondary | ICD-10-CM | POA: Insufficient documentation

## 2022-08-16 LAB — CMP (CANCER CENTER ONLY)
ALT: 8 U/L (ref 0–44)
AST: 11 U/L — ABNORMAL LOW (ref 15–41)
Albumin: 4.1 g/dL (ref 3.5–5.0)
Alkaline Phosphatase: 83 U/L (ref 38–126)
Anion gap: 4 — ABNORMAL LOW (ref 5–15)
BUN: 11 mg/dL (ref 8–23)
CO2: 32 mmol/L (ref 22–32)
Calcium: 8.9 mg/dL (ref 8.9–10.3)
Chloride: 102 mmol/L (ref 98–111)
Creatinine: 1.22 mg/dL (ref 0.61–1.24)
GFR, Estimated: >60 mL/min (ref >60)
Glucose, Bld: 252 mg/dL — ABNORMAL HIGH (ref 70–99)
Potassium: 4.1 mmol/L (ref 3.5–5.1)
Sodium: 138 mmol/L (ref 135–145)
Total Bilirubin: 0.7 mg/dL (ref 0.3–1.2)
Total Protein: 6.2 g/dL — ABNORMAL LOW (ref 6.5–8.1)

## 2022-08-16 LAB — CBC WITH DIFFERENTIAL (CANCER CENTER ONLY)
Abs Immature Granulocytes: 0.02 10*3/uL (ref 0.00–0.07)
Basophils Absolute: 0.1 10*3/uL (ref 0.0–0.1)
Basophils Relative: 1 %
Eosinophils Absolute: 0.5 10*3/uL (ref 0.0–0.5)
Eosinophils Relative: 6 %
HCT: 39.6 % (ref 39.0–52.0)
Hemoglobin: 14.2 g/dL (ref 13.0–17.0)
Immature Granulocytes: 0 %
Lymphocytes Relative: 9 %
Lymphs Abs: 0.8 10*3/uL (ref 0.7–4.0)
MCH: 31.8 pg (ref 26.0–34.0)
MCHC: 35.9 g/dL (ref 30.0–36.0)
MCV: 88.8 fL (ref 80.0–100.0)
Monocytes Absolute: 0.9 10*3/uL (ref 0.1–1.0)
Monocytes Relative: 10 %
Neutro Abs: 6.5 10*3/uL (ref 1.7–7.7)
Neutrophils Relative %: 74 %
Platelet Count: 158 10*3/uL (ref 150–400)
RBC: 4.46 MIL/uL (ref 4.22–5.81)
RDW: 11.6 % (ref 11.5–15.5)
WBC Count: 8.8 10*3/uL (ref 4.0–10.5)
nRBC: 0 % (ref 0.0–0.2)

## 2022-08-16 MED ORDER — LANREOTIDE ACETATE 120 MG/0.5ML ~~LOC~~ SOLN
120.0000 mg | Freq: Once | SUBCUTANEOUS | Status: AC
  Administered 2022-08-16: 120 mg via SUBCUTANEOUS
  Filled 2022-08-16: qty 120

## 2022-09-13 ENCOUNTER — Inpatient Hospital Stay: Payer: Medicare HMO | Attending: Physician Assistant

## 2022-09-13 ENCOUNTER — Inpatient Hospital Stay: Payer: Medicare HMO

## 2022-09-13 ENCOUNTER — Other Ambulatory Visit: Payer: Self-pay

## 2022-09-13 VITALS — BP 122/66 | HR 62 | Temp 99.0°F | Resp 18

## 2022-09-13 DIAGNOSIS — C7B8 Other secondary neuroendocrine tumors: Secondary | ICD-10-CM | POA: Insufficient documentation

## 2022-09-13 DIAGNOSIS — C7A8 Other malignant neuroendocrine tumors: Secondary | ICD-10-CM | POA: Insufficient documentation

## 2022-09-13 LAB — CBC WITH DIFFERENTIAL (CANCER CENTER ONLY)
Abs Immature Granulocytes: 0.02 10*3/uL (ref 0.00–0.07)
Basophils Absolute: 0.1 10*3/uL (ref 0.0–0.1)
Basophils Relative: 1 %
Eosinophils Absolute: 0.5 10*3/uL (ref 0.0–0.5)
Eosinophils Relative: 8 %
HCT: 39.2 % (ref 39.0–52.0)
Hemoglobin: 13.8 g/dL (ref 13.0–17.0)
Immature Granulocytes: 0 %
Lymphocytes Relative: 9 %
Lymphs Abs: 0.6 10*3/uL — ABNORMAL LOW (ref 0.7–4.0)
MCH: 31.7 pg (ref 26.0–34.0)
MCHC: 35.2 g/dL (ref 30.0–36.0)
MCV: 90.1 fL (ref 80.0–100.0)
Monocytes Absolute: 0.6 10*3/uL (ref 0.1–1.0)
Monocytes Relative: 9 %
Neutro Abs: 5 10*3/uL (ref 1.7–7.7)
Neutrophils Relative %: 73 %
Platelet Count: 138 10*3/uL — ABNORMAL LOW (ref 150–400)
RBC: 4.35 MIL/uL (ref 4.22–5.81)
RDW: 11.9 % (ref 11.5–15.5)
WBC Count: 6.8 10*3/uL (ref 4.0–10.5)
nRBC: 0 % (ref 0.0–0.2)

## 2022-09-13 LAB — CMP (CANCER CENTER ONLY)
ALT: 7 U/L (ref 0–44)
AST: 10 U/L — ABNORMAL LOW (ref 15–41)
Albumin: 4 g/dL (ref 3.5–5.0)
Alkaline Phosphatase: 75 U/L (ref 38–126)
Anion gap: 4 — ABNORMAL LOW (ref 5–15)
BUN: 14 mg/dL (ref 8–23)
CO2: 30 mmol/L (ref 22–32)
Calcium: 9.2 mg/dL (ref 8.9–10.3)
Chloride: 101 mmol/L (ref 98–111)
Creatinine: 1.15 mg/dL (ref 0.61–1.24)
GFR, Estimated: >60 mL/min (ref >60)
Glucose, Bld: 270 mg/dL — ABNORMAL HIGH (ref 70–99)
Potassium: 4.7 mmol/L (ref 3.5–5.1)
Sodium: 135 mmol/L (ref 135–145)
Total Bilirubin: 0.9 mg/dL (ref 0.3–1.2)
Total Protein: 5.8 g/dL — ABNORMAL LOW (ref 6.5–8.1)

## 2022-09-13 MED ORDER — LANREOTIDE ACETATE 120 MG/0.5ML ~~LOC~~ SOLN
120.0000 mg | Freq: Once | SUBCUTANEOUS | Status: AC
  Administered 2022-09-13: 120 mg via SUBCUTANEOUS
  Filled 2022-09-13: qty 120

## 2022-09-15 DIAGNOSIS — E782 Mixed hyperlipidemia: Secondary | ICD-10-CM | POA: Diagnosis not present

## 2022-09-15 DIAGNOSIS — Z91018 Allergy to other foods: Secondary | ICD-10-CM | POA: Diagnosis not present

## 2022-09-15 DIAGNOSIS — E1129 Type 2 diabetes mellitus with other diabetic kidney complication: Secondary | ICD-10-CM | POA: Diagnosis not present

## 2022-09-15 DIAGNOSIS — D696 Thrombocytopenia, unspecified: Secondary | ICD-10-CM | POA: Diagnosis not present

## 2022-09-19 DIAGNOSIS — E1129 Type 2 diabetes mellitus with other diabetic kidney complication: Secondary | ICD-10-CM | POA: Diagnosis not present

## 2022-09-19 DIAGNOSIS — G4700 Insomnia, unspecified: Secondary | ICD-10-CM | POA: Diagnosis not present

## 2022-09-19 DIAGNOSIS — Z91018 Allergy to other foods: Secondary | ICD-10-CM | POA: Diagnosis not present

## 2022-09-19 DIAGNOSIS — E782 Mixed hyperlipidemia: Secondary | ICD-10-CM | POA: Diagnosis not present

## 2022-09-19 DIAGNOSIS — I10 Essential (primary) hypertension: Secondary | ICD-10-CM | POA: Diagnosis not present

## 2022-09-19 DIAGNOSIS — R809 Proteinuria, unspecified: Secondary | ICD-10-CM | POA: Diagnosis not present

## 2022-09-19 DIAGNOSIS — J309 Allergic rhinitis, unspecified: Secondary | ICD-10-CM | POA: Diagnosis not present

## 2022-10-05 IMAGING — CR DG CHEST 2V
2 series · 2 of 2 positions shown · non-contrast
Comparison: May 31, 2012

CLINICAL DATA: Preoperative evaluation.

EXAM:
CHEST - 2 VIEW

[w chest pa]
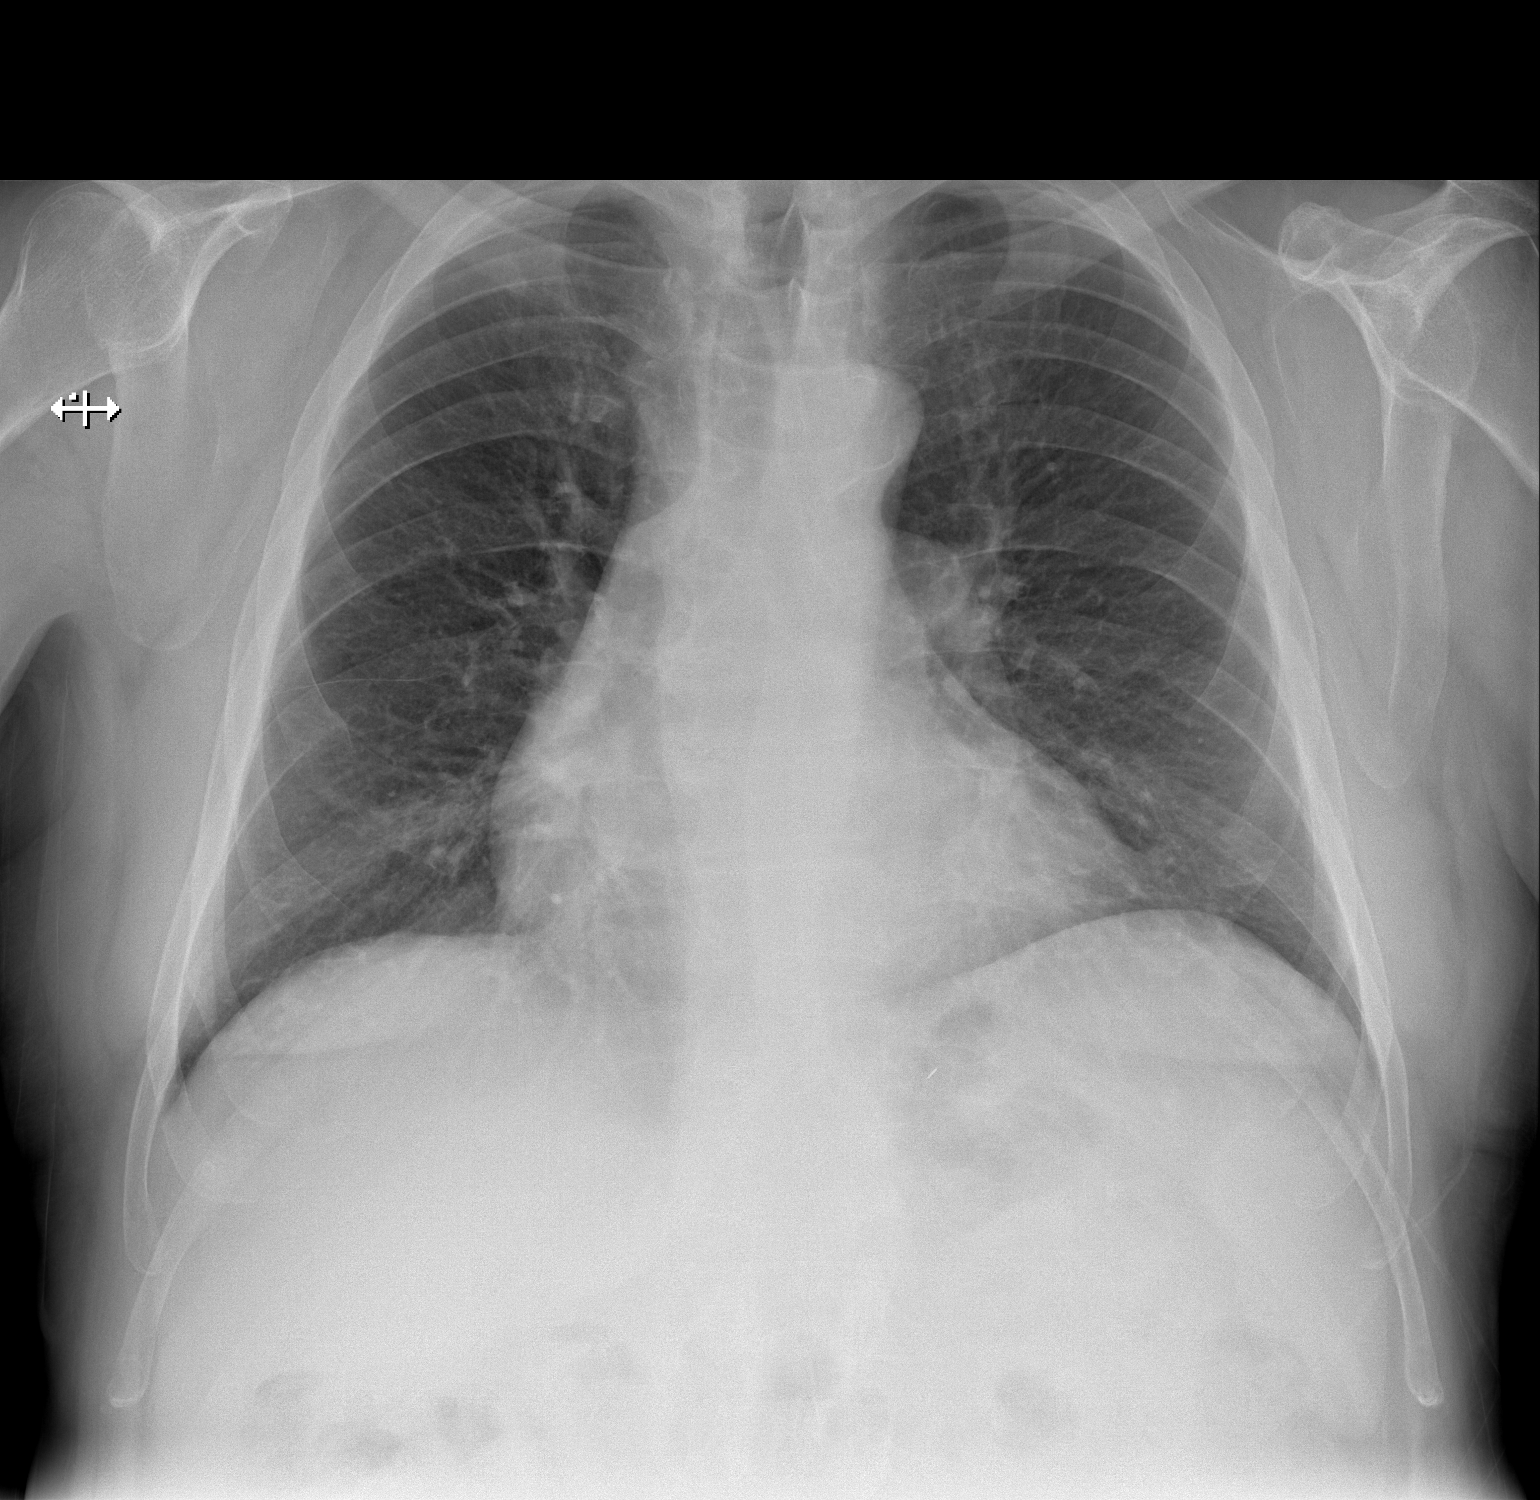

[w chest lat]
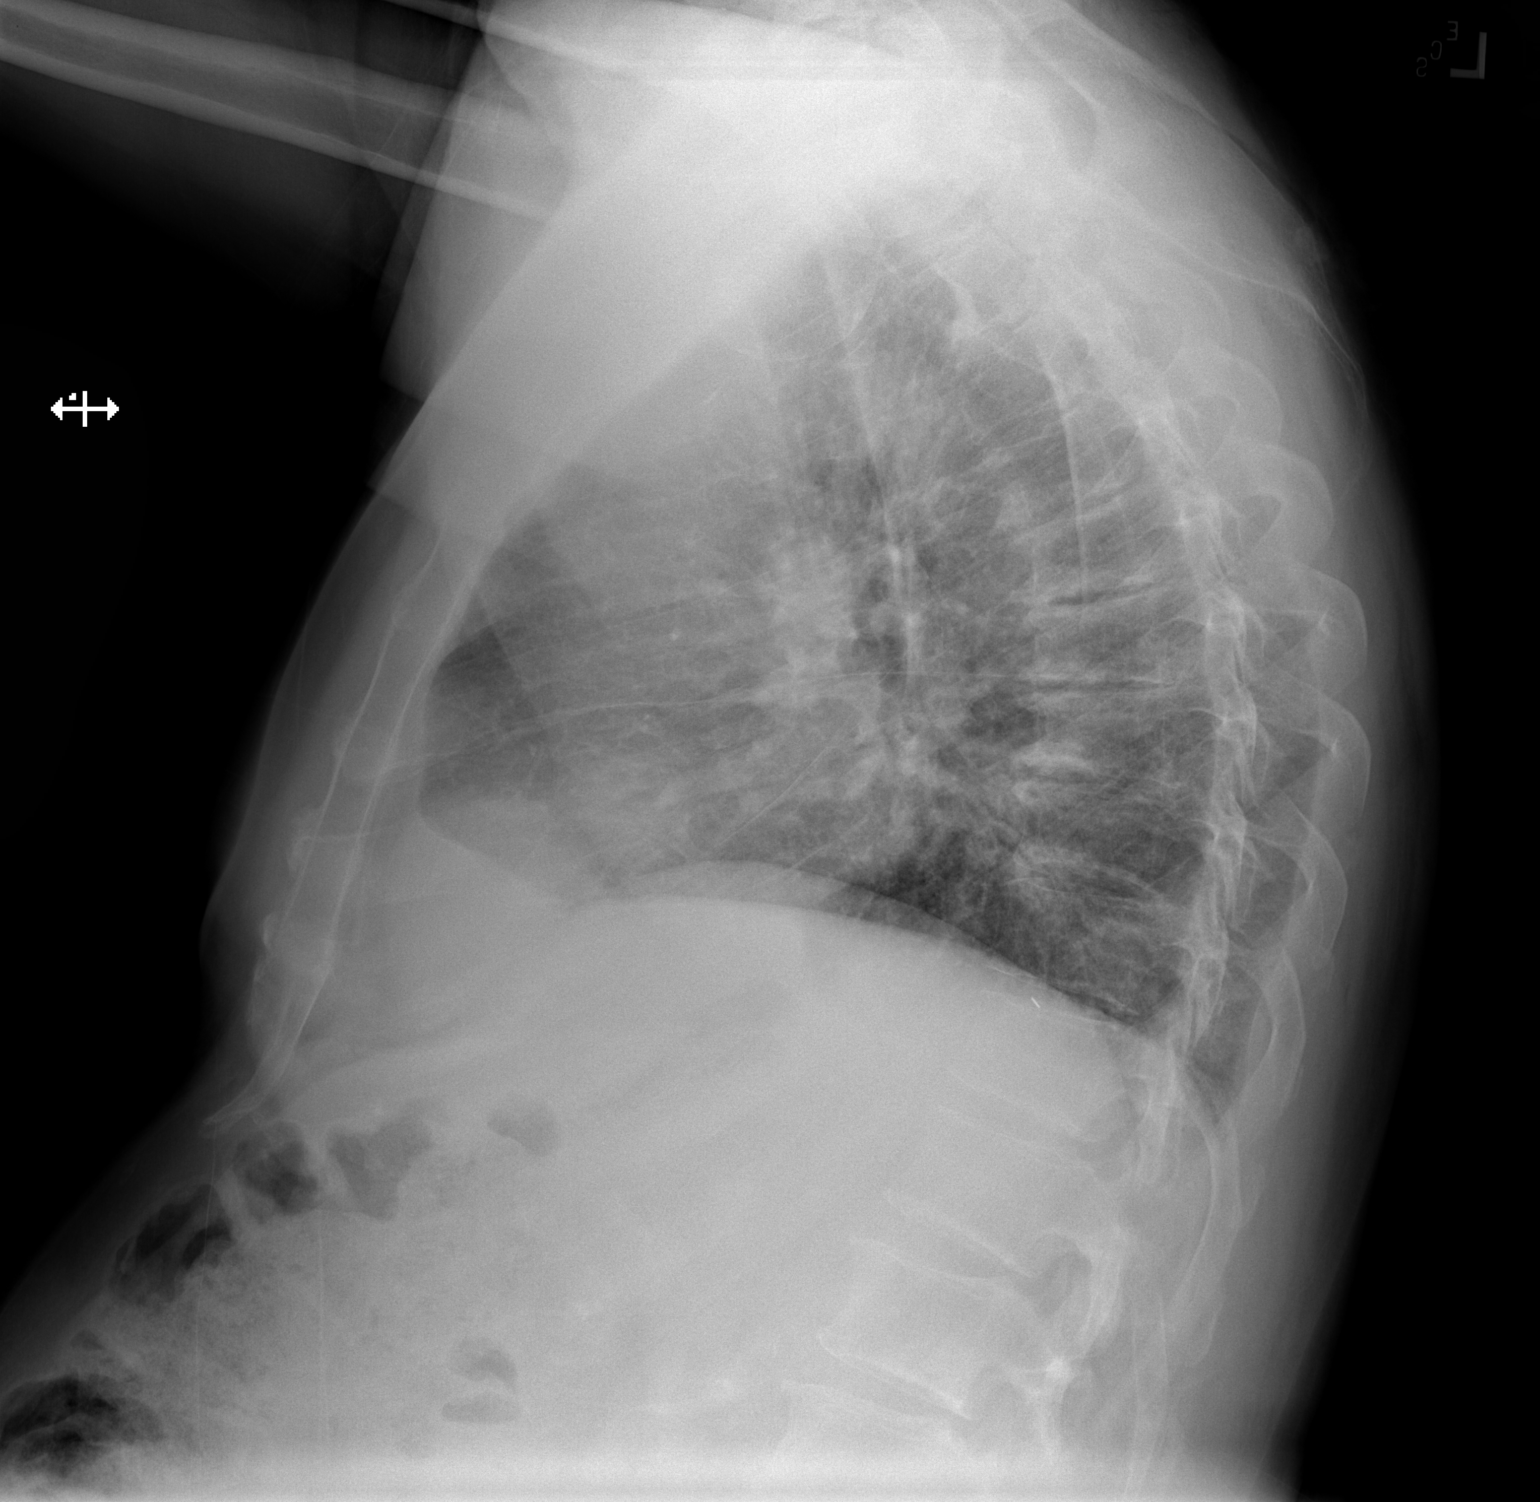

[2 of 2 positions shown; findings below may reference images not displayed]

FINDINGS: The heart size and mediastinal contours are within normal limits.
Mild to moderate severity calcification of the aortic arch is noted.
Both lungs are clear. A tiny linear radiopaque foreign body is again
seen projecting over the medial aspect of the left upper quadrant.
Degenerative changes are seen within the thoracic spine.
IMPRESSION: No active cardiopulmonary disease.

## 2022-10-10 NOTE — Progress Notes (Unsigned)
[facility] [phone]   Fax:(336) [phone]  PROGRESS NOTE:  Patient Care Team: Ailene Ravel, MD as PCP - General (Family Medicine) Othella Boyer, MD as Attending Physician (Cardiology) Chipper Herb, MD (Inactive) as Attending Physician (Radiation Oncology) Barron Alvine, MD (Inactive) as Attending Physician (Urology)  CHIEF COMPLAINTS/PURPOSE OF CONSULTATION:  Right renal mass and peritoneal nodularity  ONCOLOGIC HISTORY: Presented to the [facility] ER on 12/11/2021 for painless hematuria and urinary urgency.  Work-up included urinalysis showed large leukocyte esterase, blood, protein, positive nitrates.  Urine culture grew E. coli.  Patient was treated with cefdinir.  He underwent CT imaging that showed a nonobstructing 6 mm stone in the interpolar left kidney, indeterminate 2.9 x 3.4 x 4.3 cm lesion in the upper pole right kidney, multiple peritoneal and omental nodules concerning for peritoneal carcinomatosis. 01/10/2022: CT chest: Incidental right lower lobe segmental and subsegmental pulmonary emboli.Minimal clot burden without right heart strain.No evidence of intrathoracic metastases.Small focus of ground-glass airspace disease within the right upper lobe likely inflammatory or infectious. Started on Eliquis therapy 01/18/2022: CT guided biopsy of peritoneal mass: Well-differentiated low-grade neuroendocrine neoplasm with features  suspicious carcinoid tumor. Ki-67 shows very low proliferative index score.  01/30/2022: PET DOTATATE showed extensive tracer avid peritoneal metastasis and tracer avid liver metastasis to the posterior right hepatic lobe. 02/01/2022: Start lanreotide monthly injections  INTERIM HISTORY:  Daniel Gibson returns today for a follow up visit after recent diagnosis of low grade neuroendocrine carcinoma. He is accompanied by his wife for this visit.   On exam today Daniel Gibson reports ***  He denies fevers,  chills,sweats, shortness of breath, chest pain, cough, flushing or palpitations. He has no other complaints. Rest of the 10 point ROS is below.   MEDICAL HISTORY:  Past Medical History:  Diagnosis Date   Carotid artery disease ([facility]) 05/08/2012   CEA on left 2013   Coronary artery disease    Diabetes mellitus without complication ([facility])    Food allergy    Alpha Gal allergy    Hyperlipidemia    Hypertensive heart disease without CHF 04/08/2018   Myocardial infarction ([facility]) 1993   medically managed   Obesity (BMI 30-39.9) 04/08/2018   Prostate cancer ([facility]) 08/07/2016   PVC's (premature ventricular contractions) 04/08/2018   Thrombocytopenia ([facility])     SURGICAL HISTORY: Past Surgical History:  Procedure Laterality Date   APPENDECTOMY     CAROTID ENDARTERECTOMY Right 06-07-12   cea   CORONARY ARTERY BYPASS GRAFT N/A 10/18/2020   Procedure: CORONARY ARTERY BYPASS GRAFTING (CABG)x3. LEFT INTERNAL MAMMARY ARTERY. RIGHT ENDOSCOPIC SAPHENOUS VEIN HARVESTING.;  Surgeon: Linden Dolin, MD;  Location: [facility] OR;  Service: Open Heart Surgery;  Laterality: N/A;   ENDARTERECTOMY  06/07/2012   Procedure: ENDARTERECTOMY CAROTID;  Surgeon: Chuck Hint, MD;  Location: [facility] OR;  Service: Vascular;  Laterality: Right;  Right Carotid Endarterectomy with Patch Angioplasty   LEFT HEART CATH AND CORONARY ANGIOGRAPHY N/A 10/07/2020   Procedure: LEFT HEART CATH AND CORONARY ANGIOGRAPHY;  Surgeon: Lennette Bihari, MD;  Location: [facility] INVASIVE CV LAB;  Service: Cardiovascular;  Laterality: N/A;   TEE WITHOUT CARDIOVERSION N/A 10/18/2020   Procedure: TRANSESOPHAGEAL ECHOCARDIOGRAM (TEE);  Surgeon: Linden Dolin, MD;  Location: [facility] OR;  Service: Open Heart Surgery;  Laterality: N/A;   TRANSPERINEAL IMPLANT OF RADIATION SEEDS W/ ULTRASOUND      SOCIAL HISTORY: Social History   Socioeconomic History   Marital status: Married    Spouse  name: Not on file   Number of children: Not on file   Years of  education: Not on file   Highest education level: Not on file  Occupational History   Not on file  Tobacco Use   Smoking status: Never   Smokeless tobacco: Never  Vaping Use   Vaping Use: Never used  Substance and Sexual Activity   Alcohol use: No   Drug use: No   Sexual activity: Not on file  Other Topics Concern   Not on file  Social History Narrative   Not on file   Social Determinants of Health   Financial Resource Strain: Not on file  Food Insecurity: Not on file  Transportation Needs: Not on file  Physical Activity: Not on file  Stress: Not on file  Social Connections: Not on file  Intimate Partner Violence: Not on file    FAMILY HISTORY: Family History  Problem Relation Age of Onset   Cancer Mother    Hyperlipidemia Mother    Hypertension Mother    Heart attack Father    Heart disease Father        Heart Disease before age 1   Hyperlipidemia Father    Hypertension Father    Diabetes Sister    Heart disease Sister        before age 8   Hypertension Sister    Hyperlipidemia Sister    Diabetes Brother    Heart disease Brother        before age 43   Hyperlipidemia Brother    Hypertension Brother     ALLERGIES:  is allergic to ace inhibitors, albumin (human), beef-derived products, gelfoam-jmi sponge, heparin, pork-derived products, sheep-derived products, and tetanus toxoids.  MEDICATIONS:  Current Outpatient Medications  Medication Sig Dispense Refill   amLODipine (NORVASC) 10 MG tablet Take 10 mg by mouth in the morning.     apixaban (ELIQUIS) 5 MG TABS tablet Take 1 tablet (5 mg total) by mouth 2 (two) times daily. 60 tablet 5   carvedilol (COREG) 25 MG tablet Take 12.5 mg by mouth 2 (two) times daily with a meal.     EPINEPHrine (EPIPEN 2-PAK) 0.3 mg/0.3 mL IJ SOAJ injection Use as directed for life-threatening allergic reaction. 2 each 3   ezetimibe (ZETIA) 10 MG tablet TAKE 1 TABLET BY MOUTH EVERY DAY 90 tablet 3   metFORMIN (GLUCOPHAGE) 1000  MG tablet Take 500 mg by mouth in the morning and at bedtime.     oxybutynin (DITROPAN) 5 MG tablet Take 5 mg by mouth. (Patient not taking: Reported on 02/01/2022)     pantoprazole (PROTONIX) 40 MG tablet TAKE 1 TABLET BY MOUTH EVERY DAY 90 tablet 3   Polyethylene Glycol 3350 (MIRALAX PO) Take 1 Package by mouth as needed (constipation).     rosuvastatin (CRESTOR) 40 MG tablet Take 1 tablet (40 mg total) by mouth daily. 90 tablet 2   Tamsulosin HCl (FLOMAX) 0.4 MG CAPS Take 0.8 mg by mouth at bedtime.     traZODone (DESYREL) 50 MG tablet Take 50 mg by mouth at bedtime.     TROSPIUM CHLORIDE PO Take 60 mg by mouth daily.     No current facility-administered medications for this visit.    REVIEW OF SYSTEMS:   Constitutional: ( - ) fevers, ( - )  chills , ( - ) night sweats Eyes: ( - ) blurriness of vision, ( - ) double vision, ( - ) watery eyes Ears, nose, mouth, throat, and face: ( - )  mucositis, ( - ) sore throat Respiratory: ( - ) cough, ( - ) dyspnea, ( - ) wheezes Cardiovascular: ( - ) palpitation, ( - ) chest discomfort, ( - ) lower extremity swelling Gastrointestinal:  ( - ) nausea, ( - ) heartburn, ( - ) change in bowel habits Skin: ( - ) abnormal skin rashes Lymphatics: ( - ) new lymphadenopathy, ( - ) easy bruising Neurological: ( - ) numbness, ( - ) tingling, ( - ) new weaknesses Behavioral/Psych: ( - ) mood change, ( - ) new changes  All other systems were reviewed with the patient and are negative.  PHYSICAL EXAMINATION: ECOG PERFORMANCE STATUS: 0 - Asymptomatic  There were no vitals filed for this visit.  There were no vitals filed for this visit.   GENERAL: well appearing male in NAD  SKIN: skin color, texture, turgor are normal, no rashes or significant lesions EYES: conjunctiva are pink and non-injected, sclera clear LUNGS: clear to auscultation and percussion with normal breathing effort HEART: regular rate & rhythm and no murmurs and no lower extremity  edema Musculoskeletal: no cyanosis of digits and no clubbing  PSYCH: alert & oriented x 3, fluent speech NEURO: no focal motor/sensory deficits  LABORATORY DATA:  I have reviewed the data as listed    Latest Ref Rng & Units 09/13/2022    1:14 PM 08/16/2022    1:09 PM 07/17/2022   10:23 AM  CBC  WBC 4.0 - 10.5 K/uL 6.8  8.8  7.8   Hemoglobin 13.0 - 17.0 g/dL 96.0  45.4  09.8   Hematocrit 39.0 - 52.0 % 39.2  39.6  42.3   Platelets 150 - 400 K/uL 138  158  153        Latest Ref Rng & Units 09/13/2022    1:14 PM 08/16/2022    1:09 PM 07/17/2022   10:23 AM  CMP  Glucose 70 - 99 mg/dL 119  147  829   BUN 8 - 23 mg/dL Creatinine 0.61 - 1.24 mg/dL 5.62  1.30  8.65   Sodium 135 - 145 mmol/L 135  138  135   Potassium 3.5 - 5.1 mmol/L 4.7  4.1  4.2   Chloride 98 - 111 mmol/L 101  102  103   CO2 22 - 32 mmol/L 30  32  26   Calcium 8.9 - 10.3 mg/dL 9.2  8.9  9.1   Total Protein 6.5 - 8.1 g/dL 5.8  6.2  6.1   Total Bilirubin 0.3 - 1.2 mg/dL 0.9  0.7  0.8   Alkaline Phos 38 - 126 U/L 75  83  68   AST 15 - 41 U/L ALT 0 - 44 U/L RADIOGRAPHIC STUDIES: I have personally reviewed the radiological images as listed and agreed with the findings in the report. No results found.  ASSESSMENT & PLAN Daniel Gibson is a 72 y.o. male who presents for a follow up for newly diagnosed well differentiated neuroendocrine carcinoma, low grade.   #Well differentiated neuroendocrine carcinoma, low grade: --CT guided peritoneal mass biopsy on 01/18/2022 confirmed above diagnosis. --PET DOTATATE was completed on 01/30/2022, report pending --Recommend monthly lanreotide injection q 28 days. Reviewed dose, frequency and common side effects including injection site reaction, cholelithiasis, gallbladder sludge, hyperglycemia, hypoglycemia, thyroid dysfunction and bradycardia.  --Labs from today were reviewed and require no intervention.  Baseline Chromogranin levels were 159.7  on 02/01/2022.  PLAN: --Continue with monthly lanreotide injection q 28 days. --CBC and CMP every month before treatment. Chromagranin levels every 3 months --labs today show white blood cell *** Additionally creatinine was *** and AST and ALT were within normal limits. --chromogranin A last *** on 05/24/2022 --last CT scan on 05/24/2022 showed stable disease. Repeat scan in May 2024.  --RTC in 3 months for a toxicity evaluation  No orders of the defined types were placed in this encounter.   All questions were answered. The patient knows to call the clinic with any problems, questions or concerns.  I have spent a total of 30 minutes minutes of face-to-face and non-face-to-face time, preparing to see the patient,  performing a medically appropriate examination, counseling and educating the patient, ordering tests/procedures,documenting clinical information in the electronic health record, independently interpreting results and communicating results to the patient, and care coordination.  Ulysees Barns, MD Department of Hematology/Oncology [facility] at [facility] Phone: [phone] Pager: [phone] Email: [email or web address]

## 2022-10-11 ENCOUNTER — Inpatient Hospital Stay: Payer: Medicare HMO

## 2022-10-11 ENCOUNTER — Inpatient Hospital Stay: Payer: Medicare HMO | Attending: Physician Assistant | Admitting: Hematology and Oncology

## 2022-10-11 ENCOUNTER — Other Ambulatory Visit: Payer: Self-pay

## 2022-10-11 VITALS — BP 173/82 | HR 65 | Temp 98.2°F | Resp 13 | Wt 173.9 lb

## 2022-10-11 DIAGNOSIS — C7A8 Other malignant neuroendocrine tumors: Secondary | ICD-10-CM

## 2022-10-11 DIAGNOSIS — C7B8 Other secondary neuroendocrine tumors: Secondary | ICD-10-CM | POA: Insufficient documentation

## 2022-10-11 LAB — CBC WITH DIFFERENTIAL (CANCER CENTER ONLY)
Abs Immature Granulocytes: 0.02 10*3/uL (ref 0.00–0.07)
Basophils Absolute: 0.1 10*3/uL (ref 0.0–0.1)
Basophils Relative: 1 %
Eosinophils Absolute: 0.4 10*3/uL (ref 0.0–0.5)
Eosinophils Relative: 5 %
HCT: 37.6 % — ABNORMAL LOW (ref 39.0–52.0)
Hemoglobin: 13.5 g/dL (ref 13.0–17.0)
Immature Granulocytes: 0 %
Lymphocytes Relative: 8 %
Lymphs Abs: 0.7 10*3/uL (ref 0.7–4.0)
MCH: 31.4 pg (ref 26.0–34.0)
MCHC: 35.9 g/dL (ref 30.0–36.0)
MCV: 87.4 fL (ref 80.0–100.0)
Monocytes Absolute: 0.7 10*3/uL (ref 0.1–1.0)
Monocytes Relative: 8 %
Neutro Abs: 6.2 10*3/uL (ref 1.7–7.7)
Neutrophils Relative %: 78 %
Platelet Count: 144 10*3/uL — ABNORMAL LOW (ref 150–400)
RBC: 4.3 MIL/uL (ref 4.22–5.81)
RDW: 11.9 % (ref 11.5–15.5)
WBC Count: 8 10*3/uL (ref 4.0–10.5)
nRBC: 0 % (ref 0.0–0.2)

## 2022-10-11 LAB — CMP (CANCER CENTER ONLY)
ALT: 8 U/L (ref 0–44)
AST: 10 U/L — ABNORMAL LOW (ref 15–41)
Albumin: 4 g/dL (ref 3.5–5.0)
Alkaline Phosphatase: 79 U/L (ref 38–126)
Anion gap: 6 (ref 5–15)
BUN: 10 mg/dL (ref 8–23)
CO2: 26 mmol/L (ref 22–32)
Calcium: 9 mg/dL (ref 8.9–10.3)
Chloride: 102 mmol/L (ref 98–111)
Creatinine: 1.21 mg/dL (ref 0.61–1.24)
GFR, Estimated: >60 mL/min (ref >60)
Glucose, Bld: 350 mg/dL — ABNORMAL HIGH (ref 70–99)
Potassium: 4 mmol/L (ref 3.5–5.1)
Sodium: 134 mmol/L — ABNORMAL LOW (ref 135–145)
Total Bilirubin: 0.7 mg/dL (ref 0.3–1.2)
Total Protein: 6.1 g/dL — ABNORMAL LOW (ref 6.5–8.1)

## 2022-10-11 IMAGING — DX DG CHEST 2V
2 series · 2 of 2 positions shown · non-contrast
Comparison: 10/20/2020.

CLINICAL DATA: Cardiac surgery.

EXAM:
CHEST - 2 VIEW

[chest pa]
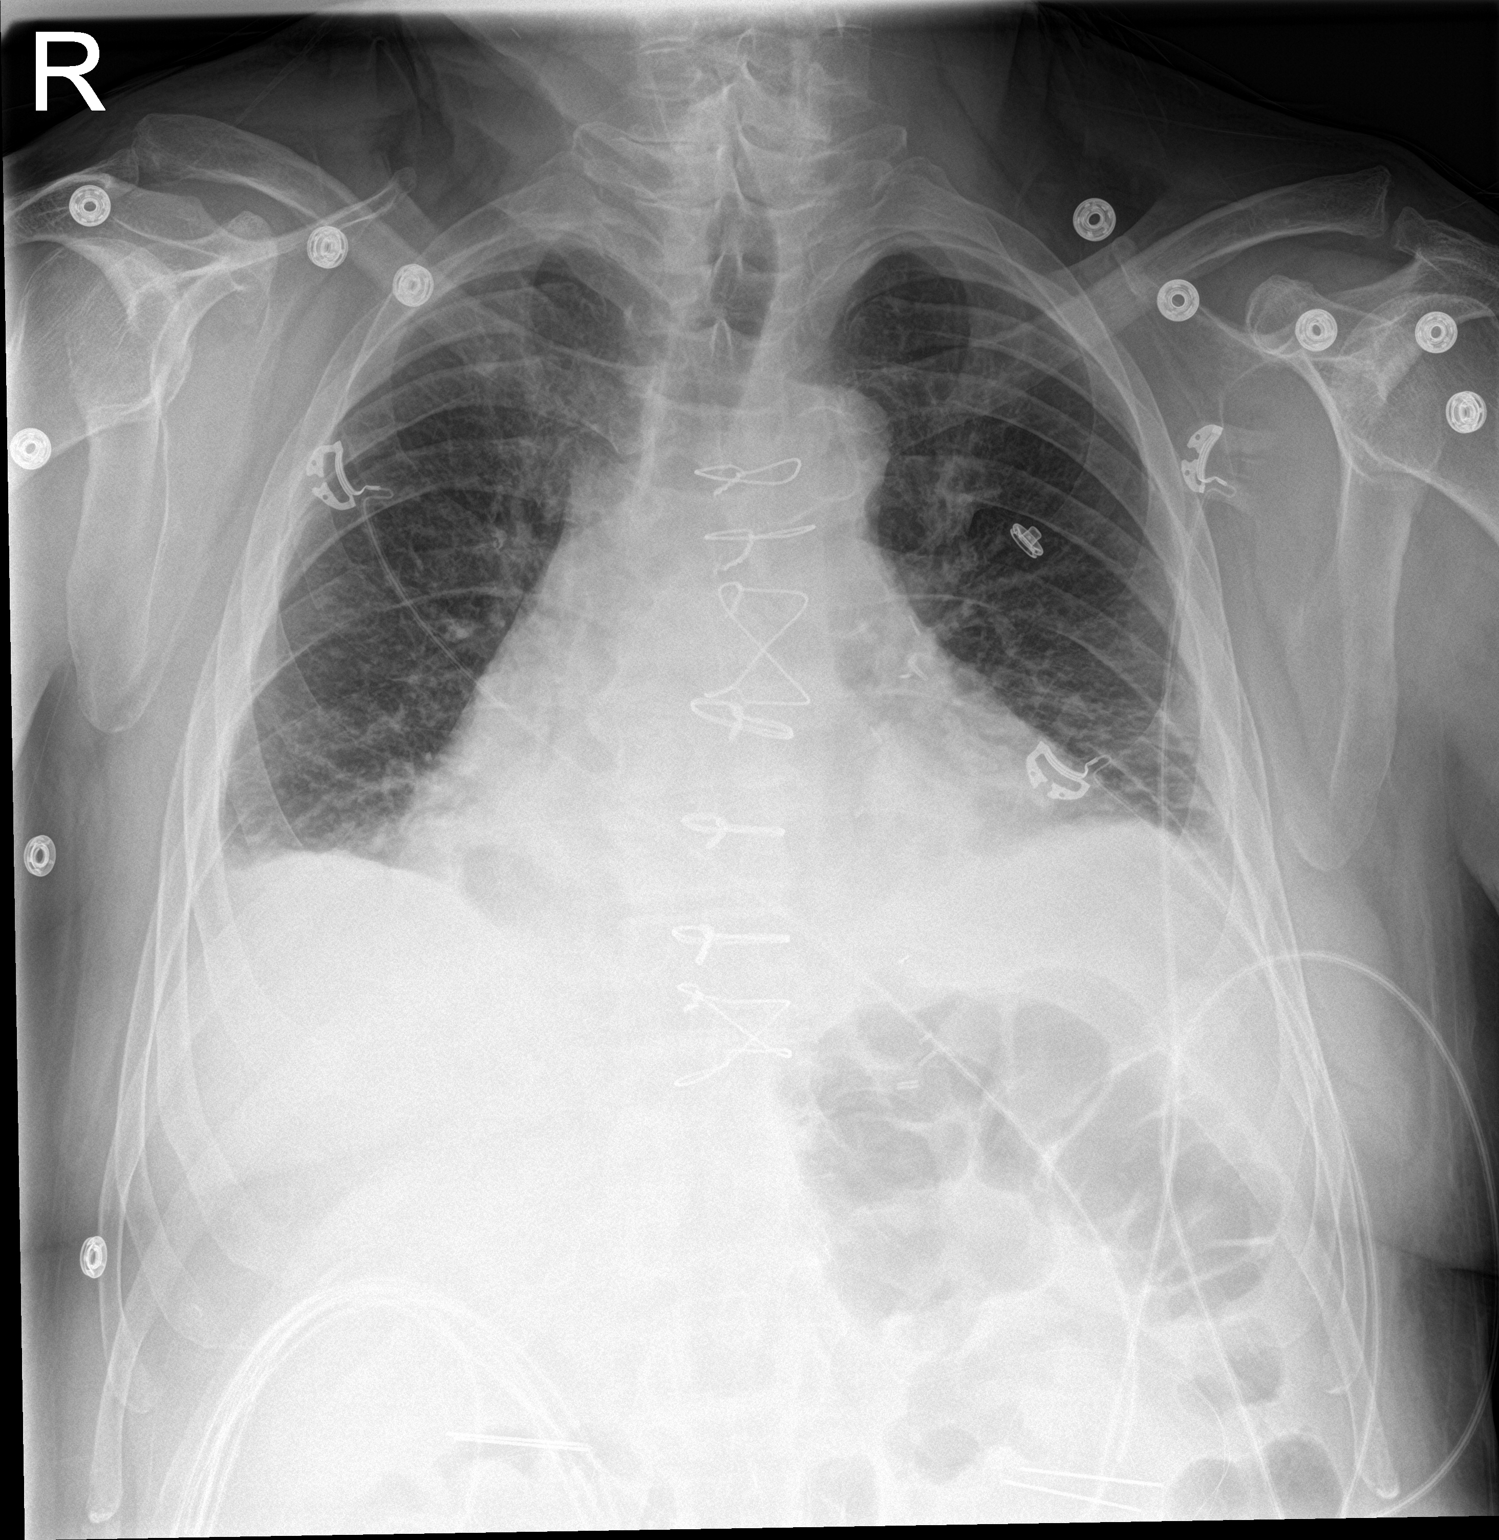

[chest lat]
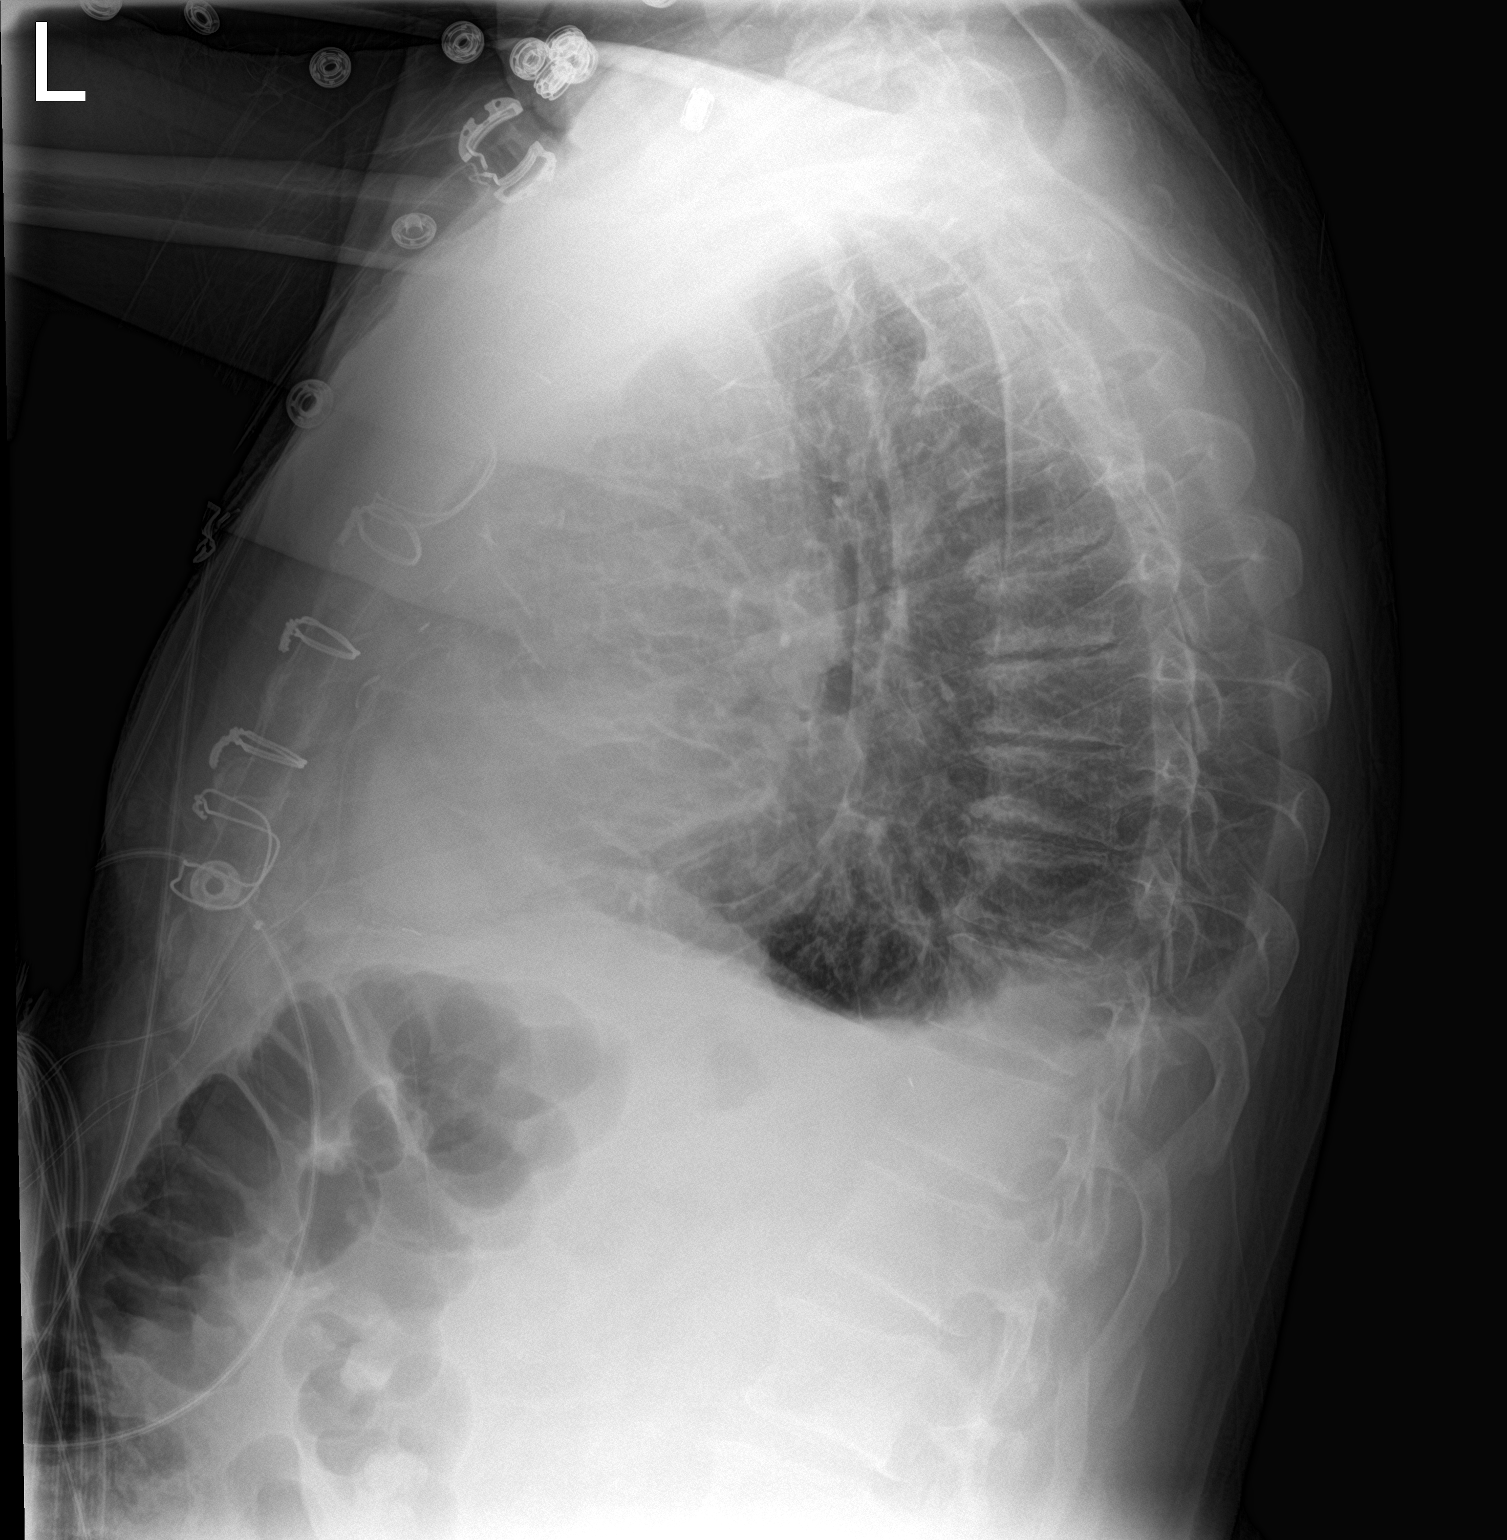

[2 of 2 positions shown; findings below may reference images not displayed]

FINDINGS: Interim removal of right IJ sheath, mediastinal drainage catheters,
left chest tube. Prior CABG. Cardiomegaly. No pulmonary venous
congestion. Improved aeration of both lungs with mild residual
bibasilar atelectasis/infiltrates. Small right pleural effusion
again noted. No pneumothorax.
IMPRESSION: 1. Interim removal of right IJ sheath, mediastinal drainage
catheter, left chest tube. No pneumothorax.

2.  Prior CABG.  Stable cardiomegaly.

3. Improved aeration of both lungs with mild residual bibasilar
atelectasis/infiltrates. Small right pleural effusion again noted.

## 2022-10-11 MED ORDER — APIXABAN 5 MG PO TABS: 5.0000 mg | ORAL_TABLET | Freq: Two times a day (BID) | ORAL | 5 refills | Status: DC

## 2022-10-11 MED ORDER — LANREOTIDE ACETATE 120 MG/0.5ML ~~LOC~~ SOLN
120.0000 mg | Freq: Once | SUBCUTANEOUS | Status: AC
  Administered 2022-10-11: 120 mg via SUBCUTANEOUS
  Filled 2022-10-11: qty 120

## 2022-10-12 ENCOUNTER — Telehealth: Payer: Self-pay | Admitting: Hematology and Oncology

## 2022-10-12 NOTE — Telephone Encounter (Signed)
Reached out to patient to schedule per LOS no answer will mail packet.

## 2022-10-16 ENCOUNTER — Telehealth: Payer: Self-pay | Admitting: *Deleted

## 2022-10-16 NOTE — Telephone Encounter (Signed)
Received call from pt inquiring about his labs and injection appts for May. It appears they were cancelled but per Dr. Derek Mound note, pt is to continue monthly labs and injection. Advised that I will send a scheduling message to get him back on the schedule for May. He is also asking about his repeat scans that are due in May. Provided pt phone # for [facility] so he can schedule but advised that the scan is still pending insurance approval. Pt voiced understanding to the above. Scheduling message sent for labs and injection.

## 2022-11-07 ENCOUNTER — Inpatient Hospital Stay: Payer: Medicare HMO | Attending: Physician Assistant

## 2022-11-07 ENCOUNTER — Inpatient Hospital Stay: Payer: Medicare HMO

## 2022-11-07 ENCOUNTER — Other Ambulatory Visit: Payer: Self-pay

## 2022-11-07 VITALS — BP 126/65 | HR 60 | Temp 98.2°F | Resp 16

## 2022-11-07 DIAGNOSIS — C7B8 Other secondary neuroendocrine tumors: Secondary | ICD-10-CM | POA: Insufficient documentation

## 2022-11-07 DIAGNOSIS — C7A8 Other malignant neuroendocrine tumors: Secondary | ICD-10-CM | POA: Insufficient documentation

## 2022-11-07 LAB — CBC WITH DIFFERENTIAL (CANCER CENTER ONLY)
Abs Immature Granulocytes: 0.03 10*3/uL (ref 0.00–0.07)
Basophils Absolute: 0.1 10*3/uL (ref 0.0–0.1)
Basophils Relative: 1 %
Eosinophils Absolute: 0.5 10*3/uL (ref 0.0–0.5)
Eosinophils Relative: 6 %
HCT: 39.3 % (ref 39.0–52.0)
Hemoglobin: 14.1 g/dL (ref 13.0–17.0)
Immature Granulocytes: 0 %
Lymphocytes Relative: 7 %
Lymphs Abs: 0.6 10*3/uL — ABNORMAL LOW (ref 0.7–4.0)
MCH: 31.8 pg (ref 26.0–34.0)
MCHC: 35.9 g/dL (ref 30.0–36.0)
MCV: 88.7 fL (ref 80.0–100.0)
Monocytes Absolute: 0.7 10*3/uL (ref 0.1–1.0)
Monocytes Relative: 8 %
Neutro Abs: 6.5 10*3/uL (ref 1.7–7.7)
Neutrophils Relative %: 78 %
Platelet Count: 165 10*3/uL (ref 150–400)
RBC: 4.43 MIL/uL (ref 4.22–5.81)
RDW: 12.1 % (ref 11.5–15.5)
WBC Count: 8.4 10*3/uL (ref 4.0–10.5)
nRBC: 0 % (ref 0.0–0.2)

## 2022-11-07 LAB — CMP (CANCER CENTER ONLY)
ALT: 8 U/L (ref 0–44)
AST: 10 U/L — ABNORMAL LOW (ref 15–41)
Albumin: 4.2 g/dL (ref 3.5–5.0)
Alkaline Phosphatase: 70 U/L (ref 38–126)
Anion gap: 5 (ref 5–15)
BUN: 16 mg/dL (ref 8–23)
CO2: 28 mmol/L (ref 22–32)
Calcium: 8.8 mg/dL — ABNORMAL LOW (ref 8.9–10.3)
Chloride: 104 mmol/L (ref 98–111)
Creatinine: 1.1 mg/dL (ref 0.61–1.24)
GFR, Estimated: >60 mL/min (ref >60)
Glucose, Bld: 252 mg/dL — ABNORMAL HIGH (ref 70–99)
Potassium: 4.5 mmol/L (ref 3.5–5.1)
Sodium: 137 mmol/L (ref 135–145)
Total Bilirubin: 0.7 mg/dL (ref 0.3–1.2)
Total Protein: 6.2 g/dL — ABNORMAL LOW (ref 6.5–8.1)

## 2022-11-07 MED ORDER — LANREOTIDE ACETATE 120 MG/0.5ML ~~LOC~~ SOLN
120.0000 mg | Freq: Once | SUBCUTANEOUS | Status: AC
  Administered 2022-11-07: 120 mg via SUBCUTANEOUS
  Filled 2022-11-07: qty 120

## 2022-11-07 NOTE — Patient Instructions (Signed)
Lanreotide Injection What is this medication? LANREOTIDE (lan REE oh tide) treats high levels of growth hormone (acromegaly). It is used when other therapies have not worked well enough or cannot be tolerated. It works by reducing the amount of growth hormone your body makes. This reduces symptoms and the risk of health problems caused by too much growth hormone, such as diabetes and heart disease. It may also be used to treat neuroendocrine tumors, a cancer of the cells that release hormones and other substances in your body. It works by slowing down the release of these substances from the cells. This slows tumor growth. It also decreases the symptoms of carcinoid syndrome, such as flushing or diarrhea. This medicine may be used for other purposes; ask your health care provider or pharmacist if you have questions. COMMON BRAND NAME(S): Somatuline [place] What should I tell my care team before I take this medication? They need to know if you have any of these conditions: Diabetes Gallbladder disease Heart disease Kidney disease Liver disease Thyroid disease An unusual or allergic reaction to lanreotide, other medications, foods, dyes, or preservatives Pregnant or trying to get pregnant Breast-feeding How should I use this medication? This medication is injected under the skin. It is given by your care team in a hospital or clinic setting. Talk to your care team about the use of this medication in children. Special care may be needed. Overdosage: If you think you have taken too much of this medicine contact a poison control center or emergency room at once. NOTE: This medicine is only for you. Do not share this medicine with others. What if I miss a dose? Keep appointments for follow-up doses. It is important not to miss your dose. Call your care team if you are unable to keep an appointment. What may interact with this medication? Bromocriptine Cyclosporine Certain medications for blood  pressure, heart disease, irregular heartbeat Certain medications for diabetes Quinidine Terfenadine This list may not describe all possible interactions. Give your health care provider a list of all the medicines, herbs, non-prescription drugs, or dietary supplements you use. Also tell them if you smoke, drink alcohol, or use illegal drugs. Some items may interact with your medicine. What should I watch for while using this medication? Visit your care team for regular checks on your progress. Tell your care team if your symptoms do not start to get better or if they get worse. Your condition will be monitored carefully while you are receiving this medication. You may need blood work while you are taking this medication. This medication may increase blood sugar. The risk may be higher in patients who already have diabetes. Ask your care team what you can do to lower your risk of diabetes while taking this medication. Talk to your care team if you wish to become pregnant or think you may be pregnant. This medication can cause serious birth defects. Do not breast-feed while taking this medication and for 6 months after stopping therapy. This medication may cause infertility. Talk to your care team if you are concerned about your fertility. What side effects may I notice from receiving this medication? Side effects that you should report to your care team as soon as possible: Allergic reactions--skin rash, itching, hives, swelling of the face, lips, tongue, or throat Gallbladder problems--severe stomach pain, nausea, vomiting, fever High blood sugar (hyperglycemia)--increased thirst or amount of urine, unusual weakness or fatigue, blurry vision Increase in blood pressure Low blood sugar (hypoglycemia)--tremors or shaking, anxiety, sweating, cold   or clammy skin, confusion, dizziness, rapid heartbeat Low thyroid levels (hypothyroidism)--unusual weakness or fatigue, increased sensitivity to cold,  constipation, hair loss, dry skin, weight gain, feelings of depression Slow heartbeat--dizziness, feeling faint or lightheaded, confusion, trouble breathing, unusual weakness or fatigue Side effects that usually do not require medical attention (report to your care team if they continue or are bothersome): Diarrhea Dizziness Headache Muscle spasms Nausea Pain, redness, irritation, or bruising at the injection site Stomach pain This list may not describe all possible side effects. Call your doctor for medical advice about side effects. You may report side effects to FDA at 1-800-FDA-1088. Where should I keep my medication? This medication is given in a hospital or clinic. It will not be stored at home. NOTE: This sheet is a summary. It may not cover all possible information. If you have questions about this medicine, talk to your doctor, pharmacist, or health care provider.  2023 Elsevier/Gold Standard (2021-08-12 00:00:00)  

## 2022-11-08 ENCOUNTER — Inpatient Hospital Stay: Payer: Medicare HMO

## 2022-11-08 ENCOUNTER — Inpatient Hospital Stay: Payer: Medicare HMO | Admitting: Hematology and Oncology

## 2022-11-09 ENCOUNTER — Other Ambulatory Visit: Payer: Self-pay | Admitting: Cardiology

## 2022-11-09 DIAGNOSIS — I119 Hypertensive heart disease without heart failure: Secondary | ICD-10-CM

## 2022-11-09 DIAGNOSIS — E785 Hyperlipidemia, unspecified: Secondary | ICD-10-CM

## 2022-11-09 DIAGNOSIS — I493 Ventricular premature depolarization: Secondary | ICD-10-CM

## 2022-11-09 DIAGNOSIS — I6523 Occlusion and stenosis of bilateral carotid arteries: Secondary | ICD-10-CM

## 2022-11-09 DIAGNOSIS — I25119 Atherosclerotic heart disease of native coronary artery with unspecified angina pectoris: Secondary | ICD-10-CM

## 2022-11-09 DIAGNOSIS — E118 Type 2 diabetes mellitus with unspecified complications: Secondary | ICD-10-CM

## 2022-11-09 NOTE — Telephone Encounter (Signed)
Refill sent to pharmacy, patient needs appointment for future refills 

## 2022-11-23 ENCOUNTER — Ambulatory Visit (HOSPITAL_COMMUNITY): Payer: Medicare HMO

## 2022-11-24 ENCOUNTER — Ambulatory Visit (HOSPITAL_COMMUNITY)
Admission: RE | Admit: 2022-11-24 | Discharge: 2022-11-24 | Disposition: A | Discharge status: Home / Self Care | Payer: Medicare HMO | Source: Ambulatory Visit | Attending: Hematology and Oncology | Admitting: Hematology and Oncology

## 2022-11-24 DIAGNOSIS — N281 Cyst of kidney, acquired: Secondary | ICD-10-CM | POA: Diagnosis not present

## 2022-11-24 DIAGNOSIS — R918 Other nonspecific abnormal finding of lung field: Secondary | ICD-10-CM | POA: Diagnosis not present

## 2022-11-24 DIAGNOSIS — C7A8 Other malignant neuroendocrine tumors: Secondary | ICD-10-CM | POA: Insufficient documentation

## 2022-11-24 DIAGNOSIS — C7B8 Other secondary neuroendocrine tumors: Secondary | ICD-10-CM | POA: Insufficient documentation

## 2022-11-24 MED ORDER — SODIUM CHLORIDE (PF) 0.9 % IJ SOLN
INTRAMUSCULAR | Status: AC
  Filled 2022-11-24: qty 50

## 2022-11-24 MED ORDER — IOHEXOL 300 MG/ML  SOLN
100.0000 mL | Freq: Once | INTRAMUSCULAR | Status: AC | PRN
  Administered 2022-11-24: 100 mL via INTRAVENOUS

## 2022-12-01 ENCOUNTER — Telehealth: Payer: Self-pay | Admitting: Hematology and Oncology

## 2022-12-07 ENCOUNTER — Other Ambulatory Visit: Payer: Self-pay

## 2022-12-07 ENCOUNTER — Inpatient Hospital Stay: Payer: Medicare HMO | Admitting: Hematology and Oncology

## 2022-12-07 ENCOUNTER — Inpatient Hospital Stay: Payer: Medicare HMO | Attending: Physician Assistant

## 2022-12-07 ENCOUNTER — Inpatient Hospital Stay: Payer: Medicare HMO

## 2022-12-07 VITALS — BP 131/73 | HR 58 | Temp 97.9°F | Resp 16 | Wt 179.9 lb

## 2022-12-07 DIAGNOSIS — C7B8 Other secondary neuroendocrine tumors: Secondary | ICD-10-CM | POA: Diagnosis not present

## 2022-12-07 DIAGNOSIS — C7A8 Other malignant neuroendocrine tumors: Secondary | ICD-10-CM

## 2022-12-07 LAB — CMP (CANCER CENTER ONLY)
ALT: 10 U/L (ref 0–44)
AST: 11 U/L — ABNORMAL LOW (ref 15–41)
Albumin: 3.7 g/dL (ref 3.5–5.0)
Alkaline Phosphatase: 71 U/L (ref 38–126)
Anion gap: 6 (ref 5–15)
BUN: 16 mg/dL (ref 8–23)
CO2: 28 mmol/L (ref 22–32)
Calcium: 8.7 mg/dL — ABNORMAL LOW (ref 8.9–10.3)
Chloride: 101 mmol/L (ref 98–111)
Creatinine: 1.11 mg/dL (ref 0.61–1.24)
GFR, Estimated: >60 mL/min (ref >60)
Glucose, Bld: 387 mg/dL — ABNORMAL HIGH (ref 70–99)
Potassium: 4.4 mmol/L (ref 3.5–5.1)
Sodium: 135 mmol/L (ref 135–145)
Total Bilirubin: 0.6 mg/dL (ref 0.3–1.2)
Total Protein: 6 g/dL — ABNORMAL LOW (ref 6.5–8.1)

## 2022-12-07 LAB — CBC WITH DIFFERENTIAL (CANCER CENTER ONLY)
Abs Immature Granulocytes: 0.04 10*3/uL (ref 0.00–0.07)
Basophils Absolute: 0.1 10*3/uL (ref 0.0–0.1)
Basophils Relative: 1 %
Eosinophils Absolute: 0.5 10*3/uL (ref 0.0–0.5)
Eosinophils Relative: 6 %
HCT: 37 % — ABNORMAL LOW (ref 39.0–52.0)
Hemoglobin: 13.4 g/dL (ref 13.0–17.0)
Immature Granulocytes: 1 %
Lymphocytes Relative: 7 %
Lymphs Abs: 0.6 10*3/uL — ABNORMAL LOW (ref 0.7–4.0)
MCH: 32.1 pg (ref 26.0–34.0)
MCHC: 36.2 g/dL — ABNORMAL HIGH (ref 30.0–36.0)
MCV: 88.5 fL (ref 80.0–100.0)
Monocytes Absolute: 0.8 10*3/uL (ref 0.1–1.0)
Monocytes Relative: 9 %
Neutro Abs: 6.3 10*3/uL (ref 1.7–7.7)
Neutrophils Relative %: 76 %
Platelet Count: 138 10*3/uL — ABNORMAL LOW (ref 150–400)
RBC: 4.18 MIL/uL — ABNORMAL LOW (ref 4.22–5.81)
RDW: 12.2 % (ref 11.5–15.5)
WBC Count: 8.3 10*3/uL (ref 4.0–10.5)
nRBC: 0 % (ref 0.0–0.2)

## 2022-12-07 MED ORDER — LANREOTIDE ACETATE 120 MG/0.5ML ~~LOC~~ SOLN
120.0000 mg | Freq: Once | SUBCUTANEOUS | Status: AC
  Administered 2022-12-07: 120 mg via SUBCUTANEOUS
  Filled 2022-12-07: qty 120

## 2022-12-07 NOTE — Progress Notes (Signed)
[facility] [phone]   [phone]  PROGRESS NOTE:  Patient Care Team: Ailene Ravel, MD as PCP - General (Family Medicine) Othella Boyer, MD as Attending Physician (Cardiology) Chipper Herb, MD (Inactive) as Attending Physician (Radiation Oncology) Barron Alvine, MD (Inactive) as Attending Physician (Urology)  CHIEF COMPLAINTS/PURPOSE OF CONSULTATION:  Right renal mass and peritoneal nodularity  ONCOLOGIC HISTORY: Presented to the [facility] ER on 12/11/2021 for painless hematuria and urinary urgency.  Work-up included urinalysis showed large leukocyte esterase, blood, protein, positive nitrates.  Urine culture grew E. coli.  Patient was treated with cefdinir.  He underwent CT imaging that showed a nonobstructing 6 mm stone in the interpolar left kidney, indeterminate 2.9 x 3.4 x 4.3 cm lesion in the upper pole right kidney, multiple peritoneal and omental nodules concerning for peritoneal carcinomatosis. 01/10/2022: CT chest: Incidental right lower lobe segmental and subsegmental pulmonary emboli.Minimal clot burden without right heart strain.No evidence of intrathoracic metastases.Small focus of ground-glass airspace disease within the right upper lobe likely inflammatory or infectious. Started on Eliquis therapy 01/18/2022: CT guided biopsy of peritoneal mass: Well-differentiated low-grade neuroendocrine neoplasm with features  suspicious carcinoid tumor. Ki-67 shows very low proliferative index score.  01/30/2022: PET DOTATATE showed extensive tracer avid peritoneal metastasis and tracer avid liver metastasis to the posterior right hepatic lobe. 02/01/2022: Start lanreotide monthly injections  INTERIM HISTORY:  Daniel Gibson returns today for a follow up visit after recent diagnosis of low grade neuroendocrine carcinoma.   On exam today Daniel Gibson is accompanied by his wife and son.[name]  He reports he has been tolerating the  lanreotide shots well without any major difficulties.  He does have a lot of issues with fatigue and tiredness though he does "work all day".  He notes he is been having some trouble sleeping at night as well and has requested a prescription for Lunesta.  Overall he does not have any specific complaints but has a generalized feeling of being unwell.  His appetite is strong and he is not having any nausea, vomiting, or diarrhea..  He denies fevers, chills,sweats, shortness of breath, chest pain, cough, flushing or palpitations. He has no other complaints. Rest of the 10 point ROS is below.   The bulk of our discussion focused on the results of his CT scan, our concern is that he may have a secondary tumor versus progression of disease.  The patient voices understanding of our findings and the plan moving forward as noted below.  MEDICAL HISTORY:  Past Medical History:  Diagnosis Date   Carotid artery disease (HCC) 05/08/2012   CEA on left 2013   Coronary artery disease    Diabetes mellitus without complication (HCC)    Food allergy    Alpha Gal allergy    Hyperlipidemia    Hypertensive heart disease without CHF 04/08/2018   Myocardial infarction (HCC) 1993   medically managed   Obesity (BMI 30-39.9) 04/08/2018   Prostate cancer (HCC) 08/07/2016   PVC's (premature ventricular contractions) 04/08/2018   Thrombocytopenia (HCC)     SURGICAL HISTORY: Past Surgical History:  Procedure Laterality Date   APPENDECTOMY     CAROTID ENDARTERECTOMY Right 06-07-12   cea   CORONARY ARTERY BYPASS GRAFT N/A 10/18/2020   Procedure: CORONARY ARTERY BYPASS GRAFTING (CABG)x3. LEFT INTERNAL MAMMARY ARTERY. RIGHT ENDOSCOPIC SAPHENOUS VEIN HARVESTING.;  Surgeon: Linden Dolin, MD;  Location: [facility] OR;  Service: Open Heart Surgery;  Laterality: N/A;   ENDARTERECTOMY  06/07/2012  Procedure: ENDARTERECTOMY CAROTID;  Surgeon: Chuck Hint, MD;  Location: [facility] OR;  Service: Vascular;  Laterality: Right;   Right Carotid Endarterectomy with Patch Angioplasty   LEFT HEART CATH AND CORONARY ANGIOGRAPHY N/A 10/07/2020   Procedure: LEFT HEART CATH AND CORONARY ANGIOGRAPHY;  Surgeon: Lennette Bihari, MD;  Location: [facility] INVASIVE CV LAB;  Service: Cardiovascular;  Laterality: N/A;   TEE WITHOUT CARDIOVERSION N/A 10/18/2020   Procedure: TRANSESOPHAGEAL ECHOCARDIOGRAM (TEE);  Surgeon: Linden Dolin, MD;  Location: [facility] OR;  Service: Open Heart Surgery;  Laterality: N/A;   TRANSPERINEAL IMPLANT OF RADIATION SEEDS W/ ULTRASOUND      SOCIAL HISTORY: Social History   Socioeconomic History   Marital status: Married    Spouse name: Not on file   Number of children: Not on file   Years of education: Not on file   Highest education level: Not on file  Occupational History   Not on file  Tobacco Use   Smoking status: Never   Smokeless tobacco: Never  Vaping Use   Vaping Use: Never used  Substance and Sexual Activity   Alcohol use: No   Drug use: No   Sexual activity: Not on file  Other Topics Concern   Not on file  Social History Narrative   Not on file   Social Determinants of Health   Financial Resource Strain: Not on file  Food Insecurity: Not on file  Transportation Needs: Not on file  Physical Activity: Not on file  Stress: Not on file  Social Connections: Not on file  Intimate Partner Violence: Not on file    FAMILY HISTORY: Family History  Problem Relation Age of Onset   Cancer Mother    Hyperlipidemia Mother    Hypertension Mother    Heart attack Father    Heart disease Father        Heart Disease before age 57   Hyperlipidemia Father    Hypertension Father    Diabetes Sister    Heart disease Sister        before age 64   Hypertension Sister    Hyperlipidemia Sister    Diabetes Brother    Heart disease Brother        before age 40   Hyperlipidemia Brother    Hypertension Brother     ALLERGIES:  is allergic to ace inhibitors, albumin (human), beef-derived products,  gelfoam-jmi sponge, heparin, pork-derived products, sheep-derived products, and tetanus toxoids.  MEDICATIONS:  Current Outpatient Medications  Medication Sig Dispense Refill   amLODipine (NORVASC) 10 MG tablet Take 10 mg by mouth in the morning.     apixaban (ELIQUIS) 5 MG TABS tablet Take 1 tablet (5 mg total) by mouth 2 (two) times daily. 60 tablet 5   carvedilol (COREG) 25 MG tablet Take 12.5 mg by mouth 2 (two) times daily with a meal.     EPINEPHrine (EPIPEN 2-PAK) 0.3 mg/0.3 mL IJ SOAJ injection Use as directed for life-threatening allergic reaction. 2 each 3   eszopiclone (LUNESTA) 1 MG TABS tablet Take 1 tablet (1 mg total) by mouth at bedtime as needed for sleep. Take immediately before bedtime 30 tablet 0   ezetimibe (ZETIA) 10 MG tablet Take 1 tablet (10 mg total) by mouth daily. Needs appointment for future refills 90 tablet 0   metFORMIN (GLUCOPHAGE) 1000 MG tablet Take 500 mg by mouth in the morning and at bedtime.     oxybutynin (DITROPAN) 5 MG tablet Take 5 mg by mouth. (Patient not  taking: Reported on 02/01/2022)     pantoprazole (PROTONIX) 40 MG tablet Take 1 tablet (40 mg total) by mouth daily. Needs appointment for future refills 90 tablet 0   Polyethylene Glycol 3350 (MIRALAX PO) Take 1 Package by mouth as needed (constipation).     rosuvastatin (CRESTOR) 40 MG tablet Take 1 tablet (40 mg total) by mouth daily. Needs appointment for future refills 90 tablet 0   Tamsulosin HCl (FLOMAX) 0.4 MG CAPS Take 0.8 mg by mouth at bedtime.     traZODone (DESYREL) 50 MG tablet Take 50 mg by mouth at bedtime.     TROSPIUM CHLORIDE PO Take 60 mg by mouth daily.     No current facility-administered medications for this visit.    REVIEW OF SYSTEMS:   Constitutional: ( - ) fevers, ( - )  chills , ( - ) night sweats Eyes: ( - ) blurriness of vision, ( - ) double vision, ( - ) watery eyes Ears, nose, mouth, throat, and face: ( - ) mucositis, ( - ) sore throat Respiratory: ( - ) cough, ( -  ) dyspnea, ( - ) wheezes Cardiovascular: ( - ) palpitation, ( - ) chest discomfort, ( - ) lower extremity swelling Gastrointestinal:  ( - ) nausea, ( - ) heartburn, ( - ) change in bowel habits Skin: ( - ) abnormal skin rashes Lymphatics: ( - ) new lymphadenopathy, ( - ) easy bruising Neurological: ( - ) numbness, ( - ) tingling, ( - ) new weaknesses Behavioral/Psych: ( - ) mood change, ( - ) new changes  All other systems were reviewed with the patient and are negative.  PHYSICAL EXAMINATION: ECOG PERFORMANCE STATUS: 0 - Asymptomatic  Vitals:   12/07/22 1409  BP: 131/73  Pulse: (!) 58  Resp: 16  Temp: 97.9 F (36.6 C)  SpO2: 97%     Filed Weights   12/07/22 1409  Weight: 179 lb 14.4 oz (81.6 kg)      GENERAL: well appearing Caucasian male in NAD  SKIN: skin color, texture, turgor are normal, no rashes or significant lesions EYES: conjunctiva are pink and non-injected, sclera clear LUNGS: clear to auscultation and percussion with normal breathing effort HEART: regular rate & rhythm and no murmurs and no lower extremity edema Musculoskeletal: no cyanosis of digits and no clubbing  PSYCH: alert & oriented x 3, fluent speech NEURO: no focal motor/sensory deficits  LABORATORY DATA:  I have reviewed the data as listed    Latest Ref Rng & Units 12/07/2022    1:55 PM 11/07/2022    2:57 PM 10/11/2022    2:40 PM  CBC  WBC 4.0 - 10.5 K/uL 8.3  8.4  8.0   Hemoglobin 13.0 - 17.0 g/dL 82.9  56.2  13.0   Hematocrit 39.0 - 52.0 % 37.0  39.3  37.6   Platelets 150 - 400 K/uL 138  165  144        Latest Ref Rng & Units 12/07/2022    1:55 PM 11/07/2022    2:57 PM 10/11/2022    2:40 PM  CMP  Glucose 70 - 99 mg/dL 865  784  696   BUN 8 - 23 mg/dL 16  16  10    Creatinine 0.61 - 1.24 mg/dL 2.95  2.84  1.32   Sodium 135 - 145 mmol/L 135  137  134   Potassium 3.5 - 5.1 mmol/L 4.4  4.5  4.0   Chloride 98 - 111 mmol/L 101  104  102   CO2 22 - 32 mmol/L 28  28  26    Calcium 8.9 - 10.3  mg/dL 8.7  8.8  9.0   Total Protein 6.5 - 8.1 g/dL 6.0  6.2  6.1   Total Bilirubin 0.3 - 1.2 mg/dL 0.6  0.7  0.7   Alkaline Phos 38 - 126 U/L 71  70  79   AST 15 - 41 U/L 11  10  10    ALT 0 - 44 U/L 10  8  8       RADIOGRAPHIC STUDIES: CT CHEST ABDOMEN PELVIS W CONTRAST  Result Date: 11/29/2022 CLINICAL DATA:  Neuroendocrine tumor. Liver metastases. * Tracking Code: BO * EXAM: CT CHEST, ABDOMEN, AND PELVIS WITH CONTRAST TECHNIQUE: Multidetector CT imaging of the chest, abdomen and pelvis was performed following the standard protocol during bolus administration of intravenous contrast. RADIATION DOSE REDUCTION: This exam was performed according to the departmental dose-optimization program which includes automated exposure control, adjustment of the mA and/or kV according to patient size and/or use of iterative reconstruction technique. CONTRAST:  OMNIPAQUE IOHEXOL 300 MG/ML  SOLN COMPARISON:  CT 05/24/2022 and older.  PET-CT scan 01/30/2022 FINDINGS: CT CHEST FINDINGS Cardiovascular: Status post median sternotomy. The heart is nonenlarged. No pericardial effusion. Coronary artery calcifications are seen. The thoracic aorta has a normal course and caliber with mild atherosclerotic plaque. Plaque extends also along the great vessels. Mediastinum/Nodes: Stable thyroid gland. Normal caliber thoracic esophagus which is slightly patulous. No discrete abnormal lymph node enlargement identified in the axillary region. There are some small less than 1 cm in size mediastinal and hilar nodes, nonpathologic by size criteria and not significantly changed when adjusting for technique. Lungs/Pleura: There is some linear opacity lung bases likely scar or atelectasis. Few tiny calcified nodules are seen as well consistent with old granulomatous disease. No consolidation, pneumothorax or effusion. There is a subtle nodular area of pleural thickening of the right lung base. Best seen on series 5, image 103, the coronal  data set measuring 14 by 6 mm. This has not significantly changed in retrospect from the previous examination. Musculoskeletal: Scattered degenerative changes along the spine. CT ABDOMEN PELVIS FINDINGS Hepatobiliary: Patent portal vein. Gallbladder is nondilated with some dependent stones. Previous lesion in segment 6 of the liver which measured 2.7 x 2.2 cm, today on series 2, image 49 measures 2.3 by 1.7 cm, slightly smaller. No other clear measurable liver lesion identified today Pancreas: Global atrophy of the pancreas.  No obvious mass. Spleen: On the portal venous phase dataset the spleen has some heterogeneous lesions. Example medial on series 2, image 50 measuring 2.4 x 2.2 cm and more central on image 53 of the same dataset 2.5 by 1.8 cm. These are not clearly seen previously. Recommend further evaluation. Adrenals/Urinary Tract: Adrenal glands are preserved. Bilateral renal atrophy. There are some benign Bosniak 1 and 2 renal cysts. However there is a an aggressive mass along the upper pole of the right kidney today measuring 4.5 by 4.4 cm. Slight wall thickening of the urinary bladder with trabeculation. Stomach/Bowel: Stomach and small bowel are nondilated. The colon is nondilated. Scattered colonic stool. There are some fold thickening along loops of small bowel in the right lower quadrant adjacent to the mesenteric mass. Vascular/Lymphatic: Normal caliber aorta and IVC with scattered vascular calcifications. Reproductive: Brachytherapy changes along the prostate. Other: Right lower quadrant mesenteric partially calcified soft tissue mass with retraction of mesentery and adjacent bowel loops consistent with history  of neuroendocrine tumor. Overall the dominant lesion on the prior was measured at 4.3 x 2.6 cm. Today when measured in a similar fashion for continuity the dominant lesion would measure 4.4 by 2.5 cm. Several adjacent satellite nodules seen in this location as well as peritoneal nodules  anteriorly. There appear to be some new areas identified such as in the upper anterior mesentery adjacent to the proximal transverse colon on series 2, image 64 measuring 2.7 by 2.0 cm. Musculoskeletal: Curvature of the spine. Scattered degenerative changes of the spine and pelvis. Smaller area of sclerosis along the left iliac bone on image 100 is unchanged. Possible small bone island. IMPRESSION: Right lower quadrant mesenteric soft tissue mass with dystrophic calcifications consistent with known history of neuroendocrine tumor. This lesion appears similar. However there is increasing overall areas of peritoneal carcinomatosis particularly in the right upper quadrant. Associated wall thickening along loops of bowel in the right lower quadrant with retraction. Increasing right upper renal metastasis. Subtle new lesions along the spleen are indeterminate but worrisome for metastatic disease. Recommend either short follow-up or additional workup with MRI as clinically appropriate. Previous liver lesion appears slightly smaller today. Stable right pleural nodule along the right hemidiaphragm. No obstruction, free air. No developing thoracic lymph node enlargement or mass lesion at this time. Electronically Signed   By: Karen Kays M.D.   On: 11/29/2022 15:46    ASSESSMENT & PLAN Daniel Gibson is a 72 y.o. male who presents for a follow up for newly diagnosed well differentiated neuroendocrine carcinoma, low grade.   #Well differentiated neuroendocrine carcinoma, low grade: --CT guided peritoneal mass biopsy on 01/18/2022 confirmed above diagnosis. --PET DOTATATE was completed on 01/30/2022, report pending --Recommend monthly lanreotide injection q 28 days. Reviewed dose, frequency and common side effects including injection site reaction, cholelithiasis, gallbladder sludge, hyperglycemia, hypoglycemia, thyroid dysfunction and bradycardia.  --Labs from today were reviewed and require no intervention.  Baseline Chromogranin levels were 159.7 on 02/01/2022.  PLAN: --Continue with monthly lanreotide injection q 28 days. --CBC and CMP every month before treatment. Chromagranin levels every 3 months --labs today show white blood cell 8.3, hemoglobin 13.4, MCV 88.5, and platelets of 138.  Additionally creatinine was 1.11 and AST and ALT were within normal limits. --chromogranin A last 64.1 on 12/07/22 --last CT scan on 11/24/2022 showed what appeared to be progression of certain areas, most concerning the the renal mass.  I am concerned that this may represent either a second primary or potentially progression of disease.  A second primary seems more likely given the low Ki-67 of his initially diagnosed tumor.  --RTC in 3 months for a toxicity evaluation  No orders of the defined types were placed in this encounter.   All questions were answered. The patient knows to call the clinic with any problems, questions or concerns.  I have spent a total of 30 minutes minutes of face-to-face and non-face-to-face time, preparing to see the patient,  performing a medically appropriate examination, counseling and educating the patient, ordering tests/procedures,documenting clinical information in the electronic health record, independently interpreting results and communicating results to the patient, and care coordination.  Ulysees Barns, MD Department of Hematology/Oncology [facility] Phone: [phone] Pager: [phone] Email: [email or web address]

## 2022-12-08 ENCOUNTER — Other Ambulatory Visit: Payer: Self-pay | Admitting: Cardiology

## 2022-12-08 NOTE — Telephone Encounter (Signed)
Rx sent to pharmacy, patient needs appointment for future refills  

## 2022-12-11 ENCOUNTER — Telehealth: Payer: Self-pay | Admitting: *Deleted

## 2022-12-11 ENCOUNTER — Other Ambulatory Visit: Payer: Self-pay | Admitting: Hematology and Oncology

## 2022-12-11 MED ORDER — ESZOPICLONE 1 MG PO TABS: 1.0000 mg | ORAL_TABLET | Freq: Every evening | ORAL | 0 refills | Status: DC | PRN

## 2022-12-11 NOTE — Telephone Encounter (Signed)
TCT patient and spoke with him. Advised that the Alfonso Patten has been sent to his pharmacy. Also informed patient that Dr. Leonides Schanz is working on reaching Dr. Berneice Heinrich regarding biopsy. Advised that we will let him know when we have reached Dr. Berneice Heinrich. Pt voiced understanding.

## 2022-12-11 NOTE — Telephone Encounter (Signed)
Received call from pt. He is asking if Dr. Leonides Schanz had reached out to Dr. Berneice Heinrich @ [facility] about doing a biopsy.  He is also asking about a new prescription for Lunesta that Dr. Leonides Schanz was to have sent in to his pharmacy. ([facility] [place]).  Please advise

## 2022-12-12 LAB — CHROMOGRANIN A: Chromogranin A (ng/mL): 64.1 ng/mL (ref 0.0–101.8)

## 2022-12-15 ENCOUNTER — Telehealth: Payer: Self-pay | Admitting: *Deleted

## 2022-12-15 NOTE — Telephone Encounter (Signed)
Received vm message from pt asking if Dr. Leonides Schanz has spoken with his Urologist, Dr. Berneice Heinrich. Pt also states he does not feel well but was non-specific Please advise

## 2022-12-17 ENCOUNTER — Encounter: Payer: Self-pay | Admitting: Hematology and Oncology

## 2022-12-19 ENCOUNTER — Encounter: Payer: Self-pay | Admitting: Hematology and Oncology

## 2023-01-01 DIAGNOSIS — N5201 Erectile dysfunction due to arterial insufficiency: Secondary | ICD-10-CM | POA: Diagnosis not present

## 2023-01-01 DIAGNOSIS — R3915 Urgency of urination: Secondary | ICD-10-CM | POA: Diagnosis not present

## 2023-01-01 DIAGNOSIS — C61 Malignant neoplasm of prostate: Secondary | ICD-10-CM | POA: Diagnosis not present

## 2023-01-01 DIAGNOSIS — N35919 Unspecified urethral stricture, male, unspecified site: Secondary | ICD-10-CM | POA: Diagnosis not present

## 2023-01-01 DIAGNOSIS — D49511 Neoplasm of unspecified behavior of right kidney: Secondary | ICD-10-CM | POA: Diagnosis not present

## 2023-01-02 ENCOUNTER — Other Ambulatory Visit: Payer: Self-pay | Admitting: Urology

## 2023-01-02 ENCOUNTER — Telehealth: Payer: Self-pay

## 2023-01-02 NOTE — Telephone Encounter (Signed)
Dr. Emmaline Life office called requesting approval for patient to hold Eliquis for 48 hours prior to renal biopsy scheduled for next Wednesday, 7/17.  Per Dr. Leonides Schanz, okay "to hold eliquis 48 hour prior to procedure and restart when urology says it is OK".

## 2023-01-04 ENCOUNTER — Inpatient Hospital Stay: Payer: Medicare HMO

## 2023-01-04 ENCOUNTER — Inpatient Hospital Stay: Payer: Medicare HMO | Attending: Physician Assistant | Admitting: Hematology and Oncology

## 2023-01-04 VITALS — BP 136/66 | HR 63 | Temp 97.8°F | Resp 13 | Wt 180.0 lb

## 2023-01-04 DIAGNOSIS — C7A8 Other malignant neuroendocrine tumors: Secondary | ICD-10-CM | POA: Diagnosis not present

## 2023-01-04 DIAGNOSIS — K669 Disorder of peritoneum, unspecified: Secondary | ICD-10-CM | POA: Diagnosis not present

## 2023-01-04 DIAGNOSIS — C7B8 Other secondary neuroendocrine tumors: Secondary | ICD-10-CM | POA: Diagnosis not present

## 2023-01-04 LAB — CBC WITH DIFFERENTIAL (CANCER CENTER ONLY)
Abs Immature Granulocytes: 0.03 10*3/uL (ref 0.00–0.07)
Basophils Absolute: 0.1 10*3/uL (ref 0.0–0.1)
Basophils Relative: 1 %
Eosinophils Absolute: 0.4 10*3/uL (ref 0.0–0.5)
Eosinophils Relative: 5 %
HCT: 35.8 % — ABNORMAL LOW (ref 39.0–52.0)
Hemoglobin: 13 g/dL (ref 13.0–17.0)
Immature Granulocytes: 0 %
Lymphocytes Relative: 7 %
Lymphs Abs: 0.6 10*3/uL — ABNORMAL LOW (ref 0.7–4.0)
MCH: 32.2 pg (ref 26.0–34.0)
MCHC: 36.3 g/dL — ABNORMAL HIGH (ref 30.0–36.0)
MCV: 88.6 fL (ref 80.0–100.0)
Monocytes Absolute: 0.7 10*3/uL (ref 0.1–1.0)
Monocytes Relative: 9 %
Neutro Abs: 6.5 10*3/uL (ref 1.7–7.7)
Neutrophils Relative %: 78 %
Platelet Count: 128 10*3/uL — ABNORMAL LOW (ref 150–400)
RBC: 4.04 MIL/uL — ABNORMAL LOW (ref 4.22–5.81)
RDW: 12 % (ref 11.5–15.5)
WBC Count: 8.4 10*3/uL (ref 4.0–10.5)
nRBC: 0 % (ref 0.0–0.2)

## 2023-01-04 LAB — CMP (CANCER CENTER ONLY)
ALT: 11 U/L (ref 0–44)
AST: 14 U/L — ABNORMAL LOW (ref 15–41)
Albumin: 3.9 g/dL (ref 3.5–5.0)
Alkaline Phosphatase: 57 U/L (ref 38–126)
Anion gap: 6 (ref 5–15)
BUN: 23 mg/dL (ref 8–23)
CO2: 26 mmol/L (ref 22–32)
Calcium: 9.3 mg/dL (ref 8.9–10.3)
Chloride: 102 mmol/L (ref 98–111)
Creatinine: 1.31 mg/dL — ABNORMAL HIGH (ref 0.61–1.24)
GFR, Estimated: 58 mL/min — ABNORMAL LOW (ref >60)
Glucose, Bld: 269 mg/dL — ABNORMAL HIGH (ref 70–99)
Potassium: 4.3 mmol/L (ref 3.5–5.1)
Sodium: 134 mmol/L — ABNORMAL LOW (ref 135–145)
Total Bilirubin: 0.7 mg/dL (ref 0.3–1.2)
Total Protein: 6.1 g/dL — ABNORMAL LOW (ref 6.5–8.1)

## 2023-01-04 MED ORDER — LANREOTIDE ACETATE 120 MG/0.5ML ~~LOC~~ SOLN
120.0000 mg | Freq: Once | SUBCUTANEOUS | Status: AC
  Administered 2023-01-04: 120 mg via SUBCUTANEOUS
  Filled 2023-01-04: qty 120

## 2023-01-04 NOTE — Progress Notes (Signed)
[facility] [phone]   Fax:([phone]) [phone]  PROGRESS NOTE:  Patient Care Team: Ailene Ravel, MD as PCP - General (Family Medicine) Othella Boyer, MD as Attending Physician (Cardiology) Chipper Herb, MD (Inactive) as Attending Physician (Radiation Oncology) Barron Alvine, MD (Inactive) as Attending Physician (Urology)  CHIEF COMPLAINTS/PURPOSE OF CONSULTATION:  Right renal mass and peritoneal nodularity  ONCOLOGIC HISTORY: Presented to the [facility] ER on 12/11/2021 for painless hematuria and urinary urgency.  Work-up included urinalysis showed large leukocyte esterase, blood, protein, positive nitrates.  Urine culture grew E. coli.  Patient was treated with cefdinir.  He underwent CT imaging that showed a nonobstructing 6 mm stone in the interpolar left kidney, indeterminate 2.9 x 3.4 x 4.3 cm lesion in the upper pole right kidney, multiple peritoneal and omental nodules concerning for peritoneal carcinomatosis. 01/10/2022: CT chest: Incidental right lower lobe segmental and subsegmental pulmonary emboli.Minimal clot burden without right heart strain.No evidence of intrathoracic metastases.Small focus of ground-glass airspace disease within the right upper lobe likely inflammatory or infectious. Started on Eliquis therapy 01/18/2022: CT guided biopsy of peritoneal mass: Well-differentiated low-grade neuroendocrine neoplasm with features  suspicious carcinoid tumor. Ki-67 shows very low proliferative index score.  01/30/2022: PET DOTATATE showed extensive tracer avid peritoneal metastasis and tracer avid liver metastasis to the posterior right hepatic lobe. 02/01/2022: Start lanreotide monthly injections  INTERIM HISTORY:  Daniel Gibson returns today for a follow up visit for a low grade neuroendocrine carcinoma.   On exam today Daniel Gibson is accompanied by his wife and son.  He reports he has been well overall in the interim since her last  visit.  He has not been having any trouble with nausea, vomiting, or diarrhea.  He reports he is having a little bit of pain in his back at the site of his kidney.  He notes that he does continue to have some back pain.  Otherwise his appetite is good and he is tolerating his lanreotide shot well.  He notes he is not having any injection site reactions, redness, or itching.  Overall he is eager to have a biopsy of the kidney mass in order to assure it is not a secondary process.[name]  He denies fevers, chills,sweats, shortness of breath, chest pain, cough, flushing or palpitations. He has no other complaints. Rest of the 10 point ROS is below.    MEDICAL HISTORY:  Past Medical History:  Diagnosis Date   Carotid artery disease (HCC) 05/08/2012   CEA on left 2013   Coronary artery disease    Diabetes mellitus without complication (HCC)    Food allergy    Alpha Gal allergy    GERD (gastroesophageal reflux disease)    Hyperlipidemia    Hypertensive heart disease without CHF 04/08/2018   Myocardial infarction [facility]) 1993   medically managed   Neuroendocrine carcinoma metastatic to liver (HCC)    Obesity (BMI 30-39.9) 04/08/2018   Prostate cancer (HCC) 08/07/2016   Pulmonary embolism (HCC)    PVC's (premature ventricular contractions) 04/08/2018   Thrombocytopenia (HCC)     SURGICAL HISTORY: Past Surgical History:  Procedure Laterality Date   APPENDECTOMY     CAROTID ENDARTERECTOMY Right 06-07-12   cea   CORONARY ARTERY BYPASS GRAFT N/A 10/18/2020   Procedure: CORONARY ARTERY BYPASS GRAFTING (CABG)x3. LEFT INTERNAL MAMMARY ARTERY. RIGHT ENDOSCOPIC SAPHENOUS VEIN HARVESTING.;  Surgeon: Linden Dolin, MD;  Location: [facility] OR;  Service: Open Heart Surgery;  Laterality: N/A;   CYSTOSCOPY WITH  RETROGRADE PYELOGRAM, URETEROSCOPY AND STENT PLACEMENT Right 01/10/2023   Procedure: CYSTOSCOPY WITH RETROGRADE PYELOGRAM, RIGHT DIAGNOSTIC URETEROSCOPY;  Surgeon: Loletta Parish., MD;  Location: [facility] ORS;   Service: Urology;  Laterality: Right;   ENDARTERECTOMY  06/07/2012   Procedure: ENDARTERECTOMY CAROTID;  Surgeon: Chuck Hint, MD;  Location: [facility] OR;  Service: Vascular;  Laterality: Right;  Right Carotid Endarterectomy with Patch Angioplasty   LEFT HEART CATH AND CORONARY ANGIOGRAPHY N/A 10/07/2020   Procedure: LEFT HEART CATH AND CORONARY ANGIOGRAPHY;  Surgeon: Lennette Bihari, MD;  Location: [facility] INVASIVE CV LAB;  Service: Cardiovascular;  Laterality: N/A;   TEE WITHOUT CARDIOVERSION N/A 10/18/2020   Procedure: TRANSESOPHAGEAL ECHOCARDIOGRAM (TEE);  Surgeon: Linden Dolin, MD;  Location: [facility] OR;  Service: Open Heart Surgery;  Laterality: N/A;   TRANSPERINEAL IMPLANT OF RADIATION SEEDS W/ ULTRASOUND      SOCIAL HISTORY: Social History   Socioeconomic History   Marital status: Married    Spouse name: Not on file   Number of children: Not on file   Years of education: Not on file   Highest education level: Not on file  Occupational History   Not on file  Tobacco Use   Smoking status: Never   Smokeless tobacco: Never  Vaping Use   Vaping status: Never Used  Substance and Sexual Activity   Alcohol use: No   Drug use: No   Sexual activity: Not Currently  Other Topics Concern   Not on file  Social History Narrative   Not on file   Social Determinants of Health   Financial Resource Strain: Not on file  Food Insecurity: Not on file  Transportation Needs: Not on file  Physical Activity: Not on file  Stress: Not on file  Social Connections: Not on file  Intimate Partner Violence: Not on file    FAMILY HISTORY: Family History  Problem Relation Age of Onset   Cancer Mother    Hyperlipidemia Mother    Hypertension Mother    Heart attack Father    Heart disease Father        Heart Disease before age 35   Hyperlipidemia Father    Hypertension Father    Diabetes Sister    Heart disease Sister        before age 44   Hypertension Sister    Hyperlipidemia Sister     Diabetes Brother    Heart disease Brother        before age 80   Hyperlipidemia Brother    Hypertension Brother     ALLERGIES:  is allergic to ace inhibitors, albumin (human), beef-derived products, gelfoam-jmi sponge, heparin, pork-derived products, sheep-derived products, and tetanus toxoids.  MEDICATIONS:  Current Outpatient Medications  Medication Sig Dispense Refill   amLODipine (NORVASC) 10 MG tablet Take 10 mg by mouth in the morning.     apixaban (ELIQUIS) 5 MG TABS tablet Take 1 tablet (5 mg total) by mouth 2 (two) times daily. 60 tablet 5   carvedilol (COREG) 25 MG tablet Take 12.5 mg by mouth 2 (two) times daily with a meal.     EPINEPHrine (EPIPEN 2-PAK) 0.3 mg/0.3 mL IJ SOAJ injection Use as directed for life-threatening allergic reaction. 2 each 3   eszopiclone (LUNESTA) 1 MG TABS tablet Take 1 tablet (1 mg total) by mouth at bedtime as needed for sleep. Take immediately before bedtime (Patient not taking: Reported on 01/08/2023) 30 tablet 0   ezetimibe (ZETIA) 10 MG tablet Take 1 tablet (10  mg total) by mouth daily. Needs appointment for future refills 90 tablet 0   fluticasone (FLONASE) 50 MCG/ACT nasal spray Place 2 sprays into both nostrils daily.     losartan (COZAAR) 25 MG tablet Take 25 mg by mouth daily.     metFORMIN (GLUCOPHAGE) 1000 MG tablet Take 1,000 mg by mouth 2 (two) times daily with a meal.     oxyCODONE-acetaminophen (PERCOCET) 5-325 MG tablet Take 1 tablet by mouth every 6 (six) hours as needed for severe pain or moderate pain (post-operatively). 10 tablet 0   pantoprazole (PROTONIX) 40 MG tablet Take 1 tablet (40 mg total) by mouth daily. Needs appointment for future refills 90 tablet 0   polyethylene glycol (MIRALAX / GLYCOLAX) 17 g packet Take 17 g by mouth daily as needed (constipation).     rosuvastatin (CRESTOR) 40 MG tablet Take 1 tablet (40 mg total) by mouth daily. Needs appointment for future refills 90 tablet 0   senna-docusate (SENOKOT-S)  8.6-50 MG tablet Take 1 tablet by mouth 2 (two) times daily. While taking stong pain meds to prevent constipaiton 10 tablet 0   Tamsulosin HCl (FLOMAX) 0.4 MG CAPS Take 0.8 mg by mouth at bedtime.     traZODone (DESYREL) 50 MG tablet Take 50 mg by mouth at bedtime as needed for sleep.     Trospium Chloride 60 MG CP24 Take 1 capsule by mouth daily.     No current facility-administered medications for this visit.    REVIEW OF SYSTEMS:   Constitutional: ( - ) fevers, ( - )  chills , ( - ) night sweats Eyes: ( - ) blurriness of vision, ( - ) double vision, ( - ) watery eyes Ears, nose, mouth, throat, and face: ( - ) mucositis, ( - ) sore throat Respiratory: ( - ) cough, ( - ) dyspnea, ( - ) wheezes Cardiovascular: ( - ) palpitation, ( - ) chest discomfort, ( - ) lower extremity swelling Gastrointestinal:  ( - ) nausea, ( - ) heartburn, ( - ) change in bowel habits Skin: ( - ) abnormal skin rashes Lymphatics: ( - ) new lymphadenopathy, ( - ) easy bruising Neurological: ( - ) numbness, ( - ) tingling, ( - ) new weaknesses Behavioral/Psych: ( - ) mood change, ( - ) new changes  All other systems were reviewed with the patient and are negative.  PHYSICAL EXAMINATION: ECOG PERFORMANCE STATUS: 0 - Asymptomatic  Vitals:   01/04/23 1420  BP: 136/66  Pulse: 63  Resp: 13  Temp: 97.8 F (36.6 C)  SpO2: 97%   Filed Weights   01/04/23 1420  Weight: 180 lb (81.6 kg)    GENERAL: well appearing Caucasian male in NAD  SKIN: skin color, texture, turgor are normal, no rashes or significant lesions EYES: conjunctiva are pink and non-injected, sclera clear LUNGS: clear to auscultation and percussion with normal breathing effort HEART: regular rate & rhythm and no murmurs and no lower extremity edema Musculoskeletal: no cyanosis of digits and no clubbing  PSYCH: alert & oriented x 3, fluent speech NEURO: no focal motor/sensory deficits  LABORATORY DATA:  I have reviewed the data as listed     Latest Ref Rng & Units 01/05/2023    2:36 PM 01/04/2023    1:42 PM 12/07/2022    1:55 PM  CBC  WBC 4.0 - 10.5 K/uL 8.0  8.4  8.3   Hemoglobin 13.0 - 17.0 g/dL 41.3  24.4  01.0   Hematocrit 39.0 -  52.0 % 39.9  35.8  37.0   Platelets 150 - 400 K/uL 144  128  138        Latest Ref Rng & Units 01/05/2023    2:36 PM 01/04/2023    1:42 PM 12/07/2022    1:55 PM  CMP  Glucose 70 - 99 mg/dL 098  119  147   BUN 8 - 23 mg/dL 17  23  16    Creatinine 0.61 - 1.24 mg/dL 8.29  5.62  1.30   Sodium 135 - 145 mmol/L 134  134  135   Potassium 3.5 - 5.1 mmol/L 4.0  4.3  4.4   Chloride 98 - 111 mmol/L 104  102  101   CO2 22 - 32 mmol/L 24  26  28    Calcium 8.9 - 10.3 mg/dL 8.9  9.3  8.7   Total Protein 6.5 - 8.1 g/dL 6.9  6.1  6.0   Total Bilirubin 0.3 - 1.2 mg/dL 1.1  0.7  0.6   Alkaline Phos 38 - 126 U/L 60  57  71   AST 15 - 41 U/L 18  14  11    ALT 0 - 44 U/L 16  11  10       RADIOGRAPHIC STUDIES: DG C-Arm 1-60 Min-No Report  Result Date: 01/10/2023 Fluoroscopy was utilized by the requesting physician.  No radiographic interpretation.    ASSESSMENT & PLAN Daniel Gibson is a 72 y.o. male who presents for a follow up for newly diagnosed well differentiated neuroendocrine carcinoma, low grade.   #Well differentiated neuroendocrine carcinoma, low grade: --CT guided peritoneal mass biopsy on 01/18/2022 confirmed above diagnosis. --PET DOTATATE was completed on 01/30/2022, report pending --Recommend monthly lanreotide injection q 28 days. Reviewed dose, frequency and common side effects including injection site reaction, cholelithiasis, gallbladder sludge, hyperglycemia, hypoglycemia, thyroid dysfunction and bradycardia.  --Labs from today were reviewed and require no intervention. Baseline Chromogranin levels were 159.7 on 02/01/2022.  PLAN: --Continue with monthly lanreotide injection q 28 days. --CBC and CMP every month before treatment. Chromagranin levels every 3 months --labs today show white  blood cell 8.0, hemoglobin 30.7, MCV 89.5, and platelets of 144 additionally creatinine was 1.01 and AST and ALT were within normal limits. --chromogranin A last 64.1 on 12/07/22 --last CT scan on 11/24/2022 showed what appeared to be progression of certain areas, most concerning the the renal mass.  I am concerned that this may represent either a second primary or potentially progression of disease.  A second primary seems more likely given the low Ki-67 of his initially diagnosed tumor.  --Urology currently scheduled patient for a biopsy of renal mass. --RTC in 2 months for a toxicity evaluation and continued follow up of his kidney mass workup.   No orders of the defined types were placed in this encounter.   All questions were answered. The patient knows to call the clinic with any problems, questions or concerns.  I have spent a total of 30 minutes minutes of face-to-face and non-face-to-face time, preparing to see the patient,  performing a medically appropriate examination, counseling and educating the patient, ordering tests/procedures,documenting clinical information in the electronic health record, independently interpreting results and communicating results to the patient, and care coordination.  Ulysees Barns, MD Department of Hematology/Oncology [facility] Phone: [phone] Pager: [phone] Email: [email or web address]

## 2023-01-04 NOTE — Patient Instructions (Addendum)
SURGICAL WAITING ROOM VISITATION Patients having surgery or a procedure may have no more than 2 support people in the waiting area - these visitors may rotate in the visitor waiting room.   Due to an increase in RSV and influenza rates and associated hospitalizations, children ages 18 and under may not visit patients in [facility] hospitals. If the patient needs to stay at the hospital during part of their recovery, the visitor guidelines for inpatient rooms apply.  PRE-OP VISITATION  Pre-op nurse will coordinate an appropriate time for 1 support person to accompany the patient in pre-op.  This support person may not rotate.  This visitor will be contacted when the time is appropriate for the visitor to come back in the pre-op area.  Please refer to the [facility] website for the visitor guidelines for Inpatients (after your surgery is over and you are in a regular room).  You are not required to quarantine at this time prior to your surgery. However, you must do this: Hand Hygiene often Do NOT share personal items Notify your provider if you are in close contact with someone who has COVID or you develop fever 100.4 or greater, new onset of sneezing, cough, sore throat, shortness of breath or body aches.  If you test positive for Covid or have been in contact with anyone that has tested positive in the last 10 days please notify you surgeon.    Your procedure is scheduled on:  Wednesday  [id number], 2024  Report to [facility] Main Entrance: [facility]sen entrance where the [employer] is available.   Report to admitting at:  12:45 PM  Call this number if you have any questions or problems the morning of surgery [phone]  DO NOT EAT OR DRINK ANYTHING AFTER MIDNIGHT THE NIGHT PRIOR TO YOUR SURGERY / PROCEDURE.   FOLLOW BOWEL PREP AND ANY ADDITIONAL PRE OP INSTRUCTIONS YOU RECEIVED FROM YOUR SURGEON'S OFFICE!!!   Oral Hygiene is also important to reduce your risk of infection.         Remember - BRUSH YOUR TEETH THE MORNING OF SURGERY WITH YOUR REGULAR TOOTHPASTE  Do NOT smoke after Midnight the night before surgery.    ELIQUIS- Hold x 48 hours prior to surgery.   METFORMIN-  You may take Metformin the DAY BEFORE your surgery but DO NOT TAKE THE MORNING OF YOUR SURGERY  Take ONLY these medicines the morning of surgery with A SIP OF WATER: Pantoprazole (Protonix), amlodipine, carvedilol.  You may use Fluticasone (Flonase) nasal spray if needed                     You may not have any metal on your body including  jewelry, and body piercing  Do not wear lotions, powders,  cologne, or deodorant   Men may shave face and neck.  Contacts, Hearing Aids, dentures or bridgework may not be worn into surgery. DENTURES WILL BE REMOVED PRIOR TO SURGERY PLEASE DO NOT APPLY "Poly grip" OR ADHESIVES!!!  You may bring a small overnight bag with you on the day of surgery, only pack items that are not valuable. [facility] IS NOT RESPONSIBLE   FOR VALUABLES THAT ARE LOST OR STOLEN.   Patients discharged on the day of surgery will not be allowed to drive home.  Someone NEEDS to stay with you for the first 24 hours after anesthesia.  Do not bring your home medications to the hospital. The Pharmacy will dispense medications listed on  your medication list to you during your admission in the Hospital.  Special Instructions: Bring a copy of your healthcare power of attorney and living will documents the day of surgery, if you wish to have them scanned into your  Medical Records- EPIC  Please read over the following fact sheets you were given: IF YOU HAVE QUESTIONS ABOUT YOUR PRE-OP INSTRUCTIONS, PLEASE CALL [phone].   [place] - Preparing for Surgery Before surgery, you can play an important role.  Because skin is not sterile, your skin needs to be as free of germs as possible.  You can reduce the number of germs on your skin by washing with CHG (chlorahexidine  gluconate) soap before surgery.  CHG is an antiseptic cleaner which kills germs and bonds with the skin to continue killing germs even after washing. Please DO NOT use if you have an allergy to CHG or antibacterial soaps.  If your skin becomes reddened/irritated stop using the CHG and inform your nurse when you arrive at Short Stay. Do not shave (including legs and underarms) for at least 48 hours prior to the first CHG shower.  You may shave your face/neck.  Please follow these instructions carefully:  1.  Shower with CHG Soap the night before surgery and the  morning of surgery.  2.  If you choose to wash your hair, wash your hair first as usual with your normal  shampoo.  3.  After you shampoo, rinse your hair and body thoroughly to remove the shampoo.                             4.  Use CHG as you would any other liquid soap.  You can apply chg directly to the skin and wash.  Gently with a scrungie or clean washcloth.  5.  Apply the CHG Soap to your body ONLY FROM THE NECK DOWN.   Do not use on face/ open                           Wound or open sores. Avoid contact with eyes, ears mouth and genitals (private parts).                       Wash face,  Genitals (private parts) with your normal soap.             6.  Wash thoroughly, paying special attention to the area where your  surgery  will be performed.  7.  Thoroughly rinse your body with warm water from the neck down.  8.  DO NOT shower/wash with your normal soap after using and rinsing off the CHG Soap.            9.  Pat yourself dry with a clean towel.            10.  Wear clean pajamas.            11.  Place clean sheets on your bed the night of your first shower and do not  sleep with pets.  ON THE DAY OF SURGERY : Do not apply any lotions/deodorants the morning of surgery.  Please wear clean clothes to the hospital/surgery center.    FAILURE TO FOLLOW THESE INSTRUCTIONS MAY RESULT IN THE CANCELLATION OF YOUR SURGERY  PATIENT  SIGNATURE_________________________________  NURSE SIGNATURE__________________________________  ________________________________________________________________________

## 2023-01-04 NOTE — Progress Notes (Addendum)
COVID Vaccine received:  []  No [x]  Yes Date of any COVID positive Test in last 90 days:   None  PCP -Burnell Blanks, MD   Cardiologist - Norman Herrlich, MD Oncology- Jeanie Sewer IV, MD  Chest x-ray -  EKG -  01-10-2022    will repeat at PST Stress Test -  ECHO - 10-15-2020  Epic Cardiac Cath - 10-07-2020  Dr Nicki Guadalajara  PCR screen: []  Ordered & Completed           []   No Order but Needs PROFEND           [x]   N/A for this surgery  Surgery Plan:  [x]  Ambulatory                            []  Outpatient in bed                            []  Admit  Anesthesia:    [x]  General  []  Spinal                           []   Choice []   MAC  Bowel Prep - [x]  No  []   Yes ______  Pacemaker / ICD device [x]  No []  Yes   Spinal Cord Stimulator:[x]  No []  Yes       History of Sleep Apnea? [x]  No []  Yes   CPAP used?- [x]  No []  Yes    Does the patient monitor blood sugar?          [x]  No []  Yes  []  N/A  Patient has: []  NO Hx DM   []  Pre-DM                []  DM1  [x]   DM2 Last A1c was 6.8  on 12-27-2021 in [facility], on 10-15-20 was 8.1 in Epic Does patient have a [employer] or Dexacom? [x]  No []  Yes   Fasting Blood Sugar Ranges-  Checks Blood Sugar _0 times a day   Diabetic medications/ instructions: Metformin 500 mg q am;  Hold DOS  Blood Thinner / Instructions: Eliquis,  Hold x 48 hours, okay per Dr. Leonides Schanz in 01-02-2023 phone note  Aspirin Instructions: none  ERAS Protocol Ordered: [x]  No  []  Yes Patient is to be NPO after: Midnight prior  Activity level: Patient is able to climb a flight of stairs without difficulty; [x]  No CP  [x]  No SOB, but would be very tired.  Patient can perform ADLs without assistance.   Anesthesia review: CAD-CABG x3 (10-18-2020), DM2, HTN, Thrombocytopenia, Hx PE, Neuroendocrine Carcinoma w/ liver mets,   Patient denies shortness of breath, fever, cough and chest pain at PAT appointment.  Patient verbalized understanding and agreement to the Pre-Surgical Instructions  that were given to them at this PAT appointment. Patient was also educated of the need to review these PAT instructions again prior to his surgery.I reviewed the appropriate phone numbers to call if they have any and questions or concerns.

## 2023-01-05 ENCOUNTER — Encounter (HOSPITAL_COMMUNITY)
Admission: RE | Admit: 2023-01-05 | Discharge: 2023-01-05 | Disposition: A | Discharge status: Home / Self Care | Payer: Medicare HMO | Source: Ambulatory Visit | Attending: Urology | Admitting: Urology

## 2023-01-05 ENCOUNTER — Encounter (HOSPITAL_COMMUNITY): Payer: Self-pay

## 2023-01-05 ENCOUNTER — Other Ambulatory Visit: Payer: Self-pay

## 2023-01-05 VITALS — BP 130/67 | HR 64 | Temp 98.7°F | Resp 16 | Ht 70.0 in | Wt 171.0 lb

## 2023-01-05 DIAGNOSIS — C7A8 Other malignant neuroendocrine tumors: Secondary | ICD-10-CM | POA: Insufficient documentation

## 2023-01-05 DIAGNOSIS — E119 Type 2 diabetes mellitus without complications: Secondary | ICD-10-CM | POA: Insufficient documentation

## 2023-01-05 DIAGNOSIS — C7B8 Other secondary neuroendocrine tumors: Secondary | ICD-10-CM | POA: Diagnosis not present

## 2023-01-05 DIAGNOSIS — I2510 Atherosclerotic heart disease of native coronary artery without angina pectoris: Secondary | ICD-10-CM | POA: Diagnosis not present

## 2023-01-05 DIAGNOSIS — K769 Liver disease, unspecified: Secondary | ICD-10-CM | POA: Diagnosis not present

## 2023-01-05 HISTORY — DX: Other pulmonary embolism without acute cor pulmonale: I26.99

## 2023-01-05 HISTORY — DX: Other secondary neuroendocrine tumors: C7B.8

## 2023-01-05 HISTORY — DX: Gastro-esophageal reflux disease without esophagitis: K21.9

## 2023-01-05 HISTORY — DX: Other malignant neuroendocrine tumors: C7A.8

## 2023-01-05 LAB — COMPREHENSIVE METABOLIC PANEL
ALT: 16 U/L (ref 0–44)
AST: 18 U/L (ref 15–41)
Albumin: 4.2 g/dL (ref 3.5–5.0)
Alkaline Phosphatase: 60 U/L (ref 38–126)
Anion gap: 6 (ref 5–15)
BUN: 17 mg/dL (ref 8–23)
CO2: 24 mmol/L (ref 22–32)
Calcium: 8.9 mg/dL (ref 8.9–10.3)
Chloride: 104 mmol/L (ref 98–111)
Creatinine, Ser: 1.01 mg/dL (ref 0.61–1.24)
GFR, Estimated: >60 mL/min (ref >60)
Glucose, Bld: 234 mg/dL — ABNORMAL HIGH (ref 70–99)
Potassium: 4 mmol/L (ref 3.5–5.1)
Sodium: 134 mmol/L — ABNORMAL LOW (ref 135–145)
Total Bilirubin: 1.1 mg/dL (ref 0.3–1.2)
Total Protein: 6.9 g/dL (ref 6.5–8.1)

## 2023-01-05 LAB — CBC
HCT: 39.9 % (ref 39.0–52.0)
Hemoglobin: 13.7 g/dL (ref 13.0–17.0)
MCH: 30.7 pg (ref 26.0–34.0)
MCHC: 34.3 g/dL (ref 30.0–36.0)
MCV: 89.5 fL (ref 80.0–100.0)
Platelets: 144 10*3/uL — ABNORMAL LOW (ref 150–400)
RBC: 4.46 MIL/uL (ref 4.22–5.81)
RDW: 12.1 % (ref 11.5–15.5)
WBC: 8 10*3/uL (ref 4.0–10.5)
nRBC: 0 % (ref 0.0–0.2)

## 2023-01-05 LAB — GLUCOSE, CAPILLARY: Glucose-Capillary: 246 mg/dL — ABNORMAL HIGH (ref 70–99)

## 2023-01-06 LAB — HEMOGLOBIN A1C
Hgb A1c MFr Bld: 10.2 % — ABNORMAL HIGH (ref 4.8–5.6)
Mean Plasma Glucose: 246 mg/dL

## 2023-01-08 ENCOUNTER — Telehealth: Payer: Self-pay

## 2023-01-08 ENCOUNTER — Telehealth: Payer: Self-pay | Admitting: Cardiology

## 2023-01-08 NOTE — Telephone Encounter (Signed)
   Name: Daniel Gibson  DOB: [birth year]  MRN: [id number]  Primary Cardiologist: Dr. Dulce Sellar   Preoperative team, please contact this patient and set up a phone call appointment for further preoperative risk assessment. Please obtain consent and complete medication review. Thank you for your help.  I confirm that guidance regarding antiplatelet and oral anticoagulation therapy has been completed and, if necessary, noted below.  None requested.   Ronney Asters, NP 01/08/2023, 3:43 PM [facility]

## 2023-01-08 NOTE — Telephone Encounter (Signed)
Patient scheduled for telehealth visit on 01/09/23. Med rec and consent done

## 2023-01-08 NOTE — Progress Notes (Signed)
Anesthesia Chart Review   Case: [id number] Date/Time: 01/10/23 1515   Procedure: CYSTOSCOPY WITH RETROGRADE PYELOGRAM, URETEROSCOPY WITH BIOPSY OF RENAL MASS AND STENT PLACEMENT (Right)   Anesthesia type: General   Pre-op diagnosis: RIGHT RENAL MASS   Location: [facility]OM 03 / [facility]   Surgeons: Loletta Parish., MD       DISCUSSION:72 y.o. never smoker with h/o DM II, PE, CAD (CABG), CHF, prostate cancer, right renal mass scheduled for above procedure 01/10/2023 with Dr. Sebastian Ache.   Pt last seen by cardiology 01/31/2022. 6 month follow up recommended. Cardiac clearance requested.  VS: BP 130/67 Comment: right arm sitting  Pulse 64   Temp 37.1 C (Oral)   Resp 16   Ht 5\' 10"  (1.778 m)   Wt 77.6 kg   SpO2 98%   BMI 24.54 kg/m   PROVIDERS: Hamrick, Maura L, MD is PCP    LABS:  forwarded to PCP and surgeon (all labs ordered are listed, but only abnormal results are displayed)  Labs Reviewed  HEMOGLOBIN A1C - Abnormal; Notable for the following components:      Result Value   Hgb A1c MFr Bld 10.2 (*)    All other components within normal limits  COMPREHENSIVE METABOLIC PANEL - Abnormal; Notable for the following components:   Sodium 134 (*)    Glucose, Bld 234 (*)    All other components within normal limits  CBC - Abnormal; Notable for the following components:   Platelets 144 (*)    All other components within normal limits  GLUCOSE, CAPILLARY - Abnormal; Notable for the following components:   Glucose-Capillary 246 (*)    All other components within normal limits     IMAGES:   EKG:   CV: Echo 10/15/20 1. Left ventricular ejection fraction, by estimation, is 55 to 60%. The  left ventricle has normal function. The left ventricle has no regional  wall motion abnormalities. Left ventricular diastolic parameters were  normal.   2. Right ventricular systolic function is normal. The right ventricular  size is normal. There is normal pulmonary artery systolic  pressure.   3. The mitral valve is normal in structure. Trivial mitral valve  regurgitation.   4. The aortic valve is normal in structure. Aortic valve regurgitation is  not visualized.  Past Medical History:  Diagnosis Date   Carotid artery disease (HCC) 05/08/2012   CEA on left 2013   Coronary artery disease    Diabetes mellitus without complication (HCC)    Food allergy    Alpha Gal allergy    GERD (gastroesophageal reflux disease)    Hyperlipidemia    Hypertensive heart disease without CHF 04/08/2018   Myocardial infarction [facility]) 1993   medically managed   Neuroendocrine carcinoma metastatic to liver (HCC)    Obesity (BMI 30-39.9) 04/08/2018   Prostate cancer (HCC) 08/07/2016   Pulmonary embolism (HCC)    PVC's (premature ventricular contractions) 04/08/2018   Thrombocytopenia (HCC)     Past Surgical History:  Procedure Laterality Date   APPENDECTOMY     CAROTID ENDARTERECTOMY Right 06-07-12   cea   CORONARY ARTERY BYPASS GRAFT N/A 10/18/2020   Procedure: CORONARY ARTERY BYPASS GRAFTING (CABG)x3. LEFT INTERNAL MAMMARY ARTERY. RIGHT ENDOSCOPIC SAPHENOUS VEIN HARVESTING.;  Surgeon: Linden Dolin, MD;  Location: [facility] OR;  Service: Open Heart Surgery;  Laterality: N/A;   ENDARTERECTOMY  06/07/2012   Procedure: ENDARTERECTOMY CAROTID;  Surgeon: Chuck Hint, MD;  Location: [facility] OR;  Service: Vascular;  Laterality: Right;  Right Carotid Endarterectomy with Patch Angioplasty   LEFT HEART CATH AND CORONARY ANGIOGRAPHY N/A 10/07/2020   Procedure: LEFT HEART CATH AND CORONARY ANGIOGRAPHY;  Surgeon: Lennette Bihari, MD;  Location: [facility] INVASIVE CV LAB;  Service: Cardiovascular;  Laterality: N/A;   TEE WITHOUT CARDIOVERSION N/A 10/18/2020   Procedure: TRANSESOPHAGEAL ECHOCARDIOGRAM (TEE);  Surgeon: Linden Dolin, MD;  Location[facility] OR;  Service: Open Heart Surgery;  Laterality: N/A;   TRANSPERINEAL IMPLANT OF RADIATION SEEDS W/ ULTRASOUND      MEDICATIONS:  amLODipine  (NORVASC) 10 MG tablet   apixaban (ELIQUIS) 5 MG TABS tablet   carvedilol (COREG) 25 MG tablet   EPINEPHrine (EPIPEN 2-PAK) 0.3 mg/0.3 mL IJ SOAJ injection   eszopiclone (LUNESTA) 1 MG TABS tablet   ezetimibe (ZETIA) 10 MG tablet   fluticasone (FLONASE) 50 MCG/ACT nasal spray   losartan (COZAAR) 25 MG tablet   metFORMIN (GLUCOPHAGE) 1000 MG tablet   pantoprazole (PROTONIX) 40 MG tablet   polyethylene glycol (MIRALAX / GLYCOLAX) 17 g packet   rosuvastatin (CRESTOR) 40 MG tablet   Tamsulosin HCl (FLOMAX) 0.4 MG CAPS   traZODone (DESYREL) 50 MG tablet   Trospium Chloride 60 MG CP24   No current facility-administered medications for this encounter.     Jodell Cipro Ward, PA-C [facility] [phone]

## 2023-01-08 NOTE — Telephone Encounter (Signed)
   Pre-operative Risk Assessment    Patient Name: Daniel Gibson  DOB: [birth year] MRN: [id number]      Request for Surgical Clearance    Procedure:   Ureteroscopy with biopsy Renal Mass  Date of Surgery:  Clearance 01-10-23                                 Surgeon:   Dr Sebastian Ache Surgeon's Group or Practice Name:   Phone number:  [phone] Fax number:  [phone]   Type of Clearance Requested:   - Medical    Type of Anesthesia:  General    Additional requests/questions:    SignedLaurence Ferrari   01/08/2023, 10:46 AM

## 2023-01-08 NOTE — Telephone Encounter (Signed)
  Patient Consent for Virtual Visi         Daniel Gibson has provided verbal consent on 01/08/2023 for a virtual visit (video or telephone).   CONSENT FOR VIRTUAL VISIT FOR:  Daniel Gibson  By participating in this virtual visit I agree to the following:  I hereby voluntarily request, consent and authorize [facility] and its employed or contracted physicians, physician assistants, nurse practitioners or other licensed health care professionals (the Practitioner), to provide me with telemedicine health care services (the "Services") as deemed necessary by the treating Practitioner. I acknowledge and consent to receive the Services by the Practitioner via telemedicine. I understand that the telemedicine visit will involve communicating with the Practitioner through live audiovisual communication technology and the disclosure of certain medical information by electronic transmission. I acknowledge that I have been given the opportunity to request an in-person assessment or other available alternative prior to the telemedicine visit and am voluntarily participating in the telemedicine visit.  I understand that I have the right to withhold or withdraw my consent to the use of telemedicine in the course of my care at any time, without affecting my right to future care or treatment, and that the Practitioner or I may terminate the telemedicine visit at any time. I understand that I have the right to inspect all information obtained and/or recorded in the course of the telemedicine visit and may receive copies of available information for a reasonable fee.  I understand that some of the potential risks of receiving the Services via telemedicine include:  Delay or interruption in medical evaluation due to technological equipment failure or disruption; Information transmitted may not be sufficient (e.g. poor resolution of images) to allow for appropriate medical decision making by the  Practitioner; and/or  In rare instances, security protocols could fail, causing a breach of personal health information.  Furthermore, I acknowledge that it is my responsibility to provide information about my medical history, conditions and care that is complete and accurate to the best of my ability. I acknowledge that Practitioner's advice, recommendations, and/or decision may be based on factors not within their control, such as incomplete or inaccurate data provided by me or distortions of diagnostic images or specimens that may result from electronic transmissions. I understand that the practice of medicine is not an exact science and that Practitioner makes no warranties or guarantees regarding treatment outcomes. I acknowledge that a copy of this consent can be made available to me via my patient portal [facility] MyChart), or I can request a printed copy by calling the office of [facility].    I understand that my insurance will be billed for this visit.   I have read or had this consent read to me. I understand the contents of this consent, which adequately explains the benefits and risks of the Services being provided via telemedicine.  I have been provided ample opportunity to ask questions regarding this consent and the Services and have had my questions answered to my satisfaction. I give my informed consent for the services to be provided through the use of telemedicine in my medical care

## 2023-01-09 ENCOUNTER — Ambulatory Visit: Payer: Medicare HMO | Attending: Cardiology

## 2023-01-09 DIAGNOSIS — Z01810 Encounter for preprocedural cardiovascular examination: Secondary | ICD-10-CM

## 2023-01-09 NOTE — Progress Notes (Signed)
Virtual Visit via Telephone Note   Because of Daniel Gibson's co-morbid illnesses, he is at least at moderate risk for complications without adequate follow up.  This format is felt to be most appropriate for this patient at this time.  The patient did not have access to video technology/had technical difficulties with video requiring transitioning to audio format only (telephone).  All issues noted in this document were discussed and addressed.  No physical exam could be performed with this format.  Please refer to the patient's chart for his consent to telehealth for [facility].  Evaluation Performed:  Preoperative cardiovascular risk assessment _____________   Date:  01/09/2023   Patient ID:  Daniel Gibson, DOB 05-Jul-1950, MRN [id number] Patient Location:  Home Provider location:   Office  Primary Care Provider:  Ailene Ravel, MD Primary Cardiologist:  None  Chief Complaint / Patient Profile   72 y.o. y/o male with a h/o coronary artery disease status post CABG, hyperlipidemia, hype tension, PVCs who is pending ureteroscopy with renal biopsy and presents today for telephonic preoperative cardiovascular risk assessment.  History of Present Illness    Daniel Gibson is a 71 y.o. male who presents via audio/video conferencing for a telehealth visit today.  Pt was last seen in cardiology clinic on 01/31/22 by Dr. Dulce Sellar.  At that time ISAIR INABINET was doing well .  The patient is now pending procedure as outlined above. Since his last visit, he remains stable from a cardiac standpoint.  Today he denies chest pain, shortness of breath, lower extremity edema, fatigue, palpitations, melena, hematuria, hemoptysis, diaphoresis, weakness, presyncope, syncope, orthopnea, and PND.   Past Medical History    Past Medical History:  Diagnosis Date   Carotid artery disease (HCC) 05/08/2012   CEA on left 2013   Coronary artery disease    Diabetes mellitus without  complication (HCC)    Food allergy    Alpha Gal allergy    GERD (gastroesophageal reflux disease)    Hyperlipidemia    Hypertensive heart disease without CHF 04/08/2018   Myocardial infarction [facility]) 1993   medically managed   Neuroendocrine carcinoma metastatic to liver (HCC)    Obesity (BMI 30-39.9) 04/08/2018   Prostate cancer (HCC) 08/07/2016   Pulmonary embolism (HCC)    PVC's (premature ventricular contractions) 04/08/2018   Thrombocytopenia (HCC)    Past Surgical History:  Procedure Laterality Date   APPENDECTOMY     CAROTID ENDARTERECTOMY Right 06-07-12   cea   CORONARY ARTERY BYPASS GRAFT N/A 10/18/2020   Procedure: CORONARY ARTERY BYPASS GRAFTING (CABG)x3. LEFT INTERNAL MAMMARY ARTERY. RIGHT ENDOSCOPIC SAPHENOUS VEIN HARVESTING.;  Surgeon: Linden Dolin, MD;  Location[facility];  Service: Open Heart Surgery;  Laterality: N/A;   ENDARTERECTOMY  06/07/2012   Procedure: ENDARTERECTOMY CAROTID;  Surgeon: Chuck Hint, MD;  Location: [facility] OR;  Service: Vascular;  Laterality: Right;  Right Carotid Endarterectomy with Patch Angioplasty   LEFT HEART CATH AND CORONARY ANGIOGRAPHY N/A 10/07/2020   Procedure: LEFT HEART CATH AND CORONARY ANGIOGRAPHY;  Surgeon: Lennette Bihari, MD;  [facility] LAB;  Service: Cardiovascular;  Laterality: N/A;   TEE WITHOUT CARDIOVERSION N/A 10/18/2020   Procedure: TRANSESOPHAGEAL ECHOCARDIOGRAM (TEE);  Surgeon: Linden Dolin, MD;  Location[facility] OR;  Service: Open Heart Surgery;  Laterality: N/A;   TRANSPERINEAL IMPLANT OF RADIATION SEEDS W/ ULTRASOUND      Allergies  Allergies  Allergen Reactions   Ace Inhibitors Swelling  Albumin (Human)     Potential interaction with Alpha-gal allergy   Beef-Derived Products Anaphylaxis    Alpha-Gal allergy   Gelfoam-Jmi Sponge     Alpha-gal allergy.   Heparin     Potential interaction with alpha-gal allergy   Pork-Derived Products Anaphylaxis    Alpha-Gal allergy   Sheep-Derived  Products Anaphylaxis    Alpha-Gal allergy   Tetanus Toxoids Swelling    Home Medications    Prior to Admission medications   Medication Sig Start Date End Date Taking? Authorizing Provider  amLODipine (NORVASC) 10 MG tablet Take 10 mg by mouth in the morning.    [provider]  apixaban (ELIQUIS) 5 MG TABS tablet Take 1 tablet (5 mg total) by mouth 2 (two) times daily. 10/11/22   Jaci Standard, MD  carvedilol (COREG) 25 MG tablet Take 12.5 mg by mouth 2 (two) times daily with a meal.    [provider]  EPINEPHrine (EPIPEN 2-PAK) 0.3 mg/0.3 mL IJ SOAJ injection Use as directed for life-threatening allergic reaction. 05/10/20   Kozlow, Alvira Philips, MD  eszopiclone (LUNESTA) 1 MG TABS tablet Take 1 tablet (1 mg total) by mouth at bedtime as needed for sleep. Take immediately before bedtime Patient not taking: Reported on 01/08/2023 12/11/22   Jaci Standard, MD  ezetimibe (ZETIA) 10 MG tablet Take 1 tablet (10 mg total) by mouth daily. Needs appointment for future refills 11/09/22   Baldo Daub, MD  fluticasone [facility]) 50 MCG/ACT nasal spray Place 2 sprays into both nostrils daily. 09/19/22   [provider]  losartan (COZAAR) 25 MG tablet Take 25 mg by mouth daily. 11/17/22   [provider]  metFORMIN (GLUCOPHAGE) 1000 MG tablet Take 1,000 mg by mouth 2 (two) times daily with a meal.    [provider]  pantoprazole (PROTONIX) 40 MG tablet Take 1 tablet (40 mg total) by mouth daily. Needs appointment for future refills 11/09/22   Baldo Daub, MD  polyethylene glycol (MIRALAX / GLYCOLAX) 17 g packet Take 17 g by mouth daily as needed (constipation).    [provider]  rosuvastatin (CRESTOR) 40 MG tablet Take 1 tablet (40 mg total) by mouth daily. Needs appointment for future refills 12/08/22   Baldo Daub, MD  Tamsulosin HCl (FLOMAX) 0.4 MG CAPS Take 0.8 mg by mouth at bedtime.    [provider]  traZODone (DESYREL) 50 MG  tablet Take 50 mg by mouth at bedtime as needed for sleep.    [provider]  Trospium Chloride 60 MG CP24 Take 1 capsule by mouth daily. 11/25/22   [provider]    Physical Exam    Vital Signs:  Geanie Cooley does not have vital signs available for review today.  Given telephonic nature of communication, physical exam is limited. AAOx3. NAD. Normal affect.  Speech and respirations are unlabored.  Accessory Clinical Findings    None  Assessment & Plan    1.  Preoperative Cardiovascular Risk Assessment: Ureteroscopy with renal biopsy, Dr. Berneice Heinrich, 01/10/23, fax-[phone]      Primary Cardiologist: None  Chart reviewed as part of pre-operative protocol coverage. Given past medical history and time since last visit, based on ACC/AHA guidelines, KEIJI MELLAND would be at acceptable risk for the planned procedure without further cardiovascular testing.   His RCRI is a class II risk, 0.9% risk of major cardiac event.  He is able to complete greater than 4 METS of physical  activity.  Patient was advised that if he develops new symptoms prior to surgery to contact our office to arrange a follow-up appointment.  He verbalized understanding.       Time:   Today, I have spent 5  minutes with the patient with telehealth technology discussing medical history, symptoms, and management plan.  Prior to his phone evaluation I spent greater than 10 minutes reviewing his past medical history and cardiac medications.   Ronney Asters, NP  01/09/2023, 8:24 AM

## 2023-01-09 NOTE — Anesthesia Preprocedure Evaluation (Signed)
Anesthesia Evaluation  Patient identified by MRN, date of birth, ID band Patient awake    Reviewed: Allergy & Precautions, NPO status , Patient's Chart, lab work & pertinent test results  Airway Mallampati: II  TM Distance: >3 FB Neck ROM: Full    Dental no notable dental hx. (+) Teeth Intact, Dental Advisory Given   Pulmonary neg pulmonary ROS   Pulmonary exam normal breath sounds clear to auscultation       Cardiovascular hypertension, Pt. on medications and Pt. on home beta blockers + CAD, + Past MI and + CABG  Normal cardiovascular exam Rhythm:Regular Rate:Normal     Neuro/Psych negative neurological ROS  negative psych ROS   GI/Hepatic ,GERD  Controlled and Medicated,,  Endo/Other  diabetes, Type 2, Oral Hypoglycemic Agents    Renal/GU    Prostate CA    Musculoskeletal negative musculoskeletal ROS (+)    Abdominal   Peds  Hematology Lab Results      Component                Value               Date                      WBC                      8.0                 01/05/2023                HGB                      13.7                01/05/2023                HCT                      39.9                01/05/2023                MCV                      89.5                01/05/2023                PLT                      144 (L)             01/05/2023              Anesthesia Other Findings Pt w alpha Gal  All: Ace inhibitors  Reproductive/Obstetrics                             Anesthesia Physical Anesthesia Plan  ASA: 3  Anesthesia Plan: General   Post-op Pain Management: Ofirmev IV (intra-op)* and Precedex   Induction: Intravenous  PONV Risk Score and Plan: Treatment may vary due to age or medical condition, Midazolam, Ondansetron and Dexamethasone  Airway Management Planned: LMA  Additional Equipment: None  Intra-op Plan:   Post-operative Plan: Extubation in  OR  Informed Consent: I have reviewed the patients  History and Physical, chart, labs and discussed the procedure including the risks, benefits and alternatives for the proposed anesthesia with the patient or authorized representative who has indicated his/her understanding and acceptance.     Dental advisory given  Plan Discussed with: CRNA  Anesthesia Plan Comments: (See PAT note 01/05/2023)       Anesthesia Quick Evaluation

## 2023-01-10 ENCOUNTER — Ambulatory Visit (HOSPITAL_COMMUNITY): Payer: Medicare HMO

## 2023-01-10 ENCOUNTER — Other Ambulatory Visit: Payer: Self-pay | Admitting: Urology

## 2023-01-10 ENCOUNTER — Ambulatory Visit (HOSPITAL_BASED_OUTPATIENT_CLINIC_OR_DEPARTMENT_OTHER): Payer: Medicare HMO | Admitting: Anesthesiology

## 2023-01-10 ENCOUNTER — Ambulatory Visit (HOSPITAL_COMMUNITY): Payer: Medicare HMO | Admitting: Physician Assistant

## 2023-01-10 ENCOUNTER — Other Ambulatory Visit: Payer: Self-pay

## 2023-01-10 ENCOUNTER — Encounter (HOSPITAL_COMMUNITY): Payer: Self-pay | Admitting: Urology

## 2023-01-10 ENCOUNTER — Encounter (HOSPITAL_COMMUNITY)
Admission: RE | Disposition: A | Discharge status: Home / Self Care | Payer: Self-pay | Source: Home / Self Care | Attending: Urology

## 2023-01-10 ENCOUNTER — Ambulatory Visit (HOSPITAL_COMMUNITY)
Admission: RE | Admit: 2023-01-10 | Discharge: 2023-01-10 | Disposition: A | Discharge status: Home / Self Care | Payer: Medicare HMO | Attending: Urology | Admitting: Urology

## 2023-01-10 DIAGNOSIS — E785 Hyperlipidemia, unspecified: Secondary | ICD-10-CM | POA: Diagnosis not present

## 2023-01-10 DIAGNOSIS — I2510 Atherosclerotic heart disease of native coronary artery without angina pectoris: Secondary | ICD-10-CM | POA: Diagnosis not present

## 2023-01-10 DIAGNOSIS — Z08 Encounter for follow-up examination after completed treatment for malignant neoplasm: Secondary | ICD-10-CM | POA: Insufficient documentation

## 2023-01-10 DIAGNOSIS — Z951 Presence of aortocoronary bypass graft: Secondary | ICD-10-CM | POA: Diagnosis not present

## 2023-01-10 DIAGNOSIS — I10 Essential (primary) hypertension: Secondary | ICD-10-CM | POA: Insufficient documentation

## 2023-01-10 DIAGNOSIS — Z8546 Personal history of malignant neoplasm of prostate: Secondary | ICD-10-CM | POA: Diagnosis not present

## 2023-01-10 DIAGNOSIS — I252 Old myocardial infarction: Secondary | ICD-10-CM

## 2023-01-10 DIAGNOSIS — C7A8 Other malignant neuroendocrine tumors: Secondary | ICD-10-CM

## 2023-01-10 DIAGNOSIS — E119 Type 2 diabetes mellitus without complications: Secondary | ICD-10-CM

## 2023-01-10 DIAGNOSIS — N2889 Other specified disorders of kidney and ureter: Secondary | ICD-10-CM | POA: Insufficient documentation

## 2023-01-10 DIAGNOSIS — K769 Liver disease, unspecified: Secondary | ICD-10-CM

## 2023-01-10 DIAGNOSIS — Z923 Personal history of irradiation: Secondary | ICD-10-CM | POA: Insufficient documentation

## 2023-01-10 DIAGNOSIS — K219 Gastro-esophageal reflux disease without esophagitis: Secondary | ICD-10-CM | POA: Diagnosis not present

## 2023-01-10 DIAGNOSIS — Z7984 Long term (current) use of oral hypoglycemic drugs: Secondary | ICD-10-CM | POA: Insufficient documentation

## 2023-01-10 DIAGNOSIS — D49511 Neoplasm of unspecified behavior of right kidney: Secondary | ICD-10-CM | POA: Diagnosis not present

## 2023-01-10 HISTORY — PX: CYSTOSCOPY WITH RETROGRADE PYELOGRAM, URETEROSCOPY AND STENT PLACEMENT: SHX5789

## 2023-01-10 LAB — GLUCOSE, CAPILLARY
Glucose-Capillary: 208 mg/dL — ABNORMAL HIGH (ref 70–99)
Glucose-Capillary: 175 mg/dL — ABNORMAL HIGH (ref 70–99)

## 2023-01-10 SURGERY — CYSTOURETEROSCOPY, WITH RETROGRADE PYELOGRAM AND STENT INSERTION
Anesthesia: General | Laterality: Right

## 2023-01-10 MED ORDER — SODIUM CHLORIDE 0.9 % IR SOLN
Status: DC | PRN
  Administered 2023-01-10: 1000 mL via INTRAVESICAL
  Administered 2023-01-10: 3000 mL via INTRAVESICAL

## 2023-01-10 MED ORDER — OXYCODONE-ACETAMINOPHEN 5-325 MG PO TABS: 1.0000 | ORAL_TABLET | Freq: Four times a day (QID) | ORAL | 0 refills | Status: AC | PRN

## 2023-01-10 MED ORDER — INSULIN ASPART 100 UNIT/ML IJ SOLN
INTRAMUSCULAR | Status: AC
  Filled 2023-01-10: qty 1

## 2023-01-10 MED ORDER — INSULIN ASPART 100 UNIT/ML IJ SOLN
0.0000 [IU] | INTRAMUSCULAR | Status: DC | PRN
  Administered 2023-01-10: 2 [IU] via SUBCUTANEOUS

## 2023-01-10 MED ORDER — LIDOCAINE HCL (PF) 2 % IJ SOLN
INTRAMUSCULAR | Status: AC
  Filled 2023-01-10: qty 5

## 2023-01-10 MED ORDER — IOHEXOL 300 MG/ML  SOLN
INTRAMUSCULAR | Status: DC | PRN
  Administered 2023-01-10: 10 mL

## 2023-01-10 MED ORDER — PROPOFOL 10 MG/ML IV BOLUS
INTRAVENOUS | Status: DC | PRN
Start: 2023-01-10 — End: 2023-01-10
  Administered 2023-01-10: 130 mg via INTRAVENOUS

## 2023-01-10 MED ORDER — GENTAMICIN SULFATE 40 MG/ML IJ SOLN
5.0000 mg/kg | INTRAVENOUS | Status: AC
  Administered 2023-01-10: 390 mg via INTRAVENOUS
  Filled 2023-01-10: qty 9.75

## 2023-01-10 MED ORDER — ACETAMINOPHEN 10 MG/ML IV SOLN: 1000.0000 mg | Freq: Once | INTRAVENOUS | Status: DC | PRN

## 2023-01-10 MED ORDER — LACTATED RINGERS IV SOLN: INTRAVENOUS | Status: DC

## 2023-01-10 MED ORDER — HYDROMORPHONE HCL 1 MG/ML IJ SOLN: 0.2500 mg | INTRAMUSCULAR | Status: DC | PRN

## 2023-01-10 MED ORDER — MIDAZOLAM HCL 2 MG/2ML IJ SOLN
INTRAMUSCULAR | Status: DC | PRN
  Administered 2023-01-10: 2 mg via INTRAVENOUS

## 2023-01-10 MED ORDER — CHLORHEXIDINE GLUCONATE 0.12 % MT SOLN
15.0000 mL | Freq: Once | OROMUCOSAL | Status: AC
  Administered 2023-01-10: 15 mL via OROMUCOSAL

## 2023-01-10 MED ORDER — EPHEDRINE 5 MG/ML INJ
INTRAVENOUS | Status: AC
  Filled 2023-01-10: qty 5

## 2023-01-10 MED ORDER — LIDOCAINE 2% (20 MG/ML) 5 ML SYRINGE
INTRAMUSCULAR | Status: DC | PRN
  Administered 2023-01-10: 80 mg via INTRAVENOUS

## 2023-01-10 MED ORDER — EPHEDRINE SULFATE-NACL 50-0.9 MG/10ML-% IV SOSY
PREFILLED_SYRINGE | INTRAVENOUS | Status: DC | PRN
  Administered 2023-01-10: 10 mg via INTRAVENOUS

## 2023-01-10 MED ORDER — DEXAMETHASONE SODIUM PHOSPHATE 10 MG/ML IJ SOLN
INTRAMUSCULAR | Status: AC
  Filled 2023-01-10: qty 1

## 2023-01-10 MED ORDER — ONDANSETRON HCL 4 MG/2ML IJ SOLN
INTRAMUSCULAR | Status: AC
  Filled 2023-01-10: qty 2

## 2023-01-10 MED ORDER — ONDANSETRON HCL 4 MG/2ML IJ SOLN: 4.0000 mg | Freq: Once | INTRAMUSCULAR | Status: DC | PRN

## 2023-01-10 MED ORDER — OXYCODONE HCL 5 MG PO TABS: 5.0000 mg | ORAL_TABLET | Freq: Once | ORAL | Status: DC | PRN

## 2023-01-10 MED ORDER — DEXAMETHASONE SODIUM PHOSPHATE 4 MG/ML IJ SOLN
INTRAMUSCULAR | Status: DC | PRN
  Administered 2023-01-10: 4 mg via INTRAVENOUS

## 2023-01-10 MED ORDER — PROPOFOL 10 MG/ML IV BOLUS
INTRAVENOUS | Status: AC
  Filled 2023-01-10: qty 20

## 2023-01-10 MED ORDER — ORAL CARE MOUTH RINSE: 15.0000 mL | Freq: Once | OROMUCOSAL | Status: AC

## 2023-01-10 MED ORDER — OXYCODONE HCL 5 MG/5ML PO SOLN: 5.0000 mg | Freq: Once | ORAL | Status: DC | PRN

## 2023-01-10 MED ORDER — AMISULPRIDE (ANTIEMETIC) 5 MG/2ML IV SOLN: 10.0000 mg | Freq: Once | INTRAVENOUS | Status: DC | PRN

## 2023-01-10 MED ORDER — MIDAZOLAM HCL 2 MG/2ML IJ SOLN
INTRAMUSCULAR | Status: AC
  Filled 2023-01-10: qty 2

## 2023-01-10 MED ORDER — FENTANYL CITRATE (PF) 100 MCG/2ML IJ SOLN
INTRAMUSCULAR | Status: AC
  Filled 2023-01-10: qty 2

## 2023-01-10 MED ORDER — SENNOSIDES-DOCUSATE SODIUM 8.6-50 MG PO TABS: 1.0000 | ORAL_TABLET | Freq: Two times a day (BID) | ORAL | 0 refills | Status: DC

## 2023-01-10 MED ORDER — ONDANSETRON HCL 4 MG/2ML IJ SOLN
INTRAMUSCULAR | Status: DC | PRN
  Administered 2023-01-10: 4 mg via INTRAVENOUS

## 2023-01-10 SURGICAL SUPPLY — 22 items
BAG URO CATCHER STRL LF (MISCELLANEOUS) ×1 IMPLANT
BASKET LASER NITINOL 1.9FR (BASKET) IMPLANT
BSKT STON RTRVL 120 1.9FR (BASKET)
CATH URETL OPEN END 6FR 70 (CATHETERS) ×1 IMPLANT
CLOTH BEACON ORANGE TIMEOUT ST (SAFETY) ×1 IMPLANT
EXTRACTOR STONE 1.7FRX115CM (UROLOGICAL SUPPLIES) IMPLANT
GLOVE SURG LX STRL 7.5 STRW (GLOVE) ×1 IMPLANT
GOWN STRL REUS W/ TWL XL LVL3 (GOWN DISPOSABLE) ×1 IMPLANT
GOWN STRL REUS W/TWL XL LVL3 (GOWN DISPOSABLE) ×1
GUIDEWIRE ANG ZIPWIRE 038X150 (WIRE) ×1 IMPLANT
GUIDEWIRE STR DUAL SENSOR (WIRE) ×1 IMPLANT
KIT TURNOVER KIT A (KITS) IMPLANT
LASER FIB FLEXIVA PULSE ID 365 (Laser) IMPLANT
MANIFOLD NEPTUNE II (INSTRUMENTS) ×1 IMPLANT
PACK CYSTO (CUSTOM PROCEDURE TRAY) ×1 IMPLANT
SHEATH NAVIGATOR HD 11/13X28 (SHEATH) IMPLANT
SHEATH NAVIGATOR HD 11/13X36 (SHEATH) IMPLANT
TRACTIP FLEXIVA PULS ID 200XHI (Laser) IMPLANT
TRACTIP FLEXIVA PULSE ID 200 (Laser)
TUBE PU 8FR 16IN ENFIT (TUBING) ×1 IMPLANT
TUBING CONNECTING 10 (TUBING) ×1 IMPLANT
TUBING UROLOGY SET (TUBING) ×1 IMPLANT

## 2023-01-10 NOTE — Op Note (Signed)
NAME: Daniel Gibson, Daniel Gibson MEDICAL RECORD NO: [id number] ACCOUNT NO: [id number] DATE OF BIRTH: Nov 27, 1950 FACILITY: [facility] LOCATION: [facility] PHYSICIAN: Sebastian Ache, MD  Operative Report   DATE OF PROCEDURE: 01/10/2023  PREOPERATIVE DIAGNOSIS:  Right renal mass.  PROCEDURE:   1.  Cystoscopy with right retrograde pyelogram interpretation 2.  Right diagnostic ureteroscopy.  ESTIMATED BLOOD LOSS:  Nil.  COMPLICATIONS:  None.  SPECIMEN:  None.  FINDINGS:   1.  Mildly narrow urethra.  This did easily accommodate a 21-French cystoscope. 2.  Some tortuosity of the right ureter. 3.  No evidence of intraluminal invasion of right renal mass, ureteroscopic biopsy therefore not possible. 4.  Brachytherapy seed in expected location in the prostate.  INDICATIONS:  The patient is a 72 year old man with significant oncologic comorbidity.  He has a history of prostate cancer, status post primary radiation.  He has met criteria for cure for this.  He also has a right mostly endophytic renal mass and  history of metastatic carcinoid tumor.  It has been assumed that his right renal mass is another focus of carcinoid implant; however, while other areas of his known carcinoid tumor had responded well to medical therapy this renal mass remained stable,  which raises a suspicion of possible second primary malignancy. Given his relatively endophytic nature I recommend diagnostic ureteroscopy, possible biopsy with a goal of ruling out urothelial origin and possible obtaining tissue from this and he  presents for this today.  Informed consent was obtained and placed in medical record.  PROCEDURE DETAILS:  The patient being identified and verified, procedure being cystoscopy, right ureteroscopy, possible biopsy was confirmed.  Procedure timeout was performed.  Intravenous antibiotics were administered.  General anesthesia was induced.   The patient was placed into a low lithotomy position.  Sterile field was  created, prepped and draped the patient's penis, perineum, and proximal thigh using iodine.  Cystourethroscopy was performed using 21-French rigid cystoscope with offset lens.   Inspection of anterior and posterior urethra did reveal some fixation of the prostate and relative narrowing of the bulbous urethra; however, no frank stricturing.  This did easily accommodate a 21-French scope without formal dilation.  Inspection of  urinary bladder revealed very mild trabeculation, some erythematous changes consistent with mild radiation cystitis, but no active bleeding.  No papillary lesions noted.  Ureteral orifices appeared single.  The right ureteral orifice was cannulated with  6-French end-hole catheter, and right retrograde pyelogram was obtained.  Right retrograde pyelogram demonstrate single right ureter with single system right kidney.  There was some tortuosity of the ureter.  There was some overt obvious mass effect on the collecting system that appeared to be mostly a pushing type orientation  not an invading type orientation. Brachytherapy seeds were noted in the prostate as well.  Next, semirigid ureteroscopy performed of the distal orifice of the right ureter alongside a separate sensor working wire.  No mucosal abnormalities were found.   Semirigid scope was then exchanged for a medium length ureteral access sheath to the level of proximal ureter using continuous fluoroscopic guidance and flexible digital ureteroscopy was performed of the proximal right ureter and systematic inspection of  right kidney including all calices x3.  There was significant mass effect within the intrarenal collecting system with a very elongated upper pole infundibula and mid pole infundibula; however, there was no obvious invading of the intraluminal aspect of  the kidney again this appeared to be more of a pushing orientation.  This therefore completely ruled  out any urothelial origin to the neoplasm and does keep  with higher suspicion of more cortical etiology versus possible metastatic focus of his  carcinoid. As the mass was not intraluminal ureteroscopic biopsy was therefore not technically possible and access sheath was removed under continuous vision, no significant mucosal abnormalities were found.  Given the relatively atraumatic nature of the  procedure today, it was not felt that interval stent to be warranted.  Procedure was terminated.  The patient tolerated procedure well, no immediate perioperative complications.  The patient taken to the postanesthesia care unit in stable condition.  Plan for discharge home.   PUS D: 01/10/2023 3:33:09 pm T: 01/10/2023 5:50:00 pm  JOB: [id number]/ [id number]

## 2023-01-10 NOTE — Discharge Instructions (Addendum)
1 - You may have urinary urgency (bladder spasms) and bloody urine on / off x few days. This is normal.  2 - Office will reach out to arrange radiology-guided percutaneous biopsy of renal mass.  3 - Call MD or go to ER for fever >102, severe pain / nausea / vomiting not relieved by medications, or acute change in medical status

## 2023-01-10 NOTE — Brief Op Note (Signed)
01/10/2023  3:27 PM  PATIENT:  Daniel Gibson  72 y.o. male  PRE-OPERATIVE DIAGNOSIS:  RIGHT RENAL MASS  POST-OPERATIVE DIAGNOSIS:  RIGHT RENAL MASS  PROCEDURE:  Procedure(s): CYSTOSCOPY WITH RETROGRADE PYELOGRAM, RIGHT DIAGNOSTIC URETEROSCOPY (Right)  SURGEON:  Surgeons and Role:    * Raenell Mensing, Delbert Phenix., MD - Primary  PHYSICIAN ASSISTANT:   ASSISTANTS: none   ANESTHESIA:   general  EBL:  minimal   BLOOD ADMINISTERED:none  DRAINS: none   LOCAL MEDICATIONS USED:  NONE  SPECIMEN:  No Specimen  DISPOSITION OF SPECIMEN:  N/A  COUNTS:  YES  TOURNIQUET:  * No tourniquets in log *  DICTATION: .Other Dictation: Dictation Number [id number]  PLAN OF CARE: Discharge to home after PACU  PATIENT DISPOSITION:  PACU - hemodynamically stable.   Delay start of Pharmacological VTE agent (>24hrs) due to surgical blood loss or risk of bleeding: no

## 2023-01-10 NOTE — Anesthesia Procedure Notes (Signed)
Procedure Name: LMA Insertion Date/Time: 01/10/2023 3:03 PM  Performed by: Nelle Don, CRNAPre-anesthesia Checklist: Patient identified, Emergency Drugs available, Suction available and Patient being monitored Patient Re-evaluated:Patient Re-evaluated prior to induction Oxygen Delivery Method: Circle system utilized Preoxygenation: Pre-oxygenation with 100% oxygen Induction Type: IV induction LMA: LMA inserted LMA Size: 4.0 Number of attempts: 1 Dental Injury: Teeth and Oropharynx as per pre-operative assessment

## 2023-01-10 NOTE — Transfer of Care (Signed)
Immediate Anesthesia Transfer of Care Note  Patient: Daniel Gibson  Procedure(s) Performed: CYSTOSCOPY WITH RETROGRADE PYELOGRAM, RIGHT DIAGNOSTIC URETEROSCOPY (Right)  Patient Location: PACU  Anesthesia Type:General  Level of Consciousness: drowsy  Airway & Oxygen Therapy: Patient Spontanous Breathing and Patient connected to face mask oxygen  Post-op Assessment: Report given to RN, Post -op Vital signs reviewed and stable, and Patient moving all extremities X 4  Post vital signs: Reviewed and stable  Last Vitals:  Vitals Value Taken Time  BP 118/60   Temp    Pulse 57 01/10/23 1536  Resp 8   SpO2 100 % 01/10/23 1536  Vitals shown include unfiled device data.  Last Pain:  Vitals:   01/10/23 1355  TempSrc:   PainSc: 0-No pain         Complications: No notable events documented.

## 2023-01-10 NOTE — H&P (Signed)
Daniel Gibson is an 72 y.o. male.    Chief Complaint: Pre-OP RIGHT ureteroscopy / biopsy  HPI:   1 - Low Risk Prostate Cancer - s/p primary brachytherapy with short term androgen deprivation (downsizing) by Daniel Gibson for multifocal Grade 1 disease, Pre-treatment PSA 4.4, TRUS 55mL. PSA <.01 2022 (met all criteria for cure).   2 - Lower Urinary Tract Symptoms / Bulbar Stricture - on tamsulosin + trospium ER x years after brachytherapy. He had urethroscopy 2015 by Daniel Gibson as part of hematuria eval that showed bulbar stricture that was not treated. Had 48F catheter placed by Daniel Gibson 2022 at time of CABG, did not require dilation.   3 - Right Endophytic Renal Mass / Metastatic CArcinoid - 4.7cm solid, enhanicing Rt renal mass by CT in [place] 12/2021. Some liver and peritoneal lesaions as well. IR BX 12/2021 (of peritoneal nodule, NOT renal mass) with metastatic carcinoid, now on systemic therapy managed by Dr. Leonides Gibson with med-onc.   Recent Course:  12/2022 - CT completely stable Rt renal mass (4.7cm max diameter by measure,same as 1 year prior)   PMH sig for CAD/CABG (follows cards Daniel Gibson cards in [place]), CEA (follows vas curgery), DM2 (A1c 6-7), HTN, PE/Eliquus (resolved by serial imaging). His PCP is Daniel Blanks MD.   Today " Daniel Gibson " is seen to proceed with cysto, Rt ureteroscopy and attempt BX renal mass to verify histology. No interval fevers. He has cards clearance on file and is felt moderate risk.    Past Medical History:  Diagnosis Date   Carotid artery disease (HCC) 05/08/2012   CEA on left 2013   Coronary artery disease    Diabetes mellitus without complication (HCC)    Food allergy    Alpha Gal allergy    GERD (gastroesophageal reflux disease)    Hyperlipidemia    Hypertensive heart disease without CHF 04/08/2018   Myocardial infarction [facility]) 1993   medically managed   Neuroendocrine carcinoma metastatic to liver (HCC)    Obesity (BMI 30-39.9) 04/08/2018   Prostate  cancer (HCC) 08/07/2016   Pulmonary embolism (HCC)    PVC's (premature ventricular contractions) 04/08/2018   Thrombocytopenia (HCC)     Past Surgical History:  Procedure Laterality Date   APPENDECTOMY     CAROTID ENDARTERECTOMY Right 06-07-12   cea   CORONARY ARTERY BYPASS GRAFT N/A 10/18/2020   Procedure: CORONARY ARTERY BYPASS GRAFTING (CABG)x3. LEFT INTERNAL MAMMARY ARTERY. RIGHT ENDOSCOPIC SAPHENOUS VEIN HARVESTING.;  Surgeon: Daniel Dolin, MD;  Location: [facility] OR;  Service: Open Heart Surgery;  Laterality: N/A;   ENDARTERECTOMY  06/07/2012   Procedure: ENDARTERECTOMY CAROTID;  Surgeon: Daniel Hint, MD;  Location: [facility] OR;  Service: Vascular;  Laterality: Right;  Right Carotid Endarterectomy with Patch Angioplasty   LEFT HEART CATH AND CORONARY ANGIOGRAPHY N/A 10/07/2020   Procedure: LEFT HEART CATH AND CORONARY ANGIOGRAPHY;  Surgeon: Daniel Bihari, MD;  Location: [facility] INVASIVE CV LAB;  Service: Cardiovascular;  Laterality: N/A;   TEE WITHOUT CARDIOVERSION N/A 10/18/2020   Procedure: TRANSESOPHAGEAL ECHOCARDIOGRAM (TEE);  Surgeon: Daniel Dolin, MD;  Location: [facility] OR;  Service: Open Heart Surgery;  Laterality: N/A;   TRANSPERINEAL IMPLANT OF RADIATION SEEDS W/ ULTRASOUND      Family History  Problem Relation Age of Onset   Cancer Mother    Hyperlipidemia Mother    Hypertension Mother    Heart attack Father    Heart disease Father        Heart Disease before age 8  Hyperlipidemia Father    Hypertension Father    Diabetes Sister    Heart disease Sister        before age 103   Hypertension Sister    Hyperlipidemia Sister    Diabetes Brother    Heart disease Brother        before age 14   Hyperlipidemia Brother    Hypertension Brother    Social History:  reports that he has never smoked. He has never used smokeless tobacco. He reports that he does not drink alcohol and does not use drugs.  Allergies:  Allergies  Allergen Reactions   Ace Inhibitors Swelling    Albumin (Human)     Potential interaction with Alpha-gal allergy   Beef-Derived Products Anaphylaxis    Alpha-Gal allergy   Gelfoam-Jmi Sponge     Alpha-gal allergy.   Heparin     Potential interaction with alpha-gal allergy   Pork-Derived Products Anaphylaxis    Alpha-Gal allergy   Sheep-Derived Products Anaphylaxis    Alpha-Gal allergy   Tetanus Toxoids Swelling    No medications prior to admission.    No results found for this or any previous visit (from the past 48 hour(s)). No results found.  Review of Systems  Constitutional:  Negative for chills and fever.  All other systems reviewed and are negative.   There were no vitals taken for this visit. Physical Exam Vitals reviewed.  HENT:     Head: Normocephalic.  Eyes:     Pupils: Pupils are equal, round, and reactive to light.  Cardiovascular:     Rate and Rhythm: Normal rate.  Pulmonary:     Effort: Pulmonary effort is normal.  Abdominal:     General: Abdomen is flat.  Genitourinary:    Comments: No CVAT at present Musculoskeletal:        General: Normal range of motion.     Cervical back: Normal range of motion.  Neurological:     Mental Status: He is alert.  Psychiatric:        Mood and Affect: Mood normal.      Assessment/Plan  Proceed as planned with cysto, Rt retrograde / ureteroscopy / attempt BX renal mass. Goal is to verify if renal lesiosn is another focus of carcinoid v. Additional primary. Risks, benefits, alternatives, expected peri-op course discussed previously and reiterated today.   Loletta Parish., MD 01/10/2023, 6:47 AM

## 2023-01-10 NOTE — Anesthesia Postprocedure Evaluation (Signed)
Anesthesia Post Note  Patient: Daniel Gibson  Procedure(s) Performed: CYSTOSCOPY WITH RETROGRADE PYELOGRAM, RIGHT DIAGNOSTIC URETEROSCOPY (Right)     Patient location during evaluation: PACU Anesthesia Type: General Level of consciousness: awake and alert Pain management: pain level controlled Vital Signs Assessment: post-procedure vital signs reviewed and stable Respiratory status: spontaneous breathing, nonlabored ventilation, respiratory function stable and patient connected to nasal cannula oxygen Cardiovascular status: blood pressure returned to baseline and stable Postop Assessment: no apparent nausea or vomiting Anesthetic complications: no   No notable events documented.  Last Vitals:  Vitals:   01/10/23 1630 01/10/23 1636  BP: (!) 143/73 (!) 152/80  Pulse: (!) 57 (!) 59  Resp: 15   Temp: 36.4 C   SpO2: 96% 98%    Last Pain:  Vitals:   01/10/23 1630  TempSrc:   PainSc: 0-No pain                 Trevor Iha

## 2023-01-11 ENCOUNTER — Other Ambulatory Visit (HOSPITAL_COMMUNITY): Payer: Self-pay | Admitting: Urology

## 2023-01-11 ENCOUNTER — Encounter (HOSPITAL_COMMUNITY): Payer: Self-pay | Admitting: Urology

## 2023-01-11 DIAGNOSIS — D49511 Neoplasm of unspecified behavior of right kidney: Secondary | ICD-10-CM

## 2023-01-13 ENCOUNTER — Encounter: Payer: Self-pay | Admitting: Hematology and Oncology

## 2023-01-15 NOTE — Progress Notes (Signed)
Daniel Big, MD  Leodis Rains D; P Ir Procedure Requests Approved for CT guided bx of endophytic RIGHT upper pole renal mass.  Pt has known mesenteric carcinoid and mets - is this met or primary RCC?  Mass is endophytic, pt may need IV contrast at time of biopsy.  HKM

## 2023-01-16 DIAGNOSIS — E782 Mixed hyperlipidemia: Secondary | ICD-10-CM | POA: Diagnosis not present

## 2023-01-16 DIAGNOSIS — E1129 Type 2 diabetes mellitus with other diabetic kidney complication: Secondary | ICD-10-CM | POA: Diagnosis not present

## 2023-01-16 DIAGNOSIS — Z79899 Other long term (current) drug therapy: Secondary | ICD-10-CM | POA: Diagnosis not present

## 2023-01-25 ENCOUNTER — Other Ambulatory Visit (HOSPITAL_COMMUNITY): Payer: Self-pay | Admitting: Student

## 2023-01-25 DIAGNOSIS — C7A8 Other malignant neuroendocrine tumors: Secondary | ICD-10-CM

## 2023-01-25 NOTE — H&P (Signed)
Chief Complaint: Patient was seen in consultation today for renal mass biopsy  Referring Physician(s): Manny,Theodore B Jr.  Supervising Physician: Malachy Moan  Patient Status: [facility] - Out-pt  History of Present Illness: Daniel Gibson is a 72 y.o. male with a medical history significant for CAD, DM, HTN, MI, PE, prostate cancer s/p radiation and metastatic neuroendocrine carcinoma diagnosed in 2023. Patient is familiar to IR from a peritoneal mass biopsy 01/18/22 with pathology positive for low-grade neuroendocrine neoplasm. A right renal mass has been present since diagnosis and while other areas of metastases have responded well to treatment the right renal mass has remained relatively stable. The patient was taken to the OR 01/10/23 for an attempted right diagnostic ureteroscopy but there was no evidence of intraluminal invasion and a biopsy was not possible.   CT chest/abdomen/pelvis 11/24/22 IMPRESSION: 1. Right lower quadrant mesenteric soft tissue mass with dystrophic calcifications consistent with known history of neuroendocrine tumor. This lesion appears similar. However there is increasing overall areas of peritoneal carcinomatosis particularly in the right upper quadrant. Associated wall thickening along loops of bowel in the right lower quadrant with retraction. 2. Increasing right upper renal metastasis. 3. Subtle new lesions along the spleen are indeterminate but worrisome for metastatic disease. Recommend either short follow-up or additional workup with MRI as clinically appropriate. 4. Previous liver lesion appears slightly smaller today. 5. Stable right pleural nodule along the right hemidiaphragm. 6. No obstruction, free air. 7. No developing thoracic lymph node enlargement or mass lesion at this time.  Interventional Radiology has been asked to evaluate this patient for an image-guided renal mass biopsy for further work up. Imaging reviewed and procedure  approved by Dr. Archer Asa  Past Medical History:  Diagnosis Date   Carotid artery disease (HCC) 05/08/2012   CEA on left 2013   Coronary artery disease    Diabetes mellitus without complication (HCC)    Food allergy    Alpha Gal allergy    GERD (gastroesophageal reflux disease)    Hyperlipidemia    Hypertensive heart disease without CHF 04/08/2018   Myocardial infarction [facility]) 1993   medically managed   Neuroendocrine carcinoma metastatic to liver (HCC)    Obesity (BMI 30-39.9) 04/08/2018   Prostate cancer (HCC) 08/07/2016   Pulmonary embolism (HCC)    PVC's (premature ventricular contractions) 04/08/2018   Thrombocytopenia (HCC)     Past Surgical History:  Procedure Laterality Date   APPENDECTOMY     CAROTID ENDARTERECTOMY Right 06-07-12   cea   CORONARY ARTERY BYPASS GRAFT N/A 10/18/2020   Procedure: CORONARY ARTERY BYPASS GRAFTING (CABG)x3. LEFT INTERNAL MAMMARY ARTERY. RIGHT ENDOSCOPIC SAPHENOUS VEIN HARVESTING.;  Surgeon: Linden Dolin, MD;  Location: [facility] OR;  Service: Open Heart Surgery;  Laterality: N/A;   CYSTOSCOPY WITH RETROGRADE PYELOGRAM, URETEROSCOPY AND STENT PLACEMENT Right 01/10/2023   Procedure: CYSTOSCOPY WITH RETROGRADE PYELOGRAM, RIGHT DIAGNOSTIC URETEROSCOPY;  Surgeon: Loletta Parish., MD;  Location: [facility] ORS;  Service: Urology;  Laterality: Right;   ENDARTERECTOMY  06/07/2012   Procedure: ENDARTERECTOMY CAROTID;  Surgeon: Chuck Hint, MD;  Location: [facility] OR;  Service: Vascular;  Laterality: Right;  Right Carotid Endarterectomy with Patch Angioplasty   LEFT HEART CATH AND CORONARY ANGIOGRAPHY N/A 10/07/2020   Procedure: LEFT HEART CATH AND CORONARY ANGIOGRAPHY;  Surgeon: Lennette Bihari, MD;  Location: [facility] INVASIVE CV LAB;  Service: Cardiovascular;  Laterality: N/A;   TEE WITHOUT CARDIOVERSION N/A 10/18/2020   Procedure: TRANSESOPHAGEAL ECHOCARDIOGRAM (TEE);  Surgeon: Linden Dolin, MD;  Location: [facility] OR;  Service: Open Heart Surgery;   Laterality: N/A;   TRANSPERINEAL IMPLANT OF RADIATION SEEDS W/ ULTRASOUND      Allergies: Ace inhibitors, Albumin (human), Beef-derived products, Gelfoam-jmi sponge, Heparin, Pork-derived products, Sheep-derived products, and Tetanus toxoids  Medications: Prior to Admission medications   Medication Sig Start Date End Date Taking? Authorizing Provider  amLODipine (NORVASC) 10 MG tablet Take 10 mg by mouth in the morning.    [provider]  apixaban (ELIQUIS) 5 MG TABS tablet Take 1 tablet (5 mg total) by mouth 2 (two) times daily. 10/11/22   Jaci Standard, MD  carvedilol (COREG) 25 MG tablet Take 12.5 mg by mouth 2 (two) times daily with a meal.    [provider]  EPINEPHrine (EPIPEN 2-PAK) 0.3 mg/0.3 mL IJ SOAJ injection Use as directed for life-threatening allergic reaction. 05/10/20   Kozlow, Alvira Philips, MD  eszopiclone (LUNESTA) 1 MG TABS tablet Take 1 tablet (1 mg total) by mouth at bedtime as needed for sleep. Take immediately before bedtime Patient not taking: Reported on 01/08/2023 12/11/22   Jaci Standard, MD  ezetimibe (ZETIA) 10 MG tablet Take 1 tablet (10 mg total) by mouth daily. Needs appointment for future refills 11/09/22   Baldo Daub, MD  fluticasone [facility]) 50 MCG/ACT nasal spray Place 2 sprays into both nostrils daily. 09/19/22   [provider]  losartan (COZAAR) 25 MG tablet Take 25 mg by mouth daily. 11/17/22   [provider]  metFORMIN (GLUCOPHAGE) 1000 MG tablet Take 1,000 mg by mouth 2 (two) times daily with a meal.    [provider]  oxyCODONE-acetaminophen (PERCOCET) 5-325 MG tablet Take 1 tablet by mouth every 6 (six) hours as needed for severe pain or moderate pain (post-operatively). 01/10/23 01/10/24  Loletta Parish., MD  pantoprazole (PROTONIX) 40 MG tablet Take 1 tablet (40 mg total) by mouth daily. Needs appointment for future refills 11/09/22   Baldo Daub, MD  polyethylene glycol (MIRALAX / GLYCOLAX)  17 g packet Take 17 g by mouth daily as needed (constipation).    [provider]  rosuvastatin (CRESTOR) 40 MG tablet Take 1 tablet (40 mg total) by mouth daily. Needs appointment for future refills 12/08/22   Baldo Daub, MD  senna-docusate (SENOKOT-S) 8.6-50 MG tablet Take 1 tablet by mouth 2 (two) times daily. While taking stong pain meds to prevent constipaiton 01/10/23   Manny, Delbert Phenix., MD  Tamsulosin HCl (FLOMAX) 0.4 MG CAPS Take 0.8 mg by mouth at bedtime.    [provider]  traZODone (DESYREL) 50 MG tablet Take 50 mg by mouth at bedtime as needed for sleep.    [provider]  Trospium Chloride 60 MG CP24 Take 1 capsule by mouth daily. 11/25/22   [provider]     Family History  Problem Relation Age of Onset   Cancer Mother    Hyperlipidemia Mother    Hypertension Mother    Heart attack Father    Heart disease Father        Heart Disease before age 33   Hyperlipidemia Father    Hypertension Father    Diabetes Sister    Heart disease Sister        before age 39   Hypertension Sister    Hyperlipidemia Sister    Diabetes Brother    Heart disease Brother        before age 56   Hyperlipidemia Brother  Hypertension Brother     Social History   Socioeconomic History   Marital status: Married    Spouse name: Not on file   Number of children: Not on file   Years of education: Not on file   Highest education level: Not on file  Occupational History   Not on file  Tobacco Use   Smoking status: Never   Smokeless tobacco: Never  Vaping Use   Vaping status: Never Used  Substance and Sexual Activity   Alcohol use: No   Drug use: No   Sexual activity: Not Currently  Other Topics Concern   Not on file  Social History Narrative   Not on file   Social Determinants of Health   Financial Resource Strain: Not on file  Food Insecurity: Not on file  Transportation Needs: Not on file  Physical Activity: Not on file  Stress:  Not on file  Social Connections: Not on file    Review of Systems: A 12 point ROS discussed and pertinent positives are indicated in the HPI above.  All other systems are negative.  Review of Systems  Constitutional:  Positive for fatigue. Negative for appetite change.  Respiratory:  Negative for cough and shortness of breath.   Cardiovascular:  Negative for chest pain and leg swelling.  Gastrointestinal:  Negative for abdominal pain, diarrhea, nausea and vomiting.  Genitourinary:  Negative for difficulty urinating and flank pain.  Musculoskeletal:  Negative for back pain.  Neurological:  Negative for dizziness and headaches.    Vital Signs: BP 134/63   Pulse 63   Temp 100 F (37.8 C) (Temporal)   Resp 18   Ht 5\' 10"  (1.778 m)   Wt 170 lb (77.1 kg)   SpO2 96%   BMI 24.39 kg/m   Physical Exam Constitutional:      General: He is not in acute distress.    Appearance: He is not ill-appearing.  HENT:     Mouth/Throat:     Mouth: Mucous membranes are moist.     Pharynx: Oropharynx is clear.  Cardiovascular:     Rate and Rhythm: Normal rate and regular rhythm.     Pulses: Normal pulses.     Heart sounds: Normal heart sounds.  Pulmonary:     Effort: Pulmonary effort is normal.     Breath sounds: Normal breath sounds.  Abdominal:     General: Bowel sounds are normal.     Palpations: Abdomen is soft.     Tenderness: There is no abdominal tenderness.  Musculoskeletal:     Right lower leg: No edema.     Left lower leg: No edema.  Skin:    General: Skin is warm and dry.  Neurological:     Mental Status: He is alert and oriented to person, place, and time.  Psychiatric:        Mood and Affect: Mood normal.        Behavior: Behavior normal.        Thought Content: Thought content normal.        Judgment: Judgment normal.     Imaging: DG C-Arm 1-60 Min-No Report  Result Date: 01/10/2023 Fluoroscopy was utilized by the requesting physician.  No radiographic  interpretation.    Labs:  CBC: Recent Labs    11/07/22 1457 12/07/22 1355 01/04/23 1342 01/05/23 1436  WBC 8.4 8.3 8.4 8.0  HGB 14.1 13.4 13.0 13.7  HCT 39.3 37.0* 35.8* 39.9  PLT 165 138* 128* 144*  COAGS: No results for input(s): "INR", "APTT" in the last 8760 hours.  BMP: Recent Labs    11/07/22 1457 12/07/22 1355 01/04/23 1342 01/05/23 1436  NA 137 135 134* 134*  K 4.5 4.4 4.3 4.0  CL 104 101 102 104  CO2 28 28 26 24   GLUCOSE 252* 387* 269* 234*  BUN 16 16 23 17   CALCIUM 8.8* 8.7* 9.3 8.9  CREATININE 1.10 1.11 1.31* 1.01  GFRNONAA >60 >60 58* >60    LIVER FUNCTION TESTS: Recent Labs    11/07/22 1457 12/07/22 1355 01/04/23 1342 01/05/23 1436  BILITOT 0.7 0.6 0.7 1.1  AST 10* 11* 14* 18  ALT 8 10 11 16   ALKPHOS 70 71 57 60  PROT 6.2* 6.0* 6.1* 6.9  ALBUMIN 4.2 3.7 3.9 4.2    TUMOR MARKERS: No results for input(s): "AFPTM", "CEA", "CA199", "CHROMGRNA" in the last 8760 hours.  Assessment and Plan:  History of metastatic neuroendocrine cancer; right renal mass: Daniel Gibson, 72 year old male, presents today to the [facility] department for an image-guided right renal mass biopsy.   Risks and benefits of this  procedure were discussed with the patient and/or patient's family including, but not limited to bleeding, infection, damage to adjacent structures or low yield requiring additional tests.  All of the questions were answered and there is agreement to proceed. He has been NPO. He is a full code. Last dose of Eliquis was 01/24/23 in the morning.   Consent signed and in chart.  Thank you for this interesting consult.  I greatly enjoyed meeting Daniel Gibson and look forward to participating in their care.  A copy of this report was sent to the requesting provider on this date.  Electronically Signed: Alwyn Ren, AGACNP-BC [phone] 01/26/2023, 7:17 AM   I spent a total of  30 Minutes   in face to face  in clinical consultation, greater than 50% of which was counseling/coordinating care for right renal mass biopsy

## 2023-01-26 ENCOUNTER — Other Ambulatory Visit (HOSPITAL_COMMUNITY): Payer: Self-pay | Admitting: Urology

## 2023-01-26 ENCOUNTER — Ambulatory Visit (HOSPITAL_COMMUNITY)
Admission: RE | Admit: 2023-01-26 | Discharge: 2023-01-26 | Disposition: A | Discharge status: Home / Self Care | Payer: Medicare HMO | Source: Ambulatory Visit | Attending: Urology | Admitting: Urology

## 2023-01-26 DIAGNOSIS — Z8546 Personal history of malignant neoplasm of prostate: Secondary | ICD-10-CM | POA: Diagnosis not present

## 2023-01-26 DIAGNOSIS — I2510 Atherosclerotic heart disease of native coronary artery without angina pectoris: Secondary | ICD-10-CM | POA: Insufficient documentation

## 2023-01-26 DIAGNOSIS — I252 Old myocardial infarction: Secondary | ICD-10-CM | POA: Diagnosis not present

## 2023-01-26 DIAGNOSIS — I10 Essential (primary) hypertension: Secondary | ICD-10-CM | POA: Insufficient documentation

## 2023-01-26 DIAGNOSIS — D49511 Neoplasm of unspecified behavior of right kidney: Secondary | ICD-10-CM

## 2023-01-26 DIAGNOSIS — C649 Malignant neoplasm of unspecified kidney, except renal pelvis: Secondary | ICD-10-CM | POA: Insufficient documentation

## 2023-01-26 DIAGNOSIS — C7A8 Other malignant neuroendocrine tumors: Secondary | ICD-10-CM

## 2023-01-26 DIAGNOSIS — Z923 Personal history of irradiation: Secondary | ICD-10-CM | POA: Diagnosis not present

## 2023-01-26 DIAGNOSIS — N2889 Other specified disorders of kidney and ureter: Secondary | ICD-10-CM | POA: Diagnosis not present

## 2023-01-26 DIAGNOSIS — E119 Type 2 diabetes mellitus without complications: Secondary | ICD-10-CM | POA: Diagnosis not present

## 2023-01-26 LAB — CBC
HCT: 36.9 % — ABNORMAL LOW (ref 39.0–52.0)
Hemoglobin: 13 g/dL (ref 13.0–17.0)
MCH: 31.6 pg (ref 26.0–34.0)
MCHC: 35.2 g/dL (ref 30.0–36.0)
MCV: 89.8 fL (ref 80.0–100.0)
Platelets: 147 10*3/uL — ABNORMAL LOW (ref 150–400)
RBC: 4.11 MIL/uL — ABNORMAL LOW (ref 4.22–5.81)
RDW: 12 % (ref 11.5–15.5)
WBC: 9.7 10*3/uL (ref 4.0–10.5)
nRBC: 0 % (ref 0.0–0.2)

## 2023-01-26 LAB — PROTIME-INR
INR: 1.2 (ref 0.8–1.2)
Prothrombin Time: 14.9 seconds (ref 11.4–15.2)

## 2023-01-26 LAB — GLUCOSE, CAPILLARY: Glucose-Capillary: 292 mg/dL — ABNORMAL HIGH (ref 70–99)

## 2023-01-26 MED ORDER — FENTANYL CITRATE (PF) 100 MCG/2ML IJ SOLN
INTRAMUSCULAR | Status: AC
  Filled 2023-01-26: qty 2

## 2023-01-26 MED ORDER — MIDAZOLAM HCL 2 MG/2ML IJ SOLN
INTRAMUSCULAR | Status: AC | PRN
  Administered 2023-01-26: 1 mg via INTRAVENOUS
  Administered 2023-01-26: .5 mg via INTRAVENOUS

## 2023-01-26 MED ORDER — MIDAZOLAM HCL 2 MG/2ML IJ SOLN
INTRAMUSCULAR | Status: AC
  Filled 2023-01-26: qty 2

## 2023-01-26 MED ORDER — SODIUM CHLORIDE 0.9 % IV SOLN: INTRAVENOUS | Status: DC

## 2023-01-26 MED ORDER — FENTANYL CITRATE (PF) 100 MCG/2ML IJ SOLN
INTRAMUSCULAR | Status: AC | PRN
  Administered 2023-01-26: 50 ug via INTRAVENOUS

## 2023-01-26 MED ORDER — IOHEXOL 350 MG/ML SOLN
100.0000 mL | Freq: Once | INTRAVENOUS | Status: AC | PRN
  Administered 2023-01-26: 100 mL via INTRAVENOUS

## 2023-01-26 NOTE — Procedures (Signed)
Interventional Radiology Procedure Note  Procedure: CT biopsy of right renal mass  Complications: None immediate  Estimated Blood Loss: None  Recommendations: - Bedrest x 4 hrs - DC home  Signed,  Sterling Big, MD

## 2023-01-31 DIAGNOSIS — E782 Mixed hyperlipidemia: Secondary | ICD-10-CM | POA: Diagnosis not present

## 2023-01-31 DIAGNOSIS — R809 Proteinuria, unspecified: Secondary | ICD-10-CM | POA: Diagnosis not present

## 2023-01-31 DIAGNOSIS — H6993 Unspecified Eustachian tube disorder, bilateral: Secondary | ICD-10-CM | POA: Diagnosis not present

## 2023-01-31 DIAGNOSIS — J309 Allergic rhinitis, unspecified: Secondary | ICD-10-CM | POA: Diagnosis not present

## 2023-01-31 DIAGNOSIS — Z139 Encounter for screening, unspecified: Secondary | ICD-10-CM | POA: Diagnosis not present

## 2023-01-31 DIAGNOSIS — E1129 Type 2 diabetes mellitus with other diabetic kidney complication: Secondary | ICD-10-CM | POA: Diagnosis not present

## 2023-01-31 DIAGNOSIS — G4700 Insomnia, unspecified: Secondary | ICD-10-CM | POA: Diagnosis not present

## 2023-01-31 DIAGNOSIS — I10 Essential (primary) hypertension: Secondary | ICD-10-CM | POA: Diagnosis not present

## 2023-01-31 DIAGNOSIS — Z1331 Encounter for screening for depression: Secondary | ICD-10-CM | POA: Diagnosis not present

## 2023-02-01 ENCOUNTER — Inpatient Hospital Stay: Payer: Medicare HMO

## 2023-02-01 ENCOUNTER — Other Ambulatory Visit: Payer: Self-pay

## 2023-02-01 ENCOUNTER — Inpatient Hospital Stay: Payer: Medicare HMO | Attending: Physician Assistant

## 2023-02-01 ENCOUNTER — Other Ambulatory Visit: Payer: Self-pay | Admitting: Cardiology

## 2023-02-01 VITALS — BP 150/69 | HR 73 | Temp 98.8°F | Resp 18

## 2023-02-01 DIAGNOSIS — C7A8 Other malignant neuroendocrine tumors: Secondary | ICD-10-CM

## 2023-02-01 DIAGNOSIS — E785 Hyperlipidemia, unspecified: Secondary | ICD-10-CM

## 2023-02-01 DIAGNOSIS — C7B8 Other secondary neuroendocrine tumors: Secondary | ICD-10-CM | POA: Insufficient documentation

## 2023-02-01 DIAGNOSIS — I119 Hypertensive heart disease without heart failure: Secondary | ICD-10-CM

## 2023-02-01 DIAGNOSIS — E118 Type 2 diabetes mellitus with unspecified complications: Secondary | ICD-10-CM

## 2023-02-01 DIAGNOSIS — I6523 Occlusion and stenosis of bilateral carotid arteries: Secondary | ICD-10-CM

## 2023-02-01 DIAGNOSIS — I25119 Atherosclerotic heart disease of native coronary artery with unspecified angina pectoris: Secondary | ICD-10-CM

## 2023-02-01 DIAGNOSIS — I493 Ventricular premature depolarization: Secondary | ICD-10-CM

## 2023-02-01 LAB — CMP (CANCER CENTER ONLY)
ALT: 10 U/L (ref 0–44)
AST: 11 U/L — ABNORMAL LOW (ref 15–41)
Albumin: 4 g/dL (ref 3.5–5.0)
Alkaline Phosphatase: 70 U/L (ref 38–126)
Anion gap: 5 (ref 5–15)
BUN: 16 mg/dL (ref 8–23)
CO2: 29 mmol/L (ref 22–32)
Calcium: 8.8 mg/dL — ABNORMAL LOW (ref 8.9–10.3)
Chloride: 101 mmol/L (ref 98–111)
Creatinine: 1.12 mg/dL (ref 0.61–1.24)
GFR, Estimated: >60 mL/min (ref >60)
Glucose, Bld: 332 mg/dL — ABNORMAL HIGH (ref 70–99)
Potassium: 3.9 mmol/L (ref 3.5–5.1)
Sodium: 135 mmol/L (ref 135–145)
Total Bilirubin: 0.7 mg/dL (ref 0.3–1.2)
Total Protein: 6.3 g/dL — ABNORMAL LOW (ref 6.5–8.1)

## 2023-02-01 LAB — CBC WITH DIFFERENTIAL (CANCER CENTER ONLY)
Abs Immature Granulocytes: 0.02 10*3/uL (ref 0.00–0.07)
Basophils Absolute: 0.1 10*3/uL (ref 0.0–0.1)
Basophils Relative: 1 %
Eosinophils Absolute: 0.4 10*3/uL (ref 0.0–0.5)
Eosinophils Relative: 5 %
HCT: 36.1 % — ABNORMAL LOW (ref 39.0–52.0)
Hemoglobin: 13 g/dL (ref 13.0–17.0)
Immature Granulocytes: 0 %
Lymphocytes Relative: 6 %
Lymphs Abs: 0.5 10*3/uL — ABNORMAL LOW (ref 0.7–4.0)
MCH: 31.1 pg (ref 26.0–34.0)
MCHC: 36 g/dL (ref 30.0–36.0)
MCV: 86.4 fL (ref 80.0–100.0)
Monocytes Absolute: 0.7 10*3/uL (ref 0.1–1.0)
Monocytes Relative: 9 %
Neutro Abs: 5.7 10*3/uL (ref 1.7–7.7)
Neutrophils Relative %: 79 %
Platelet Count: 176 10*3/uL (ref 150–400)
RBC: 4.18 MIL/uL — ABNORMAL LOW (ref 4.22–5.81)
RDW: 11.8 % (ref 11.5–15.5)
WBC Count: 7.3 10*3/uL (ref 4.0–10.5)
nRBC: 0 % (ref 0.0–0.2)

## 2023-02-01 MED ORDER — LANREOTIDE ACETATE 120 MG/0.5ML ~~LOC~~ SOLN
120.0000 mg | Freq: Once | SUBCUTANEOUS | Status: AC
  Administered 2023-02-01: 120 mg via SUBCUTANEOUS
  Filled 2023-02-01: qty 120

## 2023-02-06 ENCOUNTER — Encounter: Payer: Self-pay | Admitting: Hematology and Oncology

## 2023-02-12 DIAGNOSIS — C61 Malignant neoplasm of prostate: Secondary | ICD-10-CM | POA: Diagnosis not present

## 2023-02-12 DIAGNOSIS — D49511 Neoplasm of unspecified behavior of right kidney: Secondary | ICD-10-CM | POA: Diagnosis not present

## 2023-02-12 DIAGNOSIS — N35919 Unspecified urethral stricture, male, unspecified site: Secondary | ICD-10-CM | POA: Diagnosis not present

## 2023-02-12 DIAGNOSIS — N5201 Erectile dysfunction due to arterial insufficiency: Secondary | ICD-10-CM | POA: Diagnosis not present

## 2023-02-14 ENCOUNTER — Ambulatory Visit: Payer: Medicare HMO | Attending: Cardiology | Admitting: Cardiology

## 2023-02-14 ENCOUNTER — Encounter: Payer: Self-pay | Admitting: Cardiology

## 2023-02-14 VITALS — BP 148/80 | HR 64 | Ht 70.0 in | Wt 181.0 lb

## 2023-02-14 DIAGNOSIS — Z7901 Long term (current) use of anticoagulants: Secondary | ICD-10-CM | POA: Diagnosis not present

## 2023-02-14 DIAGNOSIS — I25119 Atherosclerotic heart disease of native coronary artery with unspecified angina pectoris: Secondary | ICD-10-CM | POA: Diagnosis not present

## 2023-02-14 DIAGNOSIS — E785 Hyperlipidemia, unspecified: Secondary | ICD-10-CM | POA: Diagnosis not present

## 2023-02-14 DIAGNOSIS — I493 Ventricular premature depolarization: Secondary | ICD-10-CM

## 2023-02-14 DIAGNOSIS — I119 Hypertensive heart disease without heart failure: Secondary | ICD-10-CM

## 2023-02-14 NOTE — Progress Notes (Signed)
Cardiology Office Note:    Date:  02/14/2023   ID:  Daniel Gibson, DOB 07/15/1950, MRN [id number]  PCP:  Ailene Ravel, MD  Cardiologist:  Norman Herrlich, MD    Referring MD: Ailene Ravel, MD    ASSESSMENT:    1. Coronary artery disease involving native heart with angina pectoris, unspecified vessel or lesion type (HCC)   2. Hypertensive heart disease without CHF   3. Hyperlipidemia, unspecified hyperlipidemia type   4. PVC's (premature ventricular contractions)   5. Chronic anticoagulation    PLAN:    In order of problems listed above:  From a cardiology perspective Daniel Gibson is doing well following surgical revascularization he is not having angina he will continue treatment including his beta-blocker and lipid-lowering with combined hide density statin and Zetia Blood pressure is well-controlled on his current regimen continue carvedilol ARB Continue his high intensity statin and Zetia No clinical recurrence continue his beta-blocker And he will continue his current anticoagulant   Next appointment: 6 months   Medication Adjustments/Labs and Tests Ordered: Current medicines are reviewed at length with the patient today.  Concerns regarding medicines are outlined above.  Orders Placed This Encounter  Procedures   EKG 12-Lead   No orders of the defined types were placed in this encounter.    History of Present Illness:    Daniel Gibson is a 72 y.o. male with a hx of  hx of CAD with CABG 10/18/2020 pulmonary embolism  anticoagulation neuroendocrine carcinomamild bilateral carotid stenosis 40 to 59% hypertension hyperlipidemia and PVCs last seen 01/31/22.  Compliance with diet, lifestyle and medications: Yes  He is concerned with the biopsy finding of renal cell cancer and has an upcoming oncology visit From a cardiology perspective he is doing well and is tolerate his immunotherapy without difficulty He continues on lipid-lowering therapy statin Zetia no  muscle pain or weakness He has had no angina edema shortness of breath palpitation or syncope  Recent labs 01/16/2023 LDL 34 cholesterol 98 A1c is elevated 10.4 and is now on additional therapy hemoglobin 13.0 creatinine 1.1-1 potassium 3.9 Past Medical History:  Diagnosis Date   Carotid artery disease (HCC) 05/08/2012   CEA on left 2013   Coronary artery disease    Diabetes mellitus without complication (HCC)    Food allergy    Alpha Gal allergy    GERD (gastroesophageal reflux disease)    Hyperlipidemia    Hypertensive heart disease without CHF 04/08/2018   Myocardial infarction (HCC) 1993   medically managed   Neuroendocrine carcinoma metastatic to liver (HCC)    Obesity (BMI 30-39.9) 04/08/2018   Prostate cancer (HCC) 08/07/2016   Pulmonary embolism (HCC)    PVC's (premature ventricular contractions) 04/08/2018   Thrombocytopenia (HCC)     Current Medications: No outpatient medications have been marked as taking for the 02/14/23 encounter (Office Visit) with Baldo Daub, MD.      EKGs/Labs/Other Studies Reviewed:    The following studies were reviewed today:  EKG Interpretation Date/Time:  Wednesday February 14 2023 09:09:15 EDT Ventricular Rate:  64 PR Interval:  158 QRS Duration:  86 QT Interval:  400 QTC Calculation: 412 R Axis:   46  Text Interpretation: Normal sinus rhythm Normal ECG When compared with ECG of 05-Jan-2023 14:43, No significant change was found Confirmed by Norman Herrlich ([id number]) on 02/14/2023 9:11:33 AM         Recent Labs: 02/01/2023: ALT 10; BUN 16; Creatinine 1.12; Hemoglobin 13.0; Platelet Count 176;  Potassium 3.9; Sodium 135  Recent Lipid Panel    Component Value Date/Time   CHOL 103 01/31/2022 1009   TRIG 68 01/31/2022 1009   HDL 46 01/31/2022 1009   CHOLHDL 2.2 01/31/2022 1009   LDLCALC 42 01/31/2022 1009    Physical Exam:    VS:  Ht 5\' 10"  (1.778 m)   BMI 24.39 kg/m     Wt Readings from Last 3 Encounters:  01/26/23 170  lb (77.1 kg)  01/10/23 170 lb (77.1 kg)  01/05/23 171 lb (77.6 kg)     GEN:  Well nourished, well developed in no acute distress HEENT: Normal NECK: No JVD; No carotid bruits LYMPHATICS: No lymphadenopathy CARDIAC: RRR, no murmurs, rubs, gallops RESPIRATORY:  Clear to auscultation without rales, wheezing or rhonchi  ABDOMEN: Soft, non-tender, non-distended MUSCULOSKELETAL:  No edema; No deformity  SKIN: Warm and dry NEUROLOGIC:  Alert and oriented x 3 PSYCHIATRIC:  Normal affect    Signed, Norman Herrlich, MD  02/14/2023 9:07 AM    [facility]

## 2023-02-14 NOTE — Patient Instructions (Signed)
Medication Instructions:  Your physician recommends that you continue on your current medications as directed. Please refer to the Current Medication list given to you today.  *If you need a refill on your cardiac medications before your next appointment, please call your pharmacy*   Lab Work: NONE If you have labs (blood work) drawn today and your tests are completely normal, you will receive your results only by: MyChart Message (if you have MyChart) OR A paper copy in the mail If you have any lab test that is abnormal or we need to change your treatment, we will call you to review the results.   Testing/Procedures: NONE   Follow-Up: At [facility], you and your health needs are our priority.  As part of our continuing mission to provide you with exceptional heart care, we have created designated Provider Care Teams.  These Care Teams include your primary Cardiologist (physician) and Advanced Practice Providers (APPs -  Physician Assistants and Nurse Practitioners) who all work together to provide you with the care you need, when you need it.  We recommend signing up for the patient portal called "MyChart".  Sign up information is provided on this After Visit Summary.  MyChart is used to connect with patients for Virtual Visits (Telemedicine).  Patients are able to view lab/test results, encounter notes, upcoming appointments, etc.  Non-urgent messages can be sent to your provider as well.   To learn more about what you can do with MyChart, go to [email or web address].    Your next appointment:   1 year(s) Provider:   Brian Munley, MD   Other Instructions   

## 2023-02-22 ENCOUNTER — Telehealth: Payer: Self-pay

## 2023-02-22 NOTE — Telephone Encounter (Signed)
Returned pt call regarding upcoming appointments and reminded pt of 03/01/2023 appts with times. Pt verbalized understanding.

## 2023-02-23 ENCOUNTER — Other Ambulatory Visit: Payer: Self-pay | Admitting: Cardiology

## 2023-02-23 DIAGNOSIS — E785 Hyperlipidemia, unspecified: Secondary | ICD-10-CM

## 2023-02-23 DIAGNOSIS — I119 Hypertensive heart disease without heart failure: Secondary | ICD-10-CM

## 2023-02-23 DIAGNOSIS — E118 Type 2 diabetes mellitus with unspecified complications: Secondary | ICD-10-CM

## 2023-02-23 DIAGNOSIS — I25119 Atherosclerotic heart disease of native coronary artery with unspecified angina pectoris: Secondary | ICD-10-CM

## 2023-02-23 DIAGNOSIS — I493 Ventricular premature depolarization: Secondary | ICD-10-CM

## 2023-02-23 DIAGNOSIS — I6523 Occlusion and stenosis of bilateral carotid arteries: Secondary | ICD-10-CM

## 2023-02-23 NOTE — Telephone Encounter (Signed)
Rx refill sent to pharmacy. 

## 2023-03-01 ENCOUNTER — Encounter: Payer: Self-pay | Admitting: Hematology and Oncology

## 2023-03-01 ENCOUNTER — Inpatient Hospital Stay: Payer: Medicare HMO | Admitting: Hematology and Oncology

## 2023-03-01 ENCOUNTER — Inpatient Hospital Stay: Payer: Medicare HMO

## 2023-03-01 ENCOUNTER — Inpatient Hospital Stay: Payer: Medicare HMO | Attending: Physician Assistant

## 2023-03-01 ENCOUNTER — Other Ambulatory Visit: Payer: Self-pay | Admitting: Hematology and Oncology

## 2023-03-01 VITALS — BP 137/71 | HR 60 | Temp 98.1°F | Resp 13 | Wt 183.4 lb

## 2023-03-01 DIAGNOSIS — C7A8 Other malignant neuroendocrine tumors: Secondary | ICD-10-CM

## 2023-03-01 DIAGNOSIS — C7B8 Other secondary neuroendocrine tumors: Secondary | ICD-10-CM | POA: Insufficient documentation

## 2023-03-01 DIAGNOSIS — C641 Malignant neoplasm of right kidney, except renal pelvis: Secondary | ICD-10-CM | POA: Diagnosis not present

## 2023-03-01 LAB — CBC WITH DIFFERENTIAL (CANCER CENTER ONLY)
Abs Immature Granulocytes: 0.02 10*3/uL (ref 0.00–0.07)
Basophils Absolute: 0.1 10*3/uL (ref 0.0–0.1)
Basophils Relative: 1 %
Eosinophils Absolute: 0.5 10*3/uL (ref 0.0–0.5)
Eosinophils Relative: 6 %
HCT: 37.9 % — ABNORMAL LOW (ref 39.0–52.0)
Hemoglobin: 13.3 g/dL (ref 13.0–17.0)
Immature Granulocytes: 0 %
Lymphocytes Relative: 7 %
Lymphs Abs: 0.6 10*3/uL — ABNORMAL LOW (ref 0.7–4.0)
MCH: 31.7 pg (ref 26.0–34.0)
MCHC: 35.1 g/dL (ref 30.0–36.0)
MCV: 90.2 fL (ref 80.0–100.0)
Monocytes Absolute: 0.8 10*3/uL (ref 0.1–1.0)
Monocytes Relative: 9 %
Neutro Abs: 6.7 10*3/uL (ref 1.7–7.7)
Neutrophils Relative %: 77 %
Platelet Count: 134 10*3/uL — ABNORMAL LOW (ref 150–400)
RBC: 4.2 MIL/uL — ABNORMAL LOW (ref 4.22–5.81)
RDW: 12.3 % (ref 11.5–15.5)
WBC Count: 8.6 10*3/uL (ref 4.0–10.5)
nRBC: 0 % (ref 0.0–0.2)

## 2023-03-01 LAB — CMP (CANCER CENTER ONLY)
ALT: 8 U/L (ref 0–44)
AST: 11 U/L — ABNORMAL LOW (ref 15–41)
Albumin: 4 g/dL (ref 3.5–5.0)
Alkaline Phosphatase: 58 U/L (ref 38–126)
Anion gap: 5 (ref 5–15)
BUN: 15 mg/dL (ref 8–23)
CO2: 28 mmol/L (ref 22–32)
Calcium: 9.2 mg/dL (ref 8.9–10.3)
Chloride: 106 mmol/L (ref 98–111)
Creatinine: 1.17 mg/dL (ref 0.61–1.24)
GFR, Estimated: >60 mL/min (ref >60)
Glucose, Bld: 137 mg/dL — ABNORMAL HIGH (ref 70–99)
Potassium: 4.3 mmol/L (ref 3.5–5.1)
Sodium: 139 mmol/L (ref 135–145)
Total Bilirubin: 0.7 mg/dL (ref 0.3–1.2)
Total Protein: 6 g/dL — ABNORMAL LOW (ref 6.5–8.1)

## 2023-03-01 MED ORDER — LANREOTIDE ACETATE 120 MG/0.5ML ~~LOC~~ SOLN
120.0000 mg | Freq: Once | SUBCUTANEOUS | Status: AC
  Administered 2023-03-01: 120 mg via SUBCUTANEOUS
  Filled 2023-03-01: qty 120

## 2023-03-01 NOTE — Progress Notes (Signed)
[name] Telephone:([phone]) [phone]   [phone]  PROGRESS NOTE:  Patient Care Team: Ailene Ravel, MD as PCP - General (Family Medicine) Othella Boyer, MD as Attending Physician (Cardiology) Chipper Herb, MD (Inactive) as Attending Physician (Radiation Oncology) Barron Alvine, MD (Inactive) as Attending Physician (Urology)  CHIEF COMPLAINTS/PURPOSE OF CONSULTATION:  Right renal mass and peritoneal nodularity  ONCOLOGIC HISTORY: Presented to the [facility] ER on 12/11/2021 for painless hematuria and urinary urgency.  Work-up included urinalysis showed large leukocyte esterase, blood, protein, positive nitrates.  Urine culture grew E. coli.  Patient was treated with cefdinir.  He underwent CT imaging that showed a nonobstructing 6 mm stone in the interpolar left kidney, indeterminate 2.9 x 3.4 x 4.3 cm lesion in the upper pole right kidney, multiple peritoneal and omental nodules concerning for peritoneal carcinomatosis. 01/10/2022: CT chest: Incidental right lower lobe segmental and subsegmental pulmonary emboli.Minimal clot burden without right heart strain.No evidence of intrathoracic metastases.Small focus of ground-glass airspace disease within the right upper lobe likely inflammatory or infectious. Started on Eliquis therapy 01/18/2022: CT guided biopsy of peritoneal mass: Well-differentiated low-grade neuroendocrine neoplasm with features  suspicious carcinoid tumor. Ki-67 shows very low proliferative index score.  01/30/2022: PET DOTATATE showed extensive tracer avid peritoneal metastasis and tracer avid liver metastasis to the posterior right hepatic lobe. 02/01/2022: Start lanreotide monthly injections 6.   01/26/2023: CT renal biopsy showed low grade papillary carcinoma of the kidney. Urology opted for surveillance rather than surgery.   INTERIM HISTORY:  Daniel Gibson returns today for a follow up visit for a low grade neuroendocrine  carcinoma.   On exam today Daniel Gibson reports he has been well overall in the interim since her last visit.  He reports that he is tolerating his lanreotide shots well with only some fatigue and no nausea, Mi, or diarrhea.  He is not having any blood in the stool or dark stools.  His appetite is good and his weight is increasing.  He notes he is not currently in any pain anywhere.  Overall he is willing and able to proceed with treatment at this time.[name]  He denies fevers, chills,sweats, shortness of breath, chest pain, cough, flushing or palpitations. He has no other complaints. Rest of the 10 point ROS is below.   The bulk of our discussion focused on the results of his kidney biopsy showing papillary kidney cancer, low grade.  He is being followed by Dr. Berneice Heinrich at [facility] who notes that the odds of metastasis of this cancer are extraordinarily low and therefore he does not recommend resection.  At this time surveillance is his recommendation.  Given these findings we will keep a close eye on this tumor during our serial imaging for his neuroendocrine tumor.   MEDICAL HISTORY:  Past Medical History:  Diagnosis Date   Carotid artery disease (HCC) 05/08/2012   CEA on left 2013   Coronary artery disease    Diabetes mellitus without complication (HCC)    Food allergy    Alpha Gal allergy    GERD (gastroesophageal reflux disease)    Hyperlipidemia    Hypertensive heart disease without CHF 04/08/2018   Myocardial infarction [facility]) 1993   medically managed   Neuroendocrine carcinoma metastatic to liver (HCC)    Obesity (BMI 30-39.9) 04/08/2018   Prostate cancer (HCC) 08/07/2016   Pulmonary embolism (HCC)    PVC's (premature ventricular contractions) 04/08/2018   Thrombocytopenia (HCC)     SURGICAL HISTORY: Past  Surgical History:  Procedure Laterality Date   APPENDECTOMY     CAROTID ENDARTERECTOMY Right 06-07-12   cea   CORONARY ARTERY BYPASS GRAFT N/A 10/18/2020   Procedure:  CORONARY ARTERY BYPASS GRAFTING (CABG)x3. LEFT INTERNAL MAMMARY ARTERY. RIGHT ENDOSCOPIC SAPHENOUS VEIN HARVESTING.;  Surgeon: Linden Dolin, MD;  Location: [facility] OR;  Service: Open Heart Surgery;  Laterality: N/A;   CYSTOSCOPY WITH RETROGRADE PYELOGRAM, URETEROSCOPY AND STENT PLACEMENT Right 01/10/2023   Procedure: CYSTOSCOPY WITH RETROGRADE PYELOGRAM, RIGHT DIAGNOSTIC URETEROSCOPY;  Surgeon: Loletta Parish., MD;  Location: [facility] ORS;  Service: Urology;  Laterality: Right;   ENDARTERECTOMY  06/07/2012   Procedure: ENDARTERECTOMY CAROTID;  Surgeon: Chuck Hint, MD;  Location: [facility] OR;  Service: Vascular;  Laterality: Right;  Right Carotid Endarterectomy with Patch Angioplasty   LEFT HEART CATH AND CORONARY ANGIOGRAPHY N/A 10/07/2020   Procedure: LEFT HEART CATH AND CORONARY ANGIOGRAPHY;  Surgeon: Lennette Bihari, MD;  Location: [facility] INVASIVE CV LAB;  Service: Cardiovascular;  Laterality: N/A;   TEE WITHOUT CARDIOVERSION N/A 10/18/2020   Procedure: TRANSESOPHAGEAL ECHOCARDIOGRAM (TEE);  Surgeon: Linden Dolin, MD;  Location[facility] OR;  Service: Open Heart Surgery;  Laterality: N/A;   TRANSPERINEAL IMPLANT OF RADIATION SEEDS W/ ULTRASOUND      SOCIAL HISTORY: Social History   Socioeconomic History   Marital status: Married    Spouse name: Not on file   Number of children: Not on file   Years of education: Not on file   Highest education level: Not on file  Occupational History   Not on file  Tobacco Use   Smoking status: Never   Smokeless tobacco: Never  Vaping Use   Vaping status: Never Used  Substance and Sexual Activity   Alcohol use: No   Drug use: No   Sexual activity: Not Currently  Other Topics Concern   Not on file  Social History Narrative   Not on file   Social Determinants of Health   Financial Resource Strain: Not on file  Food Insecurity: Not on file  Transportation Needs: Not on file  Physical Activity: Not on file  Stress: Not on file  Social  Connections: Not on file  Intimate Partner Violence: Not on file    FAMILY HISTORY: Family History  Problem Relation Age of Onset   Cancer Mother    Hyperlipidemia Mother    Hypertension Mother    Heart attack Father    Heart disease Father        Heart Disease before age 69   Hyperlipidemia Father    Hypertension Father    Diabetes Sister    Heart disease Sister        before age 30   Hypertension Sister    Hyperlipidemia Sister    Diabetes Brother    Heart disease Brother        before age 35   Hyperlipidemia Brother    Hypertension Brother     ALLERGIES:  is allergic to ace inhibitors, albumin (human), beef-derived products, gelfoam-jmi sponge, heparin, pork-derived products, sheep-derived products, and tetanus toxoids.  MEDICATIONS:  Current Outpatient Medications  Medication Sig Dispense Refill   amLODipine (NORVASC) 10 MG tablet Take 10 mg by mouth in the morning.     apixaban (ELIQUIS) 5 MG TABS tablet Take 1 tablet (5 mg total) by mouth 2 (two) times daily. 60 tablet 5   carvedilol (COREG) 25 MG tablet Take 12.5 mg by mouth 2 (two) times daily with a meal.  EPINEPHrine (EPIPEN 2-PAK) 0.3 mg/0.3 mL IJ SOAJ injection Use as directed for life-threatening allergic reaction. 2 each 3   ezetimibe (ZETIA) 10 MG tablet TAKE 1 TABLET BY MOUTH EVERY DAY 90 tablet 1   fluticasone (FLONASE) 50 MCG/ACT nasal spray Place 2 sprays into both nostrils daily.     glimepiride (AMARYL) 4 MG tablet Take 4 mg by mouth daily with breakfast.     losartan (COZAAR) 25 MG tablet Take 25 mg by mouth daily.     metFORMIN (GLUCOPHAGE) 1000 MG tablet Take 1,000 mg by mouth 2 (two) times daily with a meal.     oxyCODONE-acetaminophen (PERCOCET) 5-325 MG tablet Take 1 tablet by mouth every 6 (six) hours as needed for severe pain or moderate pain (post-operatively). 10 tablet 0   pantoprazole (PROTONIX) 40 MG tablet Take 1 tablet (40 mg total) by mouth daily. 90 tablet 1   polyethylene glycol  (MIRALAX / GLYCOLAX) 17 g packet Take 17 g by mouth daily as needed (constipation).     rosuvastatin (CRESTOR) 40 MG tablet Take 1 tablet (40 mg total) by mouth daily. Needs appointment for future refills 90 tablet 0   senna-docusate (SENOKOT-S) 8.6-50 MG tablet Take 1 tablet by mouth 2 (two) times daily. While taking stong pain meds to prevent constipaiton 10 tablet 0   Tamsulosin HCl (FLOMAX) 0.4 MG CAPS Take 0.8 mg by mouth at bedtime.     traZODone (DESYREL) 50 MG tablet Take 50 mg by mouth at bedtime as needed for sleep.     Trospium Chloride 60 MG CP24 Take 1 capsule by mouth daily.     No current facility-administered medications for this visit.    REVIEW OF SYSTEMS:   Constitutional: ( - ) fevers, ( - )  chills , ( - ) night sweats Eyes: ( - ) blurriness of vision, ( - ) double vision, ( - ) watery eyes Ears, nose, mouth, throat, and face: ( - ) mucositis, ( - ) sore throat Respiratory: ( - ) cough, ( - ) dyspnea, ( - ) wheezes Cardiovascular: ( - ) palpitation, ( - ) chest discomfort, ( - ) lower extremity swelling Gastrointestinal:  ( - ) nausea, ( - ) heartburn, ( - ) change in bowel habits Skin: ( - ) abnormal skin rashes Lymphatics: ( - ) new lymphadenopathy, ( - ) easy bruising Neurological: ( - ) numbness, ( - ) tingling, ( - ) new weaknesses Behavioral/Psych: ( - ) mood change, ( - ) new changes  All other systems were reviewed with the patient and are negative.  PHYSICAL EXAMINATION: ECOG PERFORMANCE STATUS: 0 - Asymptomatic  Vitals:   03/01/23 1346  BP: 137/71  Pulse: 60  Resp: 13  Temp: 98.1 F (36.7 C)  SpO2: 98%   Filed Weights   03/01/23 1346  Weight: 183 lb 6.4 oz (83.2 kg)    GENERAL: well appearing Caucasian male in NAD  SKIN: skin color, texture, turgor are normal, no rashes or significant lesions EYES: conjunctiva are pink and non-injected, sclera clear LUNGS: clear to auscultation and percussion with normal breathing effort HEART: regular rate &  rhythm and no murmurs and no lower extremity edema Musculoskeletal: no cyanosis of digits and no clubbing  PSYCH: alert & oriented x 3, fluent speech NEURO: no focal motor/sensory deficits  LABORATORY DATA:  I have reviewed the data as listed    Latest Ref Rng & Units 03/01/2023    1:24 PM 02/01/2023    2:21  PM 01/26/2023    7:23 AM  CBC  WBC 4.0 - 10.5 K/uL 8.6  7.3  9.7   Hemoglobin 13.0 - 17.0 g/dL 40.9  81.1  91.4   Hematocrit 39.0 - 52.0 % 37.9  36.1  36.9   Platelets 150 - 400 K/uL 134  176  147        Latest Ref Rng & Units 03/01/2023    1:24 PM 02/01/2023    2:21 PM 01/05/2023    2:36 PM  CMP  Glucose 70 - 99 mg/dL 782  956  213   BUN 8 - 23 mg/dL 15  16  17    Creatinine 0.61 - 1.24 mg/dL 0.86  5.78  4.69   Sodium 135 - 145 mmol/L 139  135  134   Potassium 3.5 - 5.1 mmol/L 4.3  3.9  4.0   Chloride 98 - 111 mmol/L 106  101  104   CO2 22 - 32 mmol/L 28  29  24    Calcium 8.9 - 10.3 mg/dL 9.2  8.8  8.9   Total Protein 6.5 - 8.1 g/dL 6.0  6.3  6.9   Total Bilirubin 0.3 - 1.2 mg/dL 0.7  0.7  1.1   Alkaline Phos 38 - 126 U/L 58  70  60   AST 15 - 41 U/L 11  11  18    ALT 0 - 44 U/L 8  10  16       RADIOGRAPHIC STUDIES: No results found.  ASSESSMENT & PLAN Daniel Gibson is a 72 y.o. male who presents for a follow up for newly diagnosed well differentiated neuroendocrine carcinoma, low grade.   #Well differentiated neuroendocrine carcinoma, low grade: --CT guided peritoneal mass biopsy on 01/18/2022 confirmed above diagnosis. --PET DOTATATE was completed on 01/30/2022, report pending --Recommend monthly lanreotide injection q 28 days. Reviewed dose, frequency and common side effects including injection site reaction, cholelithiasis, gallbladder sludge, hyperglycemia, hypoglycemia, thyroid dysfunction and bradycardia.  --Labs from today were reviewed and require no intervention. Baseline Chromogranin levels were 159.7 on 02/01/2022.  PLAN: --Continue with monthly lanreotide  injection q 28 days. --CBC and CMP every month before treatment. Chromagranin levels every 3 months --labs today show white blood cell 8.6, hemoglobin 13.3, MCV 90.2, and platelets of 134 additionally creatinine was 1.17 and AST and ALT were within normal limits. --chromogranin A last 51.5 on 02/01/23 --last CT scan on 11/24/2022 showed what appeared to be progression of certain areas, most condemningly the the renal mass. Will repeat scan prior to next visit.  --Urology following for renal mass, details noted below.  --RTC in 2 months for a toxicity evaluation and continued follow up   # Papillary Carcinoma of the Kidney  --biopsy of renal mass performed on 01/26/2023, showed papillary renal cell carcinoma, solid type 1, nuclear grade 2  --urology declined surgery at this time in favor of surveillance  --continue to monitor with serial CT scans.   Orders Placed This Encounter  Procedures   CT CHEST ABDOMEN PELVIS W CONTRAST    Standing Status:   Future    Standing Expiration Date:   02/29/2024    Order Specific Question:   If indicated for the ordered procedure, I authorize the administration of contrast media per Radiology protocol    Answer:   Yes    Order Specific Question:   Does the patient have a contrast media/X-ray dye allergy?    Answer:   No    Order Specific Question:  Preferred imaging location?    Answer:   [facility]    Order Specific Question:   If indicated for the ordered procedure, I authorize the administration of oral contrast media per Radiology protocol    Answer:   Yes    All questions were answered. The patient knows to call the clinic with any problems, questions or concerns.  I have spent a total of 30 minutes minutes of face-to-face and non-face-to-face time, preparing to see the patient,  performing a medically appropriate examination, counseling and educating the patient, ordering tests/procedures,documenting clinical information in the electronic  health record, independently interpreting results and communicating results to the patient, and care coordination.  Ulysees Barns, MD Department of Hematology/Oncology [facility] at [facility] Phone: [phone] Pager: [phone] Email: [email or web address]

## 2023-03-02 ENCOUNTER — Other Ambulatory Visit: Payer: Self-pay | Admitting: Cardiology

## 2023-03-02 LAB — CHROMOGRANIN A: Chromogranin A (ng/mL): 70.6 ng/mL (ref 0.0–101.8)

## 2023-03-02 NOTE — Telephone Encounter (Signed)
Rx refill sent to pharmacy. 

## 2023-03-08 ENCOUNTER — Ambulatory Visit (HOSPITAL_COMMUNITY)
Admission: RE | Admit: 2023-03-08 | Discharge: 2023-03-08 | Disposition: A | Discharge status: Home / Self Care | Payer: Medicare HMO | Source: Ambulatory Visit | Attending: Hematology and Oncology | Admitting: Hematology and Oncology

## 2023-03-08 DIAGNOSIS — C7B8 Other secondary neuroendocrine tumors: Secondary | ICD-10-CM | POA: Insufficient documentation

## 2023-03-08 DIAGNOSIS — C787 Secondary malignant neoplasm of liver and intrahepatic bile duct: Secondary | ICD-10-CM | POA: Diagnosis not present

## 2023-03-08 DIAGNOSIS — I700 Atherosclerosis of aorta: Secondary | ICD-10-CM | POA: Diagnosis not present

## 2023-03-08 DIAGNOSIS — C7901 Secondary malignant neoplasm of right kidney and renal pelvis: Secondary | ICD-10-CM | POA: Diagnosis not present

## 2023-03-08 DIAGNOSIS — C7A8 Other malignant neuroendocrine tumors: Secondary | ICD-10-CM | POA: Insufficient documentation

## 2023-03-08 MED ORDER — IOHEXOL 300 MG/ML  SOLN
100.0000 mL | Freq: Once | INTRAMUSCULAR | Status: AC | PRN
  Administered 2023-03-08: 100 mL via INTRAVENOUS

## 2023-03-15 ENCOUNTER — Encounter: Payer: Self-pay | Admitting: Hematology and Oncology

## 2023-03-29 ENCOUNTER — Inpatient Hospital Stay: Payer: Medicare HMO | Attending: Physician Assistant | Admitting: Hematology and Oncology

## 2023-03-29 ENCOUNTER — Inpatient Hospital Stay: Payer: Medicare HMO

## 2023-03-29 ENCOUNTER — Other Ambulatory Visit: Payer: Self-pay | Admitting: Hematology and Oncology

## 2023-03-29 VITALS — BP 156/80 | HR 62 | Temp 97.6°F | Resp 13 | Wt 187.3 lb

## 2023-03-29 DIAGNOSIS — C7B8 Other secondary neuroendocrine tumors: Secondary | ICD-10-CM | POA: Diagnosis not present

## 2023-03-29 DIAGNOSIS — C7A8 Other malignant neuroendocrine tumors: Secondary | ICD-10-CM

## 2023-03-29 DIAGNOSIS — C641 Malignant neoplasm of right kidney, except renal pelvis: Secondary | ICD-10-CM | POA: Insufficient documentation

## 2023-03-29 LAB — CBC WITH DIFFERENTIAL (CANCER CENTER ONLY)
Abs Immature Granulocytes: 0.03 10*3/uL (ref 0.00–0.07)
Basophils Absolute: 0.1 10*3/uL (ref 0.0–0.1)
Basophils Relative: 1 %
Eosinophils Absolute: 0.6 10*3/uL — ABNORMAL HIGH (ref 0.0–0.5)
Eosinophils Relative: 7 %
HCT: 40.2 % (ref 39.0–52.0)
Hemoglobin: 13.9 g/dL (ref 13.0–17.0)
Immature Granulocytes: 0 %
Lymphocytes Relative: 8 %
Lymphs Abs: 0.6 10*3/uL — ABNORMAL LOW (ref 0.7–4.0)
MCH: 31 pg (ref 26.0–34.0)
MCHC: 34.6 g/dL (ref 30.0–36.0)
MCV: 89.5 fL (ref 80.0–100.0)
Monocytes Absolute: 0.7 10*3/uL (ref 0.1–1.0)
Monocytes Relative: 9 %
Neutro Abs: 6.2 10*3/uL (ref 1.7–7.7)
Neutrophils Relative %: 75 %
Platelet Count: 147 10*3/uL — ABNORMAL LOW (ref 150–400)
RBC: 4.49 MIL/uL (ref 4.22–5.81)
RDW: 12.1 % (ref 11.5–15.5)
WBC Count: 8.3 10*3/uL (ref 4.0–10.5)
nRBC: 0 % (ref 0.0–0.2)

## 2023-03-29 LAB — CMP (CANCER CENTER ONLY)
ALT: 10 U/L (ref 0–44)
AST: 12 U/L — ABNORMAL LOW (ref 15–41)
Albumin: 4.2 g/dL (ref 3.5–5.0)
Alkaline Phosphatase: 63 U/L (ref 38–126)
Anion gap: 5 (ref 5–15)
BUN: 14 mg/dL (ref 8–23)
CO2: 27 mmol/L (ref 22–32)
Calcium: 9.4 mg/dL (ref 8.9–10.3)
Chloride: 106 mmol/L (ref 98–111)
Creatinine: 1.24 mg/dL (ref 0.61–1.24)
GFR, Estimated: >60 mL/min (ref >60)
Glucose, Bld: 171 mg/dL — ABNORMAL HIGH (ref 70–99)
Potassium: 4.2 mmol/L (ref 3.5–5.1)
Sodium: 138 mmol/L (ref 135–145)
Total Bilirubin: 0.7 mg/dL (ref 0.3–1.2)
Total Protein: 6.3 g/dL — ABNORMAL LOW (ref 6.5–8.1)

## 2023-03-29 MED ORDER — LANREOTIDE ACETATE 120 MG/0.5ML ~~LOC~~ SOLN
120.0000 mg | Freq: Once | SUBCUTANEOUS | Status: AC
  Administered 2023-03-29: 120 mg via SUBCUTANEOUS
  Filled 2023-03-29: qty 120

## 2023-03-29 NOTE — Progress Notes (Signed)
[facility] [phone]   Fax:([phone]  PROGRESS NOTE:  Patient Care Team: Ailene Ravel, MD as PCP - General (Family Medicine) Othella Boyer, MD as Attending Physician (Cardiology) Chipper Herb, MD (Inactive) as Attending Physician (Radiation Oncology) Barron Alvine, MD (Inactive) as Attending Physician (Urology)  CHIEF COMPLAINTS/PURPOSE OF CONSULTATION:  Right renal mass and peritoneal nodularity  ONCOLOGIC HISTORY: Presented to the [facility] ER on 12/11/2021 for painless hematuria and urinary urgency.  Work-up included urinalysis showed large leukocyte esterase, blood, protein, positive nitrates.  Urine culture grew E. coli.  Patient was treated with cefdinir.  He underwent CT imaging that showed a nonobstructing 6 mm stone in the interpolar left kidney, indeterminate 2.9 x 3.4 x 4.3 cm lesion in the upper pole right kidney, multiple peritoneal and omental nodules concerning for peritoneal carcinomatosis. 01/10/2022: CT chest: Incidental right lower lobe segmental and subsegmental pulmonary emboli.Minimal clot burden without right heart strain.No evidence of intrathoracic metastases.Small focus of ground-glass airspace disease within the right upper lobe likely inflammatory or infectious. Started on Eliquis therapy 01/18/2022: CT guided biopsy of peritoneal mass: Well-differentiated low-grade neuroendocrine neoplasm with features  suspicious carcinoid tumor. Ki-67 shows very low proliferative index score.  01/30/2022: PET DOTATATE showed extensive tracer avid peritoneal metastasis and tracer avid liver metastasis to the posterior right hepatic lobe. 02/01/2022: Start lanreotide monthly injections 6.   01/26/2023: CT renal biopsy showed low grade papillary carcinoma of the kidney. Urology opted for surveillance rather than surgery.   INTERIM HISTORY:  Daniel Gibson returns today for a follow up visit for a low grade neuroendocrine  carcinoma.   On exam today Daniel Gibson he reports he has been well overall in the interim since her last visit.  He feels like he has good energy levels and strong appetite.  His weight has been increasing.  He is not currently experiencing any pain.  He is tolerating the lanreotide shots without difficulties.  He is not having any trouble with nausea, vomiting, or diarrhea.  Overall he is willing and able to proceed with lanreotide shots at this time.[name] He has no other complaints. Rest of the 10 point ROS is below.   MEDICAL HISTORY:  Past Medical History:  Diagnosis Date   Carotid artery disease (HCC) 05/08/2012   CEA on left 2013   Coronary artery disease    Diabetes mellitus without complication (HCC)    Food allergy    Alpha Gal allergy    GERD (gastroesophageal reflux disease)    Hyperlipidemia    Hypertensive heart disease without CHF 04/08/2018   Myocardial infarction [facility]) 1993   medically managed   Neuroendocrine carcinoma metastatic to liver (HCC)    Obesity (BMI 30-39.9) 04/08/2018   Prostate cancer (HCC) 08/07/2016   Pulmonary embolism (HCC)    PVC's (premature ventricular contractions) 04/08/2018   Thrombocytopenia (HCC)     SURGICAL HISTORY: Past Surgical History:  Procedure Laterality Date   APPENDECTOMY     CAROTID ENDARTERECTOMY Right 06-07-12   cea   CORONARY ARTERY BYPASS GRAFT N/A 10/18/2020   Procedure: CORONARY ARTERY BYPASS GRAFTING (CABG)x3. LEFT INTERNAL MAMMARY ARTERY. RIGHT ENDOSCOPIC SAPHENOUS VEIN HARVESTING.;  Surgeon: Linden Dolin, MD;  Location: [facility] OR;  Service: Open Heart Surgery;  Laterality: N/A;   CYSTOSCOPY WITH RETROGRADE PYELOGRAM, URETEROSCOPY AND STENT PLACEMENT Right 01/10/2023   Procedure: CYSTOSCOPY WITH RETROGRADE PYELOGRAM, RIGHT DIAGNOSTIC URETEROSCOPY;  Surgeon: Loletta Parish., MD;  Location: [facility] ORS;  Service: Urology;  Laterality:  Right;   ENDARTERECTOMY  06/07/2012   Procedure: ENDARTERECTOMY CAROTID;  Surgeon:  Chuck Hint, MD;  Location: [facility] OR;  Service: Vascular;  Laterality: Right;  Right Carotid Endarterectomy with Patch Angioplasty   LEFT HEART CATH AND CORONARY ANGIOGRAPHY N/A 10/07/2020   Procedure: LEFT HEART CATH AND CORONARY ANGIOGRAPHY;  Surgeon: Lennette Bihari, MD;  Location: [facility] INVASIVE CV LAB;  Service: Cardiovascular;  Laterality: N/A;   TEE WITHOUT CARDIOVERSION N/A 10/18/2020   Procedure: TRANSESOPHAGEAL ECHOCARDIOGRAM (TEE);  Surgeon: Linden Dolin, MD;  Location: [facility] OR;  Service: Open Heart Surgery;  Laterality: N/A;   TRANSPERINEAL IMPLANT OF RADIATION SEEDS W/ ULTRASOUND      SOCIAL HISTORY: Social History   Socioeconomic History   Marital status: Married    Spouse name: Not on file   Number of children: Not on file   Years of education: Not on file   Highest education level: Not on file  Occupational History   Not on file  Tobacco Use   Smoking status: Never   Smokeless tobacco: Never  Vaping Use   Vaping status: Never Used  Substance and Sexual Activity   Alcohol use: No   Drug use: No   Sexual activity: Not Currently  Other Topics Concern   Not on file  Social History Narrative   Not on file   Social Determinants of Health   Financial Resource Strain: Not on file  Food Insecurity: Not on file  Transportation Needs: Not on file  Physical Activity: Not on file  Stress: Not on file  Social Connections: Not on file  Intimate Partner Violence: Not on file    FAMILY HISTORY: Family History  Problem Relation Age of Onset   Cancer Mother    Hyperlipidemia Mother    Hypertension Mother    Heart attack Father    Heart disease Father        Heart Disease before age 70   Hyperlipidemia Father    Hypertension Father    Diabetes Sister    Heart disease Sister        before age 57   Hypertension Sister    Hyperlipidemia Sister    Diabetes Brother    Heart disease Brother        before age 25   Hyperlipidemia Brother    Hypertension  Brother     ALLERGIES:  is allergic to ace inhibitors, albumin (human), beef-derived products, gelfoam-jmi sponge, heparin, pork-derived products, sheep-derived products, and tetanus toxoids.  MEDICATIONS:  Current Outpatient Medications  Medication Sig Dispense Refill   amLODipine (NORVASC) 10 MG tablet Take 10 mg by mouth in the morning.     apixaban (ELIQUIS) 5 MG TABS tablet Take 1 tablet (5 mg total) by mouth 2 (two) times daily. 60 tablet 5   carvedilol (COREG) 25 MG tablet Take 12.5 mg by mouth 2 (two) times daily with a meal.     EPINEPHrine (EPIPEN 2-PAK) 0.3 mg/0.3 mL IJ SOAJ injection Use as directed for life-threatening allergic reaction. 2 each 3   ezetimibe (ZETIA) 10 MG tablet TAKE 1 TABLET BY MOUTH EVERY DAY 90 tablet 1   fluticasone (FLONASE) 50 MCG/ACT nasal spray Place 2 sprays into both nostrils daily.     glimepiride (AMARYL) 4 MG tablet Take 4 mg by mouth daily with breakfast.     losartan (COZAAR) 25 MG tablet Take 25 mg by mouth daily.     metFORMIN (GLUCOPHAGE) 1000 MG tablet Take 1,000 mg by mouth 2 (two)  times daily with a meal.     oxyCODONE-acetaminophen (PERCOCET) 5-325 MG tablet Take 1 tablet by mouth every 6 (six) hours as needed for severe pain or moderate pain (post-operatively). 10 tablet 0   pantoprazole (PROTONIX) 40 MG tablet Take 1 tablet (40 mg total) by mouth daily. 90 tablet 1   polyethylene glycol (MIRALAX / GLYCOLAX) 17 g packet Take 17 g by mouth daily as needed (constipation).     rosuvastatin (CRESTOR) 40 MG tablet Take 1 tablet (40 mg total) by mouth daily. 90 tablet 3   senna-docusate (SENOKOT-S) 8.6-50 MG tablet Take 1 tablet by mouth 2 (two) times daily. While taking stong pain meds to prevent constipaiton 10 tablet 0   Tamsulosin HCl (FLOMAX) 0.4 MG CAPS Take 0.8 mg by mouth at bedtime.     traZODone (DESYREL) 50 MG tablet Take 50 mg by mouth at bedtime as needed for sleep.     Trospium Chloride 60 MG CP24 Take 1 capsule by mouth daily.      No current facility-administered medications for this visit.    REVIEW OF SYSTEMS:   Constitutional: ( - ) fevers, ( - )  chills , ( - ) night sweats Eyes: ( - ) blurriness of vision, ( - ) double vision, ( - ) watery eyes Ears, nose, mouth, throat, and face: ( - ) mucositis, ( - ) sore throat Respiratory: ( - ) cough, ( - ) dyspnea, ( - ) wheezes Cardiovascular: ( - ) palpitation, ( - ) chest discomfort, ( - ) lower extremity swelling Gastrointestinal:  ( - ) nausea, ( - ) heartburn, ( - ) change in bowel habits Skin: ( - ) abnormal skin rashes Lymphatics: ( - ) new lymphadenopathy, ( - ) easy bruising Neurological: ( - ) numbness, ( - ) tingling, ( - ) new weaknesses Behavioral/Psych: ( - ) mood change, ( - ) new changes  All other systems were reviewed with the patient and are negative.  PHYSICAL EXAMINATION: ECOG PERFORMANCE STATUS: 0 - Asymptomatic  Vitals:   03/29/23 1426  BP: (!) 156/80  Pulse: 62  Resp: 13  Temp: 97.6 F (36.4 C)  SpO2: 96%    Filed Weights   03/29/23 1426  Weight: 187 lb 4.8 oz (85 kg)     GENERAL: well appearing Caucasian male in NAD  SKIN: skin color, texture, turgor are normal, no rashes or significant lesions EYES: conjunctiva are pink and non-injected, sclera clear LUNGS: clear to auscultation and percussion with normal breathing effort HEART: regular rate & rhythm and no murmurs and no lower extremity edema Musculoskeletal: no cyanosis of digits and no clubbing  PSYCH: alert & oriented x 3, fluent speech NEURO: no focal motor/sensory deficits  LABORATORY DATA:  I have reviewed the data as listed    Latest Ref Rng & Units 03/29/2023    2:09 PM 03/01/2023    1:24 PM 02/01/2023    2:21 PM  CBC  WBC 4.0 - 10.5 K/uL 8.3  8.6  7.3   Hemoglobin 13.0 - 17.0 g/dL 16.1  09.6  04.5   Hematocrit 39.0 - 52.0 % 40.2  37.9  36.1   Platelets 150 - 400 K/uL 147  134  176        Latest Ref Rng & Units 03/29/2023    2:09 PM 03/01/2023    1:24 PM  02/01/2023    2:21 PM  CMP  Glucose 70 - 99 mg/dL 409  811  914  BUN 8 - 23 mg/dL 14  15  16    Creatinine 0.61 - 1.24 mg/dL 9.60  4.54  0.98   Sodium 135 - 145 mmol/L 138  139  135   Potassium 3.5 - 5.1 mmol/L 4.2  4.3  3.9   Chloride 98 - 111 mmol/L 106  106  101   CO2 22 - 32 mmol/L 27  28  29    Calcium 8.9 - 10.3 mg/dL 9.4  9.2  8.8   Total Protein 6.5 - 8.1 g/dL 6.3  6.0  6.3   Total Bilirubin 0.3 - 1.2 mg/dL 0.7  0.7  0.7   Alkaline Phos 38 - 126 U/L 63  58  70   AST 15 - 41 U/L 12  11  11    ALT 0 - 44 U/L 10  8  10       RADIOGRAPHIC STUDIES: No results found.  ASSESSMENT & PLAN TAIJON VINK is a 72 y.o. male who presents for a follow up for newly diagnosed well differentiated neuroendocrine carcinoma, low grade.   #Well differentiated neuroendocrine carcinoma, low grade: --CT guided peritoneal mass biopsy on 01/18/2022 confirmed above diagnosis. --PET DOTATATE was completed on 01/30/2022, report pending --Recommend monthly lanreotide injection q 28 days. Reviewed dose, frequency and common side effects including injection site reaction, cholelithiasis, gallbladder sludge, hyperglycemia, hypoglycemia, thyroid dysfunction and bradycardia.  --Labs from today were reviewed and require no intervention. Baseline Chromogranin levels were 159.7 on 02/01/2022.  PLAN: --Continue with monthly lanreotide injection q 28 days. --CBC and CMP every month before treatment. Chromagranin levels every 3 months --labs today show white blood cell 8.3, hemoglobin 13.9, platelets 147, MCV 89.5 additionally creatinine was 1.24 and AST and ALT were within normal limits. --chromogranin A last 64.1 on 03/29/2023.  --last CT scan on 03/08/2023 showed the hepatic lesions are similar but there is a increase in the splenic lesions and what appeared to be a mixed response to therapy with new left upper quadrant omental nodules, however an index gastrocolic ligament implant is no longer visible. --Urology  following for renal mass, details noted below.  --RTC in 2 months for a toxicity evaluation and continued follow up. CT scan at that time.   # Papillary Carcinoma of the Kidney  --biopsy of renal mass performed on 01/26/2023, showed papillary renal cell carcinoma, solid type 1, nuclear grade 2  --urology declined surgery at this time in favor of surveillance  --continue to monitor with serial CT scans.   No orders of the defined types were placed in this encounter.   All questions were answered. The patient knows to call the clinic with any problems, questions or concerns.  I have spent a total of 30 minutes minutes of face-to-face and non-face-to-face time, preparing to see the patient,  performing a medically appropriate examination, counseling and educating the patient, ordering tests/procedures,documenting clinical information in the electronic health record, independently interpreting results and communicating results to the patient, and care coordination.  Ulysees Barns, MD Department of Hematology/Oncology [facility] Phone: [phone] Pager: [phone] Email: [email or web address]

## 2023-03-31 LAB — CHROMOGRANIN A: Chromogranin A (ng/mL): 64.1 ng/mL (ref 0.0–101.8)

## 2023-04-10 ENCOUNTER — Encounter: Payer: Self-pay | Admitting: Hematology and Oncology

## 2023-04-10 ENCOUNTER — Other Ambulatory Visit: Payer: Self-pay | Admitting: Hematology and Oncology

## 2023-04-10 DIAGNOSIS — C7A8 Other malignant neuroendocrine tumors: Secondary | ICD-10-CM

## 2023-04-17 DIAGNOSIS — D485 Neoplasm of uncertain behavior of skin: Secondary | ICD-10-CM | POA: Diagnosis not present

## 2023-04-17 DIAGNOSIS — D1801 Hemangioma of skin and subcutaneous tissue: Secondary | ICD-10-CM | POA: Diagnosis not present

## 2023-04-17 DIAGNOSIS — D2371 Other benign neoplasm of skin of right lower limb, including hip: Secondary | ICD-10-CM | POA: Diagnosis not present

## 2023-04-17 DIAGNOSIS — D2262 Melanocytic nevi of left upper limb, including shoulder: Secondary | ICD-10-CM | POA: Diagnosis not present

## 2023-04-17 DIAGNOSIS — L814 Other melanin hyperpigmentation: Secondary | ICD-10-CM | POA: Diagnosis not present

## 2023-04-17 DIAGNOSIS — D225 Melanocytic nevi of trunk: Secondary | ICD-10-CM | POA: Diagnosis not present

## 2023-04-17 DIAGNOSIS — L821 Other seborrheic keratosis: Secondary | ICD-10-CM | POA: Diagnosis not present

## 2023-04-24 ENCOUNTER — Other Ambulatory Visit: Payer: Self-pay | Admitting: Hematology and Oncology

## 2023-04-24 DIAGNOSIS — C7B8 Other secondary neuroendocrine tumors: Secondary | ICD-10-CM

## 2023-04-25 ENCOUNTER — Inpatient Hospital Stay: Payer: Medicare HMO

## 2023-04-25 ENCOUNTER — Inpatient Hospital Stay: Payer: Medicare HMO | Admitting: Hematology and Oncology

## 2023-04-25 VITALS — BP 152/74 | HR 63 | Temp 98.6°F | Resp 16

## 2023-04-25 DIAGNOSIS — C7A8 Other malignant neuroendocrine tumors: Secondary | ICD-10-CM

## 2023-04-25 LAB — CMP (CANCER CENTER ONLY)
ALT: 11 U/L (ref 0–44)
AST: 12 U/L — ABNORMAL LOW (ref 15–41)
Albumin: 4.3 g/dL (ref 3.5–5.0)
Alkaline Phosphatase: 63 U/L (ref 38–126)
Anion gap: 4 — ABNORMAL LOW (ref 5–15)
BUN: 10 mg/dL (ref 8–23)
CO2: 27 mmol/L (ref 22–32)
Calcium: 9.2 mg/dL (ref 8.9–10.3)
Chloride: 106 mmol/L (ref 98–111)
Creatinine: 1.27 mg/dL — ABNORMAL HIGH (ref 0.61–1.24)
GFR, Estimated: >60 mL/min (ref >60)
Glucose, Bld: 266 mg/dL — ABNORMAL HIGH (ref 70–99)
Potassium: 3.9 mmol/L (ref 3.5–5.1)
Sodium: 137 mmol/L (ref 135–145)
Total Bilirubin: 0.6 mg/dL (ref 0.3–1.2)
Total Protein: 6.5 g/dL (ref 6.5–8.1)

## 2023-04-25 LAB — CBC WITH DIFFERENTIAL (CANCER CENTER ONLY)
Abs Immature Granulocytes: 0.03 10*3/uL (ref 0.00–0.07)
Basophils Absolute: 0.1 10*3/uL (ref 0.0–0.1)
Basophils Relative: 1 %
Eosinophils Absolute: 0.5 10*3/uL (ref 0.0–0.5)
Eosinophils Relative: 5 %
HCT: 39.9 % (ref 39.0–52.0)
Hemoglobin: 14 g/dL (ref 13.0–17.0)
Immature Granulocytes: 0 %
Lymphocytes Relative: 7 %
Lymphs Abs: 0.6 10*3/uL — ABNORMAL LOW (ref 0.7–4.0)
MCH: 31 pg (ref 26.0–34.0)
MCHC: 35.1 g/dL (ref 30.0–36.0)
MCV: 88.5 fL (ref 80.0–100.0)
Monocytes Absolute: 0.8 10*3/uL (ref 0.1–1.0)
Monocytes Relative: 9 %
Neutro Abs: 7 10*3/uL (ref 1.7–7.7)
Neutrophils Relative %: 78 %
Platelet Count: 155 10*3/uL (ref 150–400)
RBC: 4.51 MIL/uL (ref 4.22–5.81)
RDW: 12.1 % (ref 11.5–15.5)
WBC Count: 9 10*3/uL (ref 4.0–10.5)
nRBC: 0 % (ref 0.0–0.2)

## 2023-04-25 MED ORDER — LANREOTIDE ACETATE 120 MG/0.5ML ~~LOC~~ SOLN
120.0000 mg | Freq: Once | SUBCUTANEOUS | Status: AC
  Administered 2023-04-25: 120 mg via SUBCUTANEOUS
  Filled 2023-04-25: qty 120

## 2023-04-26 LAB — CHROMOGRANIN A: Chromogranin A (ng/mL): 55.5 ng/mL (ref 0.0–101.8)

## 2023-05-23 ENCOUNTER — Inpatient Hospital Stay: Payer: Medicare HMO | Attending: Physician Assistant

## 2023-05-23 ENCOUNTER — Inpatient Hospital Stay: Payer: Medicare HMO | Admitting: Hematology and Oncology

## 2023-05-23 ENCOUNTER — Inpatient Hospital Stay: Payer: Medicare HMO

## 2023-05-23 VITALS — BP 134/66 | HR 65 | Temp 98.0°F | Resp 15 | Ht 70.0 in | Wt 191.8 lb

## 2023-05-23 DIAGNOSIS — C7A8 Other malignant neuroendocrine tumors: Secondary | ICD-10-CM | POA: Insufficient documentation

## 2023-05-23 DIAGNOSIS — C7B8 Other secondary neuroendocrine tumors: Secondary | ICD-10-CM

## 2023-05-23 LAB — CMP (CANCER CENTER ONLY)
ALT: 10 U/L (ref 0–44)
AST: 12 U/L — ABNORMAL LOW (ref 15–41)
Albumin: 4.2 g/dL (ref 3.5–5.0)
Alkaline Phosphatase: 55 U/L (ref 38–126)
Anion gap: 4 — ABNORMAL LOW (ref 5–15)
BUN: 14 mg/dL (ref 8–23)
CO2: 29 mmol/L (ref 22–32)
Calcium: 9.4 mg/dL (ref 8.9–10.3)
Chloride: 104 mmol/L (ref 98–111)
Creatinine: 1.31 mg/dL — ABNORMAL HIGH (ref 0.61–1.24)
GFR, Estimated: 58 mL/min — ABNORMAL LOW (ref >60)
Glucose, Bld: 185 mg/dL — ABNORMAL HIGH (ref 70–99)
Potassium: 4.5 mmol/L (ref 3.5–5.1)
Sodium: 137 mmol/L (ref 135–145)
Total Bilirubin: 0.7 mg/dL (ref <1.2)
Total Protein: 6.5 g/dL (ref 6.5–8.1)

## 2023-05-23 LAB — CBC WITH DIFFERENTIAL (CANCER CENTER ONLY)
Abs Immature Granulocytes: 0.02 10*3/uL (ref 0.00–0.07)
Basophils Absolute: 0.1 10*3/uL (ref 0.0–0.1)
Basophils Relative: 1 %
Eosinophils Absolute: 0.6 10*3/uL — ABNORMAL HIGH (ref 0.0–0.5)
Eosinophils Relative: 6 %
HCT: 40.8 % (ref 39.0–52.0)
Hemoglobin: 14.3 g/dL (ref 13.0–17.0)
Immature Granulocytes: 0 %
Lymphocytes Relative: 8 %
Lymphs Abs: 0.7 10*3/uL (ref 0.7–4.0)
MCH: 31 pg (ref 26.0–34.0)
MCHC: 35 g/dL (ref 30.0–36.0)
MCV: 88.3 fL (ref 80.0–100.0)
Monocytes Absolute: 0.9 10*3/uL (ref 0.1–1.0)
Monocytes Relative: 9 %
Neutro Abs: 7.3 10*3/uL (ref 1.7–7.7)
Neutrophils Relative %: 76 %
Platelet Count: 159 10*3/uL (ref 150–400)
RBC: 4.62 MIL/uL (ref 4.22–5.81)
RDW: 11.9 % (ref 11.5–15.5)
WBC Count: 9.6 10*3/uL (ref 4.0–10.5)
nRBC: 0 % (ref 0.0–0.2)

## 2023-05-23 MED ORDER — LANREOTIDE ACETATE 120 MG/0.5ML ~~LOC~~ SOLN
120.0000 mg | Freq: Once | SUBCUTANEOUS | Status: AC
Start: 2023-05-23 — End: 2023-05-23
  Administered 2023-05-23: 120 mg via SUBCUTANEOUS
  Filled 2023-05-23: qty 120

## 2023-05-23 NOTE — Progress Notes (Signed)
[facility] [phone]   [phone]  PROGRESS NOTE:  Patient Care Team: Ailene Ravel, MD as PCP - General (Family Medicine) Othella Boyer, MD as Attending Physician (Cardiology) Chipper Herb, MD (Inactive) as Attending Physician (Radiation Oncology) Barron Alvine, MD (Inactive) as Attending Physician (Urology)  CHIEF COMPLAINTS/PURPOSE OF CONSULTATION:  Right renal mass and peritoneal nodularity  ONCOLOGIC HISTORY: Presented to the [facility] ER on 12/11/2021 for painless hematuria and urinary urgency.  Work-up included urinalysis showed large leukocyte esterase, blood, protein, positive nitrates.  Urine culture grew E. coli.  Patient was treated with cefdinir.  He underwent CT imaging that showed a nonobstructing 6 mm stone in the interpolar left kidney, indeterminate 2.9 x 3.4 x 4.3 cm lesion in the upper pole right kidney, multiple peritoneal and omental nodules concerning for peritoneal carcinomatosis. 01/10/2022: CT chest: Incidental right lower lobe segmental and subsegmental pulmonary emboli.Minimal clot burden without right heart strain.No evidence of intrathoracic metastases.Small focus of ground-glass airspace disease within the right upper lobe likely inflammatory or infectious. Started on Eliquis therapy 01/18/2022: CT guided biopsy of peritoneal mass: Well-differentiated low-grade neuroendocrine neoplasm with features  suspicious carcinoid tumor. Ki-67 shows very low proliferative index score.  01/30/2022: PET DOTATATE showed extensive tracer avid peritoneal metastasis and tracer avid liver metastasis to the posterior right hepatic lobe. 02/01/2022: Start lanreotide monthly injections 6.   01/26/2023: CT renal biopsy showed low grade papillary carcinoma of the kidney. Urology opted for surveillance rather than surgery.   INTERIM HISTORY:  Daniel Gibson returns today for a follow up visit for a low grade neuroendocrine  carcinoma.   On exam today Daniel Gibson is accompanied by his wife and son.  He reports that he has had no major changes in his health in the interim since her last visit.  He reports his bowels are moving well and he is not having any nausea or vomiting with the shots.  His weight has been steadily increasing and he is up to 191 pounds up from 183 pounds on 03/01/2023.  He reports that his appetite has been strong.  He is not having any infectious symptoms such as runny nose, sore throat, cough.  He denies any fevers, chills, sweats.  He reports overall everything is been steady.  He is not having any issues with lanreotide shots such as injection site reactions, itching, redness, or rash.  He is not having any trouble with nausea, vomiting, or diarrhea.  Overall he is willing and able to proceed with lanreotide shots at this time.[name] He has no other complaints. Rest of the 10 point ROS is below.   MEDICAL HISTORY:  Past Medical History:  Diagnosis Date   Carotid artery disease (HCC) 05/08/2012   CEA on left 2013   Coronary artery disease    Diabetes mellitus without complication (HCC)    Food allergy    Alpha Gal allergy    GERD (gastroesophageal reflux disease)    Hyperlipidemia    Hypertensive heart disease without CHF 04/08/2018   Myocardial infarction [facility]) 1993   medically managed   Neuroendocrine carcinoma metastatic to liver (HCC)    Obesity (BMI 30-39.9) 04/08/2018   Prostate cancer (HCC) 08/07/2016   Pulmonary embolism (HCC)    PVC's (premature ventricular contractions) 04/08/2018   Thrombocytopenia (HCC)     SURGICAL HISTORY: Past Surgical History:  Procedure Laterality Date   APPENDECTOMY     CAROTID ENDARTERECTOMY Right 06-07-12   cea   CORONARY ARTERY BYPASS  GRAFT N/A 10/18/2020   Procedure: CORONARY ARTERY BYPASS GRAFTING (CABG)x3. LEFT INTERNAL MAMMARY ARTERY. RIGHT ENDOSCOPIC SAPHENOUS VEIN HARVESTING.;  Surgeon: Linden Dolin, MD;  Location: [facility] OR;  Service: Open  Heart Surgery;  Laterality: N/A;   CYSTOSCOPY WITH RETROGRADE PYELOGRAM, URETEROSCOPY AND STENT PLACEMENT Right 01/10/2023   Procedure: CYSTOSCOPY WITH RETROGRADE PYELOGRAM, RIGHT DIAGNOSTIC URETEROSCOPY;  Surgeon: Loletta Parish., MD;  Location: [facility] ORS;  Service: Urology;  Laterality: Right;   ENDARTERECTOMY  06/07/2012   Procedure: ENDARTERECTOMY CAROTID;  Surgeon: Chuck Hint, MD;  Location: [facility] OR;  Service: Vascular;  Laterality: Right;  Right Carotid Endarterectomy with Patch Angioplasty   LEFT HEART CATH AND CORONARY ANGIOGRAPHY N/A 10/07/2020   Procedure: LEFT HEART CATH AND CORONARY ANGIOGRAPHY;  Surgeon: Lennette Bihari, MD;  Location: [facility] INVASIVE CV LAB;  Service: Cardiovascular;  Laterality: N/A;   TEE WITHOUT CARDIOVERSION N/A 10/18/2020   Procedure: TRANSESOPHAGEAL ECHOCARDIOGRAM (TEE);  Surgeon: Linden Dolin, MD;  Location: [facility] OR;  Service: Open Heart Surgery;  Laterality: N/A;   TRANSPERINEAL IMPLANT OF RADIATION SEEDS W/ ULTRASOUND      SOCIAL HISTORY: Social History   Socioeconomic History   Marital status: Married    Spouse name: Not on file   Number of children: Not on file   Years of education: Not on file   Highest education level: Not on file  Occupational History   Not on file  Tobacco Use   Smoking status: Never   Smokeless tobacco: Never  Vaping Use   Vaping status: Never Used  Substance and Sexual Activity   Alcohol use: No   Drug use: No   Sexual activity: Not Currently  Other Topics Concern   Not on file  Social History Narrative   Not on file   Social Determinants of Health   Financial Resource Strain: Not on file  Food Insecurity: Not on file  Transportation Needs: Not on file  Physical Activity: Not on file  Stress: Not on file  Social Connections: Not on file  Intimate Partner Violence: Not on file    FAMILY HISTORY: Family History  Problem Relation Age of Onset   Cancer Mother    Hyperlipidemia Mother     Hypertension Mother    Heart attack Father    Heart disease Father        Heart Disease before age 11   Hyperlipidemia Father    Hypertension Father    Diabetes Sister    Heart disease Sister        before age 39   Hypertension Sister    Hyperlipidemia Sister    Diabetes Brother    Heart disease Brother        before age 16   Hyperlipidemia Brother    Hypertension Brother     ALLERGIES:  is allergic to ace inhibitors, albumin (human), beef-derived products, gelfoam-jmi sponge, heparin, pork-derived products, sheep-derived products, and tetanus toxoids.  MEDICATIONS:  Current Outpatient Medications  Medication Sig Dispense Refill   amLODipine (NORVASC) 10 MG tablet Take 10 mg by mouth in the morning.     apixaban (ELIQUIS) 5 MG TABS tablet Take 1 tablet (5 mg total) by mouth 2 (two) times daily. 60 tablet 5   carvedilol (COREG) 25 MG tablet Take 12.5 mg by mouth 2 (two) times daily with a meal.     EPINEPHrine (EPIPEN 2-PAK) 0.3 mg/0.3 mL IJ SOAJ injection Use as directed for life-threatening allergic reaction. 2 each 3   ezetimibe (ZETIA)  10 MG tablet TAKE 1 TABLET BY MOUTH EVERY DAY 90 tablet 1   fluticasone (FLONASE) 50 MCG/ACT nasal spray Place 2 sprays into both nostrils daily.     glimepiride (AMARYL) 4 MG tablet Take 4 mg by mouth daily with breakfast.     losartan (COZAAR) 25 MG tablet Take 25 mg by mouth daily.     metFORMIN (GLUCOPHAGE) 1000 MG tablet Take 1,000 mg by mouth 2 (two) times daily with a meal.     oxyCODONE-acetaminophen (PERCOCET) 5-325 MG tablet Take 1 tablet by mouth every 6 (six) hours as needed for severe pain or moderate pain (post-operatively). 10 tablet 0   pantoprazole (PROTONIX) 40 MG tablet Take 1 tablet (40 mg total) by mouth daily. 90 tablet 1   polyethylene glycol (MIRALAX / GLYCOLAX) 17 g packet Take 17 g by mouth daily as needed (constipation).     rosuvastatin (CRESTOR) 40 MG tablet Take 1 tablet (40 mg total) by mouth daily. 90 tablet 3    senna-docusate (SENOKOT-S) 8.6-50 MG tablet Take 1 tablet by mouth 2 (two) times daily. While taking stong pain meds to prevent constipaiton 10 tablet 0   Tamsulosin HCl (FLOMAX) 0.4 MG CAPS Take 0.8 mg by mouth at bedtime.     traZODone (DESYREL) 50 MG tablet Take 50 mg by mouth at bedtime as needed for sleep.     Trospium Chloride 60 MG CP24 Take 1 capsule by mouth daily.     No current facility-administered medications for this visit.    REVIEW OF SYSTEMS:   Constitutional: ( - ) fevers, ( - )  chills , ( - ) night sweats Eyes: ( - ) blurriness of vision, ( - ) double vision, ( - ) watery eyes Ears, nose, mouth, throat, and face: ( - ) mucositis, ( - ) sore throat Respiratory: ( - ) cough, ( - ) dyspnea, ( - ) wheezes Cardiovascular: ( - ) palpitation, ( - ) chest discomfort, ( - ) lower extremity swelling Gastrointestinal:  ( - ) nausea, ( - ) heartburn, ( - ) change in bowel habits Skin: ( - ) abnormal skin rashes Lymphatics: ( - ) new lymphadenopathy, ( - ) easy bruising Neurological: ( - ) numbness, ( - ) tingling, ( - ) new weaknesses Behavioral/Psych: ( - ) mood change, ( - ) new changes  All other systems were reviewed with the patient and are negative.  PHYSICAL EXAMINATION: ECOG PERFORMANCE STATUS: 0 - Asymptomatic  Vitals:   05/23/23 1523 05/23/23 1526  BP: (!) 143/77 134/66  Pulse: 65   Resp: 15   Temp: 98 F (36.7 C)   SpO2: 98%     Filed Weights   05/23/23 1523  Weight: 191 lb 12.8 oz (87 kg)     GENERAL: well appearing Caucasian male in NAD  SKIN: skin color, texture, turgor are normal, no rashes or significant lesions EYES: conjunctiva are pink and non-injected, sclera clear LUNGS: clear to auscultation and percussion with normal breathing effort HEART: regular rate & rhythm and no murmurs and no lower extremity edema Musculoskeletal: no cyanosis of digits and no clubbing  PSYCH: alert & oriented x 3, fluent speech NEURO: no focal motor/sensory  deficits  LABORATORY DATA:  I have reviewed the data as listed    Latest Ref Rng & Units 05/23/2023    2:39 PM 04/25/2023    2:25 PM 03/29/2023    2:09 PM  CBC  WBC 4.0 - 10.5 K/uL 9.6  9.0  8.3   Hemoglobin 13.0 - 17.0 g/dL 45.4  09.8  11.9   Hematocrit 39.0 - 52.0 % 40.8  39.9  40.2   Platelets 150 - 400 K/uL 159  155  147        Latest Ref Rng & Units 05/23/2023    2:39 PM 04/25/2023    2:25 PM 03/29/2023    2:09 PM  CMP  Glucose 70 - 99 mg/dL 147  829  562   BUN 8 - 23 mg/dL 14  10  14    Creatinine 0.61 - 1.24 mg/dL 1.30  8.65  7.84   Sodium 135 - 145 mmol/L 137  137  138   Potassium 3.5 - 5.1 mmol/L 4.5  3.9  4.2   Chloride 98 - 111 mmol/L 104  106  106   CO2 22 - 32 mmol/L 29  27  27    Calcium 8.9 - 10.3 mg/dL 9.4  9.2  9.4   Total Protein 6.5 - 8.1 g/dL 6.5  6.5  6.3   Total Bilirubin <1.2 mg/dL 0.7  0.6  0.7   Alkaline Phos 38 - 126 U/L 55  63  63   AST 15 - 41 U/L 12  12  12    ALT 0 - 44 U/L 10  11  10       RADIOGRAPHIC STUDIES: No results found.  ASSESSMENT & PLAN SHIGETO SCHRAM is a 72 y.o. male who presents for a follow up for newly diagnosed well differentiated neuroendocrine carcinoma, low grade.   #Well differentiated neuroendocrine carcinoma, low grade: --CT guided peritoneal mass biopsy on 01/18/2022 confirmed above diagnosis. --PET DOTATATE was completed on 01/30/2022, report pending --Recommend monthly lanreotide injection q 28 days. Reviewed dose, frequency and common side effects including injection site reaction, cholelithiasis, gallbladder sludge, hyperglycemia, hypoglycemia, thyroid dysfunction and bradycardia.  --Labs from today were reviewed and require no intervention. Baseline Chromogranin levels were 159.7 on 02/01/2022.  PLAN: --Continue with monthly lanreotide injection q 28 days. --CBC and CMP every month before treatment. Chromagranin levels every 3 months --labs today show white blood cell 9.6, hemoglobin 14.3, MCV 88.3, platelets 159  and AST and ALT were within normal limits. --chromogranin A last 64.1 on 03/29/2023.  --last CT scan on 03/08/2023 showed the hepatic lesions are similar but there is a increase in the splenic lesions and what appeared to be a mixed response to therapy with new left upper quadrant omental nodules, however an index gastrocolic ligament implant is no longer visible. --Urology following for renal mass, details noted below.  --RTC in 3 months for a toxicity evaluation and continued follow up. Inteval CT scan   # Papillary Carcinoma of the Kidney  --biopsy of renal mass performed on 01/26/2023, showed papillary renal cell carcinoma, solid type 1, nuclear grade 2  --urology declined surgery at this time in favor of surveillance  --continue to monitor with serial CT scans.   No orders of the defined types were placed in this encounter.   All questions were answered. The patient knows to call the clinic with any problems, questions or concerns.  I have spent a total of 30 minutes minutes of face-to-face and non-face-to-face time, preparing to see the patient,  performing a medically appropriate examination, counseling and educating the patient, ordering tests/procedures,documenting clinical information in the electronic health record, independently interpreting results and communicating results to the patient, and care coordination.  Ulysees Barns, MD Department of Hematology/Oncology [facility]  Phone: [phone] Pager: [phone] Email: [email or web address]

## 2023-05-24 LAB — CHROMOGRANIN A: Chromogranin A (ng/mL): 58.1 ng/mL (ref 0.0–101.8)

## 2023-06-13 ENCOUNTER — Ambulatory Visit (HOSPITAL_COMMUNITY)
Admission: RE | Admit: 2023-06-13 | Discharge: 2023-06-13 | Disposition: A | Discharge status: Home / Self Care | Payer: Medicare HMO | Source: Ambulatory Visit | Attending: Hematology and Oncology | Admitting: Hematology and Oncology

## 2023-06-13 DIAGNOSIS — C787 Secondary malignant neoplasm of liver and intrahepatic bile duct: Secondary | ICD-10-CM | POA: Diagnosis not present

## 2023-06-13 DIAGNOSIS — I700 Atherosclerosis of aorta: Secondary | ICD-10-CM | POA: Diagnosis not present

## 2023-06-13 DIAGNOSIS — D739 Disease of spleen, unspecified: Secondary | ICD-10-CM | POA: Diagnosis not present

## 2023-06-13 DIAGNOSIS — C7B8 Other secondary neuroendocrine tumors: Secondary | ICD-10-CM | POA: Insufficient documentation

## 2023-06-13 DIAGNOSIS — C7A8 Other malignant neuroendocrine tumors: Secondary | ICD-10-CM | POA: Insufficient documentation

## 2023-06-13 MED ORDER — IOHEXOL 300 MG/ML  SOLN
100.0000 mL | Freq: Once | INTRAMUSCULAR | Status: AC | PRN
  Administered 2023-06-13: 100 mL via INTRAVENOUS

## 2023-06-13 MED ORDER — SODIUM CHLORIDE (PF) 0.9 % IJ SOLN
INTRAMUSCULAR | Status: AC
  Filled 2023-06-13: qty 50

## 2023-06-18 ENCOUNTER — Encounter: Payer: Self-pay | Admitting: Hematology and Oncology

## 2023-06-21 ENCOUNTER — Inpatient Hospital Stay: Payer: Medicare HMO | Attending: Physician Assistant

## 2023-06-21 ENCOUNTER — Inpatient Hospital Stay: Payer: Medicare HMO

## 2023-06-21 VITALS — BP 143/65 | HR 60 | Temp 97.6°F | Resp 16

## 2023-06-21 DIAGNOSIS — C7A8 Other malignant neuroendocrine tumors: Secondary | ICD-10-CM | POA: Diagnosis present

## 2023-06-21 DIAGNOSIS — C7B8 Other secondary neuroendocrine tumors: Secondary | ICD-10-CM | POA: Insufficient documentation

## 2023-06-21 LAB — CMP (CANCER CENTER ONLY)
ALT: 11 U/L (ref 0–44)
AST: 14 U/L — ABNORMAL LOW (ref 15–41)
Albumin: 4 g/dL (ref 3.5–5.0)
Alkaline Phosphatase: 47 U/L (ref 38–126)
Anion gap: 6 (ref 5–15)
BUN: 17 mg/dL (ref 8–23)
CO2: 26 mmol/L (ref 22–32)
Calcium: 8.9 mg/dL (ref 8.9–10.3)
Chloride: 107 mmol/L (ref 98–111)
Creatinine: 1.2 mg/dL (ref 0.61–1.24)
GFR, Estimated: >60 mL/min (ref >60)
Glucose, Bld: 229 mg/dL — ABNORMAL HIGH (ref 70–99)
Potassium: 3.3 mmol/L — ABNORMAL LOW (ref 3.5–5.1)
Sodium: 139 mmol/L (ref 135–145)
Total Bilirubin: 0.6 mg/dL (ref <1.2)
Total Protein: 6.1 g/dL — ABNORMAL LOW (ref 6.5–8.1)

## 2023-06-21 LAB — CBC WITH DIFFERENTIAL (CANCER CENTER ONLY)
Abs Immature Granulocytes: 0.02 10*3/uL (ref 0.00–0.07)
Basophils Absolute: 0.1 10*3/uL (ref 0.0–0.1)
Basophils Relative: 1 %
Eosinophils Absolute: 0.9 10*3/uL — ABNORMAL HIGH (ref 0.0–0.5)
Eosinophils Relative: 9 %
HCT: 38.2 % — ABNORMAL LOW (ref 39.0–52.0)
Hemoglobin: 13.9 g/dL (ref 13.0–17.0)
Immature Granulocytes: 0 %
Lymphocytes Relative: 8 %
Lymphs Abs: 0.7 10*3/uL (ref 0.7–4.0)
MCH: 31.2 pg (ref 26.0–34.0)
MCHC: 36.4 g/dL — ABNORMAL HIGH (ref 30.0–36.0)
MCV: 85.8 fL (ref 80.0–100.0)
Monocytes Absolute: 0.8 10*3/uL (ref 0.1–1.0)
Monocytes Relative: 8 %
Neutro Abs: 6.7 10*3/uL (ref 1.7–7.7)
Neutrophils Relative %: 74 %
Platelet Count: 167 10*3/uL (ref 150–400)
RBC: 4.45 MIL/uL (ref 4.22–5.81)
RDW: 12.2 % (ref 11.5–15.5)
WBC Count: 9.1 10*3/uL (ref 4.0–10.5)
nRBC: 0 % (ref 0.0–0.2)

## 2023-06-21 MED ORDER — LANREOTIDE ACETATE 120 MG/0.5ML ~~LOC~~ SOLN
120.0000 mg | Freq: Once | SUBCUTANEOUS | Status: AC
Start: 2023-06-21 — End: 2023-06-21
  Administered 2023-06-21: 120 mg via SUBCUTANEOUS
  Filled 2023-06-21: qty 120

## 2023-06-22 ENCOUNTER — Telehealth: Payer: Self-pay | Admitting: *Deleted

## 2023-06-22 LAB — CHROMOGRANIN A: Chromogranin A (ng/mL): 65.7 ng/mL (ref 0.0–101.8)

## 2023-06-22 NOTE — Telephone Encounter (Signed)
TCT patient regarding recent scan results.  Spoke with him.advised that his CT scan appears stable. There is no progression of disease and no new areas of disease. Overall this is a good result and shows that his treatment is working well to prevent his cancer from growing or spreading. We will plan to continue to see him as scheduled. Pt voiced understanding and is aware of his upcoming appts.

## 2023-06-22 NOTE — Telephone Encounter (Signed)
-----   Message from Daniel Gibson sent at 06/21/2023 11:01 AM EST ----- Please let Daniel Gibson know that his CT scan appears stable.  There is no progression of disease and no new areas of disease.  Overall this is a good result and shows that his treatment is working well to prevent his cancer from growing or spreading.  We will plan to continue to see him as scheduled. ----- Message ----- From: Leory Plowman, Rad Results In Sent: 06/18/2023  10:29 AM EST To: Jaci Standard, MD

## 2023-06-30 DIAGNOSIS — R079 Chest pain, unspecified: Secondary | ICD-10-CM | POA: Diagnosis not present

## 2023-06-30 DIAGNOSIS — R002 Palpitations: Secondary | ICD-10-CM | POA: Diagnosis not present

## 2023-06-30 DIAGNOSIS — R0789 Other chest pain: Secondary | ICD-10-CM | POA: Diagnosis not present

## 2023-06-30 DIAGNOSIS — I4891 Unspecified atrial fibrillation: Secondary | ICD-10-CM | POA: Diagnosis not present

## 2023-06-30 DIAGNOSIS — R110 Nausea: Secondary | ICD-10-CM | POA: Diagnosis not present

## 2023-06-30 DIAGNOSIS — R059 Cough, unspecified: Secondary | ICD-10-CM | POA: Diagnosis not present

## 2023-06-30 NOTE — ED Provider Notes (Signed)
 EMERGENCY MEDICINE  CHIEF COMPLAINT: Chief Complaint  Patient presents with  . Chest Pain   HPI: Daniel Gibson is a 73 y.o. male with pmh of MI s/p CABG in 2022, CAD, prostate cancer, neuroendocrine carcinoma metastatic to multiple sites, T2DM, and HTN; presenting with intermittent palpitations, which began this morning at 2 AM. He states when he got up, he felt his heart flutter, and this would resolve with laying back down. He also endorses nausea and cough. At the time of examination he denies any chest pain or palpitations. Patient has taken his BP medication this morning. He take Eliquis  daily. He has not taken Aspirin  today. Patient receives cancer treatment at [facility]. He endorses history of heart attack but states his current symptoms are dissimilar. He denies history of a-fib.  PAST MEDICAL HISTORY: Past Medical History:  Diagnosis Date  . Cancer (CMS-HCC)    prostate  . Coronary artery disease   . Diabetes mellitus (CMS-HCC)   . Hypertension   . Myocardial infarction (CMS-HCC)    PAST SURGICAL HISTORY:  has a past surgical history that includes Carotid endarterectomy; Skin tag removal; and Coronary artery bypass graft.  MEDICATIONS: Prior to Admission medications   Medication Sig Start Date End Date Taking? Authorizing Provider  amLODIPine  (NORVASC ) 10 MG tablet Take 1 tablet (10 mg total) by mouth daily.    [provider]  apixaban  (ELIQUIS ) 5 mg Tab Take by mouth Two (2) times a day.    [provider]  carvedilol  (COREG ) 25 MG tablet Take 0.5 tablets (12.5 mg total) by mouth Two (2) times a day.    [provider]  clopidogreL  (PLAVIX ) 75 mg tablet Take 1 tablet (75 mg total) by mouth daily. 11/22/21   [provider]  EPINEPHrine  (EPIPEN ) 0.3 mg/0.3 mL injection Inject 0.3 mL (0.3 mg total) into the muscle once. 11/08/21   [provider]  ezetimibe  (ZETIA ) 10 mg tablet Take 1 tablet (10 mg total) by mouth daily.  11/22/21   [provider]  lanreotide acetate  (LANREOTIDE SUBQ) Inject under the skin.    [provider]  metFORMIN  (GLUCOPHAGE ) 1000 MG tablet Take 1 tablet (1,000 mg total) by mouth in the morning and 1 tablet (1,000 mg total) in the evening. Take with meals.    [provider]  oxybutynin  (DITROPAN ) 5 MG tablet Take 1 tablet (5 mg total) by mouth Two (2) times a day. 12/30/21 12/30/22  Isaiah Lynwood Simpers, MD  pantoprazole  (PROTONIX ) 40 MG tablet Take 1 tablet (40 mg total) by mouth daily. 11/14/21   [provider]  rosuvastatin  (CRESTOR ) 40 MG tablet Take 1 tablet (40 mg total) by mouth daily. 09/26/21   [provider]  tamsulosin  (FLOMAX ) 0.4 mg capsule Take 2 capsules (0.8 mg total) by mouth every evening.    [provider]  traZODone  (DESYREL ) 50 MG tablet Take 1 tablet (50 mg total) by mouth nightly. 12/05/21   [provider]   ALLERGIES: Allergies  Allergen Reactions  . Heparin      Potential interaction with alpha-gal allergy  . Tetanus Vaccines And Toxoid Swelling  . Gelatin Other (See Comments)    Alpha-Gal  . Other Other (See Comments)    Sheep Products ; Alpha-Gal  . Pork Derived (Porcine) Other (See Comments)    Alpha-Gal  . Beef Containing Products Other (See Comments)    Alpha-Gal   FAMILY HISTORY: Family History  Problem Relation Age of Onset  . Cancer Mother   .  Heart disease Father   . Stroke Father   . Heart disease Brother   . Diabetes Brother   . Heart disease Daughter    SOCIAL HISTORY: Social History   Tobacco Use  . Smoking status: Never    Passive exposure: Never  . Smokeless tobacco: Never  Substance Use Topics  . Alcohol use: No   REVIEW OF SYSTEMS:  Constitutional: Negative for weight loss.   HENT: Negative for nose bleeds.   Eyes: Negative for discharge.   Respiratory: Negative for stridor.   Cardiovascular: + for palpitations.   Gastrointestinal: Negative for blood in stool.   Endocrine: Negative hot flashes.   Genitourinary: Negative for discharge.   Skin: Negative for rash.   Psychiatric/Behavioral: Negative.   All other systems reviewed and are negative.  PHYSICAL EXAM: Vitals:   06/30/23 1355 06/30/23 1543  BP: 208/90 170/70  Pulse: 73 66  Resp: 18 19  Temp: 36.9 C (98.5 F) 36.9 C (98.5 F)  TempSrc: Temporal Temporal  SpO2: 98% 96%  Weight: 81.6 kg (180 lb)   Height: 177.8 cm (5' 10)    Constitutional: Alert and oriented. Eyes: Conjunctivae are normal. ENT      Head: Normocephalic and atraumatic.      Nose: No congestion.      Mouth/Throat: Mucous membranes are moist.      Neck: No stridor. Cardiovascular: Irregular rate, regular rhythm. Extremities warm. Respiratory: Normal respiratory effort, breathing comfortably on room air. CTAB Gastrointestinal: Soft, nontender and nondistended.  Musculoskeletal: Nontender and non-edematous with equal range of motion in all extremities. Neurologic: Normal speech and language. No gross focal neurologic deficits are appreciated. Skin: Skin is warm, dry and intact. No rash noted on visible skin. Psychiatric: Mood and affect are normal.  DIAGNOSTICS:   Imaging: XR Chest 2 views  Final Result  No radiographic evidence of acute cardiopulmonary process.     Labs: Labs Reviewed  COMPREHENSIVE METABOLIC PANEL - Abnormal; Notable for the following components:      Result Value   Glucose 244 (*)    All other components within normal limits  PROTIME-INR - Abnormal; Notable for the following components:   PT 15.9 (*)    All other components within normal limits   Narrative:    Reference Interval is for non-anticoagulated patients. Suggested INR Therapeutic Ranges: Moderate Intensity Therapy   2.0 - 3.0 High Intensity Therapy     2.5 - 3.5  CBC W/ AUTO DIFF - Abnormal; Notable for the following components:   WBC 11.4 (*)    Absolute Neutrophils 9.4 (*)    Absolute Lymphocytes 0.7 (*)    All  other components within normal limits  TROPONIN I - Normal  CBC W/ DIFFERENTIAL   Narrative:    The following orders were created for panel order CBC w/ Differential. Procedure                               Abnormality         Status                    ---------                               -----------         ------  CBC w/ [id number]]         Abnormal            Final result               Please view results for these tests on the individual orders.    Orders Placed This Encounter  Procedures  . XR Chest 2 views  . CBC w/ Differential  . Comprehensive Metabolic Panel  . Troponin I  . PT-INR  . ECG 12 Lead  . ECG 12 Lead   Medications  aspirin  chewable tablet 324 mg (324 mg Oral Given 06/30/23 1526)    ED COURSE AND MEDICAL DECISION MAKING:  Daniel Gibson is a 73 y.o. male with pmh of MI s/p CABG in 2022, CAD, prostate cancer, neuroendocrine carcinoma metastatic to multiple sites, T2DM, and HTN; presenting with intermittent palpitations, which began this morning at 2 AM. Vitals significant for severe range hypertension, but improved on repeat without intervention, strong suspicion that stress of the emergency department is playing a role, given it is downtrending, will not give antihypertensive medication at this time.  A-fib noted on ECG, this is a new finding for patient, most likely explanation of patient's flutter sensation.  He is already on Eliquis , no additional anticoagulation needed at this time.  Patient was given aspirin  given he was a chest pain presentation to the emergency department, but low suspicion for ACS, MI. Troponin wnl. ECG negative for STEMI. Heart score 4 (age, risk factors).  Recommend close follow-up with PCP.  Ddx considered: ACS, PNA, and pneumothorax. Low suspicion for tamponade, aortic dissection, PE given history and clinical gestalt. I have considered PERC and Well's criteria.  No risk factors such as recent endoscopy  or frequent heavy retching to suggest Borhaave Syndrome.   Given the patient is at moderate risk by the Heart Score, we have discussed the possibility of being admitted to the hospital for further testing and observation, which I have offered to the patient. Through shared decision making, the patient has chosen to follow-up as an outpatient. Chest pain return precautions reviewed.  Patient demonstrates decision making capacity and has verbalized an understanding of the risks. The patient states understanding and agreement with this plan.  Progress Notes: ED Course as of 06/30/23 1806  Sat Jun 30, 2023  1443 On my independent interpretation, chest x-ray is overall normal appearing, with normal cardiac silhouette, no focal consolidations or increased opacities, overall lung spaces without acute changes.  1500 Patient and family at bedside (both sons and wife) updated on all results and most likely diagnosis of A-fib.  Can follow-up with cardiology.  Laboratory tests and radiology reports that were available during my shift were reviewed and considered in the medical decision making. I discussed the medical work-up findings, my rationale behind my medical decision making and my treatment plan with the patient who understands and is comfortable with the plan of care. Reports understanding of diagnosis, discharge instructions, and return precautions specific to the symptoms and diagnosis. Questions answered. Patient has received maximum benefit of ED visit today and is safe for discharge home.   FINAL IMPRESSION: Final diagnoses:  Chest pain, unspecified type (Primary)  Atrial fibrillation, unspecified type (CMS-HCC)   Rx:  Discharge Medication List as of 06/30/2023  3:31 PM      __________________________________________  Portions of this record have been created using Dragon dictation software. Dictation errors have been sought, but may not have been identified and corrected.  The chart was  modified by Eda Cree using scribe services on June 30, 2023 3:00 PM, on behalf of Jillian Merica, MD.  Documentation assistance was provided by the scribe in my presence. The documentation recorded by the scribe has been reviewed by me, amended with [employer], and accurately reflects the services I personally performed.    Merica, Jillian N, MD 06/30/23 [phone]

## 2023-07-02 DIAGNOSIS — R079 Chest pain, unspecified: Secondary | ICD-10-CM | POA: Diagnosis not present

## 2023-07-17 ENCOUNTER — Ambulatory Visit: Payer: Medicare HMO | Attending: Cardiology | Admitting: Cardiology

## 2023-07-17 ENCOUNTER — Encounter: Payer: Self-pay | Admitting: Cardiology

## 2023-07-17 VITALS — BP 162/76 | HR 69 | Ht 70.0 in | Wt 195.0 lb

## 2023-07-17 DIAGNOSIS — Z7901 Long term (current) use of anticoagulants: Secondary | ICD-10-CM

## 2023-07-17 DIAGNOSIS — R002 Palpitations: Secondary | ICD-10-CM | POA: Diagnosis not present

## 2023-07-17 DIAGNOSIS — I119 Hypertensive heart disease without heart failure: Secondary | ICD-10-CM | POA: Diagnosis not present

## 2023-07-17 DIAGNOSIS — I493 Ventricular premature depolarization: Secondary | ICD-10-CM

## 2023-07-17 DIAGNOSIS — K219 Gastro-esophageal reflux disease without esophagitis: Secondary | ICD-10-CM | POA: Insufficient documentation

## 2023-07-17 DIAGNOSIS — E785 Hyperlipidemia, unspecified: Secondary | ICD-10-CM

## 2023-07-17 DIAGNOSIS — I25119 Atherosclerotic heart disease of native coronary artery with unspecified angina pectoris: Secondary | ICD-10-CM | POA: Diagnosis not present

## 2023-07-17 MED ORDER — VALSARTAN 160 MG PO TABS: 160.0000 mg | ORAL_TABLET | Freq: Every day | ORAL | 3 refills | Status: DC

## 2023-07-17 NOTE — Patient Instructions (Signed)
Medication Instructions:  Your physician has recommended you make the following change in your medication:   STOP: Losartan START: Valsartan 160 mg daily  *If you need a refill on your cardiac medications before your next appointment, please call your pharmacy*   Lab Work: None If you have labs (blood work) drawn today and your tests are completely normal, you will receive your results only by: MyChart Message (if you have MyChart) OR A paper copy in the mail If you have any lab test that is abnormal or we need to change your treatment, we will call you to review the results.   Testing/Procedures: None   Follow-Up: At [facility], you and your health needs are our priority.  As part of our continuing mission to provide you with exceptional heart care, we have created designated Provider Care Teams.  These Care Teams include your primary Cardiologist (physician) and Advanced Practice Providers (APPs -  Physician Assistants and Nurse Practitioners) who all work together to provide you with the care you need, when you need it.  We recommend signing up for the patient portal called "MyChart".  Sign up information is provided on this After Visit Summary.  MyChart is used to connect with patients for Virtual Visits (Telemedicine).  Patients are able to view lab/test results, encounter notes, upcoming appointments, etc.  Non-urgent messages can be sent to your provider as well.   To learn more about what you can do with MyChart, go to [email or web address].    Your next appointment:   1 year(s)  Provider:   Norman Herrlich, MD    Other Instructions None

## 2023-07-17 NOTE — Progress Notes (Signed)
Cardiology Office Note:    Date:  07/17/2023   ID:  Daniel Gibson, DOB 1950/09/21, MRN [id number]  PCP:  Ailene Ravel, MD  Cardiologist:  Norman Herrlich, MD    Referring MD: Ailene Ravel, MD    ASSESSMENT:    1. Palpitations   2. Coronary artery disease involving native heart with angina pectoris, unspecified vessel or lesion type (HCC)   3. Hypertensive heart disease without CHF   4. Hyperlipidemia, unspecified hyperlipidemia type   5. Chronic anticoagulation   6. PVC's (premature ventricular contractions)    PLAN:    In order of problems listed above:  He had palpitation was found to have APCs in the emergency room I reviewed the records with him and did not have documented atrial fibrillation Symptoms have improved he declines an event monitor at this time He will continue his anticoagulant beta-blocker and I asked him to try to be better taking his evening dose Blood pressure is not at target and we will switch losartan to valsartan a much more potent antihypertensive agent here record trend at home and contact me if he is not at target With CABG he will continue his current lipid-lowering medication Continue his long-term anticoagulation with pulmonary embolism and malignancy   Next appointment: I will see him in 1 year in cardiology follow-up   Medication Adjustments/Labs and Tests Ordered: Current medicines are reviewed at length with the patient today.  Concerns regarding medicines are outlined above.  No orders of the defined types were placed in this encounter.  No orders of the defined types were placed in this encounter.    History of Present Illness:    Daniel Gibson is a 73 y.o. male with a hx of CAD with CABG 10/18/2020 pulmonary embolism with anticoagulation neuroendocrine carcinoma carcinoma hypertension hyperlipidemia and a history of PVCs.  He was last seen 02/14/2023 with a new diagnosis of renal cell cancer.  He is followed by oncology  for his well-differentiated neuroendocrine carcinoma and papillary carcinoma of the kidney with surveillance.;.  Compliance with diet, lifestyle and medications: Yes  He has worked into my office hours this afternoon following an emergency room visit [employer] [place].  He was seen in the ED 07/10/2023 with palpitation.  He is also having nausea and cough.  In the emergency room blood pressure was elevated initially 208/90 subsequently 170/70 pulse was 73 and 66 bpm.  Laboratory studies in the emergency room showed a normal troponin he had a CBC and a CMP performed chest x-ray unremarkable.  Hemoglobin is 15.1 platelets 177,000 troponin was undetectable sodium 135 potassium 3.7.  Chest x-ray showed post CABG findings otherwise normal I cannot see the EKG but is described as sinus rhythm with APCs and nonspecific T waves.  He was given an EKG by the ED physician hand carry he had the impression that it was atrial fibrillation at sinus rhythm with APCs Went over his records a second time he did not have documented atrial fibrillation He has been under a great deal of stress at home with the illness of her brother I offered to place an event monitor and he declined and told me he would let me know if he wanted me to do it His blood pressure is not well-controlled and I will switch him from losartan to valsartan a much more potent effect of antihypertensive agent he will record trend his blood pressures and if not at target we will send me an email  MyChart message Acknowledges perhaps every other day missing the p.m. dose of carvedilol with the demands of caring for his brother's illness His symptoms of palpitation have resolved He has not had angina edema shortness of breath or syncope Past Medical History:  Diagnosis Date   Abnormal cardiac CT angiography    Allergy to alpha-gal 12/27/2021   CAD (coronary artery disease), native coronary artery 04/08/2018   Cath 10/1991 in setting of inferior  MI  [facility]      normal Left main, scattered irregularities LAD, 40% stenosis proximal Ramus, occluded OM 2, 60% stenosis proximal PDA, dominant CFX, small and nondominant RCA     Carotid artery disease (HCC) 05/08/2012   CEA on left 2013   Coronary artery disease    Diabetes mellitus type 2, noninsulin dependent (HCC) 04/08/2018   Diabetes mellitus without complication (HCC)    Food allergy    Alpha Gal allergy    GERD (gastroesophageal reflux disease)    Hyperlipidemia    Hypertensive heart disease without CHF 04/08/2018   Myocardial infarction (HCC) 1993   medically managed   Neuroendocrine carcinoma metastatic to liver [facility])    Neuroendocrine carcinoma metastatic to multiple sites (HCC) 01/26/2022   Obesity (BMI 30-39.9) 04/08/2018   Old myocardial infarct 04/08/2018   Peritoneal lesion 01/04/2022   Prostate cancer (HCC) 08/07/2016   Pulmonary embolism (HCC)    PVC's (premature ventricular contractions) 04/08/2018   Pyelonephritis 12/27/2021   Renal calculus, left 12/27/2021   S/P CABG x 3 10/18/2020   Thrombocytopenia (HCC)     Current Medications: Current Meds  Medication Sig   amLODipine (NORVASC) 10 MG tablet Take 10 mg by mouth in the morning.   apixaban (ELIQUIS) 5 MG TABS tablet Take 1 tablet (5 mg total) by mouth 2 (two) times daily.   carvedilol (COREG) 25 MG tablet Take 12.5 mg by mouth 2 (two) times daily with a meal.   EPINEPHrine (EPIPEN 2-PAK) 0.3 mg/0.3 mL IJ SOAJ injection Use as directed for life-threatening allergic reaction.   ezetimibe (ZETIA) 10 MG tablet TAKE 1 TABLET BY MOUTH EVERY DAY   fluticasone (FLONASE) 50 MCG/ACT nasal spray Place 2 sprays into both nostrils daily.   glimepiride (AMARYL) 4 MG tablet Take 4 mg by mouth daily with breakfast.   losartan (COZAAR) 25 MG tablet Take 25 mg by mouth daily.   metFORMIN (GLUCOPHAGE) 1000 MG tablet Take 1,000 mg by mouth 2 (two) times daily with a meal.   oxyCODONE-acetaminophen (PERCOCET) 5-325 MG  tablet Take 1 tablet by mouth every 6 (six) hours as needed for severe pain or moderate pain (post-operatively).   pantoprazole (PROTONIX) 40 MG tablet Take 1 tablet (40 mg total) by mouth daily.   polyethylene glycol (MIRALAX / GLYCOLAX) 17 g packet Take 17 g by mouth daily as needed (constipation).   rosuvastatin (CRESTOR) 40 MG tablet Take 1 tablet (40 mg total) by mouth daily.   senna-docusate (SENOKOT-S) 8.6-50 MG tablet Take 1 tablet by mouth 2 (two) times daily. While taking stong pain meds to prevent constipaiton   Tamsulosin HCl (FLOMAX) 0.4 MG CAPS Take 0.8 mg by mouth at bedtime.   traZODone (DESYREL) 50 MG tablet Take 50 mg by mouth at bedtime as needed for sleep.   Trospium Chloride 60 MG CP24 Take 1 capsule by mouth daily.      EKGs/Labs/Other Studies Reviewed:    The following studies were reviewed today:  Cardiac Studies & Procedures   CARDIAC CATHETERIZATION  CARDIAC CATHETERIZATION 10/07/2020  Narrative  Prox RCA to Mid RCA lesion is 80% stenosed.  Ramus-1 lesion is 50% stenosed.  Ramus-2 lesion is 70% stenosed.  1st Mrg lesion is 90% stenosed.  Ost Cx to Prox Cx lesion is 80% stenosed.  Prox LAD lesion is 30% stenosed.  Mid LAD-1 lesion is 70% stenosed.  Mid LAD-2 lesion is 50% stenosed.  The left ventricular ejection fraction is 50-55% by visual estimate.  LV end diastolic pressure is normal.  Significant multivessel coronary calcification and multivessel CAD with calcification of the left main coronary artery, 30% proximal and 70% and 50% mid stenoses; 50% proximal ramus stenosis with 70% mid stenosis; long diffuse high marginal/ramus intermediate like vessel with 90% stenosis, 80% calcified ostial proximal circumflex stenosis with total occlusion of the OM 2 vessel with collateralization, and diffusely diseased 80% small RCA vessel with superior takeoff.  Low normal global LV function with EF estimated 50 to 55% with a subtle area of focal distal  inferior hypocontractility.  LVEDP 11 mmHg.  RECOMMENDATION: Recommend surgical consultation for CABG revascularization in this diabetic male with diffuse multivessel stenoses.  Aggressive lipid-lowering therapy with LDL less than 70.  If he is unable to achieve target, recommend PCSK9 inhibition.  The patient will be discharged later today on maximal medical therapy with plan for outpatient surgical consultation for CABG revascularization.  Findings Coronary Findings Diagnostic  Dominance: Co-dominant  Left Anterior Descending Prox LAD lesion is 30% stenosed. Mid LAD-1 lesion is 70% stenosed. Mid LAD-2 lesion is 50% stenosed.  Ramus Intermedius Ramus-1 lesion is 50% stenosed. Ramus-2 lesion is 70% stenosed.  Left Circumflex Ost Cx to Prox Cx lesion is 80% stenosed.  First Obtuse Marginal Branch 1st Mrg lesion is 90% stenosed.  Right Coronary Artery Vessel is small. Prox RCA to Mid RCA lesion is 80% stenosed.  Intervention  No interventions have been documented.    ECHOCARDIOGRAM  ECHOCARDIOGRAM COMPLETE 10/15/2020  Narrative ECHOCARDIOGRAM REPORT    Patient Name:   Daniel Gibson Date of Exam: 10/15/2020 Medical Rec #:  [id number]         Height:       70.0 in Accession #:    [id number]        Weight:       206.4 lb Date of Birth:  1950-12-27          BSA:          2.115 m Patient Age:    70 years          BP:           132/77 mmHg Patient Gender: M                 HR:           68 bpm. Exam Location:  Outpatient  Procedure: 2D Echo, Cardiac Doppler and Color Doppler  Indications:    CAD  History:        Patient has no prior history of Echocardiogram examinations. CAD and Previous Myocardial Infarction, Carotid Disease; Risk Factors:Hypertension and Dyslipidemia.  Sonographer:    Lavenia Atlas Referring Phys: [id number] BROADUS Z ATKINS  IMPRESSIONS   1. Left ventricular ejection fraction, by estimation, is 55 to 60%. The left ventricle has normal  function. The left ventricle has no regional wall motion abnormalities. Left ventricular diastolic parameters were normal. 2. Right ventricular systolic function is normal. The right ventricular size is normal. There is normal pulmonary artery systolic pressure. 3. The mitral valve is normal  in structure. Trivial mitral valve regurgitation. 4. The aortic valve is normal in structure. Aortic valve regurgitation is not visualized.  FINDINGS Left Ventricle: Left ventricular ejection fraction, by estimation, is 55 to 60%. The left ventricle has normal function. The left ventricle has no regional wall motion abnormalities. The left ventricular internal cavity size was normal in size. There is no left ventricular hypertrophy. Left ventricular diastolic parameters were normal.  Right Ventricle: The right ventricular size is normal. No increase in right ventricular wall thickness. Right ventricular systolic function is normal. There is normal pulmonary artery systolic pressure. The tricuspid regurgitant velocity is 2.28 m/s, and with an assumed right atrial pressure of 3 mmHg, the estimated right ventricular systolic pressure is 23.8 mmHg.  Left Atrium: Left atrial size was normal in size.  Right Atrium: Right atrial size was normal in size.  Pericardium: There is no evidence of pericardial effusion.  Mitral Valve: The mitral valve is normal in structure. Trivial mitral valve regurgitation.  Tricuspid Valve: The tricuspid valve is grossly normal. Tricuspid valve regurgitation is trivial.  Aortic Valve: The aortic valve is normal in structure. Aortic valve regurgitation is not visualized.  Pulmonic Valve: The pulmonic valve was grossly normal. Pulmonic valve regurgitation is not visualized.  Aorta: The aortic root and ascending aorta are structurally normal, with no evidence of dilitation.  IAS/Shunts: The atrial septum is grossly normal.   LEFT VENTRICLE PLAX 2D LVIDd:         4.90 cm      Diastology LVIDs:         3.60 cm     LV e' medial:    5.00 cm/s LV PW:         1.10 cm     LV E/e' medial:  15.7 LV IVS:        0.90 cm     LV e' lateral:   7.51 cm/s LVOT diam:     2.00 cm     LV E/e' lateral: 10.4 LV SV:         63 LV SV Index:   30 LVOT Area:     3.14 cm  LV Volumes (MOD) LV vol d, MOD A4C: 88.3 ml LV vol s, MOD A4C: 45.1 ml LV SV MOD A4C:     88.3 ml  RIGHT VENTRICLE RV Basal diam:  2.90 cm RV S prime:     14.90 cm/s TAPSE (M-mode): 2.8 cm  LEFT ATRIUM             Index       RIGHT ATRIUM           Index LA diam:        4.10 cm 1.94 cm/m  RA Area:     13.60 cm LA Vol (A2C):   49.1 ml 23.21 ml/m RA Volume:   33.40 ml  15.79 ml/m LA Vol (A4C):   40.9 ml 19.33 ml/m LA Biplane Vol: 46.5 ml 21.98 ml/m AORTIC VALVE LVOT Vmax:   92.70 cm/s LVOT Vmean:  62.800 cm/s LVOT VTI:    0.202 m  AORTA Ao Root diam: 2.70 cm  MITRAL VALVE               TRICUSPID VALVE MV Area (PHT): 3.21 cm    TR Peak grad:   20.8 mmHg MV Decel Time: 236 msec    TR Vmax:        228.00 cm/s MV E velocity: 78.40 cm/s MV A velocity: 73.70 cm/s  SHUNTS MV E/A ratio:  1.06        Systemic VTI:  0.20 m Systemic Diam: 2.00 cm  Kristeen Miss MD Electronically signed by Kristeen Miss MD Signature Date/Time: 10/15/2020/11:13:52 AM    Final  TEE  ECHO INTRAOPERATIVE TEE 10/18/2020  Narrative *INTRAOPERATIVE TRANSESOPHAGEAL REPORT *    Patient Name:   Daniel Gibson Date of Exam: 10/18/2020 Medical Rec #:  [id number]         Height:       70.0 in Accession #:    [id number]        Weight:       206.3 lb Date of Birth:  1950-09-27          BSA:          2.12 m Patient Age:    70 years          BP:           150/68 mmHg Patient Gender: M                 HR:           63 bpm. Exam Location:  Anesthesiology  Transesophogeal exam was perform intraoperatively during surgical procedure. Patient was closely monitored under general anesthesia during the entirety  of examination.  Indications:     CAD Native Vessel Sonographer:     Thurman Coyer RDCS (AE) Performing Phys: [id number] VHQIONG Z ATKINS Diagnosing Phys: Earl Lites Stoltzfus  Complications: No known complications during this procedure. POST-OP IMPRESSIONS - Left Ventricle: The left ventricle is unchanged from pre-bypass. has normal systolic function. The cavity size was normal. The wall motion is normal. - Right Ventricle: The right ventricle appears unchanged from pre-bypass. - Aorta: The aorta appears unchanged from pre-bypass. - Left Atrial Appendage: The left atrial appendage appears unchanged from pre-bypass. - Aortic Valve: The aortic valve appears unchanged from pre-bypass. - Mitral Valve: The mitral valve appears unchanged from pre-bypass. - Tricuspid Valve: The tricuspid valve appears unchanged from pre-bypass. - Pulmonic Valve: The pulmonic valve appears unchanged from pre-bypass. - Interatrial Septum: The interatrial septum appears unchanged from pre-bypass. - Pericardium: The pericardium appears unchanged from pre-bypass. - Comments: S/P CABG X 3. Emergence on low dose phenylephrine. No new or worsening wall motion or valvular abnormalities. Preserved ejection fraction.  PRE-OP FINDINGS Left Ventricle: The left ventricle has normal systolic function, with an ejection fraction of 55-60%. The cavity size was normal. No evidence of left ventricular regional wall motion abnormalities. There is no left ventricular hypertrophy. Left ventricular diastolic parameters were normal.   Right Ventricle: The right ventricle has normal systolic function. The cavity was normal. There is no increase in right ventricular wall thickness.  Left Atrium: Left atrial size was normal in size. No left atrial/left atrial appendage thrombus was detected. There is echo contrast seen in the left atrial cavity. Left atrial appendage velocity is normal at greater than 40 cm/s.  Right Atrium: Right  atrial size was normal in size. Prominent Eustachian valve.  Interatrial Septum: No atrial level shunt detected by color flow Doppler. The interatrial septum appears to be lipomatous. There is no evidence of a patent foramen ovale.  Pericardium: There is no evidence of pericardial effusion. There is no evidence of cardiac tamponade. There is no pleural effusion.  Mitral Valve: The mitral valve is normal in structure. Mitral valve regurgitation is trivial by color flow Doppler. There is no evidence of mitral valve vegetation. There is No evidence of mitral  stenosis.  Tricuspid Valve: The tricuspid valve was normal in structure. Tricuspid valve regurgitation is trivial by color flow Doppler. No evidence of tricuspid stenosis is present. There is no evidence of tricuspid valve vegetation.  Aortic Valve: The aortic valve is tricuspid Aortic valve regurgitation was not visualized by color flow Doppler. There is no stenosis of the aortic valve, with a calculated valve area of 2.17 cm. There is no evidence of aortic valve vegetation.   Pulmonic Valve: The pulmonic valve was normal in structure, with normal. No evidence of pumonic stenosis. Pulmonic valve regurgitation is trivial by color flow Doppler.   Aorta: The aortic root and ascending aorta are normal in size and structure.  Pulmonary Artery: Theone Murdoch catheter present on the right. The pulmonary artery is of normal size.  Venous: The inferior vena cava is normal in size with greater than 50% respiratory variability, suggesting right atrial pressure of 3 mmHg.  Shunts: There is no evidence of an atrial septal defect.  +--------------+--------++ LEFT VENTRICLE         +----------------+---------++ +--------------+--------++ Diastology                PLAX 2D                +----------------+---------++ +--------------+--------++ LV e' lateral:  8.75 cm/s LVIDd:        4.79 cm   +----------------+---------++ +--------------+--------++ LV E/e' lateral:8.2       LVIDs:        3.33 cm  +----------------+---------++ +--------------+--------++ LV e' medial:   7.00 cm/s LVOT diam:    2.20 cm  +----------------+---------++ +--------------+--------++ LV E/e' medial: 10.2      LV SV:        62 ml    +----------------+---------++ +--------------+--------++ LV SV Index:  28.48    +----------------------+-------++ +--------------+--------++ 2D Longitudinal Strain        LVOT Area:    3.80 cm +----------------------+-------++ +--------------+--------++ 2D Strain GLS (A2C):  -24.7 %                        +----------------------+-------++ +--------------+--------++ 2D Strain GLS (A3C):  -15.4 % +----------------------+-------++ 2D Strain GLS (A4C):  -13.1 % +----------------------+-------++ 2D Strain GLS Avg:    -17.7 % +----------------------+-------++  +------------+--------++ 3D Volume EF         +------------+--------++ LV 3D EDV:  63.77 ml +------------+--------++ LV 3D ESV:  28.31 ml +------------+--------++  +---------------+------+-------+ RIGHT VENTRICLE              +---------------+------+-------+ TAPSE (M-mode):1.9 cm2.37 cm +---------------+------+-------+  +------------------+------------++ AORTIC VALVE                   +------------------+------------++ AV Area (Vmax):   2.26 cm     +------------------+------------++ AV Area (Vmean):  2.12 cm     +------------------+------------++ AV Area (VTI):    2.17 cm     +------------------+------------++ AV Vmax:          170.00 cm/s  +------------------+------------++ AV Vmean:         115.000 cm/s +------------------+------------++ AV VTI:           0.430 m      +------------------+------------++ AV Peak Grad:     11.6 mmHg    +------------------+------------++ AV Mean Grad:     6.0  mmHg     +------------------+------------++ LVOT Vmax:        101.00 cm/s  +------------------+------------++ LVOT Vmean:       64.000 cm/s  +------------------+------------++  LVOT VTI:         0.246 m      +------------------+------------++ LVOT/AV VTI ratio:0.57         +------------------+------------++  +-------------+-------++ AORTA                +-------------+-------++ Ao Root diam:2.60 cm +-------------+-------++ Ao STJ diam: 2.4 cm  +-------------+-------++ Ao Asc diam: 3.20 cm +-------------+-------++  +--------------+----------++ MITRAL VALVE               +--------------+-------+ +--------------+----------++   SHUNTS                MV Area (PHT):3.76 cm     +--------------+-------+ +--------------+----------++   Systemic VTI: 0.25 m  MV Peak grad: 2.2 mmHg     +--------------+-------+ +--------------+----------++   Systemic Diam:2.20 cm MV Mean grad: 1.0 mmHg     +--------------+-------+ +--------------+----------++ MV Vmax:      0.74 m/s   +--------------+----------++ MV Vmean:     42.0 cm/s  +--------------+----------++ MV PHT:       58.58 msec +--------------+----------++ MV Decel Time:202 msec   +--------------+----------++ +--------------+------------++ MV E velocity:71.40 cm/s   +--------------+------------++ MV A velocity:6790.00 cm/s +--------------+------------++ MV E/A ratio: 0.01         +--------------+------------++   Hester Mates Electronically signed by Hester Mates Signature Date/Time: 10/18/2020/2:28:43 PM    Final   CT SCANS  CT CORONARY FRACTIONAL FLOW RESERVE DATA PREP 09/29/2020  Narrative EXAM: FFRCT ANALYSIS  FINDINGS: FFRct analysis was performed on the original cardiac CT angiogram dataset. Diagrammatic representation of the FFRct analysis is provided in a separate PDF document in PACS. This dictation was created using  the PDF document and an interactive 3D model of the results. 3D model is not available in the EMR/PACS. Normal FFR range is >0.80.  1. Left Main: No significant stenosis modeled  2. LAD: CTFFR 0.71 across lesion in proximal LAD. CTFFR <0.5 across lesion in mid LAD  3. LCX: CTFFR 0.77 across lesion in proximal LCX. CTFFR 0.59 across lesion in distal LCX. Total occlusion of OM1  4. Ramus: CTFFR 0.86 across lesion in proximal ramus. CTFFR 0.65 across lesion in mid ramus.  5. RCA: CTFFR <0.5 across lesion in proximal RCA  IMPRESSION: 1. Severe multivessel obstructive CAD suggested by CTFFR. Cardiac catheterization is recommended.   Electronically Signed By: Epifanio Lesches MD On: 09/29/2020 21:25   CT CORONARY MORPH W/CTA COR W/SCORE 09/29/2020  Addendum 09/29/2020  8:11 PM ADDENDUM REPORT: 09/29/2020 20:09  EXAM: Cardiac/Coronary CTA  TECHNIQUE: The patient was scanned on a [employer].  FINDINGS: A 100 kV prospective scan was triggered in the descending thoracic aorta at 111 HU's. Axial non-contrast 3 mm slices were carried out through the heart. The data set was analyzed on a dedicated work station and scored using the Agatson method. Gantry rotation speed was 250 msecs and collimation was .6 mm. No beta blockade and 0.8 mg of sl NTG was given. The 3D data set was reconstructed in 5% intervals of the 67-82 % of the R-R cycle. Diastolic phases were analyzed on a dedicated work station using MPR, MIP and VRT modes. The patient received 80 cc of contrast.  Aorta: Normal size.  No calcifications.  No dissection.  Aortic Valve:  Trileaflet.  No calcifications.  Coronary Arteries:  Normal coronary origin.  Right dominance.  RCA is a small nondminant artery. There is calcified plaque in the proximal RCA causing 70-99% stenosis.  Left main is a large artery that  gives rise to LAD and LCX arteries. There is calcified plaque in the left main causing 24-49%  stenosis  LAD is a large vessel. There is calcified plaque in the proximal LAD causing 50-69% stenosis. There is calcified plaque in the mid LAD causing 50-69% stenosis  LCX is a dominant artery that gives rise to one large OM1 branch. There is calcified plaque in the proximal LCX causing 70-99% stenosis. There is calcified plaque in the mid LCX causing 50-69% stenosis. There is calcified plaque in the distal LCX causing 70-99% stenosis.  There is calcified plaque in ramus causing 70-99% stenosis  Other findings:  Left Ventricle: Normal size.  Mid inferior akinesis.  Left Atrium: Moderate enlargement  Pulmonary Veins: Normal configuration  Right Ventricle: Normal size  Right Atrium: Mild enlargement  Cardiac valves: No calcifications  Thoracic aorta: Normal size  Pulmonary Arteries: Dilated main PA measuring 33mm  Systemic Veins: Normal drainage  Pericardium: Normal thickness  IMPRESSION: 1. Coronary calcium score of 896. This was 84th percentile for age and sex matched control.  2. Normal coronary origin with left dominance.  3. Diffuse multivessel coronary calcifications. Degree of stenosis may be overestimated due to severe calcifications  4. Calcified plaque in proximal RCA causes severe (70-99%) stenosis. RCA is small and nondominant  5. Calcified plaque in the distal left main causes mild (24-49%) stenosis  6. Calcified plaque in the proximal LAD causes moderate (50-69%) stenosis. Calcified plaque in the mid LAD causing moderate (50-69%) stenosis  7. Calcified plaque in the proximal LCX causing severe (70-99%) stenosis. Calcified plaque in the mid LCX causes moderate (50-69%) stenosis. Calcified plaque in the distal LCX causes severe(70-99%) stenosis.  8. Akinesis of LV mid inferior wall  9. Will send study for CTFFR  CAD-RADS 4 Severe stenosis. (70-99% or > 50% left main). Cardiac catheterization or CT FFR is recommended. Consider  symptom-guided anti-ischemic pharmacotherapy as well as risk factor modification per guideline directed care.   Electronically Signed By: Epifanio Lesches MD On: 09/29/2020 20:09  Narrative EXAM: OVER-READ INTERPRETATION  CT CHEST  The following report is an over-read performed by radiologist Dr. Trudie Reed of [facility] on 09/29/2020. This over-read does not include interpretation of cardiac or coronary anatomy or pathology. The coronary calcium score/coronary CTA interpretation by the cardiologist is attached.  COMPARISON:  None.  FINDINGS: Aortic atherosclerosis. 2 mm right middle lobe pulmonary nodule (axial image 16 of series 12). Within the visualized portions of the thorax there are no other larger more suspicious appearing pulmonary nodules or masses, there is no acute consolidative airspace disease, no pleural effusions, no pneumothorax and no lymphadenopathy. Visualized portions of the upper abdomen are unremarkable. There are no aggressive appearing lytic or blastic lesions noted in the visualized portions of the skeleton.  IMPRESSION: 1. 2 mm right middle lobe pulmonary nodule, nonspecific, but statistically likely benign. No follow-up needed if patient is low-risk. Non-contrast chest CT can be considered in 12 months if patient is high-risk. This recommendation follows the consensus statement: Guidelines for Management of Incidental Pulmonary Nodules Detected on CT Images: From the Fleischner Society 2017; Radiology 2017; 284:228-243. 2.  Aortic Atherosclerosis (ICD10-I70.0).  Electronically Signed: By: Trudie Reed M.D. On: 09/29/2020 09:05              Recent Labs: 06/21/2023: ALT 11; BUN 17; Creatinine 1.20; Hemoglobin 13.9; Platelet Count 167; Potassium 3.3; Sodium 139  Recent Lipid Panel    Component Value Date/Time   CHOL 103 01/31/2022  1009   TRIG 68 01/31/2022 1009   HDL 46 01/31/2022 1009   CHOLHDL 2.2 01/31/2022  1009   LDLCALC 42 01/31/2022 1009    Physical Exam:    VS:  BP (!) 162/76   Pulse 69   Ht 5\' 10"  (1.778 m)   Wt 195 lb (88.5 kg)   SpO2 96%   BMI 27.98 kg/m     Wt Readings from Last 3 Encounters:  07/17/23 195 lb (88.5 kg)  05/23/23 191 lb 12.8 oz (87 kg)  03/29/23 187 lb 4.8 oz (85 kg)     GEN:  Well nourished, well developed in no acute distress HEENT: Normal NECK: No JVD; No carotid bruits LYMPHATICS: No lymphadenopathy CARDIAC: RRR, no murmurs, rubs, gallops RESPIRATORY:  Clear to auscultation without rales, wheezing or rhonchi  ABDOMEN: Soft, non-tender, non-distended MUSCULOSKELETAL:  No edema; No deformity  SKIN: Warm and dry NEUROLOGIC:  Alert and oriented x 3 PSYCHIATRIC:  Normal affect    Signed, Norman Herrlich, MD  07/17/2023 3:47 PM    [facility]

## 2023-07-18 NOTE — Progress Notes (Signed)
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 --PET DOTATATE was completed on 01/30/2022, report pending --Recommend monthly lanreotide injection q 28 days. Reviewed dose, frequency and common side effects including injection site reaction, cholelithiasis, gallbladder sludge, hyperglycemia, hypoglycemia, thyroid dysfunction and bradycardia.  --Labs from today were reviewed and require no intervention. Baseline Chromogranin levels were 159.7 on 02/01/2022.  PLAN: --Continue with monthly lanreotide injection q 28 days. --CBC and CMP every month before treatment. Chromagranin levels every 3 months --labs today show white blood cell 9.6, hemoglobin 14.3, MCV 88.3, platelets 159 and AST and ALT were within normal limits. --chromogranin A last 64.1 on 03/29/2023.  --last CT scan on 03/08/2023 showed the hepatic lesions are similar but there is a increase in the splenic lesions and what appeared to be a mixed response to therapy with new left upper quadrant omental nodules, however an index gastrocolic ligament implant is no longer visible. --Urology following for renal mass, details noted below.  --RTC in 3 months for a toxicity evaluation and continued follow up. Inteval CT scan  -restaging 05/2023 showed unchanged circumferential wall thickening of the terminal ileum and partially calcified metastatic nodule within the mesocolon, not significantly changed.  Findings consistent with  primary small bowel neuroendocrine malignancy.  The hypodense liver metastases in the posterior/inferior right lobe of the liver was significantly diminished in size and difficult to clearly visualize.  Splenic lesions previously seen were very difficult to clearly visualize.  The smaller lesion not definitely present.  The larger lesion probably subtly present but diminished in size.  There was no significant change in the omental and peritoneal soft tissue nodules throughout the ventral abdomen and left upper quadrant of the abdomen.  Pleural nodule in the right lung base and small nodule of dependent lower left lobe are unchanged.  There are multiple small subcutaneous soft tissue nodules of the buttocks, some of which were new compared to prior exams, others unchanged.  These are likely injection granulomata have given distribution there is small volume fluid in the right lower quadrant of the pelvis.  There is also evidence of treatment response of visceral metastases involving the liver and spleen, other disease unchanged.      Plan: Labs reviewed  -CBC showing WBC 8.5; Hgb 14.6; Hct 41.0; Plt 145; Anc 6.5 -CMP - K 4.1; glucose 294; BUN 13; Creatinine 1 5; eGFR > 60; Ca 9.4; LFTs normal.   Reviewed stable CT of chest abdomen pelvis done 06/15/2023. Proceed with lanreotide injection today. Continue lanreotide injections monthly. Labs with follow-up and treatment in 3 months.  The patient understands the plans discussed today and is in agreement with them.  He knows to contact our office if he develops concerns prior to his next appointment.  I provided 25 minutes of face-to-face time during this encounter and > 50% was spent counseling as documented under my assessment and plan.    Carlean Jews, NP  [facility] - A DEPT OF [place]. [facility] [address] [place]  [address] Dept: [phone] Dept Fax: [phone]   No  orders of the defined types were placed in this encounter.     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  He was last seen by Dr. Leonides Schanz on 05/23/2023.  Clinically doing well.  CT scan of chest abdomen and pelvis done 05/2023, showed unchanged circumferential wall thickening of the terminal ileum and partially calcified metastatic nodule within the mesocolon, not significantly changed.  Findings consistent with primary small bowel neuroendocrine malignancy.  The hypodense liver metastases in the posterior/inferior right lobe of the liver was significantly diminished in size and difficult to clearly visualize.  Splenic lesions previously seen were very difficult to clearly visualize.  The smaller lesion not definitely present.  The larger lesion probably subtly present but diminished in size.  There was no significant change in the omental and peritoneal soft tissue nodules throughout the ventral abdomen and left upper quadrant of the abdomen.  Pleural nodule in the right lung base and small nodule of dependent lower left lobe are unchanged.  There are multiple small subcutaneous soft tissue nodules of the buttocks, some of which were new compared to prior exams, others unchanged.  These are likely injection granulomata have given distribution there is small volume fluid in the right lower quadrant of the pelvis.  There is also evidence of treatment response of visceral metastases involving the liver and spleen, other disease unchanged.  He denies fevers or chills. He denies pain. His appetite is good. His weight has been stable.  I have reviewed the past medical history, past surgical history, social history and family history with the patient and they are unchanged from previous note.  ALLERGIES:  is allergic to ace inhibitors, albumin  (human), beef-derived drug products, gelfoam-jmi sponge, heparin, pork-derived products, sheep-derived products, and tetanus toxoids.  MEDICATIONS:  Current Outpatient Medications  Medication Sig Dispense Refill   amLODipine (NORVASC) 10 MG tablet Take 10 mg by mouth in the morning.     apixaban (ELIQUIS) 5 MG TABS tablet Take 1 tablet (5 mg total) by mouth 2 (two) times daily. 60 tablet 5   carvedilol (COREG) 25 MG tablet Take 12.5 mg by mouth 2 (two) times daily with a meal.     ezetimibe (ZETIA) 10 MG tablet TAKE 1 TABLET BY MOUTH EVERY DAY 90 tablet 1   fluticasone (FLONASE) 50 MCG/ACT nasal spray Place 2 sprays into both nostrils daily.     glimepiride (AMARYL) 4 MG tablet Take 4 mg by mouth daily with breakfast.     metFORMIN (GLUCOPHAGE) 1000 MG tablet Take 1,000 mg by mouth 2 (two) times daily with a meal.     pantoprazole (PROTONIX) 40 MG tablet Take 1 tablet (40 mg total) by mouth daily. 90 tablet 1   polyethylene glycol (MIRALAX / GLYCOLAX) 17 g packet Take 17 g by mouth daily as needed (constipation).     rosuvastatin (CRESTOR) 40 MG tablet Take 1 tablet (40 mg total) by mouth daily. 90 tablet 3   senna-docusate (SENOKOT-S) 8.6-50 MG tablet Take 1 tablet by mouth 2 (two) times daily. While taking stong pain meds to prevent constipaiton 10 tablet 0   Tamsulosin HCl (FLOMAX) 0.4 MG CAPS Take 0.8 mg by mouth at bedtime.     traZODone (DESYREL) 50 MG tablet Take 50 mg by mouth at bedtime as needed for sleep.  Trospium Chloride 60 MG CP24 Take 1 capsule by mouth daily.     valsartan (DIOVAN) 160 MG tablet Take 1 tablet (160 mg total) by mouth daily. 90 tablet 3   EPINEPHrine (EPIPEN 2-PAK) 0.3 mg/0.3 mL IJ SOAJ injection Use as directed for life-threatening allergic reaction. 2 each 3   oxyCODONE-acetaminophen (PERCOCET) 5-325 MG tablet Take 1 tablet by mouth every 6 (six) hours as needed for severe pain or moderate pain (post-operatively). (Patient not taking: Reported on 07/19/2023)  10 tablet 0   No current facility-administered medications for this visit.    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 Unchanged circumferential wall thickening of the terminal ileum, with multiple fecalized loops of distal ileum and adjacent, partially calcified metastatic nodule within the mesocolon, not significantly changed. Findings consistent with primary small bowel neuroendocrine malignancy. 2. Hypodense liver metastasis in the posteroinferior right lobe of the liver significantly diminished in conspicuity and difficult to clearly visualize on today's examination, slightly diminished in size. 3. Splenic lesions seen on prior examination are very difficult to clearly visualize, the smaller not definitely present, larger probably very subtly present but diminished in size. 4. Unchanged large mass in the superior pole of the right kidney. 5. No significant change in numerous omental and peritoneal soft tissue nodules throughout the ventral abdomen and left upper quadrant. 6. Unchanged pleural nodule of the right lung base and small nodule of the dependent left lower lobe. 7. Multiple small subcutaneous soft tissue nodules of the buttocks, some of which are new compared to prior examination, others unchanged. These are most likely injection granulomata of given distribution. 8. New small volume fluid in the right lower quadrant and pelvis. 9. Some evidence of treatment response of visceral metastases involving the liver and spleen, other disease unchanged. 10. Coronary artery disease.       REVIEW OF SYSTEMS:   Constitutional: Denies fevers, chills or abnormal weight loss Eyes: Denies blurriness of vision Ears, nose, mouth, throat, and face:  Denies mucositis or sore throat Respiratory: Denies cough, dyspnea or wheezes Cardiovascular: Denies palpitation, chest discomfort or lower extremity swelling Gastrointestinal:  Denies nausea, heartburn or change in bowel habits Skin: Denies abnormal skin rashes Lymphatics: Denies new lymphadenopathy or easy bruising Neurological:Denies numbness, tingling or new weaknesses Behavioral/Psych: Mood is stable, no new changes  All other systems were reviewed with the patient and are negative.   VITALS:   Today's Vitals   07/19/23 1405 07/19/23 1407  BP: (!) 182/89   Pulse: 66   Resp: 13   Temp: 98.3 F (36.8 C)   TempSrc: Temporal   SpO2: 97%   Weight: 196 lb 4.8 oz (89 kg)   PainSc:  0-No pain   Body mass index is 28.17 kg/m.   Wt Readings from Last 3 Encounters:  07/19/23 196 lb 4.8 oz (89 kg)  07/17/23 195 lb (88.5 kg)  05/23/23 191 lb 12.8 oz (87 kg)    Body mass index is 28.17 kg/m.  Performance status (ECOG): 1 - Symptomatic but completely ambulatory  PHYSICAL EXAM:   GENERAL:alert, no distress and comfortable SKIN: skin color, texture, turgor are normal, no rashes or significant lesions EYES: normal, Conjunctiva are pink and non-injected, sclera clear OROPHARYNX:no exudate, no erythema and lips, buccal mucosa, and tongue normal  NECK: supple, thyroid normal size, non-tender, without nodularity LYMPH:  no palpable lymphadenopathy in the cervical, axillary or inguinal LUNGS: clear to auscultation and percussion with normal breathing effort HEART: regular rate & rhythm and no murmurs and no lower extremity edema ABDOMEN:abdomen soft, non-tender and normal bowel sounds Musculoskeletal:no cyanosis of digits and no clubbing  NEURO: alert & oriented x 3 with fluent speech, no focal motor/sensory deficits  LABORATORY DATA:  I have reviewed the data as listed    Component Value Date/Time   NA 136 07/19/2023 1315   NA 138 01/31/2022 1009   K 4.1 07/19/2023 1315   CL  101 07/19/2023 1315   CO2 28 07/19/2023 1315   GLUCOSE 294 (H) 07/19/2023 1315   BUN 13 07/19/2023 1315   BUN 12 01/31/2022 1009   CREATININE 1.15 07/19/2023 1315   CALCIUM 9.4 07/19/2023 1315   PROT 6.5 07/19/2023 1315   PROT 6.2 01/31/2022 1009   ALBUMIN 4.3 07/19/2023 1315   ALBUMIN 4.3 01/31/2022 1009   AST 16 07/19/2023 1315   ALT 14 07/19/2023 1315   ALKPHOS 68 07/19/2023 1315   BILITOT 0.7 07/19/2023 1315   GFRNONAA >60 07/19/2023 1315   GFRAA 87 (L) 06/08/2012 0339    Lab Results  Component Value Date   WBC 8.5 07/19/2023   NEUTROABS 6.5 07/19/2023   HGB 14.6 07/19/2023   HCT 41.0 07/19/2023   MCV 85.8 07/19/2023   PLT 145 (L) 07/19/2023

## 2023-07-18 NOTE — Assessment & Plan Note (Addendum)
Well differentiated neuroendocrine carcinoma, low grade: --CT guided peritoneal mass biopsy on 01/18/2022 confirmed above diagnosis. --PET DOTATATE was completed on 01/30/2022, report pending --Recommend monthly lanreotide injection q 28 days. Reviewed dose, frequency and common side effects including injection site reaction, cholelithiasis, gallbladder sludge, hyperglycemia, hypoglycemia, thyroid dysfunction and bradycardia.  --Labs from today were reviewed and require no intervention. Baseline Chromogranin levels were 159.7 on 02/01/2022.  PLAN: --Continue with monthly lanreotide injection q 28 days. --CBC and CMP every month before treatment. Chromagranin levels every 3 months --labs today show white blood cell 9.6, hemoglobin 14.3, MCV 88.3, platelets 159 and AST and ALT were within normal limits. --chromogranin A last 64.1 on 03/29/2023.  --last CT scan on 03/08/2023 showed the hepatic lesions are similar but there is a increase in the splenic lesions and what appeared to be a mixed response to therapy with new left upper quadrant omental nodules, however an index gastrocolic ligament implant is no longer visible. --Urology following for renal mass, details noted below.  --RTC in 3 months for a toxicity evaluation and continued follow up. Inteval CT scan  -restaging 05/2023 showed unchanged circumferential wall thickening of the terminal ileum and partially calcified metastatic nodule within the mesocolon, not significantly changed.  Findings consistent with primary small bowel neuroendocrine malignancy.  The hypodense liver metastases in the posterior/inferior right lobe of the liver was significantly diminished in size and difficult to clearly visualize.  Splenic lesions previously seen were very difficult to clearly visualize.  The smaller lesion not definitely present.  The larger lesion probably subtly present but diminished in size.  There was no significant change in the omental and peritoneal soft  tissue nodules throughout the ventral abdomen and left upper quadrant of the abdomen.  Pleural nodule in the right lung base and small nodule of dependent lower left lobe are unchanged.  There are multiple small subcutaneous soft tissue nodules of the buttocks, some of which were new compared to prior exams, others unchanged.  These are likely injection granulomata have given distribution there is small volume fluid in the right lower quadrant of the pelvis.  There is also evidence of treatment response of visceral metastases involving the liver and spleen, other disease unchanged.   07/19/2023 -patient condition stable.  Will continue with monthly lanreotide injections with follow-ups every 3 months.

## 2023-07-19 ENCOUNTER — Inpatient Hospital Stay: Payer: Medicare HMO

## 2023-07-19 ENCOUNTER — Inpatient Hospital Stay: Payer: Medicare HMO | Attending: Physician Assistant

## 2023-07-19 ENCOUNTER — Inpatient Hospital Stay: Payer: Medicare HMO | Admitting: Hematology and Oncology

## 2023-07-19 ENCOUNTER — Other Ambulatory Visit: Payer: Self-pay

## 2023-07-19 ENCOUNTER — Inpatient Hospital Stay (HOSPITAL_BASED_OUTPATIENT_CLINIC_OR_DEPARTMENT_OTHER): Payer: Medicare HMO | Admitting: Nurse Practitioner

## 2023-07-19 VITALS — BP 182/89 | HR 66 | Temp 98.3°F | Resp 13 | Wt 196.3 lb

## 2023-07-19 DIAGNOSIS — C7B8 Other secondary neuroendocrine tumors: Secondary | ICD-10-CM

## 2023-07-19 DIAGNOSIS — C7A098 Malignant carcinoid tumors of other sites: Secondary | ICD-10-CM | POA: Diagnosis not present

## 2023-07-19 DIAGNOSIS — C7B09 Secondary carcinoid tumors of other sites: Secondary | ICD-10-CM | POA: Diagnosis not present

## 2023-07-19 DIAGNOSIS — C7A8 Other malignant neuroendocrine tumors: Secondary | ICD-10-CM | POA: Diagnosis not present

## 2023-07-19 LAB — CBC WITH DIFFERENTIAL (CANCER CENTER ONLY)
Abs Immature Granulocytes: 0.02 10*3/uL (ref 0.00–0.07)
Basophils Absolute: 0.1 10*3/uL (ref 0.0–0.1)
Basophils Relative: 1 %
Eosinophils Absolute: 0.4 10*3/uL (ref 0.0–0.5)
Eosinophils Relative: 5 %
HCT: 41 % (ref 39.0–52.0)
Hemoglobin: 14.6 g/dL (ref 13.0–17.0)
Immature Granulocytes: 0 %
Lymphocytes Relative: 8 %
Lymphs Abs: 0.7 10*3/uL (ref 0.7–4.0)
MCH: 30.5 pg (ref 26.0–34.0)
MCHC: 35.6 g/dL (ref 30.0–36.0)
MCV: 85.8 fL (ref 80.0–100.0)
Monocytes Absolute: 0.8 10*3/uL (ref 0.1–1.0)
Monocytes Relative: 10 %
Neutro Abs: 6.5 10*3/uL (ref 1.7–7.7)
Neutrophils Relative %: 76 %
Platelet Count: 145 10*3/uL — ABNORMAL LOW (ref 150–400)
RBC: 4.78 MIL/uL (ref 4.22–5.81)
RDW: 12.6 % (ref 11.5–15.5)
WBC Count: 8.5 10*3/uL (ref 4.0–10.5)
nRBC: 0 % (ref 0.0–0.2)

## 2023-07-19 LAB — CMP (CANCER CENTER ONLY)
ALT: 14 U/L (ref 0–44)
AST: 16 U/L (ref 15–41)
Albumin: 4.3 g/dL (ref 3.5–5.0)
Alkaline Phosphatase: 68 U/L (ref 38–126)
Anion gap: 7 (ref 5–15)
BUN: 13 mg/dL (ref 8–23)
CO2: 28 mmol/L (ref 22–32)
Calcium: 9.4 mg/dL (ref 8.9–10.3)
Chloride: 101 mmol/L (ref 98–111)
Creatinine: 1.15 mg/dL (ref 0.61–1.24)
GFR, Estimated: >60 mL/min (ref >60)
Glucose, Bld: 294 mg/dL — ABNORMAL HIGH (ref 70–99)
Potassium: 4.1 mmol/L (ref 3.5–5.1)
Sodium: 136 mmol/L (ref 135–145)
Total Bilirubin: 0.7 mg/dL (ref 0.0–1.2)
Total Protein: 6.5 g/dL (ref 6.5–8.1)

## 2023-07-19 MED ORDER — LANREOTIDE ACETATE 120 MG/0.5ML ~~LOC~~ SOLN
120.0000 mg | Freq: Once | SUBCUTANEOUS | Status: AC
  Administered 2023-07-19: 120 mg via SUBCUTANEOUS
  Filled 2023-07-19: qty 120

## 2023-07-21 LAB — CHROMOGRANIN A: Chromogranin A (ng/mL): 59.2 ng/mL (ref 0.0–101.8)

## 2023-07-22 ENCOUNTER — Encounter: Payer: Self-pay | Admitting: Nurse Practitioner

## 2023-07-22 ENCOUNTER — Encounter: Payer: Self-pay | Admitting: Hematology and Oncology

## 2023-07-26 ENCOUNTER — Other Ambulatory Visit: Payer: Self-pay | Admitting: Cardiology

## 2023-07-26 DIAGNOSIS — I493 Ventricular premature depolarization: Secondary | ICD-10-CM

## 2023-07-26 DIAGNOSIS — E118 Type 2 diabetes mellitus with unspecified complications: Secondary | ICD-10-CM

## 2023-07-26 DIAGNOSIS — E785 Hyperlipidemia, unspecified: Secondary | ICD-10-CM

## 2023-07-26 DIAGNOSIS — I25119 Atherosclerotic heart disease of native coronary artery with unspecified angina pectoris: Secondary | ICD-10-CM

## 2023-07-26 DIAGNOSIS — I119 Hypertensive heart disease without heart failure: Secondary | ICD-10-CM

## 2023-07-26 DIAGNOSIS — I6523 Occlusion and stenosis of bilateral carotid arteries: Secondary | ICD-10-CM

## 2023-07-30 ENCOUNTER — Ambulatory Visit: Payer: Medicare HMO | Admitting: Cardiology

## 2023-08-16 ENCOUNTER — Inpatient Hospital Stay: Payer: Medicare HMO

## 2023-08-16 ENCOUNTER — Inpatient Hospital Stay: Payer: Medicare HMO | Attending: Physician Assistant

## 2023-08-16 VITALS — BP 117/66 | HR 57 | Temp 98.1°F | Resp 16

## 2023-08-16 DIAGNOSIS — C7A8 Other malignant neuroendocrine tumors: Secondary | ICD-10-CM

## 2023-08-16 DIAGNOSIS — C7B09 Secondary carcinoid tumors of other sites: Secondary | ICD-10-CM | POA: Diagnosis not present

## 2023-08-16 DIAGNOSIS — C7A098 Malignant carcinoid tumors of other sites: Secondary | ICD-10-CM | POA: Diagnosis not present

## 2023-08-16 LAB — CBC WITH DIFFERENTIAL (CANCER CENTER ONLY)
Abs Immature Granulocytes: 0.06 10*3/uL (ref 0.00–0.07)
Basophils Absolute: 0.1 10*3/uL (ref 0.0–0.1)
Basophils Relative: 1 %
Eosinophils Absolute: 0.3 10*3/uL (ref 0.0–0.5)
Eosinophils Relative: 3 %
HCT: 42.2 % (ref 39.0–52.0)
Hemoglobin: 14.7 g/dL (ref 13.0–17.0)
Immature Granulocytes: 1 %
Lymphocytes Relative: 5 %
Lymphs Abs: 0.5 10*3/uL — ABNORMAL LOW (ref 0.7–4.0)
MCH: 30.5 pg (ref 26.0–34.0)
MCHC: 34.8 g/dL (ref 30.0–36.0)
MCV: 87.6 fL (ref 80.0–100.0)
Monocytes Absolute: 1.2 10*3/uL — ABNORMAL HIGH (ref 0.1–1.0)
Monocytes Relative: 12 %
Neutro Abs: 8 10*3/uL — ABNORMAL HIGH (ref 1.7–7.7)
Neutrophils Relative %: 78 %
Platelet Count: 172 10*3/uL (ref 150–400)
RBC: 4.82 MIL/uL (ref 4.22–5.81)
RDW: 12 % (ref 11.5–15.5)
WBC Count: 10.1 10*3/uL (ref 4.0–10.5)
nRBC: 0 % (ref 0.0–0.2)

## 2023-08-16 LAB — CMP (CANCER CENTER ONLY)
ALT: 14 U/L (ref 0–44)
AST: 19 U/L (ref 15–41)
Albumin: 4.2 g/dL (ref 3.5–5.0)
Alkaline Phosphatase: 65 U/L (ref 38–126)
Anion gap: 5 (ref 5–15)
BUN: 16 mg/dL (ref 8–23)
CO2: 29 mmol/L (ref 22–32)
Calcium: 9.4 mg/dL (ref 8.9–10.3)
Chloride: 104 mmol/L (ref 98–111)
Creatinine: 1.56 mg/dL — ABNORMAL HIGH (ref 0.61–1.24)
GFR, Estimated: 47 mL/min — ABNORMAL LOW (ref >60)
Glucose, Bld: 161 mg/dL — ABNORMAL HIGH (ref 70–99)
Potassium: 4.1 mmol/L (ref 3.5–5.1)
Sodium: 138 mmol/L (ref 135–145)
Total Bilirubin: 0.9 mg/dL (ref 0.0–1.2)
Total Protein: 6.5 g/dL (ref 6.5–8.1)

## 2023-08-16 MED ORDER — LANREOTIDE ACETATE 120 MG/0.5ML ~~LOC~~ SOLN
120.0000 mg | Freq: Once | SUBCUTANEOUS | Status: AC
  Administered 2023-08-16: 120 mg via SUBCUTANEOUS
  Filled 2023-08-16: qty 120

## 2023-08-20 LAB — CHROMOGRANIN A: Chromogranin A (ng/mL): 57.2 ng/mL (ref 0.0–101.8)

## 2023-08-29 ENCOUNTER — Other Ambulatory Visit: Payer: Self-pay | Admitting: Hematology and Oncology

## 2023-08-30 ENCOUNTER — Encounter: Payer: Self-pay | Admitting: Hematology and Oncology

## 2023-09-13 ENCOUNTER — Inpatient Hospital Stay: Payer: Medicare HMO | Attending: Physician Assistant

## 2023-09-13 ENCOUNTER — Inpatient Hospital Stay: Payer: Medicare HMO

## 2023-09-13 VITALS — BP 151/74 | HR 65 | Temp 97.9°F | Resp 16

## 2023-09-13 DIAGNOSIS — C7B8 Other secondary neuroendocrine tumors: Secondary | ICD-10-CM | POA: Diagnosis not present

## 2023-09-13 DIAGNOSIS — C7A8 Other malignant neuroendocrine tumors: Secondary | ICD-10-CM | POA: Diagnosis not present

## 2023-09-13 LAB — CMP (CANCER CENTER ONLY)
ALT: 10 U/L (ref 0–44)
AST: 13 U/L — ABNORMAL LOW (ref 15–41)
Albumin: 4.4 g/dL (ref 3.5–5.0)
Alkaline Phosphatase: 61 U/L (ref 38–126)
Anion gap: 3 — ABNORMAL LOW (ref 5–15)
BUN: 14 mg/dL (ref 8–23)
CO2: 30 mmol/L (ref 22–32)
Calcium: 9.3 mg/dL (ref 8.9–10.3)
Chloride: 105 mmol/L (ref 98–111)
Creatinine: 1.11 mg/dL (ref 0.61–1.24)
GFR, Estimated: >60 mL/min (ref >60)
Glucose, Bld: 158 mg/dL — ABNORMAL HIGH (ref 70–99)
Potassium: 4.4 mmol/L (ref 3.5–5.1)
Sodium: 138 mmol/L (ref 135–145)
Total Bilirubin: 0.7 mg/dL (ref 0.0–1.2)
Total Protein: 6.6 g/dL (ref 6.5–8.1)

## 2023-09-13 LAB — CBC WITH DIFFERENTIAL (CANCER CENTER ONLY)
Abs Immature Granulocytes: 0.02 10*3/uL (ref 0.00–0.07)
Basophils Absolute: 0.1 10*3/uL (ref 0.0–0.1)
Basophils Relative: 1 %
Eosinophils Absolute: 0.7 10*3/uL — ABNORMAL HIGH (ref 0.0–0.5)
Eosinophils Relative: 8 %
HCT: 40.4 % (ref 39.0–52.0)
Hemoglobin: 14.3 g/dL (ref 13.0–17.0)
Immature Granulocytes: 0 %
Lymphocytes Relative: 9 %
Lymphs Abs: 0.7 10*3/uL (ref 0.7–4.0)
MCH: 31.2 pg (ref 26.0–34.0)
MCHC: 35.4 g/dL (ref 30.0–36.0)
MCV: 88.2 fL (ref 80.0–100.0)
Monocytes Absolute: 0.8 10*3/uL (ref 0.1–1.0)
Monocytes Relative: 9 %
Neutro Abs: 6.2 10*3/uL (ref 1.7–7.7)
Neutrophils Relative %: 73 %
Platelet Count: 145 10*3/uL — ABNORMAL LOW (ref 150–400)
RBC: 4.58 MIL/uL (ref 4.22–5.81)
RDW: 12.5 % (ref 11.5–15.5)
WBC Count: 8.4 10*3/uL (ref 4.0–10.5)
nRBC: 0 % (ref 0.0–0.2)

## 2023-09-13 MED ORDER — LANREOTIDE ACETATE 120 MG/0.5ML ~~LOC~~ SOLN
120.0000 mg | Freq: Once | SUBCUTANEOUS | Status: AC
  Administered 2023-09-13: 120 mg via SUBCUTANEOUS
  Filled 2023-09-13: qty 120

## 2023-09-15 LAB — CHROMOGRANIN A: Chromogranin A (ng/mL): 50.9 ng/mL (ref 0.0–101.8)

## 2023-10-11 ENCOUNTER — Ambulatory Visit: Payer: Medicare HMO

## 2023-10-11 ENCOUNTER — Inpatient Hospital Stay: Payer: Medicare HMO | Admitting: Hematology and Oncology

## 2023-10-11 ENCOUNTER — Inpatient Hospital Stay: Payer: Medicare HMO | Attending: Physician Assistant

## 2023-10-11 ENCOUNTER — Other Ambulatory Visit: Payer: Medicare HMO

## 2023-10-11 ENCOUNTER — Encounter: Payer: Self-pay | Admitting: Hematology and Oncology

## 2023-10-11 ENCOUNTER — Inpatient Hospital Stay: Payer: Medicare HMO

## 2023-10-11 VITALS — BP 165/84 | HR 66 | Temp 98.1°F | Resp 13 | Wt 193.5 lb

## 2023-10-11 DIAGNOSIS — C7A8 Other malignant neuroendocrine tumors: Secondary | ICD-10-CM

## 2023-10-11 DIAGNOSIS — C7B8 Other secondary neuroendocrine tumors: Secondary | ICD-10-CM

## 2023-10-11 LAB — CBC WITH DIFFERENTIAL (CANCER CENTER ONLY)
Abs Immature Granulocytes: 0.02 10*3/uL (ref 0.00–0.07)
Basophils Absolute: 0.1 10*3/uL (ref 0.0–0.1)
Basophils Relative: 1 %
Eosinophils Absolute: 0.5 10*3/uL (ref 0.0–0.5)
Eosinophils Relative: 6 %
HCT: 38 % — ABNORMAL LOW (ref 39.0–52.0)
Hemoglobin: 13.8 g/dL (ref 13.0–17.0)
Immature Granulocytes: 0 %
Lymphocytes Relative: 7 %
Lymphs Abs: 0.6 10*3/uL — ABNORMAL LOW (ref 0.7–4.0)
MCH: 31.3 pg (ref 26.0–34.0)
MCHC: 36.3 g/dL — ABNORMAL HIGH (ref 30.0–36.0)
MCV: 86.2 fL (ref 80.0–100.0)
Monocytes Absolute: 0.8 10*3/uL (ref 0.1–1.0)
Monocytes Relative: 9 %
Neutro Abs: 7.1 10*3/uL (ref 1.7–7.7)
Neutrophils Relative %: 77 %
Platelet Count: 142 10*3/uL — ABNORMAL LOW (ref 150–400)
RBC: 4.41 MIL/uL (ref 4.22–5.81)
RDW: 12.2 % (ref 11.5–15.5)
WBC Count: 9.1 10*3/uL (ref 4.0–10.5)
nRBC: 0 % (ref 0.0–0.2)

## 2023-10-11 LAB — CMP (CANCER CENTER ONLY)
ALT: 13 U/L (ref 0–44)
AST: 15 U/L (ref 15–41)
Albumin: 4.3 g/dL (ref 3.5–5.0)
Alkaline Phosphatase: 62 U/L (ref 38–126)
Anion gap: 5 (ref 5–15)
BUN: 16 mg/dL (ref 8–23)
CO2: 26 mmol/L (ref 22–32)
Calcium: 9.1 mg/dL (ref 8.9–10.3)
Chloride: 107 mmol/L (ref 98–111)
Creatinine: 1.13 mg/dL (ref 0.61–1.24)
GFR, Estimated: >60 mL/min (ref >60)
Glucose, Bld: 197 mg/dL — ABNORMAL HIGH (ref 70–99)
Potassium: 4.2 mmol/L (ref 3.5–5.1)
Sodium: 138 mmol/L (ref 135–145)
Total Bilirubin: 0.8 mg/dL (ref 0.0–1.2)
Total Protein: 6.4 g/dL — ABNORMAL LOW (ref 6.5–8.1)

## 2023-10-11 MED ORDER — LANREOTIDE ACETATE 120 MG/0.5ML ~~LOC~~ SOLN
120.0000 mg | Freq: Once | SUBCUTANEOUS | Status: AC
  Administered 2023-10-11: 120 mg via SUBCUTANEOUS
  Filled 2023-10-11: qty 120

## 2023-10-11 NOTE — Progress Notes (Signed)
 [facility] [phone]   Fax:([phone]) [phone]  PROGRESS NOTE:  Patient Care Team: Annette Barters, MD as PCP - General (Family Medicine) Jasai Sorg Gault, MD as Attending Physician (Cardiology) Opal Bill, MD (Inactive) as Attending Physician (Radiation Oncology) Ann Barnacle, MD (Inactive) as Attending Physician (Urology)  CHIEF COMPLAINTS/PURPOSE OF CONSULTATION:  Right renal mass and peritoneal nodularity  ONCOLOGIC HISTORY: Presented to the [facility] ER on 12/11/2021 for painless hematuria and urinary urgency.  Work-up included urinalysis showed large leukocyte esterase, blood, protein, positive nitrates.  Urine culture grew E. coli.  Patient was treated with cefdinir.  He underwent CT imaging that showed a nonobstructing 6 mm stone in the interpolar left kidney, indeterminate 2.9 x 3.4 x 4.3 cm lesion in the upper pole right kidney, multiple peritoneal and omental nodules concerning for peritoneal carcinomatosis. 01/10/2022: CT chest: Incidental right lower lobe segmental and subsegmental pulmonary emboli.Minimal clot burden without right heart strain.No evidence of intrathoracic metastases.Small focus of ground-glass airspace disease within the right upper lobe likely inflammatory or infectious. Started on Eliquis  therapy 01/18/2022: CT guided biopsy of peritoneal mass: Well-differentiated low-grade neuroendocrine neoplasm with features  suspicious carcinoid tumor. Ki-67 shows very low proliferative index score.  01/30/2022: PET DOTATATE showed extensive tracer avid peritoneal metastasis and tracer avid liver metastasis to the posterior right hepatic lobe. 02/01/2022: Start lanreotide monthly injections 6.   01/26/2023: CT renal biopsy showed low grade papillary carcinoma of the kidney. Urology opted for surveillance rather than surgery.   INTERIM HISTORY:  Daniel Gibson returns today for a follow up visit for a low grade neuroendocrine  carcinoma.   On exam today Daniel Gibson is accompanied by his wife and son.  He reports he has been having some occasional bouts of nausea.  He did have a viral illness recently which has subsequently resolved.  He reports that he does feel occasionally queasy but has not been taking his nausea medications.  His wife and son report he has had an episode of vomiting.  He does occasionally lose his balance but has not had any frank falls.  He does feel little bit more fatigued.  He unfortunately has been down since his brother died in 2023/10/03.  He notes that his appetite has been good. Overall he is willing and able to proceed with lanreotide shots at this time. He has no other complaints. Rest of the 10 point ROS is below.   MEDICAL HISTORY:  Past Medical History:  Diagnosis Date   Abnormal cardiac CT angiography    Allergy to alpha-gal 12/27/2021   CAD (coronary artery disease), native coronary artery 04/08/2018   Cath 10/1991 in setting of inferior MI  [facility]      normal Left main, scattered irregularities LAD, 40% stenosis proximal Ramus, occluded OM 2, 60% stenosis proximal PDA, dominant CFX, small and nondominant RCA     Carotid artery disease (HCC) 05/08/2012   CEA on left 2013   Coronary artery disease    Diabetes mellitus type 2, noninsulin dependent (HCC) 04/08/2018   Diabetes mellitus without complication (HCC)    Food allergy    Alpha Gal allergy    GERD (gastroesophageal reflux disease)    Hyperlipidemia    Hypertensive heart disease without CHF 04/08/2018   Myocardial infarction [facility]) 1993   medically managed   Neuroendocrine carcinoma metastatic to liver [facility])    Neuroendocrine carcinoma metastatic to multiple sites (HCC) 01/26/2022   Obesity (BMI 30-39.9) 04/08/2018   Old myocardial  infarct 04/08/2018   Peritoneal lesion 01/04/2022   Prostate cancer (HCC) 08/07/2016   Pulmonary embolism (HCC)    PVC's (premature ventricular contractions) 04/08/2018   Pyelonephritis  12/27/2021   Renal calculus, left 12/27/2021   S/P CABG x 3 10/18/2020   Thrombocytopenia (HCC)     SURGICAL HISTORY: Past Surgical History:  Procedure Laterality Date   APPENDECTOMY     CAROTID ENDARTERECTOMY Right 06-07-12   cea   CORONARY ARTERY BYPASS GRAFT N/A 10/18/2020   Procedure: CORONARY ARTERY BYPASS GRAFTING (CABG)x3. LEFT INTERNAL MAMMARY ARTERY. RIGHT ENDOSCOPIC SAPHENOUS VEIN HARVESTING.;  Surgeon: Rudine Cos, MD;  Location: [facility] OR;  Service: Open Heart Surgery;  Laterality: N/A;   CYSTOSCOPY WITH RETROGRADE PYELOGRAM, URETEROSCOPY AND STENT PLACEMENT Right 01/10/2023   Procedure: CYSTOSCOPY WITH RETROGRADE PYELOGRAM, RIGHT DIAGNOSTIC URETEROSCOPY;  Surgeon: Melody Spurling., MD;  Location: [facility] ORS;  Service: Urology;  Laterality: Right;   ENDARTERECTOMY  06/07/2012   Procedure: ENDARTERECTOMY CAROTID;  Surgeon: Dannis Dy, MD;  Location: [facility] OR;  Service: Vascular;  Laterality: Right;  Right Carotid Endarterectomy with Patch Angioplasty   LEFT HEART CATH AND CORONARY ANGIOGRAPHY N/A 10/07/2020   Procedure: LEFT HEART CATH AND CORONARY ANGIOGRAPHY;  Surgeon: Millicent Ally, MD;  Location: [facility] INVASIVE CV LAB;  Service: Cardiovascular;  Laterality: N/A;   TEE WITHOUT CARDIOVERSION N/A 10/18/2020   Procedure: TRANSESOPHAGEAL ECHOCARDIOGRAM (TEE);  Surgeon: Rudine Cos, MD;  Location: [facility] OR;  Service: Open Heart Surgery;  Laterality: N/A;   TRANSPERINEAL IMPLANT OF RADIATION SEEDS W/ ULTRASOUND      SOCIAL HISTORY: Social History   Socioeconomic History   Marital status: Married    Spouse name: Not on file   Number of children: Not on file   Years of education: Not on file   Highest education level: Not on file  Occupational History   Not on file  Tobacco Use   Smoking status: Never   Smokeless tobacco: Never  Vaping Use   Vaping status: Never Used  Substance and Sexual Activity   Alcohol use: No   Drug use: No   Sexual activity: Not  Currently  Other Topics Concern   Not on file  Social History Narrative   Not on file   Social Drivers of Health   Financial Resource Strain: Not on file  Food Insecurity: Not on file  Transportation Needs: Not on file  Physical Activity: Not on file  Stress: Not on file  Social Connections: Not on file  Intimate Partner Violence: Not on file    FAMILY HISTORY: Family History  Problem Relation Age of Onset   Cancer Mother    Hyperlipidemia Mother    Hypertension Mother    Heart attack Father    Heart disease Father        Heart Disease before age 30   Hyperlipidemia Father    Hypertension Father    Diabetes Sister    Heart disease Sister        before age 52   Hypertension Sister    Hyperlipidemia Sister    Diabetes Brother    Heart disease Brother        before age 14   Hyperlipidemia Brother    Hypertension Brother     ALLERGIES:  is allergic to ace inhibitors, albumin (human), beef-derived drug products, gelfoam-jmi sponge, heparin , pork-derived products, sheep-derived products, and tetanus toxoids.  MEDICATIONS:  Current Outpatient Medications  Medication Sig Dispense Refill   amLODipine  (NORVASC ) 10 MG  tablet Take 10 mg by mouth in the morning.     carvedilol  (COREG ) 25 MG tablet Take 12.5 mg by mouth 2 (two) times daily with a meal.     ELIQUIS  5 MG TABS tablet TAKE 1 TABLET BY MOUTH TWICE A DAY 60 tablet 5   EPINEPHrine  (EPIPEN  2-PAK) 0.3 mg/0.3 mL IJ SOAJ injection Use as directed for life-threatening allergic reaction. 2 each 3   ezetimibe  (ZETIA ) 10 MG tablet TAKE 1 TABLET BY MOUTH EVERY DAY 90 tablet 1   fluticasone (FLONASE) 50 MCG/ACT nasal spray Place 2 sprays into both nostrils daily.     glimepiride (AMARYL) 4 MG tablet Take 4 mg by mouth daily with breakfast.     metFORMIN  (GLUCOPHAGE ) 1000 MG tablet Take 1,000 mg by mouth 2 (two) times daily with a meal.     oxyCODONE -acetaminophen  (PERCOCET) 5-325 MG tablet Take 1 tablet by mouth every 6 (six)  hours as needed for severe pain or moderate pain (post-operatively). (Patient not taking: Reported on 07/19/2023) 10 tablet 0   pantoprazole  (PROTONIX ) 40 MG tablet TAKE 1 TABLET BY MOUTH EVERY DAY 90 tablet 1   polyethylene glycol (MIRALAX / GLYCOLAX) 17 g packet Take 17 g by mouth daily as needed (constipation).     rosuvastatin  (CRESTOR ) 40 MG tablet Take 1 tablet (40 mg total) by mouth daily. 90 tablet 3   senna-docusate (SENOKOT-S) 8.6-50 MG tablet Take 1 tablet by mouth 2 (two) times daily. While taking stong pain meds to prevent constipaiton 10 tablet 0   Tamsulosin  HCl (FLOMAX ) 0.4 MG CAPS Take 0.8 mg by mouth at bedtime.     traZODone  (DESYREL ) 50 MG tablet Take 50 mg by mouth at bedtime as needed for sleep.     Trospium  Chloride 60 MG CP24 Take 1 capsule by mouth daily.     valsartan  (DIOVAN ) 160 MG tablet Take 1 tablet (160 mg total) by mouth daily. 90 tablet 3   No current facility-administered medications for this visit.    REVIEW OF SYSTEMS:   Constitutional: ( - ) fevers, ( - )  chills , ( - ) night sweats Eyes: ( - ) blurriness of vision, ( - ) double vision, ( - ) watery eyes Ears, nose, mouth, throat, and face: ( - ) mucositis, ( - ) sore throat Respiratory: ( - ) cough, ( - ) dyspnea, ( - ) wheezes Cardiovascular: ( - ) palpitation, ( - ) chest discomfort, ( - ) lower extremity swelling Gastrointestinal:  ( - ) nausea, ( - ) heartburn, ( - ) change in bowel habits Skin: ( - ) abnormal skin rashes Lymphatics: ( - ) new lymphadenopathy, ( - ) easy bruising Neurological: ( - ) numbness, ( - ) tingling, ( - ) new weaknesses Behavioral/Psych: ( - ) mood change, ( - ) new changes  All other systems were reviewed with the patient and are negative.  PHYSICAL EXAMINATION: ECOG PERFORMANCE STATUS: 0 - Asymptomatic  Vitals:   10/11/23 1433  BP: (!) 165/84  Pulse: 66  Resp: 13  Temp: 98.1 F (36.7 C)  SpO2: 98%     Filed Weights   10/11/23 1433  Weight: 193 lb 8 oz  (87.8 kg)      GENERAL: well appearing Caucasian male in NAD  SKIN: skin color, texture, turgor are normal, no rashes or significant lesions EYES: conjunctiva are pink and non-injected, sclera clear LUNGS: clear to auscultation and percussion with normal breathing effort HEART: regular rate & rhythm  and no murmurs and no lower extremity edema Musculoskeletal: no cyanosis of digits and no clubbing  PSYCH: alert & oriented x 3, fluent speech NEURO: no focal motor/sensory deficits  LABORATORY DATA:  I have reviewed the data as listed    Latest Ref Rng & Units 10/11/2023    2:06 PM 09/13/2023    2:59 PM 08/16/2023    2:23 PM  CBC  WBC 4.0 - 10.5 K/uL 9.1  8.4  10.1   Hemoglobin 13.0 - 17.0 g/dL 54.0  98.1  19.1   Hematocrit 39.0 - 52.0 % 38.0  40.4  42.2   Platelets 150 - 400 K/uL 142  145  172        Latest Ref Rng & Units 10/11/2023    2:06 PM 09/13/2023    2:59 PM 08/16/2023    2:23 PM  CMP  Glucose 70 - 99 mg/dL 478  295  621   BUN 8 - 23 mg/dL 16  14  16    Creatinine 0.61 - 1.24 mg/dL 3.08  6.57  8.46   Sodium 135 - 145 mmol/L 138  138  138   Potassium 3.5 - 5.1 mmol/L 4.2  4.4  4.1   Chloride 98 - 111 mmol/L 107  105  104   CO2 22 - 32 mmol/L 26  30  29    Calcium  8.9 - 10.3 mg/dL 9.1  9.3  9.4   Total Protein 6.5 - 8.1 g/dL 6.4  6.6  6.5   Total Bilirubin 0.0 - 1.2 mg/dL 0.8  0.7  0.9   Alkaline Phos 38 - 126 U/L 62  61  65   AST 15 - 41 U/L 15  13  19    ALT 0 - 44 U/L 13  10  14       RADIOGRAPHIC STUDIES: No results found.  ASSESSMENT & PLAN Daniel Gibson is a 73 y.o. male who presents for a follow up for newly diagnosed well differentiated neuroendocrine carcinoma, low grade.   #Well differentiated neuroendocrine carcinoma, low grade: --CT guided peritoneal mass biopsy on 01/18/2022 confirmed above diagnosis. --PET DOTATATE was completed on 01/30/2022, report pending --Recommend monthly lanreotide injection q 28 days. Reviewed dose, frequency and common side  effects including injection site reaction, cholelithiasis, gallbladder sludge, hyperglycemia, hypoglycemia, thyroid dysfunction and bradycardia.  --Labs from today were reviewed and require no intervention. Baseline Chromogranin levels were 159.7 on 02/01/2022.  PLAN: --Continue with monthly lanreotide injection q 28 days. --CBC and CMP every month before treatment. Chromagranin levels every 3 months --labs today show white blood cell 9.1, hemoglobin 13.8, MCV 86.2, platelets 142 and AST and ALT were within normal limits. --chromogranin A last 71.8 on 10/11/2023 --last CT scan on 06/13/2023 showed stable disease with no evidence of progression. Noted to have evidence of treatment response in visceral organs . Renal mass stable.  --Urology following for renal mass, details noted below.  --RTC in 3 months for a toxicity evaluation and continued follow up. Inteval CT scan   # Papillary Carcinoma of the Kidney  --biopsy of renal mass performed on 01/26/2023, showed papillary renal cell carcinoma, solid type 1, nuclear grade 2  --urology declined surgery at this time in favor of surveillance  --continue to monitor with serial CT scans.   Orders Placed This Encounter  Procedures   CT CHEST ABDOMEN PELVIS W CONTRAST    Standing Status:   Future    Expected Date:   12/10/2023    Expiration Date:  10/13/2024    If indicated for the ordered procedure, I authorize the administration of contrast media per Radiology protocol:   Yes    Does the patient have a contrast media/X-ray dye allergy?:   No    Preferred imaging location?:   [facility]    If indicated for the ordered procedure, I authorize the administration of oral contrast media per Radiology protocol:   Yes    All questions were answered. The patient knows to call the clinic with any problems, questions or concerns.  I have spent a total of 30 minutes minutes of face-to-face and non-face-to-face time, preparing to see the patient,   performing a medically appropriate examination, counseling and educating the patient, ordering tests/procedures,documenting clinical information in the electronic health record, independently interpreting results and communicating results to the patient, and care coordination.  Rogerio Clay, MD Department of Hematology/Oncology [facility] Phone: [phone] Pager: [phone] Email: [email or web address]

## 2023-10-12 LAB — CHROMOGRANIN A: Chromogranin A (ng/mL): 71.8 ng/mL (ref 0.0–101.8)

## 2023-10-14 ENCOUNTER — Encounter: Payer: Self-pay | Admitting: Hematology and Oncology

## 2023-10-26 DIAGNOSIS — E1129 Type 2 diabetes mellitus with other diabetic kidney complication: Secondary | ICD-10-CM | POA: Diagnosis not present

## 2023-10-26 DIAGNOSIS — E782 Mixed hyperlipidemia: Secondary | ICD-10-CM | POA: Diagnosis not present

## 2023-10-29 DIAGNOSIS — I10 Essential (primary) hypertension: Secondary | ICD-10-CM | POA: Diagnosis not present

## 2023-10-29 DIAGNOSIS — R809 Proteinuria, unspecified: Secondary | ICD-10-CM | POA: Diagnosis not present

## 2023-10-29 DIAGNOSIS — C786 Secondary malignant neoplasm of retroperitoneum and peritoneum: Secondary | ICD-10-CM | POA: Diagnosis not present

## 2023-10-29 DIAGNOSIS — G4700 Insomnia, unspecified: Secondary | ICD-10-CM | POA: Diagnosis not present

## 2023-10-29 DIAGNOSIS — E782 Mixed hyperlipidemia: Secondary | ICD-10-CM | POA: Diagnosis not present

## 2023-10-29 DIAGNOSIS — D696 Thrombocytopenia, unspecified: Secondary | ICD-10-CM | POA: Diagnosis not present

## 2023-10-29 DIAGNOSIS — J309 Allergic rhinitis, unspecified: Secondary | ICD-10-CM | POA: Diagnosis not present

## 2023-10-29 DIAGNOSIS — C641 Malignant neoplasm of right kidney, except renal pelvis: Secondary | ICD-10-CM | POA: Diagnosis not present

## 2023-10-29 DIAGNOSIS — Z23 Encounter for immunization: Secondary | ICD-10-CM | POA: Diagnosis not present

## 2023-10-29 DIAGNOSIS — S7002XA Contusion of left hip, initial encounter: Secondary | ICD-10-CM | POA: Diagnosis not present

## 2023-10-29 DIAGNOSIS — E1129 Type 2 diabetes mellitus with other diabetic kidney complication: Secondary | ICD-10-CM | POA: Diagnosis not present

## 2023-11-08 ENCOUNTER — Inpatient Hospital Stay: Payer: Medicare HMO

## 2023-11-08 ENCOUNTER — Inpatient Hospital Stay: Payer: Medicare HMO | Attending: Physician Assistant

## 2023-11-08 VITALS — BP 156/69 | HR 66 | Temp 99.3°F | Resp 16

## 2023-11-08 DIAGNOSIS — C7A8 Other malignant neuroendocrine tumors: Secondary | ICD-10-CM

## 2023-11-08 DIAGNOSIS — C7B8 Other secondary neuroendocrine tumors: Secondary | ICD-10-CM | POA: Insufficient documentation

## 2023-11-08 LAB — CBC WITH DIFFERENTIAL (CANCER CENTER ONLY)
Abs Immature Granulocytes: 0.03 10*3/uL (ref 0.00–0.07)
Basophils Absolute: 0.1 10*3/uL (ref 0.0–0.1)
Basophils Relative: 1 %
Eosinophils Absolute: 0.4 10*3/uL (ref 0.0–0.5)
Eosinophils Relative: 4 %
HCT: 34.5 % — ABNORMAL LOW (ref 39.0–52.0)
Hemoglobin: 12.3 g/dL — ABNORMAL LOW (ref 13.0–17.0)
Immature Granulocytes: 0 %
Lymphocytes Relative: 8 %
Lymphs Abs: 0.6 10*3/uL — ABNORMAL LOW (ref 0.7–4.0)
MCH: 31.5 pg (ref 26.0–34.0)
MCHC: 35.7 g/dL (ref 30.0–36.0)
MCV: 88.2 fL (ref 80.0–100.0)
Monocytes Absolute: 0.9 10*3/uL (ref 0.1–1.0)
Monocytes Relative: 12 %
Neutro Abs: 6 10*3/uL (ref 1.7–7.7)
Neutrophils Relative %: 75 %
Platelet Count: 151 10*3/uL (ref 150–400)
RBC: 3.91 MIL/uL — ABNORMAL LOW (ref 4.22–5.81)
RDW: 12.9 % (ref 11.5–15.5)
WBC Count: 8 10*3/uL (ref 4.0–10.5)
nRBC: 0 % (ref 0.0–0.2)

## 2023-11-08 LAB — CMP (CANCER CENTER ONLY)
ALT: 12 U/L (ref 0–44)
AST: 16 U/L (ref 15–41)
Albumin: 4.2 g/dL (ref 3.5–5.0)
Alkaline Phosphatase: 53 U/L (ref 38–126)
Anion gap: 4 — ABNORMAL LOW (ref 5–15)
BUN: 19 mg/dL (ref 8–23)
CO2: 25 mmol/L (ref 22–32)
Calcium: 9 mg/dL (ref 8.9–10.3)
Chloride: 107 mmol/L (ref 98–111)
Creatinine: 1.45 mg/dL — ABNORMAL HIGH (ref 0.61–1.24)
GFR, Estimated: 51 mL/min — ABNORMAL LOW (ref >60)
Glucose, Bld: 185 mg/dL — ABNORMAL HIGH (ref 70–99)
Potassium: 4.4 mmol/L (ref 3.5–5.1)
Sodium: 136 mmol/L (ref 135–145)
Total Bilirubin: 0.8 mg/dL (ref 0.0–1.2)
Total Protein: 6.4 g/dL — ABNORMAL LOW (ref 6.5–8.1)

## 2023-11-08 MED ORDER — LANREOTIDE ACETATE 120 MG/0.5ML ~~LOC~~ SOLN
120.0000 mg | Freq: Once | SUBCUTANEOUS | Status: AC
  Administered 2023-11-08: 120 mg via SUBCUTANEOUS
  Filled 2023-11-08: qty 120

## 2023-11-10 LAB — CHROMOGRANIN A: Chromogranin A (ng/mL): 79.8 ng/mL (ref 0.0–101.8)

## 2023-12-06 ENCOUNTER — Inpatient Hospital Stay

## 2023-12-06 ENCOUNTER — Inpatient Hospital Stay: Attending: Physician Assistant

## 2023-12-06 ENCOUNTER — Inpatient Hospital Stay: Admitting: Hematology and Oncology

## 2023-12-06 VITALS — BP 157/79 | HR 63 | Temp 97.7°F | Resp 13 | Wt 193.5 lb

## 2023-12-06 DIAGNOSIS — C7B8 Other secondary neuroendocrine tumors: Secondary | ICD-10-CM

## 2023-12-06 DIAGNOSIS — C7A8 Other malignant neuroendocrine tumors: Secondary | ICD-10-CM

## 2023-12-06 LAB — CBC WITH DIFFERENTIAL (CANCER CENTER ONLY)
Abs Immature Granulocytes: 0.02 10*3/uL (ref 0.00–0.07)
Basophils Absolute: 0.1 10*3/uL (ref 0.0–0.1)
Basophils Relative: 1 %
Eosinophils Absolute: 0.5 10*3/uL (ref 0.0–0.5)
Eosinophils Relative: 5 %
HCT: 37.7 % — ABNORMAL LOW (ref 39.0–52.0)
Hemoglobin: 13.2 g/dL (ref 13.0–17.0)
Immature Granulocytes: 0 %
Lymphocytes Relative: 7 %
Lymphs Abs: 0.7 10*3/uL (ref 0.7–4.0)
MCH: 31.1 pg (ref 26.0–34.0)
MCHC: 35 g/dL (ref 30.0–36.0)
MCV: 88.7 fL (ref 80.0–100.0)
Monocytes Absolute: 0.7 10*3/uL (ref 0.1–1.0)
Monocytes Relative: 8 %
Neutro Abs: 7.1 10*3/uL (ref 1.7–7.7)
Neutrophils Relative %: 79 %
Platelet Count: 150 10*3/uL (ref 150–400)
RBC: 4.25 MIL/uL (ref 4.22–5.81)
RDW: 12.6 % (ref 11.5–15.5)
WBC Count: 9.1 10*3/uL (ref 4.0–10.5)
nRBC: 0 % (ref 0.0–0.2)

## 2023-12-06 LAB — CMP (CANCER CENTER ONLY)
ALT: 14 U/L (ref 0–44)
AST: 17 U/L (ref 15–41)
Albumin: 4.1 g/dL (ref 3.5–5.0)
Alkaline Phosphatase: 50 U/L (ref 38–126)
Anion gap: 11 (ref 5–15)
BUN: 16 mg/dL (ref 8–23)
CO2: 20 mmol/L — ABNORMAL LOW (ref 22–32)
Calcium: 8.9 mg/dL (ref 8.9–10.3)
Chloride: 109 mmol/L (ref 98–111)
Creatinine: 1.09 mg/dL (ref 0.61–1.24)
GFR, Estimated: >60 mL/min (ref >60)
Glucose, Bld: 155 mg/dL — ABNORMAL HIGH (ref 70–99)
Potassium: 3.7 mmol/L (ref 3.5–5.1)
Sodium: 140 mmol/L (ref 135–145)
Total Bilirubin: 0.7 mg/dL (ref 0.0–1.2)
Total Protein: 6.4 g/dL — ABNORMAL LOW (ref 6.5–8.1)

## 2023-12-06 MED ORDER — LANREOTIDE ACETATE 120 MG/0.5ML ~~LOC~~ SOLN
120.0000 mg | Freq: Once | SUBCUTANEOUS | Status: AC
  Administered 2023-12-06: 120 mg via SUBCUTANEOUS
  Filled 2023-12-06: qty 120

## 2023-12-06 NOTE — Progress Notes (Signed)
 [facility] [phone]   [phone]  PROGRESS NOTE:  Patient Care Team: Annette Barters, MD as PCP - General (Family Medicine) Kay Ricciuti Gault, MD as Attending Physician (Cardiology) Opal Bill, MD (Inactive) as Attending Physician (Radiation Oncology) Ann Barnacle, MD (Inactive) as Attending Physician (Urology)  CHIEF COMPLAINTS/PURPOSE OF CONSULTATION:  Right renal mass and peritoneal nodularity  ONCOLOGIC HISTORY: Presented to the [facility] ER on 12/11/2021 for painless hematuria and urinary urgency.  Work-up included urinalysis showed large leukocyte esterase, blood, protein, positive nitrates.  Urine culture grew E. coli.  Patient was treated with cefdinir.  He underwent CT imaging that showed a nonobstructing 6 mm stone in the interpolar left kidney, indeterminate 2.9 x 3.4 x 4.3 cm lesion in the upper pole right kidney, multiple peritoneal and omental nodules concerning for peritoneal carcinomatosis. 01/10/2022: CT chest: Incidental right lower lobe segmental and subsegmental pulmonary emboli.Minimal clot burden without right heart strain.No evidence of intrathoracic metastases.Small focus of ground-glass airspace disease within the right upper lobe likely inflammatory or infectious. Started on Eliquis  therapy 01/18/2022: CT guided biopsy of peritoneal mass: Well-differentiated low-grade neuroendocrine neoplasm with features  suspicious carcinoid tumor. Ki-67 shows very low proliferative index score.  01/30/2022: PET DOTATATE showed extensive tracer avid peritoneal metastasis and tracer avid liver metastasis to the posterior right hepatic lobe. 02/01/2022: Start lanreotide monthly injections 6.   01/26/2023: CT renal biopsy showed low grade papillary carcinoma of the kidney. Urology opted for surveillance rather than surgery.   INTERIM HISTORY:  Daniel Gibson returns today for a follow up visit for a low grade neuroendocrine  carcinoma.   On exam today Daniel Gibson reports that he has been well overall in interim since our last visit.  He reports that his energy is about a 9 out of 10 today.  He notes that he did have an episode of nausea and vomiting recently, but that this is uncommon.  He reports that he has been losing some weight and is down about 2 to 3 pounds since the start of the year.  He does feel like his appetite has been quite good.  He notes that he has not had any issues with diarrhea or constipation.  He has not had any abdominal pain or discomfort.  Overall he feels well and is willing and able to proceed with treatment today.  He denies any fevers, chills, sweats, runny nose, sore throat, cough.  A full 10 point ROS was otherwise negative.  MEDICAL HISTORY:  Past Medical History:  Diagnosis Date   Abnormal cardiac CT angiography    Allergy to alpha-gal 12/27/2021   CAD (coronary artery disease), native coronary artery 04/08/2018   Cath 10/1991 in setting of inferior MI  [facility]      normal Left main, scattered irregularities LAD, 40% stenosis proximal Ramus, occluded OM 2, 60% stenosis proximal PDA, dominant CFX, small and nondominant RCA     Carotid artery disease (HCC) 05/08/2012   CEA on left 2013   Coronary artery disease    Diabetes mellitus type 2, noninsulin dependent (HCC) 04/08/2018   Diabetes mellitus without complication (HCC)    Food allergy    Alpha Gal allergy    GERD (gastroesophageal reflux disease)    Hyperlipidemia    Hypertensive heart disease without CHF 04/08/2018   Myocardial infarction [facility]) 1993   medically managed   Neuroendocrine carcinoma metastatic to liver [facility])    Neuroendocrine carcinoma metastatic to multiple sites (HCC) 01/26/2022  Obesity (BMI 30-39.9) 04/08/2018   Old myocardial infarct 04/08/2018   Peritoneal lesion 01/04/2022   Prostate cancer (HCC) 08/07/2016   Pulmonary embolism (HCC)    PVC's (premature ventricular contractions) 04/08/2018    Pyelonephritis 12/27/2021   Renal calculus, left 12/27/2021   S/P CABG x 3 10/18/2020   Thrombocytopenia (HCC)     SURGICAL HISTORY: Past Surgical History:  Procedure Laterality Date   APPENDECTOMY     CAROTID ENDARTERECTOMY Right 06-07-12   cea   CORONARY ARTERY BYPASS GRAFT N/A 10/18/2020   Procedure: CORONARY ARTERY BYPASS GRAFTING (CABG)x3. LEFT INTERNAL MAMMARY ARTERY. RIGHT ENDOSCOPIC SAPHENOUS VEIN HARVESTING.;  Surgeon: Rudine Cos, MD;  Location: [facility] OR;  Service: Open Heart Surgery;  Laterality: N/A;   CYSTOSCOPY WITH RETROGRADE PYELOGRAM, URETEROSCOPY AND STENT PLACEMENT Right 01/10/2023   Procedure: CYSTOSCOPY WITH RETROGRADE PYELOGRAM, RIGHT DIAGNOSTIC URETEROSCOPY;  Surgeon: Melody Spurling., MD;  Location: [facility] ORS;  Service: Urology;  Laterality: Right;   ENDARTERECTOMY  06/07/2012   Procedure: ENDARTERECTOMY CAROTID;  Surgeon: Dannis Dy, MD;  Location: [facility] OR;  Service: Vascular;  Laterality: Right;  Right Carotid Endarterectomy with Patch Angioplasty   LEFT HEART CATH AND CORONARY ANGIOGRAPHY N/A 10/07/2020   Procedure: LEFT HEART CATH AND CORONARY ANGIOGRAPHY;  Surgeon: Millicent Ally, MD;  Location: [facility] INVASIVE CV LAB;  Service: Cardiovascular;  Laterality: N/A;   TEE WITHOUT CARDIOVERSION N/A 10/18/2020   Procedure: TRANSESOPHAGEAL ECHOCARDIOGRAM (TEE);  Surgeon: Rudine Cos, MD;  Location: [facility] OR;  Service: Open Heart Surgery;  Laterality: N/A;   TRANSPERINEAL IMPLANT OF RADIATION SEEDS W/ ULTRASOUND      SOCIAL HISTORY: Social History   Socioeconomic History   Marital status: Married    Spouse name: Not on file   Number of children: Not on file   Years of education: Not on file   Highest education level: Not on file  Occupational History   Not on file  Tobacco Use   Smoking status: Never   Smokeless tobacco: Never  Vaping Use   Vaping status: Never Used  Substance and Sexual Activity   Alcohol use: No   Drug use: No   Sexual  activity: Not Currently  Other Topics Concern   Not on file  Social History Narrative   Not on file   Social Drivers of Health   Financial Resource Strain: Not on file  Food Insecurity: Not on file  Transportation Needs: Not on file  Physical Activity: Not on file  Stress: Not on file  Social Connections: Not on file  Intimate Partner Violence: Not on file    FAMILY HISTORY: Family History  Problem Relation Age of Onset   Cancer Mother    Hyperlipidemia Mother    Hypertension Mother    Heart attack Father    Heart disease Father        Heart Disease before age 62   Hyperlipidemia Father    Hypertension Father    Diabetes Sister    Heart disease Sister        before age 66   Hypertension Sister    Hyperlipidemia Sister    Diabetes Brother    Heart disease Brother        before age 50   Hyperlipidemia Brother    Hypertension Brother     ALLERGIES:  is allergic to ace inhibitors, albumin (human), beef-derived drug products, gelfoam-jmi sponge, heparin , pork-derived products, sheep-derived products, and tetanus toxoids.  MEDICATIONS:  Current Outpatient Medications  Medication Sig  Dispense Refill   amLODipine  (NORVASC ) 10 MG tablet Take 10 mg by mouth in the morning.     carvedilol  (COREG ) 25 MG tablet Take 12.5 mg by mouth 2 (two) times daily with a meal.     ELIQUIS  5 MG TABS tablet TAKE 1 TABLET BY MOUTH TWICE A DAY 60 tablet 5   EPINEPHrine  (EPIPEN  2-PAK) 0.3 mg/0.3 mL IJ SOAJ injection Use as directed for life-threatening allergic reaction. 2 each 3   ezetimibe  (ZETIA ) 10 MG tablet TAKE 1 TABLET BY MOUTH EVERY DAY 90 tablet 1   fluticasone (FLONASE) 50 MCG/ACT nasal spray Place 2 sprays into both nostrils daily.     glimepiride (AMARYL) 4 MG tablet Take 4 mg by mouth daily with breakfast.     metFORMIN  (GLUCOPHAGE ) 1000 MG tablet Take 1,000 mg by mouth 2 (two) times daily with a meal.     oxyCODONE -acetaminophen  (PERCOCET) 5-325 MG tablet Take 1 tablet by mouth  every 6 (six) hours as needed for severe pain or moderate pain (post-operatively). (Patient not taking: Reported on 07/19/2023) 10 tablet 0   pantoprazole  (PROTONIX ) 40 MG tablet TAKE 1 TABLET BY MOUTH EVERY DAY 90 tablet 1   polyethylene glycol (MIRALAX / GLYCOLAX) 17 g packet Take 17 g by mouth daily as needed (constipation).     rosuvastatin  (CRESTOR ) 40 MG tablet Take 1 tablet (40 mg total) by mouth daily. 90 tablet 3   senna-docusate (SENOKOT-S) 8.6-50 MG tablet Take 1 tablet by mouth 2 (two) times daily. While taking stong pain meds to prevent constipaiton 10 tablet 0   Tamsulosin  HCl (FLOMAX ) 0.4 MG CAPS Take 0.8 mg by mouth at bedtime.     traZODone  (DESYREL ) 50 MG tablet Take 50 mg by mouth at bedtime as needed for sleep.     Trospium  Chloride 60 MG CP24 Take 1 capsule by mouth daily.     valsartan  (DIOVAN ) 160 MG tablet Take 1 tablet (160 mg total) by mouth daily. 90 tablet 3   No current facility-administered medications for this visit.    REVIEW OF SYSTEMS:   Constitutional: ( - ) fevers, ( - )  chills , ( - ) night sweats Eyes: ( - ) blurriness of vision, ( - ) double vision, ( - ) watery eyes Ears, nose, mouth, throat, and face: ( - ) mucositis, ( - ) sore throat Respiratory: ( - ) cough, ( - ) dyspnea, ( - ) wheezes Cardiovascular: ( - ) palpitation, ( - ) chest discomfort, ( - ) lower extremity swelling Gastrointestinal:  ( - ) nausea, ( - ) heartburn, ( - ) change in bowel habits Skin: ( - ) abnormal skin rashes Lymphatics: ( - ) new lymphadenopathy, ( - ) easy bruising Neurological: ( - ) numbness, ( - ) tingling, ( - ) new weaknesses Behavioral/Psych: ( - ) mood change, ( - ) new changes  All other systems were reviewed with the patient and are negative.  PHYSICAL EXAMINATION: ECOG PERFORMANCE STATUS: 0 - Asymptomatic  Vitals:   12/06/23 1501  BP: (!) 157/79  Pulse: 63  Resp: 13  Temp: 97.7 F (36.5 C)  SpO2: 97%   Filed Weights   12/06/23 1501  Weight: 193  lb 8 oz (87.8 kg)   GENERAL: well appearing Caucasian male in NAD  SKIN: skin color, texture, turgor are normal, no rashes or significant lesions EYES: conjunctiva are pink and non-injected, sclera clear LUNGS: clear to auscultation and percussion with normal breathing effort HEART: regular  rate & rhythm and no murmurs and no lower extremity edema Musculoskeletal: no cyanosis of digits and no clubbing  PSYCH: alert & oriented x 3, fluent speech NEURO: no focal motor/sensory deficits  LABORATORY DATA:  I have reviewed the data as listed    Latest Ref Rng & Units 12/06/2023    2:37 PM 11/08/2023    2:38 PM 10/11/2023    2:06 PM  CBC  WBC 4.0 - 10.5 K/uL 9.1  8.0  9.1   Hemoglobin 13.0 - 17.0 g/dL 78.2  95.6  21.3   Hematocrit 39.0 - 52.0 % 37.7  34.5  38.0   Platelets 150 - 400 K/uL 150  151  142        Latest Ref Rng & Units 12/06/2023    2:37 PM 11/08/2023    2:38 PM 10/11/2023    2:06 PM  CMP  Glucose 70 - 99 mg/dL 086  578  469   BUN 8 - 23 mg/dL 16  19  16    Creatinine 0.61 - 1.24 mg/dL 6.29  5.28  4.13   Sodium 135 - 145 mmol/L 140  136  138   Potassium 3.5 - 5.1 mmol/L 3.7  4.4  4.2   Chloride 98 - 111 mmol/L 109  107  107   CO2 22 - 32 mmol/L 20  25  26    Calcium  8.9 - 10.3 mg/dL 8.9  9.0  9.1   Total Protein 6.5 - 8.1 g/dL 6.4  6.4  6.4   Total Bilirubin 0.0 - 1.2 mg/dL 0.7  0.8  0.8   Alkaline Phos 38 - 126 U/L 50  53  62   AST 15 - 41 U/L 17  16  15    ALT 0 - 44 U/L 14  12  13       RADIOGRAPHIC STUDIES: No results found.  ASSESSMENT & PLAN DECKLYN HYDER is a 73 y.o. male who presents for a follow up for newly diagnosed well differentiated neuroendocrine carcinoma, low grade.   #Well differentiated neuroendocrine carcinoma, low grade: --CT guided peritoneal mass biopsy on 01/18/2022 confirmed above diagnosis. --PET DOTATATE was completed on 01/30/2022, report pending --Recommend monthly lanreotide injection q 28 days. Reviewed dose, frequency and common side  effects including injection site reaction, cholelithiasis, gallbladder sludge, hyperglycemia, hypoglycemia, thyroid dysfunction and bradycardia.  --Labs from today were reviewed and require no intervention. Baseline Chromogranin levels were 159.7 on 02/01/2022.  PLAN: --Continue with monthly lanreotide injection q 28 days. --CBC and CMP every month before treatment. Chromagranin levels every 3 months --labs today show white blood cell 9.1, Hgb 13.2, MCV 88.7, Plt 150. and AST and ALT were within normal limits. --chromogranin A last 71.8 on 10/11/2023 --last CT scan on 06/13/2023 showed stable disease with no evidence of progression. Noted to have evidence of treatment response in visceral organs . Renal mass stable. Next scan 12/10/2023.  --Urology following for renal mass, details noted below.  --RTC in 3 months for a toxicity evaluation and continued follow up. Interval monthly lanreotide injections.   # Papillary Carcinoma of the Kidney  --biopsy of renal mass performed on 01/26/2023, showed papillary renal cell carcinoma, solid type 1, nuclear grade 2  --urology declined surgery at this time in favor of surveillance  --continue to monitor with serial CT scans.   No orders of the defined types were placed in this encounter.   All questions were answered. The patient knows to call the clinic with any problems, questions or concerns.  I have spent a total of 30 minutes minutes of face-to-face and non-face-to-face time, preparing to see the patient,  performing a medically appropriate examination, counseling and educating the patient, ordering tests/procedures,documenting clinical information in the electronic health record, independently interpreting results and communicating results to the patient, and care coordination.  Rogerio Clay, MD Department of Hematology/Oncology [facility] Phone: [phone] Pager: [phone] Email:  [email or web address]

## 2023-12-08 LAB — CHROMOGRANIN A: Chromogranin A (ng/mL): 80.8 ng/mL (ref 0.0–101.8)

## 2023-12-10 ENCOUNTER — Encounter: Payer: Self-pay | Admitting: Hematology and Oncology

## 2023-12-10 ENCOUNTER — Ambulatory Visit (HOSPITAL_COMMUNITY)
Admission: RE | Admit: 2023-12-10 | Discharge: 2023-12-10 | Disposition: A | Discharge status: Home / Self Care | Source: Ambulatory Visit | Attending: Hematology and Oncology | Admitting: Hematology and Oncology

## 2023-12-10 DIAGNOSIS — C7B8 Other secondary neuroendocrine tumors: Secondary | ICD-10-CM | POA: Diagnosis not present

## 2023-12-10 DIAGNOSIS — C787 Secondary malignant neoplasm of liver and intrahepatic bile duct: Secondary | ICD-10-CM | POA: Diagnosis not present

## 2023-12-10 DIAGNOSIS — I700 Atherosclerosis of aorta: Secondary | ICD-10-CM | POA: Diagnosis not present

## 2023-12-10 DIAGNOSIS — C7A8 Other malignant neuroendocrine tumors: Secondary | ICD-10-CM | POA: Diagnosis not present

## 2023-12-10 DIAGNOSIS — C786 Secondary malignant neoplasm of retroperitoneum and peritoneum: Secondary | ICD-10-CM | POA: Diagnosis not present

## 2023-12-10 MED ORDER — SODIUM CHLORIDE (PF) 0.9 % IJ SOLN
INTRAMUSCULAR | Status: AC
  Filled 2023-12-10: qty 50

## 2023-12-10 MED ORDER — IOHEXOL 300 MG/ML  SOLN
100.0000 mL | Freq: Once | INTRAMUSCULAR | Status: AC | PRN
  Administered 2023-12-10: 100 mL via INTRAVENOUS

## 2023-12-11 NOTE — Progress Notes (Signed)
 Contacted pt per Dr Rosaline Coma to: let Mr. Sar know that his CT scan showed no concern for progression of disease. Overall his disease is stable. We will continue on his lanreotide shots monthly as scheduled.  Pt acknowledged information and verbalized understanding. Pt verbalized some concern about the reading regarding his liver. Will let Dr Rosaline Coma know about pt's concern.

## 2023-12-16 ENCOUNTER — Encounter: Payer: Self-pay | Admitting: Hematology and Oncology

## 2023-12-24 ENCOUNTER — Telehealth: Payer: Self-pay | Admitting: *Deleted

## 2023-12-24 NOTE — Telephone Encounter (Signed)
 Referral for 2nd opinion to Dr. Ozell Seat @ [facility] fax'd . Confirmation of fax received.

## 2023-12-26 ENCOUNTER — Encounter: Payer: Self-pay | Admitting: Family Medicine

## 2023-12-27 DIAGNOSIS — C7A8 Other malignant neuroendocrine tumors: Secondary | ICD-10-CM | POA: Diagnosis not present

## 2023-12-27 DIAGNOSIS — D3A8 Other benign neuroendocrine tumors: Secondary | ICD-10-CM | POA: Diagnosis not present

## 2023-12-27 DIAGNOSIS — C641 Malignant neoplasm of right kidney, except renal pelvis: Secondary | ICD-10-CM | POA: Diagnosis not present

## 2024-01-03 ENCOUNTER — Inpatient Hospital Stay: Attending: Physician Assistant

## 2024-01-03 ENCOUNTER — Inpatient Hospital Stay

## 2024-01-03 VITALS — BP 140/76 | HR 64 | Temp 98.7°F | Resp 15

## 2024-01-03 DIAGNOSIS — C7A8 Other malignant neuroendocrine tumors: Secondary | ICD-10-CM | POA: Diagnosis not present

## 2024-01-03 DIAGNOSIS — C7B8 Other secondary neuroendocrine tumors: Secondary | ICD-10-CM | POA: Diagnosis not present

## 2024-01-03 LAB — CBC WITH DIFFERENTIAL (CANCER CENTER ONLY)
Abs Immature Granulocytes: 0.02 K/uL (ref 0.00–0.07)
Basophils Absolute: 0.1 K/uL (ref 0.0–0.1)
Basophils Relative: 1 %
Eosinophils Absolute: 0.6 K/uL — ABNORMAL HIGH (ref 0.0–0.5)
Eosinophils Relative: 6 %
HCT: 37.7 % — ABNORMAL LOW (ref 39.0–52.0)
Hemoglobin: 13.1 g/dL (ref 13.0–17.0)
Immature Granulocytes: 0 %
Lymphocytes Relative: 6 %
Lymphs Abs: 0.6 K/uL — ABNORMAL LOW (ref 0.7–4.0)
MCH: 30.7 pg (ref 26.0–34.0)
MCHC: 34.7 g/dL (ref 30.0–36.0)
MCV: 88.3 fL (ref 80.0–100.0)
Monocytes Absolute: 0.8 K/uL (ref 0.1–1.0)
Monocytes Relative: 8 %
Neutro Abs: 7.9 K/uL — ABNORMAL HIGH (ref 1.7–7.7)
Neutrophils Relative %: 79 %
Platelet Count: 148 K/uL — ABNORMAL LOW (ref 150–400)
RBC: 4.27 MIL/uL (ref 4.22–5.81)
RDW: 12.2 % (ref 11.5–15.5)
WBC Count: 9.9 K/uL (ref 4.0–10.5)
nRBC: 0 % (ref 0.0–0.2)

## 2024-01-03 LAB — CMP (CANCER CENTER ONLY)
ALT: 11 U/L (ref 0–44)
AST: 13 U/L — ABNORMAL LOW (ref 15–41)
Albumin: 4.1 g/dL (ref 3.5–5.0)
Alkaline Phosphatase: 55 U/L (ref 38–126)
Anion gap: 5 (ref 5–15)
BUN: 16 mg/dL (ref 8–23)
CO2: 25 mmol/L (ref 22–32)
Calcium: 9 mg/dL (ref 8.9–10.3)
Chloride: 108 mmol/L (ref 98–111)
Creatinine: 1.2 mg/dL (ref 0.61–1.24)
GFR, Estimated: >60 mL/min (ref >60)
Glucose, Bld: 226 mg/dL — ABNORMAL HIGH (ref 70–99)
Potassium: 4 mmol/L (ref 3.5–5.1)
Sodium: 138 mmol/L (ref 135–145)
Total Bilirubin: 0.6 mg/dL (ref 0.0–1.2)
Total Protein: 6.4 g/dL — ABNORMAL LOW (ref 6.5–8.1)

## 2024-01-03 MED ORDER — LANREOTIDE ACETATE 120 MG/0.5ML ~~LOC~~ SOLN
120.0000 mg | Freq: Once | SUBCUTANEOUS | Status: AC
  Administered 2024-01-03: 120 mg via SUBCUTANEOUS
  Filled 2024-01-03: qty 120

## 2024-01-04 LAB — CHROMOGRANIN A: Chromogranin A (ng/mL): 61.9 ng/mL (ref 0.0–101.8)

## 2024-01-14 DIAGNOSIS — N2889 Other specified disorders of kidney and ureter: Secondary | ICD-10-CM | POA: Diagnosis not present

## 2024-01-14 DIAGNOSIS — C7A8 Other malignant neuroendocrine tumors: Secondary | ICD-10-CM | POA: Diagnosis not present

## 2024-01-14 DIAGNOSIS — K669 Disorder of peritoneum, unspecified: Secondary | ICD-10-CM | POA: Diagnosis not present

## 2024-01-14 DIAGNOSIS — C7B8 Other secondary neuroendocrine tumors: Secondary | ICD-10-CM | POA: Diagnosis not present

## 2024-01-14 DIAGNOSIS — D3A8 Other benign neuroendocrine tumors: Secondary | ICD-10-CM | POA: Diagnosis not present

## 2024-01-20 ENCOUNTER — Other Ambulatory Visit: Payer: Self-pay | Admitting: Cardiology

## 2024-01-20 DIAGNOSIS — I6523 Occlusion and stenosis of bilateral carotid arteries: Secondary | ICD-10-CM

## 2024-01-20 DIAGNOSIS — E785 Hyperlipidemia, unspecified: Secondary | ICD-10-CM

## 2024-01-20 DIAGNOSIS — I493 Ventricular premature depolarization: Secondary | ICD-10-CM

## 2024-01-20 DIAGNOSIS — E118 Type 2 diabetes mellitus with unspecified complications: Secondary | ICD-10-CM

## 2024-01-20 DIAGNOSIS — I119 Hypertensive heart disease without heart failure: Secondary | ICD-10-CM

## 2024-01-20 DIAGNOSIS — I25119 Atherosclerotic heart disease of native coronary artery with unspecified angina pectoris: Secondary | ICD-10-CM

## 2024-01-23 DIAGNOSIS — C7B8 Other secondary neuroendocrine tumors: Secondary | ICD-10-CM | POA: Diagnosis not present

## 2024-01-31 ENCOUNTER — Other Ambulatory Visit: Payer: Self-pay | Admitting: Cardiology

## 2024-01-31 ENCOUNTER — Inpatient Hospital Stay

## 2024-01-31 ENCOUNTER — Inpatient Hospital Stay: Attending: Physician Assistant

## 2024-01-31 VITALS — BP 164/81 | HR 57 | Resp 16

## 2024-01-31 DIAGNOSIS — C7A8 Other malignant neuroendocrine tumors: Secondary | ICD-10-CM | POA: Insufficient documentation

## 2024-01-31 LAB — CMP (CANCER CENTER ONLY)
ALT: 12 U/L (ref 0–44)
AST: 15 U/L (ref 15–41)
Albumin: 4.2 g/dL (ref 3.5–5.0)
Alkaline Phosphatase: 55 U/L (ref 38–126)
Anion gap: 6 (ref 5–15)
BUN: 14 mg/dL (ref 8–23)
CO2: 25 mmol/L (ref 22–32)
Calcium: 9.1 mg/dL (ref 8.9–10.3)
Chloride: 108 mmol/L (ref 98–111)
Creatinine: 1.23 mg/dL (ref 0.61–1.24)
GFR, Estimated: >60 mL/min (ref >60)
Glucose, Bld: 109 mg/dL — ABNORMAL HIGH (ref 70–99)
Potassium: 4.1 mmol/L (ref 3.5–5.1)
Sodium: 139 mmol/L (ref 135–145)
Total Bilirubin: 0.6 mg/dL (ref 0.0–1.2)
Total Protein: 6.4 g/dL — ABNORMAL LOW (ref 6.5–8.1)

## 2024-01-31 LAB — CBC WITH DIFFERENTIAL (CANCER CENTER ONLY)
Abs Immature Granulocytes: 0.02 K/uL (ref 0.00–0.07)
Basophils Absolute: 0.1 K/uL (ref 0.0–0.1)
Basophils Relative: 1 %
Eosinophils Absolute: 0.7 K/uL — ABNORMAL HIGH (ref 0.0–0.5)
Eosinophils Relative: 7 %
HCT: 38.2 % — ABNORMAL LOW (ref 39.0–52.0)
Hemoglobin: 13.3 g/dL (ref 13.0–17.0)
Immature Granulocytes: 0 %
Lymphocytes Relative: 8 %
Lymphs Abs: 0.8 K/uL (ref 0.7–4.0)
MCH: 30 pg (ref 26.0–34.0)
MCHC: 34.8 g/dL (ref 30.0–36.0)
MCV: 86 fL (ref 80.0–100.0)
Monocytes Absolute: 0.9 K/uL (ref 0.1–1.0)
Monocytes Relative: 10 %
Neutro Abs: 6.9 K/uL (ref 1.7–7.7)
Neutrophils Relative %: 74 %
Platelet Count: 145 K/uL — ABNORMAL LOW (ref 150–400)
RBC: 4.44 MIL/uL (ref 4.22–5.81)
RDW: 11.9 % (ref 11.5–15.5)
WBC Count: 9.3 K/uL (ref 4.0–10.5)
nRBC: 0 % (ref 0.0–0.2)

## 2024-01-31 MED ORDER — LANREOTIDE ACETATE 120 MG/0.5ML ~~LOC~~ SOLN
120.0000 mg | Freq: Once | SUBCUTANEOUS | Status: AC
  Administered 2024-01-31: 120 mg via SUBCUTANEOUS
  Filled 2024-01-31: qty 120

## 2024-02-02 LAB — CHROMOGRANIN A: Chromogranin A (ng/mL): 71.4 ng/mL (ref 0.0–101.8)

## 2024-02-28 ENCOUNTER — Inpatient Hospital Stay: Attending: Physician Assistant | Admitting: Hematology and Oncology

## 2024-02-28 ENCOUNTER — Inpatient Hospital Stay

## 2024-02-28 VITALS — BP 151/70 | HR 62 | Temp 97.8°F | Resp 13 | Wt 198.0 lb

## 2024-02-28 DIAGNOSIS — C7B8 Other secondary neuroendocrine tumors: Secondary | ICD-10-CM | POA: Diagnosis not present

## 2024-02-28 DIAGNOSIS — C7A8 Other malignant neuroendocrine tumors: Secondary | ICD-10-CM | POA: Diagnosis not present

## 2024-02-28 LAB — CBC WITH DIFFERENTIAL (CANCER CENTER ONLY)
Abs Immature Granulocytes: 0.04 K/uL (ref 0.00–0.07)
Basophils Absolute: 0.1 K/uL (ref 0.0–0.1)
Basophils Relative: 1 %
Eosinophils Absolute: 0.5 K/uL (ref 0.0–0.5)
Eosinophils Relative: 6 %
HCT: 39.7 % (ref 39.0–52.0)
Hemoglobin: 13.9 g/dL (ref 13.0–17.0)
Immature Granulocytes: 0 %
Lymphocytes Relative: 6 %
Lymphs Abs: 0.6 K/uL — ABNORMAL LOW (ref 0.7–4.0)
MCH: 29.8 pg (ref 26.0–34.0)
MCHC: 35 g/dL (ref 30.0–36.0)
MCV: 85 fL (ref 80.0–100.0)
Monocytes Absolute: 0.7 K/uL (ref 0.1–1.0)
Monocytes Relative: 8 %
Neutro Abs: 7.4 K/uL (ref 1.7–7.7)
Neutrophils Relative %: 79 %
Platelet Count: 142 K/uL — ABNORMAL LOW (ref 150–400)
RBC: 4.67 MIL/uL (ref 4.22–5.81)
RDW: 12.3 % (ref 11.5–15.5)
WBC Count: 9.4 K/uL (ref 4.0–10.5)
nRBC: 0 % (ref 0.0–0.2)

## 2024-02-28 LAB — CMP (CANCER CENTER ONLY)
ALT: 14 U/L (ref 0–44)
AST: 16 U/L (ref 15–41)
Albumin: 4.1 g/dL (ref 3.5–5.0)
Alkaline Phosphatase: 53 U/L (ref 38–126)
Anion gap: 4 — ABNORMAL LOW (ref 5–15)
BUN: 17 mg/dL (ref 8–23)
CO2: 24 mmol/L (ref 22–32)
Calcium: 8.8 mg/dL — ABNORMAL LOW (ref 8.9–10.3)
Chloride: 110 mmol/L (ref 98–111)
Creatinine: 1.34 mg/dL — ABNORMAL HIGH (ref 0.61–1.24)
GFR, Estimated: 56 mL/min — ABNORMAL LOW (ref >60)
Glucose, Bld: 160 mg/dL — ABNORMAL HIGH (ref 70–99)
Potassium: 4 mmol/L (ref 3.5–5.1)
Sodium: 138 mmol/L (ref 135–145)
Total Bilirubin: 0.6 mg/dL (ref 0.0–1.2)
Total Protein: 6.6 g/dL (ref 6.5–8.1)

## 2024-02-28 MED ORDER — LANREOTIDE ACETATE 120 MG/0.5ML ~~LOC~~ SOLN
120.0000 mg | Freq: Once | SUBCUTANEOUS | Status: AC
Start: 2024-02-28 — End: 2024-02-28
  Administered 2024-02-28: 120 mg via SUBCUTANEOUS
  Filled 2024-02-28: qty 120

## 2024-02-28 NOTE — Progress Notes (Signed)
 [facility] [phone]   Fax:([phone]  PROGRESS NOTE:  Patient Care Team: Stephanie Charlene CROME, MD as PCP - General (Family Medicine) Blanca Elsie RAMAN, MD as Attending Physician (Cardiology) Jason Charleston, MD (Inactive) as Attending Physician (Radiation Oncology) Alline Lenis, MD (Inactive) as Attending Physician (Urology)  CHIEF COMPLAINTS/PURPOSE OF CONSULTATION:  Right renal mass and peritoneal nodularity  ONCOLOGIC HISTORY: Presented to the [facility] ER on 12/11/2021 for painless hematuria and urinary urgency.  Work-up included urinalysis showed large leukocyte esterase, blood, protein, positive nitrates.  Urine culture grew E. coli.  Patient was treated with cefdinir.  He underwent CT imaging that showed a nonobstructing 6 mm stone in the interpolar left kidney, indeterminate 2.9 x 3.4 x 4.3 cm lesion in the upper pole right kidney, multiple peritoneal and omental nodules concerning for peritoneal carcinomatosis. 01/10/2022: CT chest: Incidental right lower lobe segmental and subsegmental pulmonary emboli.Minimal clot burden without right heart strain.No evidence of intrathoracic metastases.Small focus of ground-glass airspace disease within the right upper lobe likely inflammatory or infectious. Started on Eliquis  therapy 01/18/2022: CT guided biopsy of peritoneal mass: Well-differentiated low-grade neuroendocrine neoplasm with features  suspicious carcinoid tumor. Ki-67 shows very low proliferative index score.  01/30/2022: PET DOTATATE showed extensive tracer avid peritoneal metastasis and tracer avid liver metastasis to the posterior right hepatic lobe. 02/01/2022: Start lanreotide monthly injections 6.   01/26/2023: CT renal biopsy showed low grade papillary carcinoma of the kidney. Urology opted for surveillance rather than surgery.   INTERIM HISTORY:  Daniel Gibson returns today for a follow up visit for a low grade neuroendocrine  carcinoma.   On exam today Daniel Gibson reports he stayed [place] for the [place] holiday.  He reports overall he has been well but has been gaining weight.  He is up to 198 pounds, up 5 pounds from our last visit.  He reports his energy levels have been okay.  He notes he is doing a lot of yard work and is trying to kill the weeds.  He notes he is not currently having any pain anywhere.  His appetite remains strong and he is not having any nausea, vomiting, or diarrhea.  He notes that he does tend to stay cold.  He tolerates his Eliquis  therapy well with no major bleeding, bruising, or dark stools.  He is not having any major side effects as a result of his lanreotide therapy.  Overall he feels quite well and has no additional questions concerns or complaints.  He denies any fevers, chills, sweats, nausea, vomiting or diarrhea.  A full 10 point ROS is otherwise negative.  MEDICAL HISTORY:  Past Medical History:  Diagnosis Date   Abnormal cardiac CT angiography    Allergy to alpha-gal 12/27/2021   CAD (coronary artery disease), native coronary artery 04/08/2018   Cath 10/1991 in setting of inferior MI  [facility]      normal Left main, scattered irregularities LAD, 40% stenosis proximal Ramus, occluded OM 2, 60% stenosis proximal PDA, dominant CFX, small and nondominant RCA     Carotid artery disease (HCC) 05/08/2012   CEA on left 2013   Coronary artery disease    Diabetes mellitus type 2, noninsulin dependent (HCC) 04/08/2018   Diabetes mellitus without complication (HCC)    Food allergy    Alpha Gal allergy    GERD (gastroesophageal reflux disease)    Hyperlipidemia    Hypertensive heart disease without CHF 04/08/2018   Myocardial infarction [facility]) 1993  medically managed   Neuroendocrine carcinoma metastatic to liver [facility])    Neuroendocrine carcinoma metastatic to multiple sites (HCC) 01/26/2022   Obesity (BMI 30-39.9) 04/08/2018   Old myocardial infarct 04/08/2018   Peritoneal lesion  01/04/2022   Prostate cancer (HCC) 08/07/2016   Pulmonary embolism (HCC)    PVC's (premature ventricular contractions) 04/08/2018   Pyelonephritis 12/27/2021   Renal calculus, left 12/27/2021   S/P CABG x 3 10/18/2020   Thrombocytopenia (HCC)     SURGICAL HISTORY: Past Surgical History:  Procedure Laterality Date   APPENDECTOMY     CAROTID ENDARTERECTOMY Right 06-07-12   cea   CORONARY ARTERY BYPASS GRAFT N/A 10/18/2020   Procedure: CORONARY ARTERY BYPASS GRAFTING (CABG)x3. LEFT INTERNAL MAMMARY ARTERY. RIGHT ENDOSCOPIC SAPHENOUS VEIN HARVESTING.;  Surgeon: German Bartlett PEDLAR, MD;  Location: [facility] OR;  Service: Open Heart Surgery;  Laterality: N/A;   CYSTOSCOPY WITH RETROGRADE PYELOGRAM, URETEROSCOPY AND STENT PLACEMENT Right 01/10/2023   Procedure: CYSTOSCOPY WITH RETROGRADE PYELOGRAM, RIGHT DIAGNOSTIC URETEROSCOPY;  Surgeon: Alvaro Ricardo KATHEE Mickey., MD;  Location: [facility] ORS;  Service: Urology;  Laterality: Right;   ENDARTERECTOMY  06/07/2012   Procedure: ENDARTERECTOMY CAROTID;  Surgeon: Lonni GORMAN Blade, MD;  Location: [facility] OR;  Service: Vascular;  Laterality: Right;  Right Carotid Endarterectomy with Patch Angioplasty   LEFT HEART CATH AND CORONARY ANGIOGRAPHY N/A 10/07/2020   Procedure: LEFT HEART CATH AND CORONARY ANGIOGRAPHY;  Surgeon: Burnard Debby LABOR, MD;  Location: [facility] INVASIVE CV LAB;  Service: Cardiovascular;  Laterality: N/A;   TEE WITHOUT CARDIOVERSION N/A 10/18/2020   Procedure: TRANSESOPHAGEAL ECHOCARDIOGRAM (TEE);  Surgeon: German Bartlett PEDLAR, MD;  Location: [facility] OR;  Service: Open Heart Surgery;  Laterality: N/A;   TRANSPERINEAL IMPLANT OF RADIATION SEEDS W/ ULTRASOUND      SOCIAL HISTORY: Social History   Socioeconomic History   Marital status: Married    Spouse name: Not on file   Number of children: Not on file   Years of education: Not on file   Highest education level: Not on file  Occupational History   Not on file  Tobacco Use   Smoking status: Never   Smokeless  tobacco: Never  Vaping Use   Vaping status: Never Used  Substance and Sexual Activity   Alcohol use: No   Drug use: No   Sexual activity: Not Currently  Other Topics Concern   Not on file  Social History Narrative   Not on file   Social Drivers of Health   Financial Resource Strain: Not on file  Food Insecurity: Not on file  Transportation Needs: Not on file  Physical Activity: Not on file  Stress: Not on file  Social Connections: Not on file  Intimate Partner Violence: Not on file    FAMILY HISTORY: Family History  Problem Relation Age of Onset   Cancer Mother    Hyperlipidemia Mother    Hypertension Mother    Heart attack Father    Heart disease Father        Heart Disease before age 26   Hyperlipidemia Father    Hypertension Father    Diabetes Sister    Heart disease Sister        before age 5   Hypertension Sister    Hyperlipidemia Sister    Diabetes Brother    Heart disease Brother        before age 39   Hyperlipidemia Brother    Hypertension Brother     ALLERGIES:  is allergic to ace inhibitors, albumin (  human), beef-derived drug products, gelfoam-jmi sponge, heparin , pork-derived products, sheep-derived products, and tetanus toxoid-containing vaccines.  MEDICATIONS:  Current Outpatient Medications  Medication Sig Dispense Refill   amLODipine  (NORVASC ) 10 MG tablet Take 10 mg by mouth in the morning.     carvedilol  (COREG ) 25 MG tablet Take 12.5 mg by mouth 2 (two) times daily with a meal.     ELIQUIS  5 MG TABS tablet TAKE 1 TABLET BY MOUTH TWICE A DAY 60 tablet 5   EPINEPHrine  (EPIPEN  2-PAK) 0.3 mg/0.3 mL IJ SOAJ injection Use as directed for life-threatening allergic reaction. 2 each 3   ezetimibe  (ZETIA ) 10 MG tablet TAKE 1 TABLET BY MOUTH EVERY DAY 90 tablet 1   fluticasone (FLONASE) 50 MCG/ACT nasal spray Place 2 sprays into both nostrils daily.     glimepiride (AMARYL) 4 MG tablet Take 4 mg by mouth daily with breakfast.     metFORMIN   (GLUCOPHAGE ) 1000 MG tablet Take 1,000 mg by mouth 2 (two) times daily with a meal.     pantoprazole  (PROTONIX ) 40 MG tablet TAKE 1 TABLET BY MOUTH EVERY DAY 90 tablet 1   polyethylene glycol (MIRALAX / GLYCOLAX) 17 g packet Take 17 g by mouth daily as needed (constipation).     rosuvastatin  (CRESTOR ) 40 MG tablet TAKE 1 TABLET BY MOUTH EVERY DAY 90 tablet 1   senna-docusate (SENOKOT-S) 8.6-50 MG tablet Take 1 tablet by mouth 2 (two) times daily. While taking stong pain meds to prevent constipaiton 10 tablet 0   Tamsulosin  HCl (FLOMAX ) 0.4 MG CAPS Take 0.8 mg by mouth at bedtime.     traZODone  (DESYREL ) 50 MG tablet Take 50 mg by mouth at bedtime as needed for sleep.     Trospium  Chloride 60 MG CP24 Take 1 capsule by mouth daily.     valsartan  (DIOVAN ) 160 MG tablet Take 1 tablet (160 mg total) by mouth daily. 90 tablet 3   No current facility-administered medications for this visit.    REVIEW OF SYSTEMS:   Constitutional: ( - ) fevers, ( - )  chills , ( - ) night sweats Eyes: ( - ) blurriness of vision, ( - ) double vision, ( - ) watery eyes Ears, nose, mouth, throat, and face: ( - ) mucositis, ( - ) sore throat Respiratory: ( - ) cough, ( - ) dyspnea, ( - ) wheezes Cardiovascular: ( - ) palpitation, ( - ) chest discomfort, ( - ) lower extremity swelling Gastrointestinal:  ( - ) nausea, ( - ) heartburn, ( - ) change in bowel habits Skin: ( - ) abnormal skin rashes Lymphatics: ( - ) new lymphadenopathy, ( - ) easy bruising Neurological: ( - ) numbness, ( - ) tingling, ( - ) new weaknesses Behavioral/Psych: ( - ) mood change, ( - ) new changes  All other systems were reviewed with the patient and are negative.  PHYSICAL EXAMINATION: ECOG PERFORMANCE STATUS: 0 - Asymptomatic  Vitals:   02/28/24 1200  BP: (!) 151/70  Pulse: 62  Resp: 13  Temp: 97.8 F (36.6 C)  SpO2: 99%    Filed Weights   02/28/24 1200  Weight: 198 lb (89.8 kg)    GENERAL: well appearing Caucasian male in  NAD  SKIN: skin color, texture, turgor are normal, no rashes or significant lesions EYES: conjunctiva are pink and non-injected, sclera clear LUNGS: clear to auscultation and percussion with normal breathing effort HEART: regular rate & rhythm and no murmurs and no lower extremity edema Musculoskeletal:  no cyanosis of digits and no clubbing  PSYCH: alert & oriented x 3, fluent speech NEURO: no focal motor/sensory deficits  LABORATORY DATA:  I have reviewed the data as listed    Latest Ref Rng & Units 02/28/2024   11:22 AM 01/31/2024    2:53 PM 01/03/2024    2:30 PM  CBC  WBC 4.0 - 10.5 K/uL 9.4  9.3  9.9   Hemoglobin 13.0 - 17.0 g/dL 86.0  86.6  86.8   Hematocrit 39.0 - 52.0 % 39.7  38.2  37.7   Platelets 150 - 400 K/uL 142  145  148        Latest Ref Rng & Units 02/28/2024   11:22 AM 01/31/2024    2:53 PM 01/03/2024    2:30 PM  CMP  Glucose 70 - 99 mg/dL 839  890  773   BUN 8 - 23 mg/dL 17  14  16    Creatinine 0.61 - 1.24 mg/dL 8.65  8.76  8.79   Sodium 135 - 145 mmol/L 138  139  138   Potassium 3.5 - 5.1 mmol/L 4.0  4.1  4.0   Chloride 98 - 111 mmol/L 110  108  108   CO2 22 - 32 mmol/L 24  25  25    Calcium  8.9 - 10.3 mg/dL 8.8  9.1  9.0   Total Protein 6.5 - 8.1 g/dL 6.6  6.4  6.4   Total Bilirubin 0.0 - 1.2 mg/dL 0.6  0.6  0.6   Alkaline Phos 38 - 126 U/L 53  55  55   AST 15 - 41 U/L 16  15  13    ALT 0 - 44 U/L 14  12  11       RADIOGRAPHIC STUDIES: No results found.  ASSESSMENT & PLAN Daniel Gibson is a 73 y.o. male who presents for a follow up for newly diagnosed well differentiated neuroendocrine carcinoma, low grade.   #Well differentiated neuroendocrine carcinoma, low grade: --CT guided peritoneal mass biopsy on 01/18/2022 confirmed above diagnosis. --PET DOTATATE was completed on 01/30/2022, report pending --Recommend monthly lanreotide injection q 28 days. Reviewed dose, frequency and common side effects including injection site reaction, cholelithiasis,  gallbladder sludge, hyperglycemia, hypoglycemia, thyroid dysfunction and bradycardia.  --Labs from today were reviewed and require no intervention. Baseline Chromogranin levels were 159.7 on 02/01/2022.  PLAN: --Continue with monthly lanreotide injection q 28 days. --CBC and CMP every month before treatment. Chromagranin levels every 3 months --labs today show white blood cell 9.4, Hgb 13.9, MCV 85, Plt 142. and AST and ALT were within normal limits.  Creatinine 1.34 --chromogranin A last 71.8 on 10/11/2023 --last CT scan on 12/10/2023 showed no overt evidence of progression, though with contrast timing in that scan it was not certain. . Noted to have evidence of treatment response in visceral organs . Renal mass stable.  MRI due next month. --Urology following for renal mass, details noted below.  --RTC in 3 months for a toxicity evaluation and continued follow up. Interval monthly lanreotide injections.   # Papillary Carcinoma of the Kidney  --biopsy of renal mass performed on 01/26/2023, showed papillary renal cell carcinoma, solid type 1, nuclear grade 2  --urology declined surgery at this time in favor of surveillance  --continue to monitor with serial CT scans.   Orders Placed This Encounter  Procedures   MR Abdomen W Wo Contrast    Standing Status:   Future    Expected Date:   03/29/2024  Expiration Date:   02/27/2025    If indicated for the ordered procedure, I authorize the administration of contrast media per Radiology protocol:   Yes    What is the patient's sedation requirement?:   No Sedation    Does the patient have a pacemaker or implanted devices?:   No    Preferred imaging location?:   [facility] (table limit - 500lbs)    All questions were answered. The patient knows to call the clinic with any problems, questions or concerns.  I have spent a total of 30 minutes minutes of face-to-face and non-face-to-face time, preparing to see the patient,  performing a medically  appropriate examination, counseling and educating the patient, ordering tests/procedures,documenting clinical information in the electronic health record, independently interpreting results and communicating results to the patient, and care coordination.  Norleen IVAR Kidney, MD Department of Hematology/Oncology [facility] Phone: [phone] Pager: [phone] Email: [email or web address]

## 2024-02-29 LAB — CHROMOGRANIN A: Chromogranin A (ng/mL): 83 ng/mL (ref 0.0–101.8)

## 2024-03-01 ENCOUNTER — Other Ambulatory Visit: Payer: Self-pay | Admitting: Hematology and Oncology

## 2024-03-02 ENCOUNTER — Encounter: Payer: Self-pay | Admitting: Hematology and Oncology

## 2024-03-06 DIAGNOSIS — N5201 Erectile dysfunction due to arterial insufficiency: Secondary | ICD-10-CM | POA: Diagnosis not present

## 2024-03-06 DIAGNOSIS — D49511 Neoplasm of unspecified behavior of right kidney: Secondary | ICD-10-CM | POA: Diagnosis not present

## 2024-03-06 DIAGNOSIS — C61 Malignant neoplasm of prostate: Secondary | ICD-10-CM | POA: Diagnosis not present

## 2024-03-06 DIAGNOSIS — R3915 Urgency of urination: Secondary | ICD-10-CM | POA: Diagnosis not present

## 2024-03-11 ENCOUNTER — Other Ambulatory Visit: Payer: Self-pay | Admitting: Urology

## 2024-03-11 NOTE — Progress Notes (Signed)
 COVID Vaccine received:  []  No [x]  Yes Date of any COVID positive Test in last 90 days: no PCP - Charlene Single MD Cardiologist - Dr. Arthurine in [facility]  Chest x-ray - Chest CT 12/10/23 Epic EKG -   Stress Test -  ECHO - 10/15/20 Epic Cardiac Cath -  10/07/20 Epic Bowel Prep - [x]  No  []   Yes ______  Pacemaker / ICD device [x]  No []  Yes   Spinal Cord Stimulator:[x]  No []  Yes       History of Sleep Apnea? [x]  No []  Yes   CPAP used?- [x]  No []  Yes    Does the patient monitor blood sugar?          []  No [x]  Yes  []  N/A  Patient has: []  NO Hx DM   []  Pre-DM                 []  DM1  [x]   DM2 Does patient have a [employer] or Dexacom? [x]  No []  Yes   Fasting Blood Sugar Ranges- ? Checks Blood Sugar __1___ times a month  GLP1 agonist / usual dose - no GLP1 instructions:  SGLT-2 inhibitors / usual dose - no SGLT-2 instructions:   Blood Thinner / Instructions:Eliquis   No instruction on when to stop. Pt. Will call his MD. Aspirin  Instructions:no  Comments:   Activity level: Patient is able  to climb a flight of stairs without difficulty; [x]  No CP  [x]  No SOB,    Patient can  perform ADLs without assistance.   Anesthesia review: DM,CAD,MI-CABG 2022, Hx PE, On Eliquis  No instructions from MD on when to stop. Pt. Will call his MD.  Patient denies shortness of breath, fever, cough and chest pain at PAT appointment.  Patient verbalized understanding and agreement to the Pre-Surgical Instructions that were given to them at this PAT appointment. Patient was also educated of the need to review these PAT instructions again prior to his/her surgery.I reviewed the appropriate phone numbers to call if they have any and questions or concerns.

## 2024-03-11 NOTE — Patient Instructions (Signed)
 SURGICAL WAITING ROOM VISITATION  Patients having surgery or a procedure may have no more than 2 support people in the waiting area - these visitors may rotate.    Children under the age of 3 must have an adult with them who is not the patient.  Visitors with respiratory illnesses are discouraged from visiting and should remain at home.  If the patient needs to stay at the hospital during part of their recovery, the visitor guidelines for inpatient rooms apply. Pre-op nurse will coordinate an appropriate time for 1 support person to accompany patient in pre-op.  This support person may not rotate.    Please refer to the [facility] website for the visitor guidelines for Inpatients (after your surgery is over and you are in a regular room).       Your procedure is scheduled on: 03/20/24   Report to [facility] Main Entrance    Report to admitting at 12:15 PM   Call this number if you have problems the morning of surgery [phone]   Do not eat food :After Midnight.   After Midnight you may have the following liquids until ______ AM/ PM DAY OF SURGERY  Water  Non-Citrus Juices (without pulp, NO RED-Apple, White grape, White cranberry) Black Coffee (NO MILK/CREAM OR CREAMERS, sugar ok)  Clear Tea (NO MILK/CREAM OR CREAMERS, sugar ok) regular and decaf                             Plain Jell-O (NO RED)                                           Fruit ices (not with fruit pulp, NO RED)                                     Popsicles (NO RED)                                                               Sports drinks like Gatorade (NO RED)                The day of surgery:  Drink ONE (1) Pre-Surgery G2 at AM the morning of surgery. Drink in one sitting. Do not sip.  This drink was given to you during your hospital  pre-op appointment visit. Nothing else to drink after completing the  Pre-Surgery G2.    Oral Hygiene is also important to reduce your risk of infection.                                     Remember - BRUSH YOUR TEETH THE MORNING OF SURGERY WITH YOUR REGULAR TOOTHPASTE  DENTURES WILL BE REMOVED PRIOR TO SURGERY PLEASE DO NOT APPLY Poly grip OR ADHESIVES!!!   Stop all vitamins and herbal supplements 7 days before surgery.   Take these medicines the morning of surgery with A SIP OF WATER : amlodipine , carvedilol , ezetimibe , nasal spray, pantoprazole , rosuvastatin , tamsulosin   Do not take Diovan (valsartan ) the morning of surgery.   DO NOT TAKE ANY ORAL DIABETIC MEDICATIONS DAY OF YOUR SURGERY Hold Metformin  and Glimepiride the morning of surgery.             You may not have any metal on your body including hair pins, jewelry, and body piercing             Do not wear make-up, lotions, powders, perfumes/cologne, or deodorant              Men may shave face and neck.   Do not bring valuables to the hospital. [facility] IS NOT             RESPONSIBLE   FOR VALUABLES.   Contacts, glasses, dentures or bridgework may not be worn into surgery.  DO NOT BRING YOUR HOME MEDICATIONS TO THE HOSPITAL. PHARMACY WILL DISPENSE MEDICATIONS LISTED ON YOUR MEDICATION LIST TO YOU DURING YOUR ADMISSION IN THE HOSPITAL!    Patients discharged on the day of surgery will not be allowed to drive home.  Someone NEEDS to stay with you for the first 24 hours after anesthesia.   Special Instructions: Bring a copy of your healthcare power of attorney and living will documents the day of surgery if you haven't scanned them before.              Please read over the following fact sheets you were given: IF YOU HAVE QUESTIONS ABOUT YOUR PRE-OP INSTRUCTIONS PLEASE CALL [phone] Daniel Gibson   If you received a COVID test during your pre-op visit  it is requested that you wear a mask when out in public, stay away from anyone that may not be feeling well and notify your surgeon if you develop symptoms. If you test positive for Covid or have been in contact with anyone  that has tested positive in the last 10 days please notify you surgeon.    [facility] - Preparing for Surgery Before surgery, you can play an important role.  Because skin is not sterile, your skin needs to be as free of germs as possible.  You can reduce the number of germs on your skin by washing with CHG (chlorahexidine gluconate) soap before surgery.  CHG is an antiseptic cleaner which kills germs and bonds with the skin to continue killing germs even after washing. Please DO NOT use if you have an allergy to CHG or antibacterial soaps.  If your skin becomes reddened/irritated stop using the CHG and inform your nurse when you arrive at Short Stay. Do not shave (including legs and underarms) for at least 48 hours prior to the first CHG shower.  You may shave your face/neck.  Please follow these instructions carefully:  1.  Shower with CHG Soap the night before surgery and the  morning of surgery.  2.  If you choose to wash your hair, wash your hair first as usual with your normal  shampoo.  3.  After you shampoo, rinse your hair and body thoroughly to remove the shampoo.                             4.  Use CHG as you would any other liquid soap.  You can apply chg directly to the skin and wash.  Gently with a scrungie or clean washcloth.  5.  Apply the CHG Soap to your body ONLY FROM THE NECK DOWN.   Do  not use on face/ open                           Wound or open sores. Avoid contact with eyes, ears mouth and   genitals (private parts).                       Wash face,  Genitals (private parts) with your normal soap.             6.  Wash thoroughly, paying special attention to the area where your    surgery  will be performed.  7.  Thoroughly rinse your body with warm water  from the neck down.  8.  DO NOT shower/wash with your normal soap after using and rinsing off the CHG Soap.                9.  Pat yourself dry with a clean towel.            10.  Wear clean pajamas.            11.   Place clean sheets on your bed the night of your first shower and do not  sleep with pets. Day of Surgery : Do not apply any lotions/deodorants the morning of surgery.  Please wear clean clothes to the hospital/surgery center.  FAILURE TO FOLLOW THESE INSTRUCTIONS MAY RESULT IN THE CANCELLATION OF YOUR SURGERY  PATIENT SIGNATURE_________________________________  NURSE SIGNATURE__________________________________  ________________________________________________________________________

## 2024-03-11 NOTE — Progress Notes (Signed)
 Request sent to Dr. IVAR Likens to send pre op orders for PST appointment 03/13/24.

## 2024-03-12 ENCOUNTER — Telehealth: Payer: Self-pay | Admitting: *Deleted

## 2024-03-12 NOTE — Telephone Encounter (Signed)
 Faxed surgical clearance signed by Dr. Federico to [facility] Fax confirmation received

## 2024-03-13 ENCOUNTER — Encounter (HOSPITAL_COMMUNITY)
Admission: RE | Admit: 2024-03-13 | Discharge: 2024-03-13 | Disposition: A | Discharge status: Home / Self Care | Source: Ambulatory Visit | Attending: Urology | Admitting: Urology

## 2024-03-13 ENCOUNTER — Encounter (HOSPITAL_COMMUNITY): Payer: Self-pay

## 2024-03-13 ENCOUNTER — Other Ambulatory Visit: Payer: Self-pay

## 2024-03-13 VITALS — BP 146/75 | HR 62 | Temp 98.2°F | Resp 16 | Ht 70.0 in | Wt 190.0 lb

## 2024-03-13 DIAGNOSIS — Z951 Presence of aortocoronary bypass graft: Secondary | ICD-10-CM | POA: Insufficient documentation

## 2024-03-13 DIAGNOSIS — I2510 Atherosclerotic heart disease of native coronary artery without angina pectoris: Secondary | ICD-10-CM | POA: Insufficient documentation

## 2024-03-13 DIAGNOSIS — I509 Heart failure, unspecified: Secondary | ICD-10-CM | POA: Diagnosis not present

## 2024-03-13 DIAGNOSIS — Z01818 Encounter for other preprocedural examination: Secondary | ICD-10-CM | POA: Diagnosis not present

## 2024-03-13 DIAGNOSIS — N471 Phimosis: Secondary | ICD-10-CM | POA: Insufficient documentation

## 2024-03-13 DIAGNOSIS — K219 Gastro-esophageal reflux disease without esophagitis: Secondary | ICD-10-CM | POA: Diagnosis not present

## 2024-03-13 DIAGNOSIS — I110 Hypertensive heart disease with heart failure: Secondary | ICD-10-CM | POA: Diagnosis not present

## 2024-03-13 DIAGNOSIS — Z7984 Long term (current) use of oral hypoglycemic drugs: Secondary | ICD-10-CM | POA: Diagnosis not present

## 2024-03-13 DIAGNOSIS — Z86711 Personal history of pulmonary embolism: Secondary | ICD-10-CM | POA: Insufficient documentation

## 2024-03-13 DIAGNOSIS — E119 Type 2 diabetes mellitus without complications: Secondary | ICD-10-CM | POA: Diagnosis not present

## 2024-03-13 DIAGNOSIS — I119 Hypertensive heart disease without heart failure: Secondary | ICD-10-CM

## 2024-03-13 LAB — HEMOGLOBIN A1C
Hgb A1c MFr Bld: 8.5 % — ABNORMAL HIGH (ref 4.8–5.6)
Mean Plasma Glucose: 197.25 mg/dL

## 2024-03-13 LAB — GLUCOSE, CAPILLARY: Glucose-Capillary: 151 mg/dL — ABNORMAL HIGH (ref 70–99)

## 2024-03-14 NOTE — Progress Notes (Signed)
 Anesthesia Chart Review   Case: [id number] Date/Time: 03/20/24 1415   Procedure: CIRCUMCISION, ADULT   Anesthesia type: General   Diagnosis: Phimosis [N47.1]   Pre-op diagnosis: PHIMOSIS   Location: [facility]   Surgeons: Alvaro Ricardo KATHEE Mickey., MD       DISCUSSION:73 y.o. never smoker with h/o GERD, PE, CAD s/p CABG, CHF, DM II, neuroendocrine carcinoma and papillary carcinoma of the kidney, prostate cancer, phimosis scheduled for above procedure 03/20/2024 with Dr. Ricardo Alvaro.   Alpha-gal allergy.   Pt last seen by cardiology 07/17/23. Stable at this visit with 1 year follow up recommended. Pt reports he can climb a flight of stairs without chest pain or shortness of breath.   VS: BP (!) 146/75   Pulse 62   Temp 36.8 C (Oral)   Resp 16   Ht 5' 10 (1.778 m)   Wt 86.2 kg   SpO2 99%   BMI 27.26 kg/m   PROVIDERS: Hamrick, Charlene CROME, MD is PCP   Monetta Rogue, MD is Cardiologist  LABS: Labs reviewed: Acceptable for surgery. (all labs ordered are listed, but only abnormal results are displayed)  Labs Reviewed  HEMOGLOBIN A1C - Abnormal; Notable for the following components:      Result Value   Hgb A1c MFr Bld 8.5 (*)    All other components within normal limits  GLUCOSE, CAPILLARY - Abnormal; Notable for the following components:   Glucose-Capillary 151 (*)    All other components within normal limits     IMAGES:   EKG:   CV: Echo 10/15/20  1. Left ventricular ejection fraction, by estimation, is 55 to 60%. The  left ventricle has normal function. The left ventricle has no regional  wall motion abnormalities. Left ventricular diastolic parameters were  normal.   2. Right ventricular systolic function is normal. The right ventricular  size is normal. There is normal pulmonary artery systolic pressure.   3. The mitral valve is normal in structure. Trivial mitral valve  regurgitation.   4. The aortic valve is normal in structure. Aortic valve  regurgitation is  not visualized.  Past Medical History:  Diagnosis Date   Abnormal cardiac CT angiography    Allergy to alpha-gal 12/27/2021   CAD (coronary artery disease), native coronary artery 04/08/2018   Cath 10/1991 in setting of inferior MI  [facility]      normal Left main, scattered irregularities LAD, 40% stenosis proximal Ramus, occluded OM 2, 60% stenosis proximal PDA, dominant CFX, small and nondominant RCA     Carotid artery disease (HCC) 05/08/2012   CEA on left 2013   Coronary artery disease    Diabetes mellitus type 2, noninsulin dependent (HCC) 04/08/2018   Diabetes mellitus without complication (HCC)    Food allergy    Alpha Gal allergy    GERD (gastroesophageal reflux disease)    Hyperlipidemia    Hypertensive heart disease without CHF 04/08/2018   Myocardial infarction [facility]) 1993   medically managed   Neuroendocrine carcinoma metastatic to liver [facility])    Neuroendocrine carcinoma metastatic to multiple sites (HCC) 01/26/2022   Obesity (BMI 30-39.9) 04/08/2018   Old myocardial infarct 04/08/2018   Peritoneal lesion 01/04/2022   Prostate cancer (HCC) 08/07/2016   Pulmonary embolism (HCC)    PVC's (premature ventricular contractions) 04/08/2018   Pyelonephritis 12/27/2021   Renal calculus, left 12/27/2021   S/P CABG x 3 10/18/2020   Thrombocytopenia (HCC)     Past Surgical History:  Procedure  Laterality Date   APPENDECTOMY     CAROTID ENDARTERECTOMY Right 06-07-12   cea   CORONARY ARTERY BYPASS GRAFT N/A 10/18/2020   Procedure: CORONARY ARTERY BYPASS GRAFTING (CABG)x3. LEFT INTERNAL MAMMARY ARTERY. RIGHT ENDOSCOPIC SAPHENOUS VEIN HARVESTING.;  Surgeon: German Bartlett PEDLAR, MD;  Location: [facility];  Service: Open Heart Surgery;  Laterality: N/A;   CYSTOSCOPY WITH RETROGRADE PYELOGRAM, URETEROSCOPY AND STENT PLACEMENT Right 01/10/2023   Procedure: CYSTOSCOPY WITH RETROGRADE PYELOGRAM, RIGHT DIAGNOSTIC URETEROSCOPY;  Surgeon: Alvaro Ricardo KATHEE Mickey., MD;  Location: [facility];  Service: Urology;  Laterality: Right;   ENDARTERECTOMY  06/07/2012   Procedure: ENDARTERECTOMY CAROTID;  Surgeon: Lonni GORMAN Blade, MD;  Location: [facility] OR;  Service: Vascular;  Laterality: Right;  Right Carotid Endarterectomy with Patch Angioplasty   LEFT HEART CATH AND CORONARY ANGIOGRAPHY N/A 10/07/2020   Procedure: LEFT HEART CATH AND CORONARY ANGIOGRAPHY;  Surgeon: Burnard Debby LABOR, MD;  [facility] CV LAB;  Service: Cardiovascular;  Laterality: N/A;   TEE WITHOUT CARDIOVERSION N/A 10/18/2020   Procedure: TRANSESOPHAGEAL ECHOCARDIOGRAM (TEE);  Surgeon: German Bartlett PEDLAR, MD;  Location: [facility] OR;  Service: Open Heart Surgery;  Laterality: N/A;   TRANSPERINEAL IMPLANT OF RADIATION SEEDS W/ ULTRASOUND      MEDICATIONS:  amLODipine  (NORVASC ) 10 MG tablet   carvedilol  (COREG ) 25 MG tablet   ELIQUIS  5 MG TABS tablet   EPINEPHrine  (EPIPEN  2-PAK) 0.3 mg/0.3 mL IJ SOAJ injection   ezetimibe  (ZETIA ) 10 MG tablet   fluticasone (FLONASE) 50 MCG/ACT nasal spray   glimepiride (AMARYL) 4 MG tablet   JARDIANCE 10 MG TABS tablet   metFORMIN  (GLUCOPHAGE ) 1000 MG tablet   pantoprazole  (PROTONIX ) 40 MG tablet   polyethylene glycol (MIRALAX / GLYCOLAX) 17 g packet   rosuvastatin  (CRESTOR ) 40 MG tablet   senna-docusate (SENOKOT-S) 8.6-50 MG tablet   Tamsulosin  HCl (FLOMAX ) 0.4 MG CAPS   traZODone  (DESYREL ) 50 MG tablet   Trospium  Chloride 60 MG CP24   valsartan  (DIOVAN ) 160 MG tablet   No current facility-administered medications for this encounter.    Harlene Hoots Ward, PA-C [facility] [phone]

## 2024-03-20 ENCOUNTER — Encounter (HOSPITAL_COMMUNITY)
Admission: RE | Disposition: A | Discharge status: Home / Self Care | Payer: Self-pay | Source: Home / Self Care | Attending: Urology

## 2024-03-20 ENCOUNTER — Ambulatory Visit (HOSPITAL_COMMUNITY)
Admission: RE | Admit: 2024-03-20 | Discharge: 2024-03-20 | Disposition: A | Discharge status: Home / Self Care | Attending: Urology | Admitting: Urology

## 2024-03-20 ENCOUNTER — Ambulatory Visit (HOSPITAL_COMMUNITY): Payer: Self-pay | Admitting: Physician Assistant

## 2024-03-20 ENCOUNTER — Encounter (HOSPITAL_COMMUNITY): Payer: Self-pay | Admitting: Urology

## 2024-03-20 ENCOUNTER — Ambulatory Visit (HOSPITAL_BASED_OUTPATIENT_CLINIC_OR_DEPARTMENT_OTHER)

## 2024-03-20 DIAGNOSIS — I119 Hypertensive heart disease without heart failure: Secondary | ICD-10-CM

## 2024-03-20 DIAGNOSIS — E119 Type 2 diabetes mellitus without complications: Secondary | ICD-10-CM

## 2024-03-20 DIAGNOSIS — I2510 Atherosclerotic heart disease of native coronary artery without angina pectoris: Secondary | ICD-10-CM | POA: Diagnosis not present

## 2024-03-20 DIAGNOSIS — I252 Old myocardial infarction: Secondary | ICD-10-CM | POA: Insufficient documentation

## 2024-03-20 DIAGNOSIS — I10 Essential (primary) hypertension: Secondary | ICD-10-CM

## 2024-03-20 DIAGNOSIS — N471 Phimosis: Secondary | ICD-10-CM | POA: Diagnosis not present

## 2024-03-20 DIAGNOSIS — E669 Obesity, unspecified: Secondary | ICD-10-CM | POA: Insufficient documentation

## 2024-03-20 DIAGNOSIS — Z01818 Encounter for other preprocedural examination: Secondary | ICD-10-CM

## 2024-03-20 DIAGNOSIS — Z6827 Body mass index (BMI) 27.0-27.9, adult: Secondary | ICD-10-CM | POA: Diagnosis not present

## 2024-03-20 DIAGNOSIS — C7B8 Other secondary neuroendocrine tumors: Secondary | ICD-10-CM | POA: Insufficient documentation

## 2024-03-20 DIAGNOSIS — C61 Malignant neoplasm of prostate: Secondary | ICD-10-CM | POA: Diagnosis not present

## 2024-03-20 LAB — CBC
HCT: 42.7 % (ref 39.0–52.0)
Hemoglobin: 13.7 g/dL (ref 13.0–17.0)
MCH: 29.3 pg (ref 26.0–34.0)
MCHC: 32.1 g/dL (ref 30.0–36.0)
MCV: 91.2 fL (ref 80.0–100.0)
Platelets: 143 K/uL — ABNORMAL LOW (ref 150–400)
RBC: 4.68 MIL/uL (ref 4.22–5.81)
RDW: 12.9 % (ref 11.5–15.5)
WBC: 8.7 K/uL (ref 4.0–10.5)
nRBC: 0 % (ref 0.0–0.2)

## 2024-03-20 LAB — BASIC METABOLIC PANEL WITH GFR
Anion gap: 11 (ref 5–15)
BUN: 12 mg/dL (ref 8–23)
CO2: 23 mmol/L (ref 22–32)
Calcium: 9.2 mg/dL (ref 8.9–10.3)
Chloride: 110 mmol/L (ref 98–111)
Creatinine, Ser: 1.18 mg/dL (ref 0.61–1.24)
GFR, Estimated: >60 mL/min (ref >60)
Glucose, Bld: 142 mg/dL — ABNORMAL HIGH (ref 70–99)
Potassium: 3.6 mmol/L (ref 3.5–5.1)
Sodium: 143 mmol/L (ref 135–145)

## 2024-03-20 LAB — GLUCOSE, CAPILLARY
Glucose-Capillary: 150 mg/dL — ABNORMAL HIGH (ref 70–99)
Glucose-Capillary: 112 mg/dL — ABNORMAL HIGH (ref 70–99)

## 2024-03-20 SURGERY — CIRCUMCISION, ADULT
Anesthesia: General | Site: Penis

## 2024-03-20 MED ORDER — PROPOFOL 10 MG/ML IV BOLUS
INTRAVENOUS | Status: AC
Start: 2024-03-20 — End: 2024-03-20
  Filled 2024-03-20: qty 20

## 2024-03-20 MED ORDER — OXYCODONE HCL 5 MG PO TABS: 5.0000 mg | ORAL_TABLET | Freq: Once | ORAL | Status: DC | PRN

## 2024-03-20 MED ORDER — SENNOSIDES-DOCUSATE SODIUM 8.6-50 MG PO TABS: 1.0000 | ORAL_TABLET | Freq: Two times a day (BID) | ORAL | 0 refills | Status: DC

## 2024-03-20 MED ORDER — BUPIVACAINE HCL (PF) 0.25 % IJ SOLN
INTRAMUSCULAR | Status: AC
  Filled 2024-03-20: qty 30

## 2024-03-20 MED ORDER — ACETAMINOPHEN 10 MG/ML IV SOLN: 1000.0000 mg | Freq: Once | INTRAVENOUS | Status: DC | PRN

## 2024-03-20 MED ORDER — LIDOCAINE HCL (PF) 2 % IJ SOLN
INTRAMUSCULAR | Status: AC
  Filled 2024-03-20: qty 5

## 2024-03-20 MED ORDER — FENTANYL CITRATE PF 50 MCG/ML IJ SOSY: 25.0000 ug | PREFILLED_SYRINGE | INTRAMUSCULAR | Status: DC | PRN

## 2024-03-20 MED ORDER — INSULIN ASPART 100 UNIT/ML IJ SOLN: 0.0000 [IU] | INTRAMUSCULAR | Status: DC | PRN

## 2024-03-20 MED ORDER — EPHEDRINE 5 MG/ML INJ
INTRAVENOUS | Status: AC
  Filled 2024-03-20: qty 5

## 2024-03-20 MED ORDER — FENTANYL CITRATE (PF) 100 MCG/2ML IJ SOLN
INTRAMUSCULAR | Status: AC
  Filled 2024-03-20: qty 2

## 2024-03-20 MED ORDER — OXYCODONE HCL 5 MG/5ML PO SOLN: 5.0000 mg | Freq: Once | ORAL | Status: DC | PRN

## 2024-03-20 MED ORDER — PROPOFOL 10 MG/ML IV BOLUS
INTRAVENOUS | Status: DC | PRN
  Administered 2024-03-20: 150 mg via INTRAVENOUS
  Administered 2024-03-20: 30 mg via INTRAVENOUS

## 2024-03-20 MED ORDER — LIDOCAINE HCL (PF) 2 % IJ SOLN
INTRAMUSCULAR | Status: DC | PRN
  Administered 2024-03-20: 100 mg via INTRADERMAL

## 2024-03-20 MED ORDER — ONDANSETRON HCL 4 MG/2ML IJ SOLN
INTRAMUSCULAR | Status: AC
  Filled 2024-03-20: qty 2

## 2024-03-20 MED ORDER — 0.9 % SODIUM CHLORIDE (POUR BTL) OPTIME
TOPICAL | Status: DC | PRN
  Administered 2024-03-20: 1000 mL

## 2024-03-20 MED ORDER — LACTATED RINGERS IV SOLN: INTRAVENOUS | Status: DC

## 2024-03-20 MED ORDER — DEXAMETHASONE SODIUM PHOSPHATE 10 MG/ML IJ SOLN
INTRAMUSCULAR | Status: DC | PRN
  Administered 2024-03-20: 5 mg via INTRAVENOUS

## 2024-03-20 MED ORDER — CLINDAMYCIN PHOSPHATE 900 MG/50ML IV SOLN
900.0000 mg | INTRAVENOUS | Status: AC
  Administered 2024-03-20: 900 mg via INTRAVENOUS
  Filled 2024-03-20: qty 50

## 2024-03-20 MED ORDER — ORAL CARE MOUTH RINSE: 15.0000 mL | Freq: Once | OROMUCOSAL | Status: AC

## 2024-03-20 MED ORDER — BUPIVACAINE HCL 0.25 % IJ SOLN
INTRAMUSCULAR | Status: DC | PRN
  Administered 2024-03-20: 20 mL

## 2024-03-20 MED ORDER — ONDANSETRON HCL 4 MG/2ML IJ SOLN: 4.0000 mg | Freq: Once | INTRAMUSCULAR | Status: DC | PRN

## 2024-03-20 MED ORDER — ONDANSETRON HCL 4 MG/2ML IJ SOLN
INTRAMUSCULAR | Status: DC | PRN
  Administered 2024-03-20: 4 mg via INTRAVENOUS

## 2024-03-20 MED ORDER — CHLORHEXIDINE GLUCONATE 0.12 % MT SOLN
15.0000 mL | Freq: Once | OROMUCOSAL | Status: AC
  Administered 2024-03-20: 15 mL via OROMUCOSAL

## 2024-03-20 MED ORDER — DROPERIDOL 2.5 MG/ML IJ SOLN: 0.6250 mg | Freq: Once | INTRAMUSCULAR | Status: DC | PRN

## 2024-03-20 MED ORDER — FENTANYL CITRATE (PF) 100 MCG/2ML IJ SOLN
INTRAMUSCULAR | Status: DC | PRN
  Administered 2024-03-20: 25 ug via INTRAVENOUS

## 2024-03-20 MED ORDER — EPHEDRINE SULFATE-NACL 50-0.9 MG/10ML-% IV SOSY
PREFILLED_SYRINGE | INTRAVENOUS | Status: DC | PRN
Start: 2024-03-20 — End: 2024-03-20
  Administered 2024-03-20: 10 mg via INTRAVENOUS

## 2024-03-20 MED ORDER — OXYCODONE-ACETAMINOPHEN 5-325 MG PO TABS: 1.0000 | ORAL_TABLET | Freq: Four times a day (QID) | ORAL | 0 refills | Status: AC | PRN

## 2024-03-20 MED ORDER — PHENYLEPHRINE 80 MCG/ML (10ML) SYRINGE FOR IV PUSH (FOR BLOOD PRESSURE SUPPORT)
PREFILLED_SYRINGE | INTRAVENOUS | Status: DC | PRN
  Administered 2024-03-20: 160 ug via INTRAVENOUS

## 2024-03-20 SURGICAL SUPPLY — 27 items
BAG COUNTER SPONGE SURGICOUNT (BAG) IMPLANT
BLADE SURG 15 STRL LF DISP TIS (BLADE) ×1 IMPLANT
BNDG COHESIVE 2X5 TAN ST LF (GAUZE/BANDAGES/DRESSINGS) ×1 IMPLANT
BNDG COHESIVE 4X5 TAN STRL LF (GAUZE/BANDAGES/DRESSINGS) IMPLANT
COVER MAYO STAND STRL (DRAPES) ×1 IMPLANT
DRAPE LAPAROTOMY T 102X78X121 (DRAPES) ×1 IMPLANT
ELECT NDL TIP 2.8 STRL (NEEDLE) ×1 IMPLANT
ELECT NEEDLE TIP 2.8 STRL (NEEDLE) IMPLANT
ELECT PENCIL ROCKER SW 15FT (MISCELLANEOUS) ×1 IMPLANT
ELECT REM PT RETURN 15FT ADLT (MISCELLANEOUS) ×1 IMPLANT
GAUZE 4X4 16PLY ~~LOC~~+RFID DBL (SPONGE) ×1 IMPLANT
GAUZE SPONGE 4X4 12PLY STRL (GAUZE/BANDAGES/DRESSINGS) ×1 IMPLANT
GAUZE STRETCH 2X75IN STRL (MISCELLANEOUS) ×1 IMPLANT
GAUZE XEROFORM 1X8 LF (GAUZE/BANDAGES/DRESSINGS) IMPLANT
GLOVE SURG LX STRL 7.5 STRW (GLOVE) ×1 IMPLANT
GOWN SRG XL LVL 4 BRTHBL STRL (GOWNS) ×1 IMPLANT
KIT BASIN OR (CUSTOM PROCEDURE TRAY) ×1 IMPLANT
KIT TURNOVER KIT A (KITS) ×1 IMPLANT
NDL HYPO 25X1 1.5 SAFETY (NEEDLE) ×1 IMPLANT
NEEDLE HYPO 25X1 1.5 SAFETY (NEEDLE) ×1 IMPLANT
NS IRRIG 1000ML POUR BTL (IV SOLUTION) IMPLANT
PACK BASIC VI WITH GOWN DISP (CUSTOM PROCEDURE TRAY) ×1 IMPLANT
SPIKE FLUID TRANSFER (MISCELLANEOUS) IMPLANT
SUT VIC AB 3-0 SH 27XBRD (SUTURE) IMPLANT
SYR CONTROL 10ML LL (SYRINGE) ×1 IMPLANT
TOWEL OR 17X26 10 PK STRL BLUE (TOWEL DISPOSABLE) ×1 IMPLANT
WATER STERILE IRR 1000ML POUR (IV SOLUTION) IMPLANT

## 2024-03-20 NOTE — Op Note (Signed)
 NAME: Daniel Gibson, Daniel Gibson MEDICAL RECORD NO: [id number] ACCOUNT NO: [id number] DATE OF BIRTH: 10-04-1950 FACILITY: [facility] LOCATION: [facility] PHYSICIAN: Ricardo Likens, MD  Operative Report   DATE OF PROCEDURE: 03/20/2024  SURGEON:  Ricardo Likens, MD  PREOPERATIVE DIAGNOSES: 1.  Severe phimosis. 2.  History of diabetes.  PROCEDURE PERFORMED:  Circumcision.  ESTIMATED BLOOD LOSS:  Nil.  COMPLICATIONS:  None.  SPECIMENS:  Phimotic foreskin for discard.  FINDINGS: 1.  Severe phimosis with severe glans to inner foreskin leaflet adhesions.  2.  Complete resolution of all phimotic foreskin following circumcision.  INDICATIONS:  The patient is a pleasant 73 year old man with a history of significant diabetes with hemoglobin A1c of approximately 8, low-grade prostate cancer on surveillance.  He has a history of remote circumcision; however, he still has significant  redundant prepuce and significant phimosis making hygiene essentially impossible.  His meatus is just barely visible and his foreskin is non-retractile. It is becoming increasingly bothersome to him in terms of pain and problems with hygiene.  Options  were discussed including observation versus topical therapy versus revision circumcision.  He wished to proceed with the latter.  He presents for this today. Informed consent was obtained and placed on the medical record.  DESCRIPTION OF PROCEDURE:  The patient being Daniel Gibson verified, and procedure being circumcision was confirmed. Procedure time-out was performed.  Intravenous antibiotics administered.  General LMA anesthesia induced.  The patient was placed in  the supine position.  A sterile field was carefully created.  Prepped and draped the penis and perineum and proximal thighs using iodine.  His inner and outer foreskin leaflet were prepped as much as possible with iodine.  Sterile drapes were laid out  around this.   Attention was directed at the injection of local  anesthetic.  10 mL of 0.25% Marcaine  were instilled along the presumed course of the dorsal penile nerve just below the pubic symphysis and an additional 10 mL in a ring block fashion to the base of the  penis.  With further inspection under anesthetic, it became immediately apparent that the patient had severe glans to inner foreskin leaflet adhesions that were quite dense.  The proximal collar was noted corresponding to the unstretched corona of the  glans.  A small counter incision was made at the 2 o'clock position to further deliver the glans penis.  Further iodine solution was applied and a very careful dissection was performed between the glans and inner foreskin leaflet planes sharply  dissecting these severe circumferential adhesions away to free the inner foreskin leaflet away from the glans.  Again, the scarring was severe. The proximal collar was developed circumferentially with a knife incision as well as distal collar and four  hemostats were placed at the 12 o'clock position.  The redundant preputial collar was released from the underlying tissue using cautery dissection.  The redundant preputial collar was then set aside for discard.  There were no obvious gross lesions  concerning for any sort of malignancy.  Skin was reapproximated with interrupted vicryl at 12 and 6 oclock respectively and then two separate suture lines of 3-0 Vicryl were placed from the 12 o'clock to the 6 o'clock position respectively on each side which resulted in excellent  reapproximation of the proximal collar to the skin just proximal to the corona of the glans circumferentially. I was happy with the functional outcome of this; however, cosmesis was frankly quite poor given his incredibly denuded glans from adhesions.   Dressing of Xeroform  followed by Shera and Coban was loosely applied such that the meatus was visible, allowing void, and the procedure was terminated. The patient tolerated the procedure well.  No  immediate periprocedural complications.  The patient  taken to postanesthesia care unit in stable condition.  Plan for discharge home.   MUK D: 03/20/2024 3:19:19 pm T: 03/20/2024 10:50:00 pm  JOB: [id number]/ [id number]

## 2024-03-20 NOTE — Discharge Instructions (Signed)
 1 - A stitches are dissolvable and will disappear over about 3 weeks.   2 - Remove dressing tomorrow morning. OK to apply neosporin or similar daily to irritated skin to promote healing.   3 - No active sexual stimulation or tub bathing x 2 weeks, otherwise no restrictions.   4 - Call MD or go to ER for fever >102, severe pain / nausea / vomiting not relieved by medications, or acute change in medical status

## 2024-03-20 NOTE — Transfer of Care (Signed)
 Immediate Anesthesia Transfer of Care Note  Patient: Daniel Gibson  Procedure(s) Performed: CIRCUMCISION, ADULT (Penis)  Patient Location: PACU  Anesthesia Type:General  Level of Consciousness: sedated  Airway & Oxygen Therapy: Patient Spontanous Breathing and Patient connected to face mask oxygen  Post-op Assessment: Report given to RN and Post -op Vital signs reviewed and stable  Post vital signs: Reviewed and stable  Last Vitals:  Vitals Value Taken Time  BP    Temp    Pulse 58 03/20/24 15:18  Resp 10 03/20/24 15:18  SpO2 99 % 03/20/24 15:18  Vitals shown include unfiled device data.  Last Pain:  Vitals:   03/20/24 1254  TempSrc:   PainSc: 0-No pain         Complications: No notable events documented.

## 2024-03-20 NOTE — Anesthesia Procedure Notes (Signed)
 Procedure Name: LMA Insertion Date/Time: 03/20/2024 2:28 PM  Performed by: Carleton Garnette SAUNDERS, CRNAPre-anesthesia Checklist: Patient identified, Emergency Drugs available, Suction available, Patient being monitored and Timeout performed Patient Re-evaluated:Patient Re-evaluated prior to induction Oxygen Delivery Method: Circle system utilized Preoxygenation: Pre-oxygenation with 100% oxygen Induction Type: IV induction LMA: LMA inserted LMA Size: 4.0 Tube type: Oral Number of attempts: 1 Placement Confirmation: ETT inserted through vocal cords under direct vision, positive ETCO2 and breath sounds checked- equal and bilateral Tube secured with: Tape Dental Injury: Teeth and Oropharynx as per pre-operative assessment

## 2024-03-20 NOTE — H&P (Signed)
 Daniel Gibson is an 73 y.o. male.    Chief Complaint: Pre-OP Revision Circumsion  HPI:   1 - Low Risk Prostate Cancer - s/p primary brachytherapy with short term androgen deprivation (downsizing) by Daniel Gibson for multifocal Grade 1 disease, Pre-treatment PSA 4.4, TRUS 55mL. PSA <.01 2022 (met all criteria for cure).    2 - Lower Urinary Tract Symptoms / Bulbar Stricture - on tamsulosin  + tropism ER BID x years after brachytherapy. He had urethroscopy 2015 by Daniel Gibson as part of hematuria eval that showed bulbar stricture that was not treated. Had 54F catheter placed by Daniel Gibson 2022 at time of CABG, did not require dilation.   3 -  Phimosis - s/p circ eyars ago but with residual foreskin that has some phimomsis / adhesiosn of incrasing bother. Meatus still visible, barely. He is diabetic.   PMH sig for metastatic carcinoid, CAD/CABG (follows cards Dr. Monetta cards in [place]), CEA (follows vas curgery), DM2 (A1c 6-7), HTN, PE/Eliquus (resolved by serial imaging). His PCP is Daniel Single MD.   Today  Daniel Gibson  is seen to proceed with revision circumsiion for problematic phimosis. NO interval fevers. A1c 8, Cr 1.3 most recently.   Past Medical History:  Diagnosis Date   Abnormal cardiac CT angiography    Allergy to alpha-gal 12/27/2021   CAD (coronary artery disease), native coronary artery 04/08/2018   Cath 10/1991 in setting of inferior MI  [facility]      normal Left main, scattered irregularities LAD, 40% stenosis proximal Ramus, occluded OM 2, 60% stenosis proximal PDA, dominant CFX, small and nondominant RCA     Carotid artery disease 05/08/2012   CEA on left 2013   Coronary artery disease    Diabetes mellitus type 2, noninsulin dependent (HCC) 04/08/2018   Diabetes mellitus without complication (HCC)    Food allergy    Alpha Gal allergy    GERD (gastroesophageal reflux disease)    Hyperlipidemia    Hypertensive heart disease without CHF 04/08/2018   Myocardial infarction [facility])  1993   medically managed   Neuroendocrine carcinoma metastatic to liver [facility])    Neuroendocrine carcinoma metastatic to multiple sites (HCC) 01/26/2022   Obesity (BMI 30-39.9) 04/08/2018   Old myocardial infarct 04/08/2018   Peritoneal lesion 01/04/2022   Prostate cancer (HCC) 08/07/2016   Pulmonary embolism (HCC)    PVC's (premature ventricular contractions) 04/08/2018   Pyelonephritis 12/27/2021   Renal calculus, left 12/27/2021   S/P CABG x 3 10/18/2020   Thrombocytopenia     Past Surgical History:  Procedure Laterality Date   APPENDECTOMY     CAROTID ENDARTERECTOMY Right 06-07-12   cea   CORONARY ARTERY BYPASS GRAFT N/A 10/18/2020   Procedure: CORONARY ARTERY BYPASS GRAFTING (CABG)x3. LEFT INTERNAL MAMMARY ARTERY. RIGHT ENDOSCOPIC SAPHENOUS VEIN HARVESTING.;  Surgeon: German Bartlett PEDLAR, MD;  Location: [facility] OR;  Service: Open Heart Surgery;  Laterality: N/A;   CYSTOSCOPY WITH RETROGRADE PYELOGRAM, URETEROSCOPY AND STENT PLACEMENT Right 01/10/2023   Procedure: CYSTOSCOPY WITH RETROGRADE PYELOGRAM, RIGHT DIAGNOSTIC URETEROSCOPY;  Surgeon: Alvaro Ricardo KATHEE Mickey., MD;  Location: [facility];  Service: Urology;  Laterality: Right;   ENDARTERECTOMY  06/07/2012   Procedure: ENDARTERECTOMY CAROTID;  Surgeon: Lonni GORMAN Blade, MD;  Location: [facility] OR;  Service: Vascular;  Laterality: Right;  Right Carotid Endarterectomy with Patch Angioplasty   LEFT HEART CATH AND CORONARY ANGIOGRAPHY N/A 10/07/2020   Procedure: LEFT HEART CATH AND CORONARY ANGIOGRAPHY;  Surgeon: Burnard Debby LABOR, MD;  Location[facility] INVASIVE CV LAB;  Service: Cardiovascular;  Laterality: N/A;   TEE WITHOUT CARDIOVERSION N/A 10/18/2020   Procedure: TRANSESOPHAGEAL ECHOCARDIOGRAM (TEE);  Surgeon: German Bartlett PEDLAR, MD;  Location: [facility] OR;  Service: Open Heart Surgery;  Laterality: N/A;   TRANSPERINEAL IMPLANT OF RADIATION SEEDS W/ ULTRASOUND      Family History  Problem Relation Age of Onset   Cancer Mother    Hyperlipidemia Mother     Hypertension Mother    Heart attack Father    Heart disease Father        Heart Disease before age 88   Hyperlipidemia Father    Hypertension Father    Diabetes Sister    Heart disease Sister        before age 59   Hypertension Sister    Hyperlipidemia Sister    Diabetes Brother    Heart disease Brother        before age 77   Hyperlipidemia Brother    Hypertension Brother    Social History:  reports that he has never smoked. He has never used smokeless tobacco. He reports that he does not drink alcohol and does not use drugs.  Allergies:  Allergies  Allergen Reactions   Ace Inhibitors Swelling   Albumin (Human)     Potential interaction with Alpha-gal allergy   Beef-Derived Drug Products Anaphylaxis    Alpha-Gal allergy   Gelfoam-Jmi Sponge     Alpha-gal allergy.   Heparin      Potential interaction with alpha-gal allergy   Pork-Derived Products Anaphylaxis    Alpha-Gal allergy   Sheep-Derived Products Anaphylaxis    Alpha-Gal allergy   Tetanus Toxoid-Containing Vaccines Swelling    No medications prior to admission.    No results found for this or any previous visit (from the past 48 hours). No results found.  Review of Systems  Constitutional:  Negative for appetite change and fever.  Genitourinary:  Positive for difficulty urinating, penile discharge and penile pain.  All other systems reviewed and are negative.   There were no vitals taken for this visit. Physical Exam Vitals reviewed.  HENT:     Head: Normocephalic.  Eyes:     Pupils: Pupils are equal, round, and reactive to light.  Pulmonary:     Effort: Pulmonary effort is normal.  Abdominal:     General: Abdomen is flat.     Comments: Stable truncal obesity.   Genitourinary:    Comments: Stable significant phimosis, meatus barely visible.  Musculoskeletal:        General: Normal range of motion.  Skin:    General: Skin is warm.  Neurological:     General: No focal deficit present.      Mental Status: He is alert.  Psychiatric:        Mood and Affect: Mood normal.      Assessment/Plan Proceed as planned with revision circumcision. Risks, benefits, alternatives, expected peri-op course discussed previously and reiterated today.   Ricardo KATHEE Alvaro Mickey., MD 03/20/2024, 9:41 AM

## 2024-03-20 NOTE — Anesthesia Preprocedure Evaluation (Signed)
 Anesthesia Evaluation  Patient identified by MRN, date of birth, ID band Patient awake    Reviewed: Allergy & Precautions, NPO status , Patient's Chart, lab work & pertinent test results  History of Anesthesia Complications Negative for: history of anesthetic complications  Airway Mallampati: I       Dental no notable dental hx. (+) Poor Dentition, Missing, Chipped, Dental Advisory Given   Pulmonary neg recent URI, PE   breath sounds clear to auscultation       Cardiovascular hypertension, (-) angina + CAD and + Past MI  (-) pacemaker Rhythm:Regular Rate:Normal     Neuro/Psych neg Seizures    GI/Hepatic ,GERD  ,,  Endo/Other  diabetes, Type 2  Neuroendocrine Carcinoma  Renal/GU      Musculoskeletal   Abdominal   Peds  Hematology Thrombocytopenia   Anesthesia Other Findings   Reproductive/Obstetrics                              Anesthesia Physical Anesthesia Plan  ASA: 3  Anesthesia Plan: General   Post-op Pain Management:    Induction: Intravenous  PONV Risk Score and Plan: 1  Airway Management Planned: LMA  Additional Equipment:   Intra-op Plan:   Post-operative Plan: Extubation in OR  Informed Consent:      Dental advisory given  Plan Discussed with:   Anesthesia Plan Comments:         Anesthesia Quick Evaluation

## 2024-03-20 NOTE — Brief Op Note (Signed)
 03/20/2024  3:13 PM  PATIENT:  Daniel Gibson  73 y.o. male  PRE-OPERATIVE DIAGNOSIS:  SEVERE PHIMOSIS  POST-OPERATIVE DIAGNOSIS:  SEVERE PHIMOSIS  PROCEDURE:  Procedure(s): CIRCUMCISION, ADULT (N/A)  SURGEON:  Surgeons and Role:    * Manny, Ricardo KATHEE Raddle., MD - Primary  PHYSICIAN ASSISTANT:   ASSISTANTS: none   ANESTHESIA:   local and general  EBL:  minimal   BLOOD ADMINISTERED:none  DRAINS: none   LOCAL MEDICATIONS USED:  MARCAINE      SPECIMEN:  Source of Specimen:  phimotic foreskin  DISPOSITION OF SPECIMEN:  discard  COUNTS:  YES  TOURNIQUET:  * No tourniquets in log *  DICTATION: .Other Dictation: Dictation Number [id number]  PLAN OF CARE: Discharge to home after PACU  PATIENT DISPOSITION:  PACU - hemodynamically stable.   Delay start of Pharmacological VTE agent (>24hrs) due to surgical blood loss or risk of bleeding: not applicable

## 2024-03-21 ENCOUNTER — Encounter (HOSPITAL_COMMUNITY): Payer: Self-pay | Admitting: Urology

## 2024-03-21 NOTE — Anesthesia Postprocedure Evaluation (Signed)
 Anesthesia Post Note  Patient: Daniel Gibson  Procedure(s) Performed: CIRCUMCISION, ADULT (Penis)     Patient location during evaluation: PACU Anesthesia Type: General Level of consciousness: awake Pain management: pain level controlled Vital Signs Assessment: post-procedure vital signs reviewed and stable Respiratory status: spontaneous breathing Cardiovascular status: blood pressure returned to baseline Postop Assessment: no apparent nausea or vomiting and adequate PO intake Anesthetic complications: no   No notable events documented.  Last Vitals:  Vitals:   03/20/24 1600 03/20/24 1606  BP: (!) 148/72 (!) 158/82  Pulse: (!) 58 66  Resp: 14 20  Temp:  36.5 C  SpO2: 94% 97%    Last Pain:  Vitals:   03/20/24 1606  TempSrc: Temporal  PainSc: 0-No pain                 Lauraine KATHEE [place]

## 2024-03-27 ENCOUNTER — Inpatient Hospital Stay

## 2024-03-27 ENCOUNTER — Inpatient Hospital Stay: Attending: Physician Assistant

## 2024-03-27 ENCOUNTER — Other Ambulatory Visit: Payer: Self-pay | Admitting: *Deleted

## 2024-03-27 VITALS — BP 133/67 | HR 61 | Temp 98.2°F | Resp 16

## 2024-03-27 DIAGNOSIS — C7B8 Other secondary neuroendocrine tumors: Secondary | ICD-10-CM

## 2024-03-27 DIAGNOSIS — C7A8 Other malignant neuroendocrine tumors: Secondary | ICD-10-CM | POA: Diagnosis present

## 2024-03-27 LAB — CBC WITH DIFFERENTIAL (CANCER CENTER ONLY)
Abs Immature Granulocytes: 0.02 K/uL (ref 0.00–0.07)
Basophils Absolute: 0.1 K/uL (ref 0.0–0.1)
Basophils Relative: 1 %
Eosinophils Absolute: 0.4 K/uL (ref 0.0–0.5)
Eosinophils Relative: 5 %
HCT: 39.2 % (ref 39.0–52.0)
Hemoglobin: 13.7 g/dL (ref 13.0–17.0)
Immature Granulocytes: 0 %
Lymphocytes Relative: 7 %
Lymphs Abs: 0.6 K/uL — ABNORMAL LOW (ref 0.7–4.0)
MCH: 29.9 pg (ref 26.0–34.0)
MCHC: 34.9 g/dL (ref 30.0–36.0)
MCV: 85.6 fL (ref 80.0–100.0)
Monocytes Absolute: 0.9 K/uL (ref 0.1–1.0)
Monocytes Relative: 11 %
Neutro Abs: 6.4 K/uL (ref 1.7–7.7)
Neutrophils Relative %: 76 %
Platelet Count: 158 K/uL (ref 150–400)
RBC: 4.58 MIL/uL (ref 4.22–5.81)
RDW: 13.1 % (ref 11.5–15.5)
WBC Count: 8.4 K/uL (ref 4.0–10.5)
nRBC: 0 % (ref 0.0–0.2)

## 2024-03-27 LAB — CMP (CANCER CENTER ONLY)
ALT: 13 U/L (ref 0–44)
AST: 15 U/L (ref 15–41)
Albumin: 4.1 g/dL (ref 3.5–5.0)
Alkaline Phosphatase: 50 U/L (ref 38–126)
Anion gap: 6 (ref 5–15)
BUN: 19 mg/dL (ref 8–23)
CO2: 27 mmol/L (ref 22–32)
Calcium: 9.4 mg/dL (ref 8.9–10.3)
Chloride: 106 mmol/L (ref 98–111)
Creatinine: 1.36 mg/dL — ABNORMAL HIGH (ref 0.61–1.24)
GFR, Estimated: 55 mL/min — ABNORMAL LOW (ref >60)
Glucose, Bld: 181 mg/dL — ABNORMAL HIGH (ref 70–99)
Potassium: 3.5 mmol/L (ref 3.5–5.1)
Sodium: 139 mmol/L (ref 135–145)
Total Bilirubin: 0.9 mg/dL (ref 0.0–1.2)
Total Protein: 6.6 g/dL (ref 6.5–8.1)

## 2024-03-27 MED ORDER — LANREOTIDE ACETATE 120 MG/0.5ML ~~LOC~~ SOLN
120.0000 mg | Freq: Once | SUBCUTANEOUS | Status: AC
  Administered 2024-03-27: 120 mg via SUBCUTANEOUS
  Filled 2024-03-27: qty 120

## 2024-03-29 LAB — CHROMOGRANIN A: Chromogranin A (ng/mL): 88.1 ng/mL (ref 0.0–101.8)

## 2024-03-30 ENCOUNTER — Encounter (HOSPITAL_COMMUNITY): Payer: Self-pay

## 2024-03-31 ENCOUNTER — Ambulatory Visit (HOSPITAL_COMMUNITY)
Admission: RE | Admit: 2024-03-31 | Discharge: 2024-03-31 | Disposition: A | Discharge status: Home / Self Care | Source: Ambulatory Visit | Attending: Hematology and Oncology | Admitting: Hematology and Oncology

## 2024-03-31 DIAGNOSIS — C7B8 Other secondary neuroendocrine tumors: Secondary | ICD-10-CM | POA: Insufficient documentation

## 2024-03-31 DIAGNOSIS — C787 Secondary malignant neoplasm of liver and intrahepatic bile duct: Secondary | ICD-10-CM | POA: Diagnosis not present

## 2024-03-31 DIAGNOSIS — C786 Secondary malignant neoplasm of retroperitoneum and peritoneum: Secondary | ICD-10-CM | POA: Diagnosis not present

## 2024-03-31 DIAGNOSIS — C7A8 Other malignant neuroendocrine tumors: Secondary | ICD-10-CM | POA: Diagnosis not present

## 2024-03-31 DIAGNOSIS — R1901 Right upper quadrant abdominal swelling, mass and lump: Secondary | ICD-10-CM | POA: Diagnosis not present

## 2024-03-31 MED ORDER — GADOBUTROL 1 MMOL/ML IV SOLN
8.0000 mL | Freq: Once | INTRAVENOUS | Status: AC | PRN
  Administered 2024-03-31: 8 mL via INTRAVENOUS

## 2024-04-08 DIAGNOSIS — C61 Malignant neoplasm of prostate: Secondary | ICD-10-CM | POA: Diagnosis not present

## 2024-04-08 DIAGNOSIS — R399 Unspecified symptoms and signs involving the genitourinary system: Secondary | ICD-10-CM | POA: Diagnosis not present

## 2024-04-08 DIAGNOSIS — N471 Phimosis: Secondary | ICD-10-CM | POA: Diagnosis not present

## 2024-04-08 DIAGNOSIS — D49511 Neoplasm of unspecified behavior of right kidney: Secondary | ICD-10-CM | POA: Diagnosis not present

## 2024-04-11 ENCOUNTER — Encounter (HOSPITAL_COMMUNITY): Payer: Self-pay | Admitting: Urology

## 2024-04-16 ENCOUNTER — Ambulatory Visit: Payer: Self-pay | Admitting: *Deleted

## 2024-04-16 DIAGNOSIS — L570 Actinic keratosis: Secondary | ICD-10-CM | POA: Diagnosis not present

## 2024-04-16 DIAGNOSIS — D225 Melanocytic nevi of trunk: Secondary | ICD-10-CM | POA: Diagnosis not present

## 2024-04-16 DIAGNOSIS — L821 Other seborrheic keratosis: Secondary | ICD-10-CM | POA: Diagnosis not present

## 2024-04-16 DIAGNOSIS — L814 Other melanin hyperpigmentation: Secondary | ICD-10-CM | POA: Diagnosis not present

## 2024-04-16 DIAGNOSIS — D2261 Melanocytic nevi of right upper limb, including shoulder: Secondary | ICD-10-CM | POA: Diagnosis not present

## 2024-04-16 DIAGNOSIS — D2371 Other benign neoplasm of skin of right lower limb, including hip: Secondary | ICD-10-CM | POA: Diagnosis not present

## 2024-04-16 NOTE — Telephone Encounter (Addendum)
 PC to patient to provide results in message below. Left general message on non-named VM: Dr. Lafonda office calling with scan results. Please contact office for further details.   ----- Message from Norleen ONEIDA Kidney IV sent at 04/15/2024  4:39 PM EDT ----- Please let Daniel Gibson know that his MRI shows that his liver lesions appear improved to stable.  Overall it appears his current treatment is working well.  We will continue on his current treatment  plan and see him back as scheduled.

## 2024-04-22 ENCOUNTER — Telehealth: Payer: Self-pay | Admitting: *Deleted

## 2024-04-22 NOTE — Telephone Encounter (Signed)
 Daniel Gibson called - said he'd missed the call last week about his results.  Provided information as given by Der. Dorsey on 10/21: Please let Daniel Gibson know that his MRI shows that his liver lesions appear improved to stable. Overall it appears his current treatment is working well. We will continue on his current treatment plan and see him back as scheduled. Confirmed his upcoming appts on 10/30. He verbalized understanding of all information.

## 2024-04-24 ENCOUNTER — Inpatient Hospital Stay

## 2024-04-24 ENCOUNTER — Other Ambulatory Visit: Payer: Self-pay | Admitting: Hematology and Oncology

## 2024-04-24 VITALS — BP 147/67 | HR 65 | Resp 17

## 2024-04-24 DIAGNOSIS — C7A8 Other malignant neuroendocrine tumors: Secondary | ICD-10-CM

## 2024-04-24 LAB — CMP (CANCER CENTER ONLY)
ALT: 10 U/L (ref 0–44)
AST: 14 U/L — ABNORMAL LOW (ref 15–41)
Albumin: 4.1 g/dL (ref 3.5–5.0)
Alkaline Phosphatase: 53 U/L (ref 38–126)
Anion gap: 6 (ref 5–15)
BUN: 13 mg/dL (ref 8–23)
CO2: 27 mmol/L (ref 22–32)
Calcium: 9.2 mg/dL (ref 8.9–10.3)
Chloride: 105 mmol/L (ref 98–111)
Creatinine: 1.36 mg/dL — ABNORMAL HIGH (ref 0.61–1.24)
GFR, Estimated: 55 mL/min — ABNORMAL LOW (ref >60)
Glucose, Bld: 209 mg/dL — ABNORMAL HIGH (ref 70–99)
Potassium: 3.9 mmol/L (ref 3.5–5.1)
Sodium: 138 mmol/L (ref 135–145)
Total Bilirubin: 0.7 mg/dL (ref 0.0–1.2)
Total Protein: 6.4 g/dL — ABNORMAL LOW (ref 6.5–8.1)

## 2024-04-24 LAB — CBC WITH DIFFERENTIAL (CANCER CENTER ONLY)
Abs Immature Granulocytes: 0.02 K/uL (ref 0.00–0.07)
Basophils Absolute: 0.1 K/uL (ref 0.0–0.1)
Basophils Relative: 1 %
Eosinophils Absolute: 0.5 K/uL (ref 0.0–0.5)
Eosinophils Relative: 5 %
HCT: 38.9 % — ABNORMAL LOW (ref 39.0–52.0)
Hemoglobin: 13.7 g/dL (ref 13.0–17.0)
Immature Granulocytes: 0 %
Lymphocytes Relative: 6 %
Lymphs Abs: 0.5 K/uL — ABNORMAL LOW (ref 0.7–4.0)
MCH: 29.9 pg (ref 26.0–34.0)
MCHC: 35.2 g/dL (ref 30.0–36.0)
MCV: 84.9 fL (ref 80.0–100.0)
Monocytes Absolute: 0.7 K/uL (ref 0.1–1.0)
Monocytes Relative: 8 %
Neutro Abs: 7.2 K/uL (ref 1.7–7.7)
Neutrophils Relative %: 80 %
Platelet Count: 144 K/uL — ABNORMAL LOW (ref 150–400)
RBC: 4.58 MIL/uL (ref 4.22–5.81)
RDW: 12.7 % (ref 11.5–15.5)
WBC Count: 9 K/uL (ref 4.0–10.5)
nRBC: 0 % (ref 0.0–0.2)

## 2024-04-24 MED ORDER — LANREOTIDE ACETATE 120 MG/0.5ML ~~LOC~~ SOLN
120.0000 mg | Freq: Once | SUBCUTANEOUS | Status: AC
  Administered 2024-04-24: 120 mg via SUBCUTANEOUS
  Filled 2024-04-24: qty 120

## 2024-04-24 NOTE — Progress Notes (Signed)
 Daniel Gibson                                          MRN: [id number]   04/24/2024   The [facility] Quality Team Specialist reviewed this patient medical record for the purposes of chart review for care gap closure. The following were reviewed: chart review for care gap closure-controlling blood pressure.    [facility] Quality Team

## 2024-04-26 LAB — CHROMOGRANIN A: Chromogranin A (ng/mL): 65.7 ng/mL (ref 0.0–101.8)

## 2024-05-05 DIAGNOSIS — Z79899 Other long term (current) drug therapy: Secondary | ICD-10-CM | POA: Diagnosis not present

## 2024-05-05 DIAGNOSIS — E782 Mixed hyperlipidemia: Secondary | ICD-10-CM | POA: Diagnosis not present

## 2024-05-08 DIAGNOSIS — I10 Essential (primary) hypertension: Secondary | ICD-10-CM | POA: Diagnosis not present

## 2024-05-08 DIAGNOSIS — Z139 Encounter for screening, unspecified: Secondary | ICD-10-CM | POA: Diagnosis not present

## 2024-05-08 DIAGNOSIS — C7A8 Other malignant neuroendocrine tumors: Secondary | ICD-10-CM | POA: Diagnosis not present

## 2024-05-08 DIAGNOSIS — E1129 Type 2 diabetes mellitus with other diabetic kidney complication: Secondary | ICD-10-CM | POA: Diagnosis not present

## 2024-05-08 DIAGNOSIS — J309 Allergic rhinitis, unspecified: Secondary | ICD-10-CM | POA: Diagnosis not present

## 2024-05-08 DIAGNOSIS — E782 Mixed hyperlipidemia: Secondary | ICD-10-CM | POA: Diagnosis not present

## 2024-05-08 DIAGNOSIS — C641 Malignant neoplasm of right kidney, except renal pelvis: Secondary | ICD-10-CM | POA: Diagnosis not present

## 2024-05-08 DIAGNOSIS — R809 Proteinuria, unspecified: Secondary | ICD-10-CM | POA: Diagnosis not present

## 2024-05-08 DIAGNOSIS — C7B8 Other secondary neuroendocrine tumors: Secondary | ICD-10-CM | POA: Diagnosis not present

## 2024-05-08 DIAGNOSIS — N1831 Chronic kidney disease, stage 3a: Secondary | ICD-10-CM | POA: Diagnosis not present

## 2024-05-08 DIAGNOSIS — G4700 Insomnia, unspecified: Secondary | ICD-10-CM | POA: Diagnosis not present

## 2024-05-08 DIAGNOSIS — Z1331 Encounter for screening for depression: Secondary | ICD-10-CM | POA: Diagnosis not present

## 2024-05-21 ENCOUNTER — Inpatient Hospital Stay

## 2024-05-21 ENCOUNTER — Other Ambulatory Visit: Payer: Self-pay | Admitting: Hematology and Oncology

## 2024-05-21 ENCOUNTER — Inpatient Hospital Stay: Attending: Physician Assistant | Admitting: Hematology and Oncology

## 2024-05-21 VITALS — BP 178/84 | HR 64 | Temp 97.2°F | Resp 13 | Wt 191.6 lb

## 2024-05-21 DIAGNOSIS — C7A8 Other malignant neuroendocrine tumors: Secondary | ICD-10-CM

## 2024-05-21 DIAGNOSIS — C7B8 Other secondary neuroendocrine tumors: Secondary | ICD-10-CM | POA: Insufficient documentation

## 2024-05-21 LAB — CBC WITH DIFFERENTIAL (CANCER CENTER ONLY)
Abs Immature Granulocytes: 0.02 K/uL (ref 0.00–0.07)
Basophils Absolute: 0.1 K/uL (ref 0.0–0.1)
Basophils Relative: 1 %
Eosinophils Absolute: 0.5 K/uL (ref 0.0–0.5)
Eosinophils Relative: 6 %
HCT: 39.2 % (ref 39.0–52.0)
Hemoglobin: 13.9 g/dL (ref 13.0–17.0)
Immature Granulocytes: 0 %
Lymphocytes Relative: 7 %
Lymphs Abs: 0.6 K/uL — ABNORMAL LOW (ref 0.7–4.0)
MCH: 30.2 pg (ref 26.0–34.0)
MCHC: 35.5 g/dL (ref 30.0–36.0)
MCV: 85 fL (ref 80.0–100.0)
Monocytes Absolute: 0.9 K/uL (ref 0.1–1.0)
Monocytes Relative: 10 %
Neutro Abs: 7.3 K/uL (ref 1.7–7.7)
Neutrophils Relative %: 76 %
Platelet Count: 150 K/uL (ref 150–400)
RBC: 4.61 MIL/uL (ref 4.22–5.81)
RDW: 13.1 % (ref 11.5–15.5)
WBC Count: 9.5 K/uL (ref 4.0–10.5)
nRBC: 0 % (ref 0.0–0.2)

## 2024-05-21 LAB — CMP (CANCER CENTER ONLY)
ALT: 18 U/L (ref 0–44)
AST: 19 U/L (ref 15–41)
Albumin: 4.3 g/dL (ref 3.5–5.0)
Alkaline Phosphatase: 63 U/L (ref 38–126)
Anion gap: 10 (ref 5–15)
BUN: 15 mg/dL (ref 8–23)
CO2: 23 mmol/L (ref 22–32)
Calcium: 9.4 mg/dL (ref 8.9–10.3)
Chloride: 106 mmol/L (ref 98–111)
Creatinine: 1.16 mg/dL (ref 0.61–1.24)
GFR, Estimated: >60 mL/min (ref >60)
Glucose, Bld: 141 mg/dL — ABNORMAL HIGH (ref 70–99)
Potassium: 4.2 mmol/L (ref 3.5–5.1)
Sodium: 139 mmol/L (ref 135–145)
Total Bilirubin: 0.5 mg/dL (ref 0.0–1.2)
Total Protein: 6.8 g/dL (ref 6.5–8.1)

## 2024-05-21 MED ORDER — LANREOTIDE ACETATE 120 MG/0.5ML ~~LOC~~ SOLN
120.0000 mg | Freq: Once | SUBCUTANEOUS | Status: AC
  Administered 2024-05-21: 120 mg via SUBCUTANEOUS
  Filled 2024-05-21: qty 0.5

## 2024-05-21 NOTE — Progress Notes (Signed)
 [facility] [phone]   Fax:([phone]  PROGRESS NOTE:  Patient Care Team: Stephanie Charlene CROME, MD as PCP - General (Family Medicine) Blanca Elsie RAMAN, MD as Attending Physician (Cardiology) Jason Charleston, MD (Inactive) as Attending Physician (Radiation Oncology) Alline Lenis, MD (Inactive) as Attending Physician (Urology)  CHIEF COMPLAINTS/PURPOSE OF CONSULTATION:  Right renal mass and peritoneal nodularity  ONCOLOGIC HISTORY: Presented to the [facility] ER on 12/11/2021 for painless hematuria and urinary urgency.  Work-up included urinalysis showed large leukocyte esterase, blood, protein, positive nitrates.  Urine culture grew E. coli.  Patient was treated with cefdinir.  He underwent CT imaging that showed a nonobstructing 6 mm stone in the interpolar left kidney, indeterminate 2.9 x 3.4 x 4.3 cm lesion in the upper pole right kidney, multiple peritoneal and omental nodules concerning for peritoneal carcinomatosis. 01/10/2022: CT chest: Incidental right lower lobe segmental and subsegmental pulmonary emboli.Minimal clot burden without right heart strain.No evidence of intrathoracic metastases.Small focus of ground-glass airspace disease within the right upper lobe likely inflammatory or infectious. Started on Eliquis  therapy 01/18/2022: CT guided biopsy of peritoneal mass: Well-differentiated low-grade neuroendocrine neoplasm with features  suspicious carcinoid tumor. Ki-67 shows very low proliferative index score.  01/30/2022: PET DOTATATE showed extensive tracer avid peritoneal metastasis and tracer avid liver metastasis to the posterior right hepatic lobe. 02/01/2022: Start lanreotide monthly injections 6.   01/26/2023: CT renal biopsy showed low grade papillary carcinoma of the kidney. Urology opted for surveillance rather than surgery.   INTERIM HISTORY:  Daniel Gibson returns today for a follow up visit for a low grade neuroendocrine  carcinoma.   On exam today Daniel Gibson reports he has been well overall in the interim since our last visit.  He reports he will be having his Thanksgiving dinner at [employer] in [place] with the rest of his family.  He reports his appetite remains good.  He is tolerating lanreotide well with no major side effects.  He reports that he is not having any abdominal pain and denies any nausea, vomiting, or diarrhea.  He is not having any fevers, chills, sweats.  He reports occasionally he does have some growling of his stomach.  He has not taken his blood pressure medications for the last few days which is why his blood pressure is elevated.  He denies any lightheadedness, dizziness, or shortness of breath and also denies any headache or vision changes.  Overall he is willing and able to proceed with lanreotide therapy at this time.  Full 10 point ROS is otherwise negative.  MEDICAL HISTORY:  Past Medical History:  Diagnosis Date   Abnormal cardiac CT angiography    Allergy to alpha-gal 12/27/2021   CAD (coronary artery disease), native coronary artery 04/08/2018   Cath 10/1991 in setting of inferior MI  [facility]      normal Left main, scattered irregularities LAD, 40% stenosis proximal Ramus, occluded OM 2, 60% stenosis proximal PDA, dominant CFX, small and nondominant RCA     Carotid artery disease 05/08/2012   CEA on left 2013   Coronary artery disease    Diabetes mellitus type 2, noninsulin dependent (HCC) 04/08/2018   Diabetes mellitus without complication (HCC)    Food allergy    Alpha Gal allergy    GERD (gastroesophageal reflux disease)    Hyperlipidemia    Hypertensive heart disease without CHF 04/08/2018   Myocardial infarction [facility]) 1993   medically managed   Neuroendocrine carcinoma metastatic to liver (HCC)  Neuroendocrine carcinoma metastatic to multiple sites (HCC) 01/26/2022   Obesity (BMI 30-39.9) 04/08/2018   Old myocardial infarct 04/08/2018   Peritoneal lesion  01/04/2022   Prostate cancer (HCC) 08/07/2016   Pulmonary embolism (HCC)    PVC's (premature ventricular contractions) 04/08/2018   Pyelonephritis 12/27/2021   Renal calculus, left 12/27/2021   S/P CABG x 3 10/18/2020   Thrombocytopenia     SURGICAL HISTORY: Past Surgical History:  Procedure Laterality Date   APPENDECTOMY     CAROTID ENDARTERECTOMY Right 06/07/2012   cea   CIRCUMCISION N/A 03/20/2024   Procedure: CIRCUMCISION, ADULT;  Surgeon: Alvaro Ricardo KATHEE Mickey., MD;  Location: [facility] ORS;  Service: Urology;  Laterality: N/A;   CORONARY ARTERY BYPASS GRAFT N/A 10/18/2020   Procedure: CORONARY ARTERY BYPASS GRAFTING (CABG)x3. LEFT INTERNAL MAMMARY ARTERY. RIGHT ENDOSCOPIC SAPHENOUS VEIN HARVESTING.;  Surgeon: German Bartlett PEDLAR, MD;  Location: [facility] OR;  Service: Open Heart Surgery;  Laterality: N/A;   CYSTOSCOPY WITH RETROGRADE PYELOGRAM, URETEROSCOPY AND STENT PLACEMENT Right 01/10/2023   Procedure: CYSTOSCOPY WITH RETROGRADE PYELOGRAM, RIGHT DIAGNOSTIC URETEROSCOPY;  Surgeon: Alvaro Ricardo KATHEE Mickey., MD;  Location: [facility];  Service: Urology;  Laterality: Right;   ENDARTERECTOMY  06/07/2012   Procedure: ENDARTERECTOMY CAROTID;  Surgeon: Lonni GORMAN Blade, MD;  Location[facility];  Service: Vascular;  Laterality: Right;  Right Carotid Endarterectomy with Patch Angioplasty   LEFT HEART CATH AND CORONARY ANGIOGRAPHY N/A 10/07/2020   Procedure: LEFT HEART CATH AND CORONARY ANGIOGRAPHY;  Surgeon: Burnard Debby LABOR, MD;  Location: [facility] INVASIVE CV LAB;  Service: Cardiovascular;  Laterality: N/A;   TEE WITHOUT CARDIOVERSION N/A 10/18/2020   Procedure: TRANSESOPHAGEAL ECHOCARDIOGRAM (TEE);  Surgeon: German Bartlett PEDLAR, MD;  Location: [facility] OR;  Service: Open Heart Surgery;  Laterality: N/A;   TRANSPERINEAL IMPLANT OF RADIATION SEEDS W/ ULTRASOUND      SOCIAL HISTORY: Social History   Socioeconomic History   Marital status: Married    Spouse name: Not on file   Number of children: Not on file   Years  of education: Not on file   Highest education level: Not on file  Occupational History   Not on file  Tobacco Use   Smoking status: Never   Smokeless tobacco: Never  Vaping Use   Vaping status: Never Used  Substance and Sexual Activity   Alcohol use: No   Drug use: No   Sexual activity: Not Currently  Other Topics Concern   Not on file  Social History Narrative   Not on file   Social Drivers of Health   Financial Resource Strain: Not on file  Food Insecurity: Not on file  Transportation Needs: Not on file  Physical Activity: Not on file  Stress: Not on file  Social Connections: Not on file  Intimate Partner Violence: Not on file    FAMILY HISTORY: Family History  Problem Relation Age of Onset   Cancer Mother    Hyperlipidemia Mother    Hypertension Mother    Heart attack Father    Heart disease Father        Heart Disease before age 60   Hyperlipidemia Father    Hypertension Father    Diabetes Sister    Heart disease Sister        before age 37   Hypertension Sister    Hyperlipidemia Sister    Diabetes Brother    Heart disease Brother        before age 19   Hyperlipidemia Brother    Hypertension  Brother     ALLERGIES:  is allergic to ace inhibitors, albumin (human), bovine (beef) protein-containing drug products, gelfoam-jmi sponge, heparin , porcine (pork) protein-containing drug products, sheep-derived products, and tetanus toxoid-containing vaccines.  MEDICATIONS:  Current Outpatient Medications  Medication Sig Dispense Refill   amLODipine  (NORVASC ) 10 MG tablet Take 10 mg by mouth in the morning.     carvedilol  (COREG ) 25 MG tablet Take 12.5 mg by mouth 2 (two) times daily with a meal.     ELIQUIS  5 MG TABS tablet TAKE 1 TABLET BY MOUTH TWICE A DAY 60 tablet 5   EPINEPHrine  (EPIPEN  2-PAK) 0.3 mg/0.3 mL IJ SOAJ injection Use as directed for life-threatening allergic reaction. 2 each 3   ezetimibe  (ZETIA ) 10 MG tablet TAKE 1 TABLET BY MOUTH EVERY DAY 90  tablet 1   fluticasone (FLONASE) 50 MCG/ACT nasal spray Place 2 sprays into both nostrils daily as needed for allergies.     glimepiride (AMARYL) 4 MG tablet Take 4 mg by mouth in the morning and at bedtime.     JARDIANCE 10 MG TABS tablet Take 10 mg by mouth daily.     metFORMIN  (GLUCOPHAGE ) 1000 MG tablet Take 1,000 mg by mouth 2 (two) times daily with a meal.     oxyCODONE -acetaminophen  (PERCOCET) 5-325 MG tablet Take 1 tablet by mouth every 6 (six) hours as needed for moderate pain (pain score 4-6) or severe pain (pain score 7-10) (post-operatively). 15 tablet 0   pantoprazole  (PROTONIX ) 40 MG tablet TAKE 1 TABLET BY MOUTH EVERY DAY 90 tablet 1   polyethylene glycol (MIRALAX / GLYCOLAX) 17 g packet Take 17 g by mouth daily as needed (constipation).     rosuvastatin  (CRESTOR ) 40 MG tablet TAKE 1 TABLET BY MOUTH EVERY DAY 90 tablet 1   senna-docusate (SENOKOT-S) 8.6-50 MG tablet Take 1 tablet by mouth 2 (two) times daily. While taking stong pain meds to prevent constipaiton (Patient taking differently: Take 1 tablet by mouth 2 (two) times daily as needed for moderate constipation.) 10 tablet 0   senna-docusate (SENOKOT-S) 8.6-50 MG tablet Take 1 tablet by mouth 2 (two) times daily. While taking strong pain meds to prevent constipation. 10 tablet 0   Tamsulosin  HCl (FLOMAX ) 0.4 MG CAPS Take 0.8 mg by mouth at bedtime.     traZODone  (DESYREL ) 50 MG tablet Take 50 mg by mouth at bedtime as needed for sleep.     Trospium  Chloride 60 MG CP24 Take 1 capsule by mouth daily.     valsartan  (DIOVAN ) 160 MG tablet Take 1 tablet (160 mg total) by mouth daily. 90 tablet 3   No current facility-administered medications for this visit.    REVIEW OF SYSTEMS:   Constitutional: ( - ) fevers, ( - )  chills , ( - ) night sweats Eyes: ( - ) blurriness of vision, ( - ) double vision, ( - ) watery eyes Ears, nose, mouth, throat, and face: ( - ) mucositis, ( - ) sore throat Respiratory: ( - ) cough, ( - ) dyspnea,  ( - ) wheezes Cardiovascular: ( - ) palpitation, ( - ) chest discomfort, ( - ) lower extremity swelling Gastrointestinal:  ( - ) nausea, ( - ) heartburn, ( - ) change in bowel habits Skin: ( - ) abnormal skin rashes Lymphatics: ( - ) new lymphadenopathy, ( - ) easy bruising Neurological: ( - ) numbness, ( - ) tingling, ( - ) new weaknesses Behavioral/Psych: ( - ) mood change, ( - )  new changes  All other systems were reviewed with the patient and are negative.  PHYSICAL EXAMINATION: ECOG PERFORMANCE STATUS: 0 - Asymptomatic  Vitals:   05/21/24 1454  BP: (!) 178/84  Pulse: 64  Resp: 13  Temp: (!) 97.2 F (36.2 C)  SpO2: 100%     Filed Weights   05/21/24 1454  Weight: 191 lb 9.6 oz (86.9 kg)     GENERAL: well appearing Caucasian male in NAD  SKIN: skin color, texture, turgor are normal, no rashes or significant lesions EYES: conjunctiva are pink and non-injected, sclera clear LUNGS: clear to auscultation and percussion with normal breathing effort HEART: regular rate & rhythm and no murmurs and no lower extremity edema Musculoskeletal: no cyanosis of digits and no clubbing  PSYCH: alert & oriented x 3, fluent speech NEURO: no focal motor/sensory deficits  LABORATORY DATA:  I have reviewed the data as listed    Latest Ref Rng & Units 05/21/2024    2:40 PM 04/24/2024    2:27 PM 03/27/2024    2:41 PM  CBC  WBC 4.0 - 10.5 K/uL 9.5  9.0  8.4   Hemoglobin 13.0 - 17.0 g/dL 86.0  86.2  86.2   Hematocrit 39.0 - 52.0 % 39.2  38.9  39.2   Platelets 150 - 400 K/uL 150  144  158        Latest Ref Rng & Units 05/21/2024    2:40 PM 04/24/2024    2:27 PM 03/27/2024    2:41 PM  CMP  Glucose 70 - 99 mg/dL 858  790  818   BUN 8 - 23 mg/dL 15  13  19    Creatinine 0.61 - 1.24 mg/dL 8.83  8.63  8.63   Sodium 135 - 145 mmol/L 139  138  139   Potassium 3.5 - 5.1 mmol/L 4.2  3.9  3.5   Chloride 98 - 111 mmol/L 106  105  106   CO2 22 - 32 mmol/L 23  27  27    Calcium  8.9 - 10.3  mg/dL 9.4  9.2  9.4   Total Protein 6.5 - 8.1 g/dL 6.8  6.4  6.6   Total Bilirubin 0.0 - 1.2 mg/dL 0.5  0.7  0.9   Alkaline Phos 38 - 126 U/L 63  53  50   AST 15 - 41 U/L 19  14  15    ALT 0 - 44 U/L 18  10  13       RADIOGRAPHIC STUDIES: No results found.  ASSESSMENT & PLAN Daniel Gibson is a 73 y.o. male who presents for a follow up for newly diagnosed well differentiated neuroendocrine carcinoma, low grade.   #Well differentiated neuroendocrine carcinoma, low grade: --CT guided peritoneal mass biopsy on 01/18/2022 confirmed above diagnosis. --PET DOTATATE was completed on 01/30/2022, report pending --Recommend monthly lanreotide injection q 28 days. Reviewed dose, frequency and common side effects including injection site reaction, cholelithiasis, gallbladder sludge, hyperglycemia, hypoglycemia, thyroid dysfunction and bradycardia.  --Labs from today were reviewed and require no intervention. Baseline Chromogranin levels were 159.7 on 02/01/2022.  PLAN: --Continue with monthly lanreotide injection q 28 days. --CBC and CMP every month before treatment. Chromagranin levels every 3 months --labs today show white blood cell 9.5, Hgb 13.9, MCV 85, Plt 150. and AST and ALT were within normal limits.  Creatinine 1.16 --chromogranin A last 71.8 on 10/11/2023 --last MRI scan on 03/31/2024 showed no overt evidence of progression. Repeat MR abdomen in Jan 2026.  --Urology following  for renal mass, details noted below.  --RTC in 3 months for a toxicity evaluation and continued follow up. Interval monthly lanreotide injections.   # Papillary Carcinoma of the Kidney  --biopsy of renal mass performed on 01/26/2023, showed papillary renal cell carcinoma, solid type 1, nuclear grade 2  --urology declined surgery at this time in favor of surveillance  --continue to monitor with serial scans.   Orders Placed This Encounter  Procedures   MR Abdomen Wo Contrast    Standing Status:   Future    Expected  Date:   07/07/2024    Expiration Date:   05/25/2025    What is the patient's sedation requirement?:   No Sedation    Does the patient have a pacemaker or implanted devices?:   Yes    Preferred imaging location?:   [facility] (table limit - 500lbs)    All questions were answered. The patient knows to call the clinic with any problems, questions or concerns.  I have spent a total of 30 minutes minutes of face-to-face and non-face-to-face time, preparing to see the patient,  performing a medically appropriate examination, counseling and educating the patient, ordering tests/procedures,documenting clinical information in the electronic health record, independently interpreting results and communicating results to the patient, and care coordination.  Norleen IVAR Kidney, MD Department of Hematology/Oncology [facility] Phone: [phone] Pager: [phone] Email: [email or web address]

## 2024-05-25 ENCOUNTER — Encounter: Payer: Self-pay | Admitting: Hematology and Oncology

## 2024-05-26 LAB — CHROMOGRANIN A: Chromogranin A (ng/mL): 75.4 ng/mL (ref 0.0–101.8)

## 2024-06-12 ENCOUNTER — Other Ambulatory Visit: Payer: Self-pay | Admitting: Hematology and Oncology

## 2024-06-12 DIAGNOSIS — C7A8 Other malignant neuroendocrine tumors: Secondary | ICD-10-CM

## 2024-06-17 ENCOUNTER — Other Ambulatory Visit: Payer: Self-pay

## 2024-06-17 ENCOUNTER — Other Ambulatory Visit: Payer: Self-pay | Admitting: Cardiology

## 2024-06-17 DIAGNOSIS — C7A8 Other malignant neuroendocrine tumors: Secondary | ICD-10-CM

## 2024-06-18 ENCOUNTER — Inpatient Hospital Stay: Attending: Physician Assistant

## 2024-06-18 ENCOUNTER — Inpatient Hospital Stay

## 2024-06-18 ENCOUNTER — Ambulatory Visit (HOSPITAL_COMMUNITY)
Admission: RE | Admit: 2024-06-18 | Discharge: 2024-06-18 | Disposition: A | Discharge status: Home / Self Care | Source: Ambulatory Visit | Attending: Hematology and Oncology | Admitting: Hematology and Oncology

## 2024-06-18 DIAGNOSIS — K6389 Other specified diseases of intestine: Secondary | ICD-10-CM | POA: Diagnosis not present

## 2024-06-18 DIAGNOSIS — R161 Splenomegaly, not elsewhere classified: Secondary | ICD-10-CM | POA: Diagnosis not present

## 2024-06-18 DIAGNOSIS — C7A8 Other malignant neuroendocrine tumors: Secondary | ICD-10-CM

## 2024-06-18 DIAGNOSIS — C787 Secondary malignant neoplasm of liver and intrahepatic bile duct: Secondary | ICD-10-CM | POA: Diagnosis not present

## 2024-06-18 LAB — CMP (CANCER CENTER ONLY)
ALT: 19 U/L (ref 0–44)
AST: 29 U/L (ref 15–41)
Albumin: 4.2 g/dL (ref 3.5–5.0)
Alkaline Phosphatase: 58 U/L (ref 38–126)
Anion gap: 10 (ref 5–15)
BUN: 9 mg/dL (ref 8–23)
CO2: 24 mmol/L (ref 22–32)
Calcium: 9.3 mg/dL (ref 8.9–10.3)
Chloride: 107 mmol/L (ref 98–111)
Creatinine: 1.39 mg/dL — ABNORMAL HIGH (ref 0.61–1.24)
GFR, Estimated: 54 mL/min — ABNORMAL LOW
Glucose, Bld: 163 mg/dL — ABNORMAL HIGH (ref 70–99)
Potassium: 4.1 mmol/L (ref 3.5–5.1)
Sodium: 140 mmol/L (ref 135–145)
Total Bilirubin: 0.6 mg/dL (ref 0.0–1.2)
Total Protein: 6.5 g/dL (ref 6.5–8.1)

## 2024-06-18 LAB — CBC WITH DIFFERENTIAL (CANCER CENTER ONLY)
Abs Immature Granulocytes: 0.01 K/uL (ref 0.00–0.07)
Basophils Absolute: 0.1 K/uL (ref 0.0–0.1)
Basophils Relative: 1 %
Eosinophils Absolute: 0.2 K/uL (ref 0.0–0.5)
Eosinophils Relative: 2 %
HCT: 38.7 % — ABNORMAL LOW (ref 39.0–52.0)
Hemoglobin: 13.6 g/dL (ref 13.0–17.0)
Immature Granulocytes: 0 %
Lymphocytes Relative: 8 %
Lymphs Abs: 0.5 K/uL — ABNORMAL LOW (ref 0.7–4.0)
MCH: 30.1 pg (ref 26.0–34.0)
MCHC: 35.1 g/dL (ref 30.0–36.0)
MCV: 85.6 fL (ref 80.0–100.0)
Monocytes Absolute: 1.4 K/uL — ABNORMAL HIGH (ref 0.1–1.0)
Monocytes Relative: 21 %
Neutro Abs: 4.4 K/uL (ref 1.7–7.7)
Neutrophils Relative %: 68 %
Platelet Count: 127 K/uL — ABNORMAL LOW (ref 150–400)
RBC: 4.52 MIL/uL (ref 4.22–5.81)
RDW: 12.6 % (ref 11.5–15.5)
WBC Count: 6.4 K/uL (ref 4.0–10.5)
nRBC: 0 % (ref 0.0–0.2)

## 2024-06-18 MED ORDER — GADOBUTROL 1 MMOL/ML IV SOLN
9.0000 mL | Freq: Once | INTRAVENOUS | Status: AC | PRN
  Administered 2024-06-18: 9 mL via INTRAVENOUS

## 2024-06-18 MED ORDER — LANREOTIDE ACETATE 120 MG/0.5ML ~~LOC~~ SOLN
120.0000 mg | Freq: Once | SUBCUTANEOUS | Status: AC
  Administered 2024-06-18: 120 mg via SUBCUTANEOUS
  Filled 2024-06-18: qty 0.5

## 2024-07-08 ENCOUNTER — Telehealth (HOSPITAL_BASED_OUTPATIENT_CLINIC_OR_DEPARTMENT_OTHER): Payer: Self-pay

## 2024-07-08 ENCOUNTER — Telehealth: Payer: Self-pay

## 2024-07-08 ENCOUNTER — Other Ambulatory Visit (HOSPITAL_BASED_OUTPATIENT_CLINIC_OR_DEPARTMENT_OTHER): Payer: Self-pay

## 2024-07-08 ENCOUNTER — Telehealth: Payer: Self-pay | Admitting: Hematology and Oncology

## 2024-07-08 ENCOUNTER — Other Ambulatory Visit (HOSPITAL_COMMUNITY): Payer: Self-pay

## 2024-07-08 ENCOUNTER — Other Ambulatory Visit: Payer: Self-pay

## 2024-07-08 MED ORDER — EVEROLIMUS 10 MG PO TABS
10.0000 mg | ORAL_TABLET | Freq: Every day | ORAL | 0 refills | Status: AC
  Filled 2024-07-08 – 2024-07-18 (×2): qty 28, 28d supply, fill #0

## 2024-07-08 NOTE — Telephone Encounter (Signed)
 Called Mr. Beamer to discuss the results of his most recent MRI.  Findings are most consistent with progression of disease with enlarging of the primary mass as well as new punctate lesions concerning for new areas of disease.  At this time I do believe it would be reasonable to transition therapy to everolimus  10 mg p.o. daily.  Additionally we discussed other options including continued monitoring with short interval repeat imaging.  I would recommend transitioning to everolimus  therapy.  He voices understanding and agreement and was willing and able to proceed with everolimus  after hearing the risks and benefits.  We will plan to see him as scheduled on 07/17/2024 to discuss further.  We will call in the pills now in order to get the ball rolling to assure this is something that his insurance will cover at a reasonable cost.  Norleen IVAR Kidney, MD Department of Hematology/Oncology [facility] at [facility] Phone: [phone] Pager: [phone] Email: [email or web address]

## 2024-07-08 NOTE — Telephone Encounter (Signed)
 PA already being taken care of by ONC Patient Advocate

## 2024-07-08 NOTE — Telephone Encounter (Signed)
 Where is this request coming from? [name]  Medication: Everolimus  10mg   Prior authorization required? Yes If YES, on primary or secondary insurance? Primary Comments:

## 2024-07-08 NOTE — Telephone Encounter (Signed)
 Oral Oncology Patient Advocate Encounter   Received notification that prior authorization for everolimus  (AFINITOR ) 10 MG tablet  is required.   PA submitted on 07/08/24 Key [id number] Status is pending     Lucie Lamer, CPhT [place]  [facility] Oncology Pharmacy Patient Advocate Specialist [facility] [place] Phone: [phone]  Fax: [phone] [email or web address]

## 2024-07-09 ENCOUNTER — Telehealth: Payer: Self-pay

## 2024-07-09 NOTE — Telephone Encounter (Addendum)
 Oral Oncology Pharmacist Encounter  Received new prescription for Afinitor  (everolimus ) for the treatment of neuroendocrine carcinoma metastatic to liver, planned duration until disease progression or unacceptable toxicity.  Labs from 06/18/2024 (CMP, CrCl 59.04 mL/min, and CBC) assessed, no interventions needed. Prescription dose and frequency assessed for appropriateness.   Current medication list in Epic reviewed, DDIs with Afinitor  (everolimus ) identified:  - concurrent use of Afinitor  (everolimus ) and P-gp inhibitor (Coreg  (carvedilol )) may increase blood concentrations of everolimus . Monitor for change in efficacy of the medication and no dose adjustments required at this time.  - concurrent use of Afinitor  (everolimus ) with anti-diabetic medications (glimepiride, metformin , and Jardiance) can decrease the efficacy of anti-diabetic medications. Monitor blood glucose and no dose adjustments required at this time.   Evaluated chart and no patient barriers to medication adherence noted.   Patient agreement for treatment documented in MD telephone note on 07/08/2024.  Prescription has been e-scribed to the [facility] for benefits analysis and approval.  Oral Oncology Clinic will continue to follow for insurance authorization, copayment issues, initial counseling and start date.  Damien Henry, M.S.  PharmD Candidate Class of 2026 [employer]  07/09/2024 10:36 AM Oral Oncology Clinic [phone]

## 2024-07-14 ENCOUNTER — Other Ambulatory Visit (HOSPITAL_COMMUNITY): Payer: Self-pay

## 2024-07-14 NOTE — Telephone Encounter (Signed)
 Oral Oncology Patient Advocate Encounter  Prior Authorization for everolimus  (AFINITOR ) 10 MG tablet  has been approved.    PA# [id number] Effective dates: 07/08/24 through 06/25/25  Patients co-pay is $2,021.58.    Lucie Lamer, CPhT [place]  [facility] Oncology Pharmacy Patient Advocate Specialist [facility] [place] Phone: [phone]  Fax: [phone] [email or web address]

## 2024-07-16 ENCOUNTER — Other Ambulatory Visit (HOSPITAL_COMMUNITY): Payer: Self-pay

## 2024-07-17 ENCOUNTER — Other Ambulatory Visit: Payer: Self-pay | Admitting: Hematology and Oncology

## 2024-07-17 ENCOUNTER — Inpatient Hospital Stay

## 2024-07-17 ENCOUNTER — Inpatient Hospital Stay: Attending: Physician Assistant | Admitting: Hematology and Oncology

## 2024-07-17 ENCOUNTER — Other Ambulatory Visit: Payer: Self-pay | Admitting: Cardiology

## 2024-07-17 VITALS — BP 136/66 | HR 63 | Temp 97.6°F | Resp 13 | Wt 186.5 lb

## 2024-07-17 DIAGNOSIS — C7A8 Other malignant neuroendocrine tumors: Secondary | ICD-10-CM | POA: Diagnosis present

## 2024-07-17 DIAGNOSIS — C649 Malignant neoplasm of unspecified kidney, except renal pelvis: Secondary | ICD-10-CM | POA: Diagnosis not present

## 2024-07-17 DIAGNOSIS — I6523 Occlusion and stenosis of bilateral carotid arteries: Secondary | ICD-10-CM

## 2024-07-17 DIAGNOSIS — E118 Type 2 diabetes mellitus with unspecified complications: Secondary | ICD-10-CM

## 2024-07-17 DIAGNOSIS — I493 Ventricular premature depolarization: Secondary | ICD-10-CM

## 2024-07-17 DIAGNOSIS — Z79899 Other long term (current) drug therapy: Secondary | ICD-10-CM | POA: Diagnosis not present

## 2024-07-17 DIAGNOSIS — C7B8 Other secondary neuroendocrine tumors: Secondary | ICD-10-CM | POA: Diagnosis not present

## 2024-07-17 DIAGNOSIS — I25119 Atherosclerotic heart disease of native coronary artery with unspecified angina pectoris: Secondary | ICD-10-CM

## 2024-07-17 DIAGNOSIS — I119 Hypertensive heart disease without heart failure: Secondary | ICD-10-CM

## 2024-07-17 DIAGNOSIS — E785 Hyperlipidemia, unspecified: Secondary | ICD-10-CM

## 2024-07-17 LAB — CBC WITH DIFFERENTIAL (CANCER CENTER ONLY)
Abs Immature Granulocytes: 0.01 K/uL (ref 0.00–0.07)
Basophils Absolute: 0.1 K/uL (ref 0.0–0.1)
Basophils Relative: 1 %
Eosinophils Absolute: 0.4 K/uL (ref 0.0–0.5)
Eosinophils Relative: 4 %
HCT: 39.2 % (ref 39.0–52.0)
Hemoglobin: 13.6 g/dL (ref 13.0–17.0)
Immature Granulocytes: 0 %
Lymphocytes Relative: 8 %
Lymphs Abs: 0.6 K/uL — ABNORMAL LOW (ref 0.7–4.0)
MCH: 30.3 pg (ref 26.0–34.0)
MCHC: 34.7 g/dL (ref 30.0–36.0)
MCV: 87.3 fL (ref 80.0–100.0)
Monocytes Absolute: 0.8 K/uL (ref 0.1–1.0)
Monocytes Relative: 9 %
Neutro Abs: 6.5 K/uL (ref 1.7–7.7)
Neutrophils Relative %: 78 %
Platelet Count: 147 K/uL — ABNORMAL LOW (ref 150–400)
RBC: 4.49 MIL/uL (ref 4.22–5.81)
RDW: 13.2 % (ref 11.5–15.5)
WBC Count: 8.3 K/uL (ref 4.0–10.5)
nRBC: 0 % (ref 0.0–0.2)

## 2024-07-17 LAB — CMP (CANCER CENTER ONLY)
ALT: 12 U/L (ref 0–44)
AST: 18 U/L (ref 15–41)
Albumin: 4.3 g/dL (ref 3.5–5.0)
Alkaline Phosphatase: 51 U/L (ref 38–126)
Anion gap: 10 (ref 5–15)
BUN: 13 mg/dL (ref 8–23)
CO2: 24 mmol/L (ref 22–32)
Calcium: 9.7 mg/dL (ref 8.9–10.3)
Chloride: 103 mmol/L (ref 98–111)
Creatinine: 1.31 mg/dL — ABNORMAL HIGH (ref 0.61–1.24)
GFR, Estimated: 58 mL/min — ABNORMAL LOW
Glucose, Bld: 150 mg/dL — ABNORMAL HIGH (ref 70–99)
Potassium: 4.4 mmol/L (ref 3.5–5.1)
Sodium: 138 mmol/L (ref 135–145)
Total Bilirubin: 0.5 mg/dL (ref 0.0–1.2)
Total Protein: 6.6 g/dL (ref 6.5–8.1)

## 2024-07-17 NOTE — Progress Notes (Signed)
 "  Daniel Gibson [phone]   Fax:([phone]  PROGRESS NOTE:  Patient Care Team: Stephanie Charlene CROME, MD as PCP - General (Family Medicine) Blanca Elsie RAMAN, MD as Attending Physician (Cardiology) Jason Charleston, MD (Inactive) as Attending Physician (Radiation Oncology) Alline Lenis, MD (Inactive) as Attending Physician (Urology)  CHIEF COMPLAINTS/PURPOSE OF CONSULTATION:  Right renal mass and peritoneal nodularity  ONCOLOGIC HISTORY: Presented to the [facility] ER on 12/11/2021 for painless hematuria and urinary urgency.  Work-up included urinalysis showed large leukocyte esterase, blood, protein, positive nitrates.  Urine culture grew E. coli.  Patient was treated with cefdinir.  He underwent CT imaging that showed a nonobstructing 6 mm stone in the interpolar left Gibson, indeterminate 2.9 x 3.4 x 4.3 cm lesion in the upper pole right Gibson, multiple peritoneal and omental nodules concerning for peritoneal carcinomatosis. 01/10/2022: CT chest: Incidental right lower lobe segmental and subsegmental pulmonary emboli.Minimal clot burden without right heart strain.No evidence of intrathoracic metastases.Small focus of ground-glass airspace disease within the right upper lobe likely inflammatory or infectious. Started on Eliquis  therapy 01/18/2022: CT guided biopsy of peritoneal mass: Well-differentiated low-grade neuroendocrine neoplasm with features  suspicious carcinoid tumor. Ki-67 shows very low proliferative index score.  01/30/2022: PET DOTATATE showed extensive tracer avid peritoneal metastasis and tracer avid liver metastasis to the posterior right hepatic lobe. 02/01/2022: Start lanreotide monthly injections 6.   01/26/2023: CT renal biopsy showed low grade papillary carcinoma of the Gibson. Urology opted for surveillance rather than surgery.  06/18/2024: MRI of the liver shows concern for progression of disease with  enlarged metastasis in the inferior  right lobe of the liver and  punctuate diffusion restricting lesions scattered throughout the liver 07/17/2024: start everolimus  10 mg PO daily. Transition off lanreotide due to progression on CT scan.   INTERIM HISTORY:  Daniel Gibson returns today for a follow up visit for a low grade neuroendocrine carcinoma.   On exam today Daniel Gibson reports he has had no major changes in his health interim since our last visit.  Unfortunately his brother did pass away and he is currently been working to [employer] out of his old house.  He reports that his been tired some work but he is able to do it.  He is having some occasional fatigue but overall feels well.  He currently denies any fevers, chills, sweats, nausea, vomiting or diarrhea.  He is not having any abdominal discomfort.  Full 10 point ROS is otherwise negative.  The bulk of our discussion focused on the medication everolimus  and what to expect moving forward.  Additionally he will meeting with our pharmacy team to discuss the medication further.  MEDICAL HISTORY:  Past Medical History:  Diagnosis Date   Abnormal cardiac CT angiography    Allergy to alpha-gal 12/27/2021   CAD (coronary artery disease), native coronary artery 04/08/2018   Cath 10/1991 in setting of inferior MI  [facility]      normal Left main, scattered irregularities LAD, 40% stenosis proximal Ramus, occluded OM 2, 60% stenosis proximal PDA, dominant CFX, small and nondominant RCA     Carotid artery disease 05/08/2012   CEA on left 2013   Coronary artery disease    Diabetes mellitus type 2, noninsulin dependent (HCC) 04/08/2018   Diabetes mellitus without complication (HCC)    Food allergy    Alpha Gal allergy    GERD (gastroesophageal reflux disease)    Hyperlipidemia    Hypertensive heart disease without CHF  04/08/2018   Myocardial infarction [facility]) 1993   medically managed   Neuroendocrine carcinoma metastatic to liver [facility])    Neuroendocrine carcinoma metastatic  to multiple sites (HCC) 01/26/2022   Obesity (BMI 30-39.9) 04/08/2018   Old myocardial infarct 04/08/2018   Peritoneal lesion 01/04/2022   Prostate cancer (HCC) 08/07/2016   Pulmonary embolism (HCC)    PVC's (premature ventricular contractions) 04/08/2018   Pyelonephritis 12/27/2021   Renal calculus, left 12/27/2021   S/P CABG x 3 10/18/2020   Thrombocytopenia     SURGICAL HISTORY: Past Surgical History:  Procedure Laterality Date   APPENDECTOMY     CAROTID ENDARTERECTOMY Right 06/07/2012   cea   CIRCUMCISION N/A 03/20/2024   Procedure: CIRCUMCISION, ADULT;  Surgeon: Alvaro Ricardo KATHEE Mickey., MD;  Location: [facility];  Service: Urology;  Laterality: N/A;   CORONARY ARTERY BYPASS GRAFT N/A 10/18/2020   Procedure: CORONARY ARTERY BYPASS GRAFTING (CABG)x3. LEFT INTERNAL MAMMARY ARTERY. RIGHT ENDOSCOPIC SAPHENOUS VEIN HARVESTING.;  Surgeon: German Bartlett PEDLAR, MD;  Location: [facility] OR;  Service: Open Heart Surgery;  Laterality: N/A;   CYSTOSCOPY WITH RETROGRADE PYELOGRAM, URETEROSCOPY AND STENT PLACEMENT Right 01/10/2023   Procedure: CYSTOSCOPY WITH RETROGRADE PYELOGRAM, RIGHT DIAGNOSTIC URETEROSCOPY;  Surgeon: Alvaro Ricardo KATHEE Mickey., MD;  Location: [facility];  Service: Urology;  Laterality: Right;   ENDARTERECTOMY  06/07/2012   Procedure: ENDARTERECTOMY CAROTID;  Surgeon: Lonni GORMAN Blade, MD;  Location: [facility];  Service: Vascular;  Laterality: Right;  Right Carotid Endarterectomy with Patch Angioplasty   LEFT HEART CATH AND CORONARY ANGIOGRAPHY N/A 10/07/2020   Procedure: LEFT HEART CATH AND CORONARY ANGIOGRAPHY;  Surgeon: Burnard Debby LABOR, MD;  Location: [facility] INVASIVE CV LAB;  Service: Cardiovascular;  Laterality: N/A;   TEE WITHOUT CARDIOVERSION N/A 10/18/2020   Procedure: TRANSESOPHAGEAL ECHOCARDIOGRAM (TEE);  Surgeon: German Bartlett PEDLAR, MD;  Location: [facility] OR;  Service: Open Heart Surgery;  Laterality: N/A;   TRANSPERINEAL IMPLANT OF RADIATION SEEDS W/ ULTRASOUND      SOCIAL HISTORY: Social  History   Socioeconomic History   Marital status: Married    Spouse name: Not on file   Number of children: Not on file   Years of education: Not on file   Highest education level: Not on file  Occupational History   Not on file  Tobacco Use   Smoking status: Never   Smokeless tobacco: Never  Vaping Use   Vaping status: Never Used  Substance and Sexual Activity   Alcohol use: No   Drug use: No   Sexual activity: Not Currently  Other Topics Concern   Not on file  Social History Narrative   Not on file   Social Drivers of Health   Tobacco Use: Low Risk (03/20/2024)   Patient History    Smoking Tobacco Use: Never    Smokeless Tobacco Use: Never    Passive Exposure: Not on file  Financial Resource Strain: Not on file  Food Insecurity: Not on file  Transportation Needs: Not on file  Physical Activity: Not on file  Stress: Not on file  Social Connections: Not on file  Intimate Partner Violence: Not on file  Depression (EYV7-0): Not on file  Alcohol Screen: Not on file  Housing: Not on file  Utilities: Not on file  Health Literacy: Not on file    FAMILY HISTORY: Family History  Problem Relation Age of Onset   Cancer Mother    Hyperlipidemia Mother    Hypertension Mother    Heart attack Father    Heart  disease Father        Heart Disease before age 43   Hyperlipidemia Father    Hypertension Father    Diabetes Sister    Heart disease Sister        before age 68   Hypertension Sister    Hyperlipidemia Sister    Diabetes Brother    Heart disease Brother        before age 57   Hyperlipidemia Brother    Hypertension Brother     ALLERGIES:  is allergic to ace inhibitors, albumin (human), bovine (beef) protein-containing drug products, gelfoam-jmi sponge, heparin , porcine (pork) protein-containing drug products, sheep-derived products, and tetanus toxoid-containing vaccines.  MEDICATIONS:  Current Outpatient Medications  Medication Sig Dispense Refill    amLODipine  (NORVASC ) 10 MG tablet Take 10 mg by mouth in the morning.     carvedilol  (COREG ) 25 MG tablet Take 12.5 mg by mouth 2 (two) times daily with a meal.     dexamethasone  (DECADRON ) 0.5 MG/5ML solution Take 10 mLs (1 mg total) by mouth in the morning, at noon, in the evening, and at bedtime. Swish 10mLs in mouth for two minutes then spit. Do not swallow. Use for first 2 months of therapy. 500 mL 4   ELIQUIS  5 MG TABS tablet TAKE 1 TABLET BY MOUTH TWICE A DAY 60 tablet 5   EPINEPHrine  (EPIPEN  2-PAK) 0.3 mg/0.3 mL IJ SOAJ injection Use as directed for life-threatening allergic reaction. 2 each 3   everolimus  (AFINITOR ) 10 MG tablet Take 1 tablet (10 mg total) by mouth daily. 30 tablet 0   ezetimibe  (ZETIA ) 10 MG tablet TAKE 1 TABLET BY MOUTH EVERY DAY 90 tablet 0   fluticasone (FLONASE) 50 MCG/ACT nasal spray Place 2 sprays into both nostrils daily as needed for allergies.     glimepiride (AMARYL) 4 MG tablet Take 4 mg by mouth in the morning and at bedtime.     JARDIANCE 10 MG TABS tablet Take 10 mg by mouth daily.     metFORMIN  (GLUCOPHAGE ) 1000 MG tablet Take 1,000 mg by mouth 2 (two) times daily with a meal.     oxyCODONE -acetaminophen  (PERCOCET) 5-325 MG tablet Take 1 tablet by mouth every 6 (six) hours as needed for moderate pain (pain score 4-6) or severe pain (pain score 7-10) (post-operatively). 15 tablet 0   pantoprazole  (PROTONIX ) 40 MG tablet TAKE 1 TABLET BY MOUTH EVERY DAY 90 tablet 0   polyethylene glycol (MIRALAX / GLYCOLAX) 17 g packet Take 17 g by mouth daily as needed (constipation).     rosuvastatin  (CRESTOR ) 40 MG tablet TAKE 1 TABLET BY MOUTH EVERY DAY 90 tablet 1   senna-docusate (SENOKOT-S) 8.6-50 MG tablet Take 1 tablet by mouth 2 (two) times daily. While taking stong pain meds to prevent constipaiton (Patient taking differently: Take 1 tablet by mouth 2 (two) times daily as needed for moderate constipation.) 10 tablet 0   senna-docusate (SENOKOT-S) 8.6-50 MG tablet  Take 1 tablet by mouth 2 (two) times daily. While taking strong pain meds to prevent constipation. 10 tablet 0   Tamsulosin  HCl (FLOMAX ) 0.4 MG CAPS Take 0.8 mg by mouth at bedtime.     traZODone  (DESYREL ) 50 MG tablet Take 50 mg by mouth at bedtime as needed for sleep.     Trospium  Chloride 60 MG CP24 Take 1 capsule by mouth daily.     valsartan  (DIOVAN ) 160 MG tablet TAKE 1 TABLET BY MOUTH EVERY DAY 90 tablet 0   No current facility-administered  medications for this visit.    REVIEW OF SYSTEMS:   Constitutional: ( - ) fevers, ( - )  chills , ( - ) night sweats Eyes: ( - ) blurriness of vision, ( - ) double vision, ( - ) watery eyes Ears, nose, mouth, throat, and face: ( - ) mucositis, ( - ) sore throat Respiratory: ( - ) cough, ( - ) dyspnea, ( - ) wheezes Cardiovascular: ( - ) palpitation, ( - ) chest discomfort, ( - ) lower extremity swelling Gastrointestinal:  ( - ) nausea, ( - ) heartburn, ( - ) change in bowel habits Skin: ( - ) abnormal skin rashes Lymphatics: ( - ) new lymphadenopathy, ( - ) easy bruising Neurological: ( - ) numbness, ( - ) tingling, ( - ) new weaknesses Behavioral/Psych: ( - ) mood change, ( - ) new changes  All other systems were reviewed with the patient and are negative.  PHYSICAL EXAMINATION: ECOG PERFORMANCE STATUS: 0 - Asymptomatic  Vitals:   07/17/24 1452  BP: 136/66  Pulse: 63  Resp: 13  Temp: 97.6 F (36.4 C)  SpO2: 95%     Filed Weights   07/17/24 1452  Weight: 186 lb 8 oz (84.6 kg)     GENERAL: well appearing Caucasian male in NAD  SKIN: skin color, texture, turgor are normal, no rashes or significant lesions EYES: conjunctiva are pink and non-injected, sclera clear LUNGS: clear to auscultation and percussion with normal breathing effort HEART: regular rate & rhythm and no murmurs and no lower extremity edema Musculoskeletal: no cyanosis of digits and no clubbing  PSYCH: alert & oriented x 3, fluent speech NEURO: no focal  motor/sensory deficits  LABORATORY DATA:  I have reviewed the data as listed    Latest Ref Rng & Units 07/17/2024    2:30 PM 06/18/2024   11:04 AM 05/21/2024    2:40 PM  CBC  WBC 4.0 - 10.5 K/uL 8.3  6.4  9.5   Hemoglobin 13.0 - 17.0 g/dL 86.3  86.3  86.0   Hematocrit 39.0 - 52.0 % 39.2  38.7  39.2   Platelets 150 - 400 K/uL 147  127  150        Latest Ref Rng & Units 07/17/2024    2:30 PM 06/18/2024   11:04 AM 05/21/2024    2:40 PM  CMP  Glucose 70 - 99 mg/dL 849  836  858   BUN 8 - 23 mg/dL 13  9  15    Creatinine 0.61 - 1.24 mg/dL 8.68  8.60  8.83   Sodium 135 - 145 mmol/L 138  140  139   Potassium 3.5 - 5.1 mmol/L 4.4  4.1  4.2   Chloride 98 - 111 mmol/L 103  107  106   CO2 22 - 32 mmol/L 24  24  23    Calcium  8.9 - 10.3 mg/dL 9.7  9.3  9.4   Total Protein 6.5 - 8.1 g/dL 6.6  6.5  6.8   Total Bilirubin 0.0 - 1.2 mg/dL 0.5  0.6  0.5   Alkaline Phos 38 - 126 U/L 51  58  63   AST 15 - 41 U/L 18  29  19    ALT 0 - 44 U/L 12  19  18       RADIOGRAPHIC STUDIES: No results found.   ASSESSMENT & PLAN DAWSYN RAMSARAN is a 74 y.o. male who presents for a follow up for newly diagnosed well differentiated neuroendocrine carcinoma,  low grade.   #Well differentiated neuroendocrine carcinoma, low grade: --CT guided peritoneal mass biopsy on 01/18/2022 confirmed above diagnosis. --PET DOTATATE was completed on 01/30/2022, report pending --Recommend monthly lanreotide injection q 28 days. Reviewed dose, frequency and common side effects including injection site reaction, cholelithiasis, gallbladder sludge, hyperglycemia, hypoglycemia, thyroid dysfunction and bradycardia.  --Labs from today were reviewed and require no intervention. Baseline Chromogranin levels were 159.7 on 02/01/2022.  PLAN: --d/c lanreotide shots due to progression. Transition to Everolimus  10 mg PO daily  --CBC and CMP every month before treatment. Chromagranin levels every 3 months --labs today show white blood  cell 8.3, Hgb 13.6, MCV 87.3, Plt 147  --chromogranin A last 71.8 on 10/11/2023 --last MRI scan on 06/18/2024 showed concern for new sites of disease and progression. Repeat MR abdomen in April 2026 ( 3 months after starting everolimus ).  --Urology following for renal mass, details noted below.  --RTC in 1 months for a toxicity evaluation   # Papillary Carcinoma of the Gibson  --biopsy of renal mass performed on 01/26/2023, showed papillary renal cell carcinoma, solid type 1, nuclear grade 2  --urology declined surgery at this time in favor of surveillance  --continue to monitor with serial scans.   No orders of the defined types were placed in this encounter.   All questions were answered. The patient knows to call the clinic with any problems, questions or concerns.  I have spent a total of 30 minutes minutes of face-to-face and non-face-to-face time, preparing to see the patient,  performing a medically appropriate examination, counseling and educating the patient, ordering tests/procedures,documenting clinical information in the electronic health record, independently interpreting results and communicating results to the patient, and care coordination.  Daniel IVAR Kidney, MD Department of Hematology/Oncology [facility] Phone: [phone] Pager: [phone] Email: [email or web address] "

## 2024-07-18 ENCOUNTER — Telehealth: Payer: Self-pay

## 2024-07-18 ENCOUNTER — Encounter: Payer: Self-pay | Admitting: Hematology and Oncology

## 2024-07-18 ENCOUNTER — Other Ambulatory Visit (HOSPITAL_COMMUNITY): Payer: Self-pay

## 2024-07-18 ENCOUNTER — Other Ambulatory Visit: Payer: Self-pay

## 2024-07-18 DIAGNOSIS — C7A8 Other malignant neuroendocrine tumors: Secondary | ICD-10-CM

## 2024-07-18 LAB — CHROMOGRANIN A: Chromogranin A (ng/mL): 72 ng/mL (ref 0.0–101.8)

## 2024-07-18 MED ORDER — DEXAMETHASONE 0.5 MG/5ML PO SOLN
1.0000 mg | Freq: Four times a day (QID) | ORAL | 4 refills | Status: AC
  Filled 2024-07-18: qty 500, 13d supply, fill #0

## 2024-07-18 NOTE — Progress Notes (Signed)
 Specialty Pharmacy Initial Fill Coordination Note  Daniel Gibson is a 74 y.o. male contacted today regarding refills of specialty medication(s) Everolimus  (AFINITOR ) .  Patient requested [name] at [facility] [place]  on 07/18/24   Medication will be filled on 07/19/23.   Patient is aware of $0.00 copayment.   Add dexamethasone  solution as well to pickup.

## 2024-07-18 NOTE — Telephone Encounter (Signed)
 Oral Oncology Patient Advocate Encounter  Was successful in securing patient a $15000 grant from [facility] to provide copayment coverage for Afinitor .  This will keep the out of pocket expense at $0.     [employer] ID: [id number]   The billing information is as follows and has been shared with [facility].    RxBin: [id number] PCN: [id number] Member ID: [id number] Group ID: [id number] Dates of Eligibility: 06/18/24 through 06/17/25  Fund:  Neuroendocrine Tumors  Daniel Gibson, CPhT [place]  [facility] Oncology Pharmacy Patient Advocate Specialist II Daniel Gibson Phone: [phone]  Fax: [phone] [email or web address]

## 2024-07-18 NOTE — Telephone Encounter (Signed)
 Oncology Pharmacist Encounter   Prescription for dexamethasone  solution mouthwash has been sent after discussion with Dr. Federico to prevent stomatitis when starting everolimus .   Mariluz Crespo, PharmD Hematology/Oncology Clinical Pharmacist [facility] [phone]

## 2024-07-18 NOTE — Progress Notes (Signed)
 Patient counseled on everolimus  and dexamethasone  mouthwash in clinic encounter on 07/17/24.   Pascuala Klutts, PharmD Hematology/Oncology Clinical Pharmacist [facility] [phone]

## 2024-07-22 NOTE — Patient Instructions (Signed)
 [facility] [place] - A DEPT OF [facility]  Thank you for choosing [facility] to provide your oncology/hematology care and for allowing us  to participate in your care today!  As a reminder, we spoke about the following today: Afinitor  (everolimus ) for the treatment of neuroendocrine carcinoma metastatic to liver, planned duration until disease progression or unacceptable toxicity.   Treatment goal: Control  Medication handout has been provided.   **For oral cancer medication prescription refill requests, contact your pharmacy and they will contact our office if needed. Allow 5-7 days for refills to be completed by your specialty pharmacy.    Cancer Center General Instructions:  If you have an appointment at the [facility], please go directly to the Cancer Center and check in at the registration area.  We strive to give you quality time with your provider. You may need to reschedule your appointment if you arrive late (15 or more minutes).  Arriving late affects you and other patients whose appointments are after yours.  Also, if you miss three or more appointments without notifying the office, you may be dismissed from the clinic at the provider's discretion.      BELOW ARE SYMPTOMS THAT SHOULD BE REPORTED IMMEDIATELY: *FEVER GREATER THAN 100.4 F (38 C) OR HIGHER *CHILLS OR SWEATING *NAUSEA AND VOMITING THAT IS NOT CONTROLLED WITH YOUR NAUSEA MEDICATION *UNUSUAL SHORTNESS OF BREATH *UNUSUAL BRUISING OR BLEEDING *URINARY PROBLEMS (pain or burning when urinating, or frequent urination) *BOWEL PROBLEMS (unusual diarrhea, constipation, pain near the anus) TENDERNESS IN MOUTH AND THROAT WITH OR WITHOUT PRESENCE OF ULCERS (sore throat, sores in mouth, or a toothache) UNUSUAL RASH, SWELLING OR PAIN  UNUSUAL VAGINAL DISCHARGE OR ITCHING   Items with * indicate a potential emergency and should be followed up as soon as possible or go to the  Emergency Department if any problems should occur.  Please show the CHEMOTHERAPY ALERT CARD at check-in to the Emergency Department and triage nurse.  Should you have questions after your visit or need to cancel or reschedule your appointment, please contact [facility]  - A DEPT OF [facility]  [phone] and follow the prompts.  Office hours are 8:00 a.m. to 4:30 p.m. Monday - Friday. Please note that voicemails left after 4:00 p.m. may not be returned until the following business day.  We are closed weekends and major holidays. You have access to a nurse at all times for urgent questions. Please call the main number to the clinic [phone] and follow the prompts.  For any non-urgent questions, you may also contact your provider using MyChart. We now offer e-Visits for anyone 22 and older to request care online for non-urgent symptoms. For details visit [email or web address].   Also download the MyChart app! Go to the app store, search MyChart, open the app, select [place], and log in with your MyChart username and password.

## 2024-07-22 NOTE — Progress Notes (Addendum)
 Oncology Clinical Pharmacist Practitioner Education Encounter  I spoke with patient in clinic for overview of: Afinitor  (everolimus ) for the treatment of neuroendocrine carcinoma metastatic to liver, planned duration until disease progression or unacceptable toxicity.   Treatment goal: Control  Counseled patient on administration, dosing, side effects, monitoring, drug-food interactions, safe handling, storage, and disposal.  Patient will take Afinitor  10mg  tablets, 1 tablet by mouth once daily, with water , without regard to food. Patient understands to take Afinitor  consistently with regards to food and at approximately the same time each day. Patient knows to aviod grapefruit or grapefruit juice while on therapy with Afinitor .  Afinitor  start date: 07/20/2024 (patient to pick up both afinitor  and dexamethasone  mouthwash on 07/19/24)  Adverse effects include but are not limited to: mouth sores, GI upset, nausea, diarrhea, constipation, rash, increased blood sugars, decreased blood counts, increased blood pressure, pulmonary toxicity, and edema.   Dexamethasone  mouthwash for the prevention of stomatitis has been e-scribed to local pharmacy. We discussed appropriate use of mouthwash and duration of stomatitis prevention. Patient did not have any questions regarding the mouthwash directions.   Reviewed with patient importance of keeping a medication schedule and plan for any missed doses. Notified patient that we will need to monitor patients glucose while on therapy as this may decrease how well the anti-diabetic medications work. No barriers to medication adherence identified. Medication reconciliation performed and medication/allergy list updated.  Distress thermometer not completed during in person visit as patient has been on previous lines of therapy.   Communication and Learning Assessment Primary learner: Patient, spouse, and son  Barriers to learning: No barriers Preferred language:  English Learning preferences: Listening Reading  All questions answered. Patient voiced understanding and appreciation. Medication education handout given to patient. Patient knows to call the office with questions or concerns. Oral Chemotherapy Clinic phone number provided to patient.   I spent a total of 30 face-to-face time with patient and patients family discussing medication and counseling as discussed in this progress note.   Kensy Blizard, PharmD Hematology/Oncology Clinical Pharmacist [facility] [phone]

## 2024-07-24 ENCOUNTER — Encounter: Payer: Self-pay | Admitting: Hematology and Oncology

## 2024-07-27 ENCOUNTER — Other Ambulatory Visit: Payer: Self-pay | Admitting: Cardiology

## 2024-07-31 NOTE — Progress Notes (Unsigned)
 " Cardiology Office Note:    Date:  08/01/2024   ID:  Daniel Gibson, DOB 1950/11/10, MRN [id number]  PCP:  Stephanie Charlene LITTIE, MD  Cardiologist:  Redell Leiter, MD    Referring MD: Stephanie Charlene LITTIE, MD    ASSESSMENT:    1. Palpitations   2. Coronary artery disease involving native heart with angina pectoris, unspecified vessel or lesion type   3. Hypertensive heart disease without CHF   4. Hyperlipidemia, unspecified hyperlipidemia type   5. PVC's (premature ventricular contractions)   6. Chronic anticoagulation   7. Right-sided carotid artery disease, unspecified type    PLAN:    In order of problems listed above:  He is greatest concern today occurring despite his beta-blocker therapy we will go ahead and apply an event monitor and define what we are dealing with here and focus treatment Stable CAD no anginal discomfort he is on good medical therapy including his long-term anticoagulant beta-blocker statin and Zetia  I do not think he requires an ischemia evaluation Well-controlled continues treatment including his ARB calcium  channel blocker Ideal lipids continue current treatment labs are followed with his PCP Continue his beta-blocker and await monitor results His active cancer pulmonary embolism continue long-term indefinite anticoagulation   Next appointment: 3 months   Medication Adjustments/Labs and Tests Ordered: Current medicines are reviewed at length with the patient today.  Concerns regarding medicines are outlined above.  Orders Placed This Encounter  Procedures   LONG TERM MONITOR (3-14 DAYS)   No orders of the defined types were placed in this encounter.    History of Present Illness:    Daniel Gibson is a 74 y.o. male with a hx of CAD with CABG 10/18/2020 pulmonary embolism with ongoing anticoagulation neuroendocrine carcinoma hypertension hyperlipidemia and PVCs last seen 07/17/2023 with a new diagnosis of papillary carcinoma of the kidney advised  surveillance..   Compliance with diet, lifestyle and medications: Yes  He is struggling under the burden of his 2 cancers treatment and concerns about recent change and progression of liver involvement  Associated this he is having more frequent episodes of rapid heartbeat he has not activated his Apple watch has not caught it we will take advantage and place an event monitor on him today to define whether he is having PVCs or is he is concerned atrial fibrillation Otherwise from a cardiology perspective he is doing well He is had no angina edema shortness of breath or syncope He tolerates his lipid-lowering therapy combined statin and Zetia  with exceptional results lipids in November LDL 42 cholesterol 106 non-HDL cholesterol 64. He has had no bleeding complication with his continued anticoagulation with active cancer and pulmonary embolism Past Medical History:  Diagnosis Date   Abnormal cardiac CT angiography    Allergy to alpha-gal 12/27/2021   CAD (coronary artery disease), native coronary artery 04/08/2018   Cath 10/1991 in setting of inferior MI  [facility]      normal Left main, scattered irregularities LAD, 40% stenosis proximal Ramus, occluded OM 2, 60% stenosis proximal PDA, dominant CFX, small and nondominant RCA     Carotid artery disease 05/08/2012   CEA on left 2013   Coronary artery disease    Diabetes mellitus type 2, noninsulin dependent (HCC) 04/08/2018   Diabetes mellitus without complication (HCC)    Food allergy    Alpha Gal allergy    GERD (gastroesophageal reflux disease)    Hyperlipidemia    Hypertensive heart disease without CHF 04/08/2018  Myocardial infarction [facility]) 1993   medically managed   Neuroendocrine carcinoma metastatic to multiple sites (HCC) 01/26/2022   Obesity (BMI 30-39.9) 04/08/2018   Old myocardial infarct 04/08/2018   Peritoneal lesion 01/04/2022   Prostate cancer (HCC) 08/07/2016   Pulmonary embolism (HCC)    PVC's (premature ventricular  contractions) 04/08/2018   Pyelonephritis 12/27/2021   Renal calculus, left 12/27/2021   S/P CABG x 3 10/18/2020   Thrombocytopenia     Current Medications: Active Medications[1]    EKGs/Labs/Other Studies Reviewed:    The following studies were reviewed today:  Cardiac Studies & Procedures   ______________________________________________________________________________________________ CARDIAC CATHETERIZATION  CARDIAC CATHETERIZATION 10/07/2020  Conclusion  Prox RCA to Mid RCA lesion is 80% stenosed.  Ramus-1 lesion is 50% stenosed.  Ramus-2 lesion is 70% stenosed.  1st Mrg lesion is 90% stenosed.  Ost Cx to Prox Cx lesion is 80% stenosed.  Prox LAD lesion is 30% stenosed.  Mid LAD-1 lesion is 70% stenosed.  Mid LAD-2 lesion is 50% stenosed.  The left ventricular ejection fraction is 50-55% by visual estimate.  LV end diastolic pressure is normal.  Significant multivessel coronary calcification and multivessel CAD with calcification of the left main coronary artery, 30% proximal and 70% and 50% mid stenoses; 50% proximal ramus stenosis with 70% mid stenosis; long diffuse high marginal/ramus intermediate like vessel with 90% stenosis, 80% calcified ostial proximal circumflex stenosis with total occlusion of the OM 2 vessel with collateralization, and diffusely diseased 80% small RCA vessel with superior takeoff.  Low normal global LV function with EF estimated 50 to 55% with a subtle area of focal distal inferior hypocontractility.  LVEDP 11 mmHg.  RECOMMENDATION: Recommend surgical consultation for CABG revascularization in this diabetic male with diffuse multivessel stenoses.  Aggressive lipid-lowering therapy with LDL less than 70.  If he is unable to achieve target, recommend PCSK9 inhibition.  The patient will be discharged later today on maximal medical therapy with plan for outpatient surgical consultation for CABG revascularization.  Findings Coronary  Findings Diagnostic  Dominance: Co-dominant  Left Anterior Descending Prox LAD lesion is 30% stenosed. Mid LAD-1 lesion is 70% stenosed. Mid LAD-2 lesion is 50% stenosed.  Ramus Intermedius Ramus-1 lesion is 50% stenosed. Ramus-2 lesion is 70% stenosed.  Left Circumflex Ost Cx to Prox Cx lesion is 80% stenosed.  First Obtuse Marginal Branch 1st Mrg lesion is 90% stenosed.  Right Coronary Artery Vessel is small. Prox RCA to Mid RCA lesion is 80% stenosed.  Intervention  No interventions have been documented.     ECHOCARDIOGRAM  ECHOCARDIOGRAM COMPLETE 10/15/2020  Narrative ECHOCARDIOGRAM REPORT    Patient Name:   Daniel Gibson Date of Exam: 10/15/2020 Medical Rec #:  [id number]         Height:       70.0 in Accession #:    [id number]        Weight:       206.4 lb Date of Birth:  06-29-1950          BSA:          2.115 m Patient Age:    70 years          BP:           132/77 mmHg Patient Gender: M                 HR:           68 bpm. Exam Location:  Outpatient  Procedure: 2D Echo, Cardiac  Doppler and Color Doppler  Indications:    CAD  History:        Patient has no prior history of Echocardiogram examinations. CAD and Previous Myocardial Infarction, Carotid Disease; Risk Factors:Hypertension and Dyslipidemia.  Sonographer:    Lyle Marc Referring Phys: [id number] BROADUS Z ATKINS  IMPRESSIONS   1. Left ventricular ejection fraction, by estimation, is 55 to 60%. The left ventricle has normal function. The left ventricle has no regional wall motion abnormalities. Left ventricular diastolic parameters were normal. 2. Right ventricular systolic function is normal. The right ventricular size is normal. There is normal pulmonary artery systolic pressure. 3. The mitral valve is normal in structure. Trivial mitral valve regurgitation. 4. The aortic valve is normal in structure. Aortic valve regurgitation is not visualized.  FINDINGS Left Ventricle: Left  ventricular ejection fraction, by estimation, is 55 to 60%. The left ventricle has normal function. The left ventricle has no regional wall motion abnormalities. The left ventricular internal cavity size was normal in size. There is no left ventricular hypertrophy. Left ventricular diastolic parameters were normal.  Right Ventricle: The right ventricular size is normal. No increase in right ventricular wall thickness. Right ventricular systolic function is normal. There is normal pulmonary artery systolic pressure. The tricuspid regurgitant velocity is 2.28 m/s, and with an assumed right atrial pressure of 3 mmHg, the estimated right ventricular systolic pressure is 23.8 mmHg.  Left Atrium: Left atrial size was normal in size.  Right Atrium: Right atrial size was normal in size.  Pericardium: There is no evidence of pericardial effusion.  Mitral Valve: The mitral valve is normal in structure. Trivial mitral valve regurgitation.  Tricuspid Valve: The tricuspid valve is grossly normal. Tricuspid valve regurgitation is trivial.  Aortic Valve: The aortic valve is normal in structure. Aortic valve regurgitation is not visualized.  Pulmonic Valve: The pulmonic valve was grossly normal. Pulmonic valve regurgitation is not visualized.  Aorta: The aortic root and ascending aorta are structurally normal, with no evidence of dilitation.  IAS/Shunts: The atrial septum is grossly normal.   LEFT VENTRICLE PLAX 2D LVIDd:         4.90 cm     Diastology LVIDs:         3.60 cm     LV e' medial:    5.00 cm/s LV PW:         1.10 cm     LV E/e' medial:  15.7 LV IVS:        0.90 cm     LV e' lateral:   7.51 cm/s LVOT diam:     2.00 cm     LV E/e' lateral: 10.4 LV SV:         63 LV SV Index:   30 LVOT Area:     3.14 cm  LV Volumes (MOD) LV vol d, MOD A4C: 88.3 ml LV vol s, MOD A4C: 45.1 ml LV SV MOD A4C:     88.3 ml  RIGHT VENTRICLE RV Basal diam:  2.90 cm RV S prime:     14.90 cm/s TAPSE  (M-mode): 2.8 cm  LEFT ATRIUM             Index       RIGHT ATRIUM           Index LA diam:        4.10 cm 1.94 cm/m  RA Area:     13.60 cm LA Vol (A2C):   49.1 ml 23.21 ml/m RA Volume:  33.40 ml  15.79 ml/m LA Vol (A4C):   40.9 ml 19.33 ml/m LA Biplane Vol: 46.5 ml 21.98 ml/m AORTIC VALVE LVOT Vmax:   92.70 cm/s LVOT Vmean:  62.800 cm/s LVOT VTI:    0.202 m  AORTA Ao Root diam: 2.70 cm  MITRAL VALVE               TRICUSPID VALVE MV Area (PHT): 3.21 cm    TR Peak grad:   20.8 mmHg MV Decel Time: 236 msec    TR Vmax:        228.00 cm/s MV E velocity: 78.40 cm/s MV A velocity: 73.70 cm/s  SHUNTS MV E/A ratio:  1.06        Systemic VTI:  0.20 m Systemic Diam: 2.00 cm  Aleene Passe MD Electronically signed by Aleene Passe MD Signature Date/Time: 10/15/2020/11:13:52 AM    Final   TEE  ECHO INTRAOPERATIVE TEE 10/18/2020  Narrative *INTRAOPERATIVE TRANSESOPHAGEAL REPORT *    Patient Name:   Daniel Gibson Date of Exam: 10/18/2020 Medical Rec #:  [id number]         Height:       70.0 in Accession #:    [id number]        Weight:       206.3 lb Date of Birth:  10/24/1950          BSA:          2.12 m Patient Age:    70 years          BP:           150/68 mmHg Patient Gender: M                 HR:           63 bpm. Exam Location:  Anesthesiology  Transesophogeal exam was perform intraoperatively during surgical procedure. Patient was closely monitored under general anesthesia during the entirety of examination.  Indications:     CAD Native Vessel Sonographer:     Augustin Seals RDCS (AE) Performing Phys: [id number] AMNJILD Z ATKINS Diagnosing Phys: Cordella Stoltzfus  Complications: No known complications during this procedure. POST-OP IMPRESSIONS - Left Ventricle: The left ventricle is unchanged from pre-bypass. has normal systolic function. The cavity size was normal. The wall motion is normal. - Right Ventricle: The right ventricle appears unchanged from  pre-bypass. - Aorta: The aorta appears unchanged from pre-bypass. - Left Atrial Appendage: The left atrial appendage appears unchanged from pre-bypass. - Aortic Valve: The aortic valve appears unchanged from pre-bypass. - Mitral Valve: The mitral valve appears unchanged from pre-bypass. - Tricuspid Valve: The tricuspid valve appears unchanged from pre-bypass. - Pulmonic Valve: The pulmonic valve appears unchanged from pre-bypass. - Interatrial Septum: The interatrial septum appears unchanged from pre-bypass. - Pericardium: The pericardium appears unchanged from pre-bypass. - Comments: S/P CABG X 3. Emergence on low dose phenylephrine . No new or worsening wall motion or valvular abnormalities. Preserved ejection fraction.  PRE-OP FINDINGS Left Ventricle: The left ventricle has normal systolic function, with an ejection fraction of 55-60%. The cavity size was normal. No evidence of left ventricular regional wall motion abnormalities. There is no left ventricular hypertrophy. Left ventricular diastolic parameters were normal.   Right Ventricle: The right ventricle has normal systolic function. The cavity was normal. There is no increase in right ventricular wall thickness.  Left Atrium: Left atrial size was normal in size. No left atrial/left atrial appendage thrombus was detected. There is echo contrast  seen in the left atrial cavity. Left atrial appendage velocity is normal at greater than 40 cm/s.  Right Atrium: Right atrial size was normal in size. Prominent Eustachian valve.  Interatrial Septum: No atrial level shunt detected by color flow Doppler. The interatrial septum appears to be lipomatous. There is no evidence of a patent foramen ovale.  Pericardium: There is no evidence of pericardial effusion. There is no evidence of cardiac tamponade. There is no pleural effusion.  Mitral Valve: The mitral valve is normal in structure. Mitral valve regurgitation is trivial by color flow  Doppler. There is no evidence of mitral valve vegetation. There is No evidence of mitral stenosis.  Tricuspid Valve: The tricuspid valve was normal in structure. Tricuspid valve regurgitation is trivial by color flow Doppler. No evidence of tricuspid stenosis is present. There is no evidence of tricuspid valve vegetation.  Aortic Valve: The aortic valve is tricuspid Aortic valve regurgitation was not visualized by color flow Doppler. There is no stenosis of the aortic valve, with a calculated valve area of 2.17 cm. There is no evidence of aortic valve vegetation.   Pulmonic Valve: The pulmonic valve was normal in structure, with normal. No evidence of pumonic stenosis. Pulmonic valve regurgitation is trivial by color flow Doppler.   Aorta: The aortic root and ascending aorta are normal in size and structure.  Pulmonary Artery: Norva Purl catheter present on the right. The pulmonary artery is of normal size.  Venous: The inferior vena cava is normal in size with greater than 50% respiratory variability, suggesting right atrial pressure of 3 mmHg.  Shunts: There is no evidence of an atrial septal defect.  +--------------+--------++ LEFT VENTRICLE         +----------------+---------++ +--------------+--------++ Diastology                PLAX 2D                +----------------+---------++ +--------------+--------++ LV e' lateral:  8.75 cm/s LVIDd:        4.79 cm  +----------------+---------++ +--------------+--------++ LV E/e' lateral:8.2       LVIDs:        3.33 cm  +----------------+---------++ +--------------+--------++ LV e' medial:   7.00 cm/s LVOT diam:    2.20 cm  +----------------+---------++ +--------------+--------++ LV E/e' medial: 10.2      LV SV:        62 ml    +----------------+---------++ +--------------+--------++ LV SV Index:  28.48    +----------------------+-------++ +--------------+--------++ 2D Longitudinal Strain         LVOT Area:    3.80 cm +----------------------+-------++ +--------------+--------++ 2D Strain GLS (A2C):  -24.7 %                        +----------------------+-------++ +--------------+--------++ 2D Strain GLS (A3C):  -15.4 % +----------------------+-------++ 2D Strain GLS (A4C):  -13.1 % +----------------------+-------++ 2D Strain GLS Avg:    -17.7 % +----------------------+-------++  +------------+--------++ 3D Volume EF         +------------+--------++ LV 3D EDV:  63.77 ml +------------+--------++ LV 3D ESV:  28.31 ml +------------+--------++  +---------------+------+-------+ RIGHT VENTRICLE              +---------------+------+-------+ TAPSE (M-mode):1.9 cm2.37 cm +---------------+------+-------+  +------------------+------------++ AORTIC VALVE                   +------------------+------------++ AV Area (Vmax):   2.26 cm     +------------------+------------++ AV Area (Vmean):  2.12 cm     +------------------+------------++  AV Area (VTI):    2.17 cm     +------------------+------------++ AV Vmax:          170.00 cm/s  +------------------+------------++ AV Vmean:         115.000 cm/s +------------------+------------++ AV VTI:           0.430 m      +------------------+------------++ AV Peak Grad:     11.6 mmHg    +------------------+------------++ AV Mean Grad:     6.0 mmHg     +------------------+------------++ LVOT Vmax:        101.00 cm/s  +------------------+------------++ LVOT Vmean:       64.000 cm/s  +------------------+------------++ LVOT VTI:         0.246 m      +------------------+------------++ LVOT/AV VTI ratio:0.57         +------------------+------------++  +-------------+-------++ AORTA                +-------------+-------++ Ao Root diam:2.60 cm +-------------+-------++ Ao STJ diam: 2.4 cm  +-------------+-------++ Ao  Asc diam: 3.20 cm +-------------+-------++  +--------------+----------++ MITRAL VALVE               +--------------+-------+ +--------------+----------++   SHUNTS                MV Area (PHT):3.76 cm     +--------------+-------+ +--------------+----------++   Systemic VTI: 0.25 m  MV Peak grad: 2.2 mmHg     +--------------+-------+ +--------------+----------++   Systemic Diam:2.20 cm MV Mean grad: 1.0 mmHg     +--------------+-------+ +--------------+----------++ MV Vmax:      0.74 m/s   +--------------+----------++ MV Vmean:     42.0 cm/s  +--------------+----------++ MV PHT:       58.58 msec +--------------+----------++ MV Decel Time:202 msec   +--------------+----------++ +--------------+------------++ MV E velocity:71.40 cm/s   +--------------+------------++ MV A velocity:6790.00 cm/s +--------------+------------++ MV E/A ratio: 0.01         +--------------+------------++   Cordella Fix Electronically signed by Cordella Fix Signature Date/Time: 10/18/2020/2:28:43 PM    Final    CT SCANS  CT CORONARY FRACTIONAL FLOW RESERVE DATA PREP 09/29/2020  Narrative EXAM: FFRCT ANALYSIS  FINDINGS: FFRct analysis was performed on the original cardiac CT angiogram dataset. Diagrammatic representation of the FFRct analysis is provided in a separate PDF document in PACS. This dictation was created using the PDF document and an interactive 3D model of the results. 3D model is not available in the EMR/PACS. Normal FFR range is >0.80.  1. Left Main: No significant stenosis modeled  2. LAD: CTFFR 0.71 across lesion in proximal LAD. CTFFR <0.5 across lesion in mid LAD  3. LCX: CTFFR 0.77 across lesion in proximal LCX. CTFFR 0.59 across lesion in distal LCX. Total occlusion of OM1  4. Ramus: CTFFR 0.86 across lesion in proximal ramus. CTFFR 0.65 across lesion in mid ramus.  5. RCA: CTFFR <0.5 across lesion  in proximal RCA  IMPRESSION: 1. Severe multivessel obstructive CAD suggested by CTFFR. Cardiac catheterization is recommended.   Electronically Signed By: Lonni Nanas MD On: 09/29/2020 21:25   CT CORONARY MORPH W/CTA COR W/SCORE 09/29/2020  Addendum 09/29/2020  8:11 PM ADDENDUM REPORT: 09/29/2020 20:09  EXAM: Cardiac/Coronary CTA  TECHNIQUE: The patient was scanned on a [employer].  FINDINGS: A 100 kV prospective scan was triggered in the descending thoracic aorta at 111 HU's. Axial non-contrast 3 mm slices were carried out through the heart. The data set was analyzed on a dedicated work station and scored using the Agatson method.  Gantry rotation speed was 250 msecs and collimation was .6 mm. No beta blockade and 0.8 mg of sl NTG was given. The 3D data set was reconstructed in 5% intervals of the 67-82 % of the R-R cycle. Diastolic phases were analyzed on a dedicated work station using MPR, MIP and VRT modes. The patient received 80 cc of contrast.  Aorta: Normal size.  No calcifications.  No dissection.  Aortic Valve:  Trileaflet.  No calcifications.  Coronary Arteries:  Normal coronary origin.  Right dominance.  RCA is a small nondminant artery. There is calcified plaque in the proximal RCA causing 70-99% stenosis.  Left main is a large artery that gives rise to LAD and LCX arteries. There is calcified plaque in the left main causing 24-49% stenosis  LAD is a large vessel. There is calcified plaque in the proximal LAD causing 50-69% stenosis. There is calcified plaque in the mid LAD causing 50-69% stenosis  LCX is a dominant artery that gives rise to one large OM1 branch. There is calcified plaque in the proximal LCX causing 70-99% stenosis. There is calcified plaque in the mid LCX causing 50-69% stenosis. There is calcified plaque in the distal LCX causing 70-99% stenosis.  There is calcified plaque in ramus causing 70-99%  stenosis  Other findings:  Left Ventricle: Normal size.  Mid inferior akinesis.  Left Atrium: Moderate enlargement  Pulmonary Veins: Normal configuration  Right Ventricle: Normal size  Right Atrium: Mild enlargement  Cardiac valves: No calcifications  Thoracic aorta: Normal size  Pulmonary Arteries: Dilated main PA measuring 33mm  Systemic Veins: Normal drainage  Pericardium: Normal thickness  IMPRESSION: 1. Coronary calcium  score of 896. This was 84th percentile for age and sex matched control.  2. Normal coronary origin with left dominance.  3. Diffuse multivessel coronary calcifications. Degree of stenosis may be overestimated due to severe calcifications  4. Calcified plaque in proximal RCA causes severe (70-99%) stenosis. RCA is small and nondominant  5. Calcified plaque in the distal left main causes mild (24-49%) stenosis  6. Calcified plaque in the proximal LAD causes moderate (50-69%) stenosis. Calcified plaque in the mid LAD causing moderate (50-69%) stenosis  7. Calcified plaque in the proximal LCX causing severe (70-99%) stenosis. Calcified plaque in the mid LCX causes moderate (50-69%) stenosis. Calcified plaque in the distal LCX causes severe(70-99%) stenosis.  8. Akinesis of LV mid inferior wall  9. Will send study for CTFFR  CAD-RADS 4 Severe stenosis. (70-99% or > 50% left main). Cardiac catheterization or CT FFR is recommended. Consider symptom-guided anti-ischemic pharmacotherapy as well as risk factor modification per guideline directed care.   Electronically Signed By: Lonni Nanas MD On: 09/29/2020 20:09  Narrative EXAM: OVER-READ INTERPRETATION  CT CHEST  The following report is an over-read performed by radiologist Dr. Toribio Aye of [facility] on 09/29/2020. This over-read does not include interpretation of cardiac or coronary anatomy or pathology. The coronary calcium  score/coronary  CTA interpretation by the cardiologist is attached.  COMPARISON:  None.  FINDINGS: Aortic atherosclerosis. 2 mm right middle lobe pulmonary nodule (axial image 16 of series 12). Within the visualized portions of the thorax there are no other larger more suspicious appearing pulmonary nodules or masses, there is no acute consolidative airspace disease, no pleural effusions, no pneumothorax and no lymphadenopathy. Visualized portions of the upper abdomen are unremarkable. There are no aggressive appearing lytic or blastic lesions noted in the visualized portions of the skeleton.  IMPRESSION: 1. 2 mm right middle  lobe pulmonary nodule, nonspecific, but statistically likely benign. No follow-up needed if patient is low-risk. Non-contrast chest CT can be considered in 12 months if patient is high-risk. This recommendation follows the consensus statement: Guidelines for Management of Incidental Pulmonary Nodules Detected on CT Images: From the Fleischner Society 2017; Radiology 2017; 284:228-243. 2.  Aortic Atherosclerosis (ICD10-I70.0).  Electronically Signed: By: Toribio Aye M.D. On: 09/29/2020 09:05     ______________________________________________________________________________________________          Recent Labs: 07/17/2024: ALT 12; BUN 13; Creatinine 1.31; Hemoglobin 13.6; Platelet Count 147; Potassium 4.4; Sodium 138  Recent Lipid Panel    Component Value Date/Time   CHOL 103 01/31/2022 1009   TRIG 68 01/31/2022 1009   HDL 46 01/31/2022 1009   CHOLHDL 2.2 01/31/2022 1009   LDLCALC 42 01/31/2022 1009    Physical Exam:    VS:  BP 120/64   Pulse 76   Ht 5' 10 (1.778 m)   Wt 180 lb 6 oz (81.8 kg)   SpO2 97%   BMI 25.88 kg/m     Wt Readings from Last 3 Encounters:  08/01/24 180 lb 6 oz (81.8 kg)  07/17/24 186 lb 8 oz (84.6 kg)  05/21/24 191 lb 9.6 oz (86.9 kg)     GEN: He has lost weight he is beginning to look chronically ill well nourished, well  developed in no acute distress HEENT: Normal NECK: No JVD; No carotid bruits LYMPHATICS: No lymphadenopathy CARDIAC: RRR, no murmurs, rubs, gallops RESPIRATORY:  Clear to auscultation without rales, wheezing or rhonchi  ABDOMEN: Soft, non-tender, non-distended MUSCULOSKELETAL:  No edema; No deformity  SKIN: Warm and dry NEUROLOGIC:  Alert and oriented x 3 PSYCHIATRIC:  Normal affect    Signed, Redell Leiter, MD  08/01/2024 10:19 AM    [facility]      [1]  Current Meds  Medication Sig   amLODipine  (NORVASC ) 10 MG tablet Take 10 mg by mouth in the morning.   carvedilol  (COREG ) 25 MG tablet Take 12.5 mg by mouth 2 (two) times daily with a meal.   dexamethasone  (DECADRON ) 0.5 MG/5ML solution Take 10 mLs (1 mg total) by mouth in the morning, at noon, in the evening, and at bedtime. Swish 10mLs in mouth for two minutes then spit. Do not swallow. Use for first 2 months of therapy.   ELIQUIS  5 MG TABS tablet TAKE 1 TABLET BY MOUTH TWICE A DAY   EPINEPHrine  (EPIPEN  2-PAK) 0.3 mg/0.3 mL IJ SOAJ injection Use as directed for life-threatening allergic reaction.   everolimus  (AFINITOR ) 10 MG tablet Take 1 tablet (10 mg total) by mouth daily.   ezetimibe  (ZETIA ) 10 MG tablet TAKE 1 TABLET BY MOUTH EVERY DAY   fluticasone (FLONASE) 50 MCG/ACT nasal spray Place 2 sprays into both nostrils daily as needed for allergies.   glimepiride (AMARYL) 4 MG tablet Take 4 mg by mouth in the morning and at bedtime.   JARDIANCE 25 MG TABS tablet Take 25 mg by mouth daily.   metFORMIN  (GLUCOPHAGE ) 1000 MG tablet Take 1,000 mg by mouth 2 (two) times daily with a meal.   oxyCODONE -acetaminophen  (PERCOCET) 5-325 MG tablet Take 1 tablet by mouth every 6 (six) hours as needed for moderate pain (pain score 4-6) or severe pain (pain score 7-10) (post-operatively).   pantoprazole  (PROTONIX ) 40 MG tablet TAKE 1 TABLET BY MOUTH EVERY DAY   polyethylene glycol (MIRALAX / GLYCOLAX) 17 g packet Take 17  g by mouth daily as  needed (constipation).   rosuvastatin  (CRESTOR ) 40 MG tablet TAKE 1 TABLET BY MOUTH EVERY DAY   sennosides-docusate sodium  (SENOKOT-S) 8.6-50 MG tablet Take 1 tablet by mouth as needed for constipation.   Tamsulosin  HCl (FLOMAX ) 0.4 MG CAPS Take 0.8 mg by mouth at bedtime.   traZODone  (DESYREL ) 50 MG tablet Take 50 mg by mouth at bedtime as needed for sleep.   Trospium  Chloride 60 MG CP24 Take 1 capsule by mouth daily.   valsartan  (DIOVAN ) 160 MG tablet TAKE 1 TABLET BY MOUTH EVERY DAY   "

## 2024-08-01 ENCOUNTER — Ambulatory Visit

## 2024-08-01 ENCOUNTER — Encounter: Payer: Self-pay | Admitting: Cardiology

## 2024-08-01 ENCOUNTER — Ambulatory Visit: Admitting: Cardiology

## 2024-08-01 VITALS — BP 120/64 | HR 76 | Ht 70.0 in | Wt 180.4 lb

## 2024-08-01 DIAGNOSIS — I493 Ventricular premature depolarization: Secondary | ICD-10-CM

## 2024-08-01 DIAGNOSIS — Z7901 Long term (current) use of anticoagulants: Secondary | ICD-10-CM

## 2024-08-01 DIAGNOSIS — R002 Palpitations: Secondary | ICD-10-CM

## 2024-08-01 DIAGNOSIS — I25119 Atherosclerotic heart disease of native coronary artery with unspecified angina pectoris: Secondary | ICD-10-CM

## 2024-08-01 DIAGNOSIS — I119 Hypertensive heart disease without heart failure: Secondary | ICD-10-CM

## 2024-08-01 DIAGNOSIS — E785 Hyperlipidemia, unspecified: Secondary | ICD-10-CM

## 2024-08-01 NOTE — Patient Instructions (Signed)
 Medication Instructions:  Your physician recommends that you continue on your current medications as directed. Please refer to the Current Medication list given to you today.  *If you need a refill on your cardiac medications before your next appointment, please call your pharmacy*  Lab Work: None If you have labs (blood work) drawn today and your tests are completely normal, you will receive your results only by: MyChart Message (if you have MyChart) OR A paper copy in the mail If you have any lab test that is abnormal or we need to change your treatment, we will call you to review the results.  Testing/Procedures: A zio monitor was ordered today. It will remain on for 14 days. You will then return monitor and event diary in provided box. It takes 1-2 weeks for report to be downloaded and returned to us . We will call you with the results. If monitor falls off or has orange flashing light, please call Zio for further instructions.    Follow-Up: At [facility], you and your health needs are our priority.  As part of our continuing mission to provide you with exceptional heart care, our providers are all part of one team.  This team includes your primary Cardiologist (physician) and Advanced Practice Providers or APPs (Physician Assistants and Nurse Practitioners) who all work together to provide you with the care you need, when you need it.  Your next appointment:   3 month(s)  Provider:   Redell Leiter, MD    We recommend signing up for the patient portal called MyChart.  Sign up information is provided on this After Visit Summary.  MyChart is used to connect with patients for Virtual Visits (Telemedicine).  Patients are able to view lab/test results, encounter notes, upcoming appointments, etc.  Non-urgent messages can be sent to your provider as well.   To learn more about what you can do with MyChart, go to [email or web address].   Other Instructions None

## 2024-08-14 ENCOUNTER — Inpatient Hospital Stay

## 2024-08-15 ENCOUNTER — Inpatient Hospital Stay: Admitting: Hematology and Oncology

## 2024-08-15 ENCOUNTER — Inpatient Hospital Stay: Attending: Physician Assistant

## 2024-10-29 ENCOUNTER — Ambulatory Visit: Admitting: Cardiology
